# Patient Record
Sex: Male | Born: 1945
Health system: Southern US, Community
[De-identification: ages and names within clinical notes are randomized; demographics above are authoritative.]

## PROBLEM LIST (undated history)

## (undated) DIAGNOSIS — E7800 Pure hypercholesterolemia, unspecified: Secondary | ICD-10-CM

## (undated) DIAGNOSIS — G43909 Migraine, unspecified, not intractable, without status migrainosus: Secondary | ICD-10-CM

## (undated) DIAGNOSIS — I7300 Raynaud's syndrome without gangrene: Secondary | ICD-10-CM

## (undated) DIAGNOSIS — M3320 Polymyositis, organ involvement unspecified: Secondary | ICD-10-CM

## (undated) DIAGNOSIS — R251 Tremor, unspecified: Secondary | ICD-10-CM

## (undated) HISTORY — DX: Tremor, unspecified: R25.1

## (undated) HISTORY — DX: Polymyositis, organ involvement unspecified: M33.20

## (undated) HISTORY — DX: Raynaud's syndrome without gangrene: I73.00

## (undated) HISTORY — PX: WRIST SURGERY: SHX841

## (undated) HISTORY — DX: Migraine, unspecified, not intractable, without status migrainosus: G43.909

## (undated) HISTORY — DX: Pure hypercholesterolemia, unspecified: E78.00

## (undated) HISTORY — PX: INGUINAL HERNIA REPAIR: SHX194

## (undated) HISTORY — PX: CARPAL TUNNEL RELEASE: SHX101

---

## 2000-11-11 ENCOUNTER — Encounter: Admission: RE | Admit: 2000-11-11 | Discharge: 2000-11-11 | Payer: Self-pay | Admitting: Internal Medicine

## 2000-11-11 ENCOUNTER — Encounter: Payer: Self-pay | Admitting: Internal Medicine

## 2000-11-24 ENCOUNTER — Ambulatory Visit (HOSPITAL_COMMUNITY): Admission: RE | Admit: 2000-11-24 | Discharge: 2000-11-24 | Payer: Self-pay | Admitting: Internal Medicine

## 2000-11-24 ENCOUNTER — Encounter: Payer: Self-pay | Admitting: Internal Medicine

## 2000-11-30 ENCOUNTER — Ambulatory Visit (HOSPITAL_BASED_OUTPATIENT_CLINIC_OR_DEPARTMENT_OTHER): Admission: RE | Admit: 2000-11-30 | Discharge: 2000-11-30 | Payer: Self-pay | Admitting: General Surgery

## 2002-08-01 ENCOUNTER — Ambulatory Visit (HOSPITAL_BASED_OUTPATIENT_CLINIC_OR_DEPARTMENT_OTHER): Admission: RE | Admit: 2002-08-01 | Discharge: 2002-08-01 | Payer: Self-pay | Admitting: Orthopedic Surgery

## 2004-12-29 ENCOUNTER — Ambulatory Visit: Payer: Self-pay | Admitting: Family Medicine

## 2011-04-23 DIAGNOSIS — E7800 Pure hypercholesterolemia, unspecified: Secondary | ICD-10-CM | POA: Diagnosis not present

## 2011-05-25 DIAGNOSIS — M3320 Polymyositis, organ involvement unspecified: Secondary | ICD-10-CM | POA: Diagnosis not present

## 2011-07-21 DIAGNOSIS — M3320 Polymyositis, organ involvement unspecified: Secondary | ICD-10-CM | POA: Diagnosis not present

## 2011-09-22 DIAGNOSIS — I7300 Raynaud's syndrome without gangrene: Secondary | ICD-10-CM | POA: Diagnosis not present

## 2011-09-22 DIAGNOSIS — M3320 Polymyositis, organ involvement unspecified: Secondary | ICD-10-CM | POA: Diagnosis not present

## 2011-09-23 DIAGNOSIS — D235 Other benign neoplasm of skin of trunk: Secondary | ICD-10-CM | POA: Diagnosis not present

## 2011-09-23 DIAGNOSIS — C44320 Squamous cell carcinoma of skin of unspecified parts of face: Secondary | ICD-10-CM | POA: Diagnosis not present

## 2011-09-23 DIAGNOSIS — L538 Other specified erythematous conditions: Secondary | ICD-10-CM | POA: Diagnosis not present

## 2011-09-23 DIAGNOSIS — L570 Actinic keratosis: Secondary | ICD-10-CM | POA: Diagnosis not present

## 2011-11-09 DIAGNOSIS — I10 Essential (primary) hypertension: Secondary | ICD-10-CM | POA: Diagnosis not present

## 2011-11-09 DIAGNOSIS — E7800 Pure hypercholesterolemia, unspecified: Secondary | ICD-10-CM | POA: Diagnosis not present

## 2011-11-12 DIAGNOSIS — M353 Polymyalgia rheumatica: Secondary | ICD-10-CM | POA: Diagnosis not present

## 2011-11-12 DIAGNOSIS — E7800 Pure hypercholesterolemia, unspecified: Secondary | ICD-10-CM | POA: Diagnosis not present

## 2011-11-12 DIAGNOSIS — Z0000 Encounter for general adult medical examination without abnormal findings: Secondary | ICD-10-CM | POA: Diagnosis not present

## 2011-11-30 DIAGNOSIS — M3320 Polymyositis, organ involvement unspecified: Secondary | ICD-10-CM | POA: Diagnosis not present

## 2012-02-08 DIAGNOSIS — M3320 Polymyositis, organ involvement unspecified: Secondary | ICD-10-CM | POA: Diagnosis not present

## 2012-02-08 DIAGNOSIS — I7300 Raynaud's syndrome without gangrene: Secondary | ICD-10-CM | POA: Diagnosis not present

## 2012-05-12 DIAGNOSIS — Z125 Encounter for screening for malignant neoplasm of prostate: Secondary | ICD-10-CM | POA: Diagnosis not present

## 2012-05-12 DIAGNOSIS — I10 Essential (primary) hypertension: Secondary | ICD-10-CM | POA: Diagnosis not present

## 2012-05-12 DIAGNOSIS — E7800 Pure hypercholesterolemia, unspecified: Secondary | ICD-10-CM | POA: Diagnosis not present

## 2012-05-18 DIAGNOSIS — E7800 Pure hypercholesterolemia, unspecified: Secondary | ICD-10-CM | POA: Diagnosis not present

## 2012-05-18 DIAGNOSIS — M3320 Polymyositis, organ involvement unspecified: Secondary | ICD-10-CM | POA: Diagnosis not present

## 2012-05-18 DIAGNOSIS — N529 Male erectile dysfunction, unspecified: Secondary | ICD-10-CM | POA: Diagnosis not present

## 2012-07-11 DIAGNOSIS — I7300 Raynaud's syndrome without gangrene: Secondary | ICD-10-CM | POA: Diagnosis not present

## 2012-07-11 DIAGNOSIS — M3320 Polymyositis, organ involvement unspecified: Secondary | ICD-10-CM | POA: Diagnosis not present

## 2012-10-03 DIAGNOSIS — M3320 Polymyositis, organ involvement unspecified: Secondary | ICD-10-CM | POA: Diagnosis not present

## 2012-10-03 DIAGNOSIS — I7300 Raynaud's syndrome without gangrene: Secondary | ICD-10-CM | POA: Diagnosis not present

## 2012-11-09 DIAGNOSIS — E7800 Pure hypercholesterolemia, unspecified: Secondary | ICD-10-CM | POA: Diagnosis not present

## 2012-11-09 DIAGNOSIS — M3320 Polymyositis, organ involvement unspecified: Secondary | ICD-10-CM | POA: Diagnosis not present

## 2012-11-14 DIAGNOSIS — M3320 Polymyositis, organ involvement unspecified: Secondary | ICD-10-CM | POA: Diagnosis not present

## 2012-11-14 DIAGNOSIS — E7800 Pure hypercholesterolemia, unspecified: Secondary | ICD-10-CM | POA: Diagnosis not present

## 2012-11-14 DIAGNOSIS — Z125 Encounter for screening for malignant neoplasm of prostate: Secondary | ICD-10-CM | POA: Diagnosis not present

## 2012-11-14 DIAGNOSIS — I10 Essential (primary) hypertension: Secondary | ICD-10-CM | POA: Diagnosis not present

## 2012-11-15 DIAGNOSIS — E7800 Pure hypercholesterolemia, unspecified: Secondary | ICD-10-CM | POA: Diagnosis not present

## 2012-11-15 DIAGNOSIS — M3320 Polymyositis, organ involvement unspecified: Secondary | ICD-10-CM | POA: Diagnosis not present

## 2013-01-03 DIAGNOSIS — M3320 Polymyositis, organ involvement unspecified: Secondary | ICD-10-CM | POA: Diagnosis not present

## 2013-01-03 DIAGNOSIS — I7300 Raynaud's syndrome without gangrene: Secondary | ICD-10-CM | POA: Diagnosis not present

## 2013-03-06 DIAGNOSIS — C44319 Basal cell carcinoma of skin of other parts of face: Secondary | ICD-10-CM | POA: Diagnosis not present

## 2013-03-06 DIAGNOSIS — L570 Actinic keratosis: Secondary | ICD-10-CM | POA: Diagnosis not present

## 2013-03-06 DIAGNOSIS — L738 Other specified follicular disorders: Secondary | ICD-10-CM | POA: Diagnosis not present

## 2013-04-04 DIAGNOSIS — R05 Cough: Secondary | ICD-10-CM | POA: Diagnosis not present

## 2013-04-04 DIAGNOSIS — I7300 Raynaud's syndrome without gangrene: Secondary | ICD-10-CM | POA: Diagnosis not present

## 2013-04-04 DIAGNOSIS — M3320 Polymyositis, organ involvement unspecified: Secondary | ICD-10-CM | POA: Diagnosis not present

## 2013-04-04 DIAGNOSIS — J8410 Pulmonary fibrosis, unspecified: Secondary | ICD-10-CM | POA: Diagnosis not present

## 2013-05-10 DIAGNOSIS — R059 Cough, unspecified: Secondary | ICD-10-CM | POA: Diagnosis not present

## 2013-05-10 DIAGNOSIS — R05 Cough: Secondary | ICD-10-CM | POA: Diagnosis not present

## 2013-05-10 DIAGNOSIS — I7300 Raynaud's syndrome without gangrene: Secondary | ICD-10-CM | POA: Diagnosis not present

## 2013-05-10 DIAGNOSIS — Z125 Encounter for screening for malignant neoplasm of prostate: Secondary | ICD-10-CM | POA: Diagnosis not present

## 2013-05-10 DIAGNOSIS — I10 Essential (primary) hypertension: Secondary | ICD-10-CM | POA: Diagnosis not present

## 2013-05-10 DIAGNOSIS — M3320 Polymyositis, organ involvement unspecified: Secondary | ICD-10-CM | POA: Diagnosis not present

## 2013-05-10 DIAGNOSIS — E7800 Pure hypercholesterolemia, unspecified: Secondary | ICD-10-CM | POA: Diagnosis not present

## 2013-05-17 DIAGNOSIS — M3320 Polymyositis, organ involvement unspecified: Secondary | ICD-10-CM | POA: Diagnosis not present

## 2013-05-17 DIAGNOSIS — I7300 Raynaud's syndrome without gangrene: Secondary | ICD-10-CM | POA: Diagnosis not present

## 2013-05-17 DIAGNOSIS — E7800 Pure hypercholesterolemia, unspecified: Secondary | ICD-10-CM | POA: Diagnosis not present

## 2013-05-17 DIAGNOSIS — I10 Essential (primary) hypertension: Secondary | ICD-10-CM | POA: Diagnosis not present

## 2013-07-10 DIAGNOSIS — R059 Cough, unspecified: Secondary | ICD-10-CM | POA: Diagnosis not present

## 2013-07-10 DIAGNOSIS — M3320 Polymyositis, organ involvement unspecified: Secondary | ICD-10-CM | POA: Diagnosis not present

## 2013-07-10 DIAGNOSIS — I7300 Raynaud's syndrome without gangrene: Secondary | ICD-10-CM | POA: Diagnosis not present

## 2013-07-10 DIAGNOSIS — R05 Cough: Secondary | ICD-10-CM | POA: Diagnosis not present

## 2013-10-03 DIAGNOSIS — I7300 Raynaud's syndrome without gangrene: Secondary | ICD-10-CM | POA: Diagnosis not present

## 2013-10-03 DIAGNOSIS — R059 Cough, unspecified: Secondary | ICD-10-CM | POA: Diagnosis not present

## 2013-10-03 DIAGNOSIS — R05 Cough: Secondary | ICD-10-CM | POA: Diagnosis not present

## 2013-10-03 DIAGNOSIS — M3320 Polymyositis, organ involvement unspecified: Secondary | ICD-10-CM | POA: Diagnosis not present

## 2013-11-09 DIAGNOSIS — I10 Essential (primary) hypertension: Secondary | ICD-10-CM | POA: Diagnosis not present

## 2013-11-09 DIAGNOSIS — M3320 Polymyositis, organ involvement unspecified: Secondary | ICD-10-CM | POA: Diagnosis not present

## 2013-11-09 DIAGNOSIS — R7402 Elevation of levels of lactic acid dehydrogenase (LDH): Secondary | ICD-10-CM | POA: Diagnosis not present

## 2013-11-09 DIAGNOSIS — R7401 Elevation of levels of liver transaminase levels: Secondary | ICD-10-CM | POA: Diagnosis not present

## 2013-11-14 DIAGNOSIS — E7800 Pure hypercholesterolemia, unspecified: Secondary | ICD-10-CM | POA: Diagnosis not present

## 2013-11-14 DIAGNOSIS — I10 Essential (primary) hypertension: Secondary | ICD-10-CM | POA: Diagnosis not present

## 2013-11-14 DIAGNOSIS — Z125 Encounter for screening for malignant neoplasm of prostate: Secondary | ICD-10-CM | POA: Diagnosis not present

## 2013-11-14 DIAGNOSIS — M3320 Polymyositis, organ involvement unspecified: Secondary | ICD-10-CM | POA: Diagnosis not present

## 2014-02-01 DIAGNOSIS — I7300 Raynaud's syndrome without gangrene: Secondary | ICD-10-CM | POA: Diagnosis not present

## 2014-02-01 DIAGNOSIS — M3322 Polymyositis with myopathy: Secondary | ICD-10-CM | POA: Diagnosis not present

## 2014-02-06 DIAGNOSIS — M25569 Pain in unspecified knee: Secondary | ICD-10-CM | POA: Diagnosis not present

## 2014-02-06 DIAGNOSIS — M25561 Pain in right knee: Secondary | ICD-10-CM | POA: Diagnosis not present

## 2014-02-06 DIAGNOSIS — S8991XA Unspecified injury of right lower leg, initial encounter: Secondary | ICD-10-CM | POA: Diagnosis not present

## 2014-05-14 DIAGNOSIS — E780 Pure hypercholesterolemia: Secondary | ICD-10-CM | POA: Diagnosis not present

## 2014-05-14 DIAGNOSIS — Z125 Encounter for screening for malignant neoplasm of prostate: Secondary | ICD-10-CM | POA: Diagnosis not present

## 2014-05-14 DIAGNOSIS — Z0000 Encounter for general adult medical examination without abnormal findings: Secondary | ICD-10-CM | POA: Diagnosis not present

## 2014-05-14 DIAGNOSIS — M3320 Polymyositis, organ involvement unspecified: Secondary | ICD-10-CM | POA: Diagnosis not present

## 2014-05-17 DIAGNOSIS — E780 Pure hypercholesterolemia: Secondary | ICD-10-CM | POA: Diagnosis not present

## 2014-05-17 DIAGNOSIS — M3320 Polymyositis, organ involvement unspecified: Secondary | ICD-10-CM | POA: Diagnosis not present

## 2014-05-17 DIAGNOSIS — R230 Cyanosis: Secondary | ICD-10-CM | POA: Diagnosis not present

## 2014-06-05 DIAGNOSIS — M3322 Polymyositis with myopathy: Secondary | ICD-10-CM | POA: Diagnosis not present

## 2014-06-05 DIAGNOSIS — Z7952 Long term (current) use of systemic steroids: Secondary | ICD-10-CM | POA: Diagnosis not present

## 2014-06-05 DIAGNOSIS — I7300 Raynaud's syndrome without gangrene: Secondary | ICD-10-CM | POA: Diagnosis not present

## 2014-07-04 DIAGNOSIS — X32XXXD Exposure to sunlight, subsequent encounter: Secondary | ICD-10-CM | POA: Diagnosis not present

## 2014-07-04 DIAGNOSIS — L02821 Furuncle of head [any part, except face]: Secondary | ICD-10-CM | POA: Diagnosis not present

## 2014-07-04 DIAGNOSIS — B9689 Other specified bacterial agents as the cause of diseases classified elsewhere: Secondary | ICD-10-CM | POA: Diagnosis not present

## 2014-07-04 DIAGNOSIS — L570 Actinic keratosis: Secondary | ICD-10-CM | POA: Diagnosis not present

## 2014-07-04 DIAGNOSIS — D225 Melanocytic nevi of trunk: Secondary | ICD-10-CM | POA: Diagnosis not present

## 2014-07-04 DIAGNOSIS — L309 Dermatitis, unspecified: Secondary | ICD-10-CM | POA: Diagnosis not present

## 2014-09-03 DIAGNOSIS — I7300 Raynaud's syndrome without gangrene: Secondary | ICD-10-CM | POA: Diagnosis not present

## 2014-09-03 DIAGNOSIS — Z7952 Long term (current) use of systemic steroids: Secondary | ICD-10-CM | POA: Diagnosis not present

## 2014-09-03 DIAGNOSIS — M3322 Polymyositis with myopathy: Secondary | ICD-10-CM | POA: Diagnosis not present

## 2014-11-12 DIAGNOSIS — E780 Pure hypercholesterolemia: Secondary | ICD-10-CM | POA: Diagnosis not present

## 2014-11-12 DIAGNOSIS — M3320 Polymyositis, organ involvement unspecified: Secondary | ICD-10-CM | POA: Diagnosis not present

## 2014-11-15 DIAGNOSIS — I7300 Raynaud's syndrome without gangrene: Secondary | ICD-10-CM | POA: Diagnosis not present

## 2014-11-15 DIAGNOSIS — E780 Pure hypercholesterolemia: Secondary | ICD-10-CM | POA: Diagnosis not present

## 2014-11-15 DIAGNOSIS — M3322 Polymyositis with myopathy: Secondary | ICD-10-CM | POA: Diagnosis not present

## 2014-11-15 DIAGNOSIS — R251 Tremor, unspecified: Secondary | ICD-10-CM | POA: Diagnosis not present

## 2014-12-03 ENCOUNTER — Ambulatory Visit (INDEPENDENT_AMBULATORY_CARE_PROVIDER_SITE_OTHER): Payer: Medicare Other | Admitting: Neurology

## 2014-12-03 ENCOUNTER — Encounter: Payer: Self-pay | Admitting: Neurology

## 2014-12-03 VITALS — BP 109/73 | HR 66 | Ht 70.0 in | Wt 173.0 lb

## 2014-12-03 DIAGNOSIS — G20 Parkinson's disease: Secondary | ICD-10-CM

## 2014-12-03 MED ORDER — RASAGILINE MESYLATE 1 MG PO TABS: 1.0000 mg | ORAL_TABLET | Freq: Every day | ORAL | Status: DC

## 2014-12-03 NOTE — Patient Instructions (Signed)
Alexander Thomas  Parkinson Disease Parkinson disease is a disorder of the central nervous system, which includes the brain and spinal cord. A person with this disease slowly loses the ability to completely control body movements. Within the brain, there is a group of nerve cells (basal ganglia) that help control movement. The basal ganglia are damaged and do not work properly in a person with Parkinson disease. In addition, the basal ganglia produce and use a brain chemical called dopamine. The dopamine chemical sends messages to other parts of the body to control and coordinate body movements. Dopamine levels are low in a person with Parkinson disease. If the dopamine levels are low, then the body does not receive the correct messages it needs to move normally.  CAUSES  The exact reason why the basal ganglia get damaged is not known. Some medical researchers have thought that infection, genes, environment, and certain medicines may contribute to the cause.  SYMPTOMS   An early symptom of Parkinson disease is often an uncontrolled shaking (tremor) of the hands. The tremor will often disappear when the affected hand is consciously used.  As the disease progresses, walking, talking, getting out of a chair, and new movements become more difficult.  Muscles get stiff and movements become slower.  Balance and coordination become harder.  Depression, trouble swallowing, urinary problems, constipation, and sleep problems can occur.  Later in the disease, memory and thought processes may deteriorate. DIAGNOSIS  There are no specific tests to diagnose Parkinson disease. You may be referred to a neurologist for evaluation. Your caregiver will ask about your medical history, symptoms, and perform a physical exam. Blood tests and imaging tests of your brain may be performed to rule out other diseases. The imaging tests may include an MRI or a CT scan. TREATMENT  The goal of treatment is to  relieve symptoms. Medicines may be prescribed once the symptoms become troublesome. Medicine will not stop the progression of the disease, but medicine can make movement and balance better and help control tremors. Speech and occupational therapy may also be prescribed. Sometimes, surgical treatment of the brain can be done in young people. HOME CARE INSTRUCTIONS  Get regular exercise and rest periods during the day to help prevent exhaustion and depression.  If getting dressed becomes difficult, replace buttons and zippers with Velcro and elastic on your clothing.  Take all medicine as directed by your caregiver.  Install grab bars or railings in your home to prevent falls.  Go to speech or occupational therapy as directed.  Keep all follow-up visits as directed by your caregiver. SEEK MEDICAL CARE IF:  Your symptoms are not controlled with your medicine.  You fall.  You have trouble swallowing or choke on your food. MAKE SURE YOU:  Understand these instructions.  Will watch your condition.  Will get help right away if you are not doing well or get worse. Document Released: 04/03/2000 Document Revised: 08/01/2012 Document Reviewed: 05/06/2011 [facility] Patient Information 2015 [place], [place]. This information is not intended to replace advice given to you by your health care provider. Make sure you discuss any questions you have with your health care provider.

## 2014-12-03 NOTE — Progress Notes (Signed)
PATIENT: Alexander Thomas DOB: 1945/12/26  Chief Complaint  Patient presents with  . Tremors    He is here with his wife, Daylene Katayama, to discuss the tremors in his right-hand.  Reports symptoms have been getting worse over the last two years.     HISTORICAL  Alexander Thomas is a 69 years old right-handed male, accompanied by his wife, seen in refer by his primary care physician Dr. Jani Gravel for evaluation of right hand tremor  I have reviewed and summarized his most recent office note He had a past medical history of hyperlipidemia, Raynaud's disease,, had left carpal tunnel release surgery, right wrist fracture surgery, polymyositis,  left deltoid muscle biopsy consistent with fiber atrophy, paravasculitis in 2002, this was done in August 2002, at its worst, he has difficulty standing up, need wife help him dressing, he has taken methotrexate along with low-dose prednisone for many years, in 2015, he was switched to Imuran 50 mg twice a day, most recent CPK level was 83, he denies significant muscle weakness,  I also reviewed laboratory results in July 2016, normal CMP, CBC, CPK 83, mild elevated LDL 134, normal ESR 6, hepatitis panel was negative  He is a retired Company secretary, since 2014, he noticed mild right hand tremor, getting worse gradually, there was no significant gait difficulty, mild left of facial expression, he has lack of smell, which has been a chronic issues, he denies REM sleep disorder, mild constipations.  REVIEW OF SYSTEMS: Full 14 system review of systems performed and notable only for tremor  ALLERGIES: No Known Allergies  HOME MEDICATIONS: Current Outpatient Prescriptions  Medication Sig Dispense Refill  . azaTHIOprine (IMURAN) 50 MG tablet Take 100 mg by mouth daily.  3  . doxycycline (VIBRAMYCIN) 100 MG capsule TAKE ONE CAPSULE BY MOUTH UP TO TWICE DAY PRN WITH FOOD/ WATER. MAKES SKIN SENSITIVE TO SUN  3  . predniSONE (DELTASONE) 1 MG tablet Take 1 mg by mouth  daily with breakfast.    . sildenafil (VIAGRA) 50 MG tablet Take 50 mg by mouth as needed for erectile dysfunction.       PAST MEDICAL HISTORY: Past Medical History  Diagnosis Date  . Polymyositis   . Raynaud disease   . Hypercholesteremia   . Migraine   . Tremor of right hand     PAST SURGICAL HISTORY: Past Surgical History  Procedure Laterality Date  . Carpal tunnel release Left   . Inguinal hernia repair Bilateral   . Wrist surgery Right     FAMILY HISTORY: Family History  Problem Relation Age of Onset  . Uterine cancer Mother   . Healthy Father   . Kidney cancer Brother   . Colon cancer Brother     SOCIAL HISTORY:  Social History   Social History  . Marital Status: Married    Spouse Name: N/A  . Number of Children: 2  . Years of Education: PhD   Occupational History  . Minister    Social History Main Topics  . Smoking status: Never Smoker   . Smokeless tobacco: Not on file  . Alcohol Use: No  . Drug Use: No  . Sexual Activity: Not on file   Other Topics Concern  . Not on file   Social History Narrative   Lives at home with his wife.   Right-handed.   1 cup caffeine daily.     PHYSICAL EXAM   Filed Vitals:   12/03/14 1446  BP: 109/73  Pulse:  66  Height: 5' 10"  (1.778 m)  Weight: 173 lb (78.472 kg)    Not recorded      Body mass index is 24.82 kg/(m^2).  PHYSICAL EXAMNIATION:  Gen: NAD, conversant, well nourised, obese, well groomed                     Cardiovascular: Regular rate rhythm, no peripheral edema, warm, nontender. Eyes: Conjunctivae clear without exudates or hemorrhage Neck: Supple, no carotid bruise. Pulmonary: Clear to auscultation bilaterally   NEUROLOGICAL EXAM:  MENTAL STATUS: Speech:    Speech is normal; fluent and spontaneous with normal comprehension.  Cognition:     Orientation to time, place and person     Normal recent and remote memory     Normal Attention span and concentration     Normal  Language, naming, repeating,spontaneous speech     Fund of knowledge   CRANIAL NERVES: CN II: Visual fields are full to confrontation. Fundoscopic exam is normal with sharp discs and no vascular changes. Pupils are round equal and briskly reactive to light. CN III, IV, VI: extraocular movement are normal. No ptosis. CN V: Facial sensation is intact to pinprick in all 3 divisions bilaterally. Corneal responses are intact.  CN VII: Face is symmetric with normal eye closure and smile. CN VIII: Hearing is normal to rubbing fingers CN IX, X: Palate elevates symmetrically. Phonation is normal. CN XI: Head turning and shoulder shrug are intact CN XII: Tongue is midline with normal movements and no atrophy.  MOTOR: Right hand resting tremor, moderate more than left side rigidity, bradykinesia, mild nuchal rigidity  REFLEXES: Reflexes are 2+ and symmetric at the biceps, triceps, knees, and ankles. Plantar responses are flexor.  SENSORY: Intact to light touch, pinprick, position sense, and vibration sense are intact in fingers and toes.  COORDINATION: Rapid alternating movements and fine finger movements are intact. There is no dysmetria on finger-to-nose and heel-knee-shin.    GAIT/STANCE: Posture is normal. Gait is steady with normal steps, base, decreased right arm swing, slight retropulsed instability .  Romberg is absent.   DIAGNOSTIC DATA (LABS, IMAGING, TESTING) - I reviewed patient records, labs, notes, testing and imaging myself where available.   ASSESSMENT AND PLAN  Alexander Thomas is a 69 y.o. male    Idiopathic Parkinson's disease  MRI of brain  Azilect 1 mg daily  I have encouraged him moderate exercise History of polymyositis   no weakness, well controlled on Imuran 50 mg twice a day, prednisone 1 mg every day, most recent CPK 83   Marcial Pacas, M.D. Ph.D.  [facility] [address], [place], [place] [address] Ph: [phone] Fax:  [phone]  CC: Dr. Jani Gravel

## 2014-12-10 ENCOUNTER — Ambulatory Visit
Admission: RE | Admit: 2014-12-10 | Discharge: 2014-12-10 | Disposition: A | Discharge status: Home / Self Care | Payer: Medicare Other | Source: Ambulatory Visit | Attending: Neurology | Admitting: Neurology

## 2014-12-10 ENCOUNTER — Telehealth: Payer: Self-pay | Admitting: Neurology

## 2014-12-10 DIAGNOSIS — G20 Parkinson's disease: Secondary | ICD-10-CM | POA: Diagnosis not present

## 2014-12-10 DIAGNOSIS — G20A1 Parkinson's disease without dyskinesia, without mention of fluctuations: Secondary | ICD-10-CM

## 2014-12-10 DIAGNOSIS — R9082 White matter disease, unspecified: Secondary | ICD-10-CM | POA: Diagnosis not present

## 2014-12-10 NOTE — Telephone Encounter (Signed)
Michelle: Please call patient MRI of the brain without contrast showed mild age-related changes, I will reviewed detail with him at his next follow-up visit   IMPRESSION: This MRI of the brain without contrast shows a few scattered T2/FLAIR hyperintense foci in the white matter. This is a nonspecific finding and could be idiopathic or due to mild age related chronic microvascular ischemic changes. Demyelination and vasculitis would be less likely.

## 2014-12-11 ENCOUNTER — Other Ambulatory Visit: Payer: Self-pay

## 2014-12-11 NOTE — Telephone Encounter (Signed)
Spoke to patient - he is aware of the results. 

## 2015-02-04 ENCOUNTER — Ambulatory Visit: Payer: Self-pay | Admitting: Neurology

## 2015-02-04 ENCOUNTER — Ambulatory Visit (INDEPENDENT_AMBULATORY_CARE_PROVIDER_SITE_OTHER): Payer: Medicare Other | Admitting: Neurology

## 2015-02-04 ENCOUNTER — Encounter: Payer: Self-pay | Admitting: Neurology

## 2015-02-04 VITALS — BP 112/70 | HR 68 | Ht 70.0 in | Wt 175.0 lb

## 2015-02-04 DIAGNOSIS — M3322 Polymyositis with myopathy: Secondary | ICD-10-CM

## 2015-02-04 DIAGNOSIS — G20 Parkinson's disease: Secondary | ICD-10-CM

## 2015-02-04 MED ORDER — ROPINIROLE HCL ER 2 MG PO TB24: ORAL_TABLET | ORAL | Status: DC

## 2015-02-04 NOTE — Progress Notes (Signed)
Chief Complaint  Patient presents with  . Parkinson's Disease    He is here with his wife, Alexander Thomas.  They would like to review his MRI.  He has not noticed any improvment of his symptoms with Azilect after nine weeks of use.      PATIENT: Alexander Thomas DOB: Feb 13, 1946  Chief Complaint  Patient presents with  . Parkinson's Disease    He is here with his wife, Alexander Thomas.  They would like to review his MRI.  He has not noticed any improvment of his symptoms with Azilect after nine weeks of use.     HISTORICAL  Alexander Thomas is a 69 years old right-handed male, accompanied by his wife, seen in refer by his primary care physician Dr. Jani Gravel for evaluation of right hand tremor  I have reviewed and summarized his most recent office note He had a past medical history of hyperlipidemia, Raynaud's disease,, had left carpal tunnel release surgery, right wrist fracture surgery, polymyositis,  left deltoid muscle biopsy consistent with fiber atrophy, paravasculitis in 2002, this was done in August 2002, at its worst, he has difficulty standing up, need wife help him dressing, he has taken methotrexate along with low-dose prednisone for many years, in 2015, he was switched to Imuran 50 mg twice a day, most recent CPK level was 83, he denies significant muscle weakness,  I also reviewed laboratory results in July 2016, normal CMP, CBC, CPK 83, mild elevated LDL 134, normal ESR 6, hepatitis panel was negative  He is a retired Company secretary, since 2014, he noticed mild right hand tremor, getting worse gradually, there was no significant gait difficulty, mild lack of facial expression, he has lack of smell, which has been a chronic issues, he denies REM sleep disorder, mild constipations.  Update February 04 2015: He has no side effect with Azilect, does not notice any symptomatic improvement either,  We have reviewed MRI of the brain without contrast, mild supratentorium small vessel disease, no acute  lesions.  REVIEW OF SYSTEMS: Full 14 system review of systems performed and notable only for tremor  ALLERGIES: No Known Allergies  HOME MEDICATIONS: Current Outpatient Prescriptions  Medication Sig Dispense Refill  . azaTHIOprine (IMURAN) 50 MG tablet Take 100 mg by mouth daily.  3  . doxycycline (VIBRAMYCIN) 100 MG capsule TAKE ONE CAPSULE BY MOUTH UP TO TWICE DAY PRN WITH FOOD/ WATER. MAKES SKIN SENSITIVE TO SUN  3  . predniSONE (DELTASONE) 1 MG tablet Take 1 mg by mouth daily with breakfast.    . sildenafil (VIAGRA) 50 MG tablet Take 50 mg by mouth as needed for erectile dysfunction.       PAST MEDICAL HISTORY: Past Medical History  Diagnosis Date  . Polymyositis ([place])   . Raynaud disease   . Hypercholesteremia   . Migraine   . Tremor of right hand     PAST SURGICAL HISTORY: Past Surgical History  Procedure Laterality Date  . Carpal tunnel release Left   . Inguinal hernia repair Bilateral   . Wrist surgery Right     FAMILY HISTORY: Family History  Problem Relation Age of Onset  . Uterine cancer Mother   . Healthy Father   . Kidney cancer Brother   . Colon cancer Brother     SOCIAL HISTORY:  Social History   Social History  . Marital Status: Married    Spouse Name: N/A  . Number of Children: 2  . Years of Education: PhD   Occupational  History  . Minister    Social History Main Topics  . Smoking status: Never Smoker   . Smokeless tobacco: Not on file  . Alcohol Use: No  . Drug Use: No  . Sexual Activity: Not on file   Other Topics Concern  . Not on file   Social History Narrative   Lives at home with his wife.   Right-handed.   1 cup caffeine daily.     PHYSICAL EXAM   Filed Vitals:   02/04/15 1140  BP: 112/70  Pulse: 68  Height: 5' 10"  (1.778 m)  Weight: 175 lb (79.379 kg)    Not recorded      Body mass index is 25.11 kg/(m^2).  PHYSICAL EXAMNIATION:  Gen: NAD, conversant, well nourised, obese, well groomed                      Cardiovascular: Regular rate rhythm, no peripheral edema, warm, nontender. Eyes: Conjunctivae clear without exudates or hemorrhage Neck: Supple, no carotid bruise. Pulmonary: Clear to auscultation bilaterally   NEUROLOGICAL EXAM:  MENTAL STATUS: Speech:    Speech is normal; fluent and spontaneous with normal comprehension.  Cognition:     Orientation to time, place and person     Normal recent and remote memory     Normal Attention span and concentration     Normal Language, naming, repeating,spontaneous speech     Fund of knowledge   CRANIAL NERVES: CN II: Visual fields are full to confrontation. Fundoscopic exam is normal with sharp discs and no vascular changes. Pupils are round equal and briskly reactive to light. CN III, IV, VI: extraocular movement are normal. No ptosis. CN V: Facial sensation is intact to pinprick in all 3 divisions bilaterally. Corneal responses are intact.  CN VII: Face is symmetric with normal eye closure and smile. CN VIII: Hearing is normal to rubbing fingers CN IX, X: Palate elevates symmetrically. Phonation is normal. CN XI: Head turning and shoulder shrug are intact CN XII: Tongue is midline with normal movements and no atrophy.  MOTOR: Right hand resting tremor, moderate more than left side rigidity, bradykinesia, mild nuchal rigidity, he has no weakness  REFLEXES: Reflexes are 2+ and symmetric at the biceps, triceps, knees, and ankles. Plantar responses are flexor.  SENSORY: Intact to light touch, pinprick, position sense, and vibration sense are intact in fingers and toes.  COORDINATION: Rapid alternating movements and fine finger movements are intact. There is no dysmetria on finger-to-nose and heel-knee-shin.    GAIT/STANCE: Posture is normal. Gait is steady with normal steps, base, decreased right arm swing, increased right hand tremor while ambulating, slight retropulsed instability .  Romberg is absent.   DIAGNOSTIC DATA (LABS,  IMAGING, TESTING) - I reviewed patient records, labs, notes, testing and imaging myself where available.   ASSESSMENT AND PLAN  Alexander Thomas is a 69 y.o. male    Idiopathic Parkinson's disease  Azilect 1 mg daily  Add on Requip XL 26m up to 2 tabs po qhs.  I have encouraged him moderate exercise  History of polymyositis   no weakness, well controlled on Imuran 50 mg twice a day, prednisone 1 mg every day, most recent CPK 83  If continues to do well, will consider tapering him of prednisone.  YMarcial Pacas M.D. Ph.D.  [facility] [address] [place] [address]Ph: [phone]: [phone] CC: Dr. JJani Gravel

## 2015-04-08 ENCOUNTER — Telehealth: Payer: Self-pay | Admitting: Neurology

## 2015-04-08 NOTE — Telephone Encounter (Signed)
Patient is calling and states his Rx Rasagiline 1 mg cost him $193 copay today.  Is there another medication that would work for him.  Please call.

## 2015-04-08 NOTE — Telephone Encounter (Signed)
I called back and spoke with the patient.  Asked that we disregard the call.  Says he found out he is in the donut hole, which caused the price to increase this month.  He will call us back after the beginning of the year if the cost does not reduce to discuss other options.

## 2015-04-29 DIAGNOSIS — I781 Nevus, non-neoplastic: Secondary | ICD-10-CM | POA: Diagnosis not present

## 2015-04-29 DIAGNOSIS — L820 Inflamed seborrheic keratosis: Secondary | ICD-10-CM | POA: Diagnosis not present

## 2015-04-29 DIAGNOSIS — D225 Melanocytic nevi of trunk: Secondary | ICD-10-CM | POA: Diagnosis not present

## 2015-04-29 DIAGNOSIS — L259 Unspecified contact dermatitis, unspecified cause: Secondary | ICD-10-CM | POA: Diagnosis not present

## 2015-05-07 ENCOUNTER — Encounter: Payer: Self-pay | Admitting: Neurology

## 2015-05-07 ENCOUNTER — Ambulatory Visit (INDEPENDENT_AMBULATORY_CARE_PROVIDER_SITE_OTHER): Payer: Medicare Other | Admitting: Neurology

## 2015-05-07 VITALS — BP 116/71 | HR 73 | Ht 70.0 in | Wt 179.0 lb

## 2015-05-07 DIAGNOSIS — G20 Parkinson's disease: Secondary | ICD-10-CM | POA: Diagnosis not present

## 2015-05-07 DIAGNOSIS — M3320 Polymyositis, organ involvement unspecified: Secondary | ICD-10-CM | POA: Diagnosis not present

## 2015-05-07 MED ORDER — ROPINIROLE HCL ER 4 MG PO TB24: 8.0000 mg | ORAL_TABLET | Freq: Every day | ORAL | Status: DC

## 2015-05-07 NOTE — Progress Notes (Signed)
Chief Complaint  Patient presents with  . Parkinson's Disease    Feels his right-hand tremor is still a problem for him.  His left-hand is better.  He is currently taking Azilect 60m qd and Requip XL 466mqhs.   Chief Complaint  Patient presents with  . Parkinson's Disease    Feels his right-hand tremor is still a problem for him.  His left-hand is better.  He is currently taking Azilect 6m72md and Requip XL 4mg50ms.      PATIENT: Alexander Thomas: [birth year]ief Complaint  Patient presents with  . Parkinson's Disease    Feels his right-hand tremor is still a problem for him.  His left-hand is better.  He is currently taking Azilect 6mg 76mand Requip XL 4mg q28m     HISTORICAL  Alexander CORDREY68 yea85 old right-handed male, accompanied by his wife, seen in refer by his primary care physician Dr. James Jani Gravelvaluation of right hand tremor  I have reviewed and summarized his most recent office note He had a past medical history of hyperlipidemia, Raynaud's disease,, had left carpal tunnel release surgery, right wrist fracture surgery, polymyositis,  left deltoid muscle biopsy consistent with fiber atrophy, paravasculitis in 2002, this was done in August 2002, at its worst, he has difficulty standing up, need wife help him dressing, he has taken methotrexate along with low-dose prednisone for many years, in 2015, he was switched to Imuran 50 mg twice a day, most recent CPK level was 83, he denies significant muscle weakness,  I also reviewed laboratory results in July 2016, normal CMP, CBC, CPK 83, mild elevated LDL 134, normal ESR 6, hepatitis panel was negative  He is a retired ministCompany secretarye 2014, he noticed mild right hand tremor, getting worse gradually, there was no significant gait difficulty, mild lack of facial expression, he has lack of smell, which has been a chronic issues, he denies REM sleep disorder, mild constipations.  Update February 04 2015: He has no  side effect with Azilect, does not notice any symptomatic improvement either,  We have reviewed MRI of the brain without contrast, mild supratentorium small vessel disease, no acute lesions.  UPDATE May 07 2015: He still has mild right hand shaking, left hand is less, he is now taking Azilect 1 mg every day, requip xr 2 mg 2 tablets every night at 8pm.  He did not noticed any significant side effect.  It does help his tremor  He denies muscle weakness or muscle pain, he had a history of inflammatory polymyositis  REVIEW OF SYSTEMS: Full 14 system review of systems performed and notable only for tremor  ALLERGIES: No Known Allergies  HOME MEDICATIONS: Current Outpatient Prescriptions  Medication Sig Dispense Refill  . azaTHIOprine (IMURAN) 50 MG tablet Take 100 mg by mouth daily.  3  . doxycycline (VIBRAMYCIN) 100 MG capsule TAKE ONE CAPSULE BY MOUTH UP TO TWICE DAY PRN WITH FOOD/ WATER. MAKES SKIN SENSITIVE TO SUN  3  . predniSONE (DELTASONE) 1 MG tablet Take 1 mg by mouth daily with breakfast.    . sildenafil (VIAGRA) 50 MG tablet Take 50 mg by mouth as needed for erectile dysfunction.       PAST MEDICAL HISTORY: Past Medical History  Diagnosis Date  . Polymyositis ([facility])  EmeraldRaynaud disease   . Hypercholesteremia   . Migraine   . Tremor of right hand     PAST SURGICAL HISTORY: Past  Surgical History  Procedure Laterality Date  . Carpal tunnel release Left   . Inguinal hernia repair Bilateral   . Wrist surgery Right     FAMILY HISTORY: Family History  Problem Relation Age of Onset  . Uterine cancer Mother   . Healthy Father   . Kidney cancer Brother   . Colon cancer Brother     SOCIAL HISTORY:  Social History   Social History  . Marital Status: Married    Spouse Name: N/A  . Number of Children: 2  . Years of Education: PhD   Occupational History  . Minister    Social History Main Topics  . Smoking status: Never Smoker   . Smokeless tobacco: Not on file   . Alcohol Use: No  . Drug Use: No  . Sexual Activity: Not on file   Other Topics Concern  . Not on file   Social History Narrative   Lives at home with his wife.   Right-handed.   1 cup caffeine daily.     PHYSICAL EXAM   Filed Vitals:   05/07/15 1138  BP: 116/71  Pulse: 73  Height: 5' 10"  (1.778 m)  Weight: 179 lb (81.194 kg)    Not recorded      Body mass index is 25.68 kg/(m^2).  PHYSICAL EXAMNIATION:  Gen: NAD, conversant, well nourised, obese, well groomed                     Cardiovascular: Regular rate rhythm, no peripheral edema, warm, nontender. Eyes: Conjunctivae clear without exudates or hemorrhage Neck: Supple, no carotid bruise. Pulmonary: Clear to auscultation bilaterally   NEUROLOGICAL EXAM:  MENTAL STATUS: Speech:    Speech is normal; fluent and spontaneous with normal comprehension.  Cognition:     Orientation to time, place and person     Normal recent and remote memory     Normal Attention span and concentration     Normal Language, naming, repeating,spontaneous speech     Fund of knowledge   CRANIAL NERVES: CN II: Visual fields are full to confrontation.  Pupils are round equal and briskly reactive to light. CN III, IV, VI: extraocular movement are normal. No ptosis. CN V: Facial sensation is intact to pinprick in all 3 divisions bilaterally. Corneal responses are intact.  CN VII: Face is symmetric with normal eye closure and smile. CN VIII: Hearing is normal to rubbing fingers CN IX, X: Palate elevates symmetrically. Phonation is normal. CN XI: Head turning and shoulder shrug are intact CN XII: Tongue is midline with normal movements and no atrophy.  MOTOR: Right hand resting tremor, moderate more than left side rigidity, bradykinesia, mild nuchal rigidity, he has no weakness  REFLEXES: Reflexes are 2+ and symmetric at the biceps, triceps, knees, and ankles. Plantar responses are flexor.  SENSORY: Intact to light touch,  pinprick, position sense, and vibration sense are intact in fingers and toes.  COORDINATION: Rapid alternating movements and fine finger movements are intact. There is no dysmetria on finger-to-nose and heel-knee-shin.    GAIT/STANCE: Posture is normal. Gait is steady with normal steps, base, decreased right arm swing, increased right hand tremor while ambulating, slight retropulsed instability .  Romberg is absent.   DIAGNOSTIC DATA (LABS, IMAGING, TESTING) - I reviewed patient records, labs, notes, testing and imaging myself where available.   ASSESSMENT AND PLAN  Alexander Thomas is a 70 y.o. male    Idiopathic Parkinson's disease  Keep Azilect 1 mg daily  increase Requip XL to 5m 2 tabs po qhs.  I have encouraged him moderate exercise  History of polymyositis   no weakness, well controlled on Imuran 50 mg twice a day, prednisone 1 mg every day, most recent CPK 83  If continues to do well, will consider tapering him of prednisone.  YMarcial Pacas M.D. Ph.D.  [facility] [address] [place]  [address]Ph: [phone]: [phone] CC: Dr. JJani Gravel

## 2015-05-14 DIAGNOSIS — I10 Essential (primary) hypertension: Secondary | ICD-10-CM | POA: Diagnosis not present

## 2015-05-14 DIAGNOSIS — Z125 Encounter for screening for malignant neoplasm of prostate: Secondary | ICD-10-CM | POA: Diagnosis not present

## 2015-05-14 DIAGNOSIS — M3320 Polymyositis, organ involvement unspecified: Secondary | ICD-10-CM | POA: Diagnosis not present

## 2015-05-14 DIAGNOSIS — E7800 Pure hypercholesterolemia, unspecified: Secondary | ICD-10-CM | POA: Diagnosis not present

## 2015-05-21 DIAGNOSIS — I4891 Unspecified atrial fibrillation: Secondary | ICD-10-CM | POA: Diagnosis not present

## 2015-05-21 DIAGNOSIS — I10 Essential (primary) hypertension: Secondary | ICD-10-CM | POA: Diagnosis not present

## 2015-05-21 DIAGNOSIS — G20 Parkinson's disease: Secondary | ICD-10-CM | POA: Diagnosis not present

## 2015-05-21 DIAGNOSIS — M3320 Polymyositis, organ involvement unspecified: Secondary | ICD-10-CM | POA: Diagnosis not present

## 2015-06-04 DIAGNOSIS — I480 Paroxysmal atrial fibrillation: Secondary | ICD-10-CM | POA: Insufficient documentation

## 2015-06-04 DIAGNOSIS — Z6826 Body mass index (BMI) 26.0-26.9, adult: Secondary | ICD-10-CM | POA: Diagnosis not present

## 2015-06-04 DIAGNOSIS — I4891 Unspecified atrial fibrillation: Secondary | ICD-10-CM | POA: Diagnosis not present

## 2015-06-10 DIAGNOSIS — X32XXXD Exposure to sunlight, subsequent encounter: Secondary | ICD-10-CM | POA: Diagnosis not present

## 2015-06-10 DIAGNOSIS — L219 Seborrheic dermatitis, unspecified: Secondary | ICD-10-CM | POA: Diagnosis not present

## 2015-06-10 DIAGNOSIS — L0202 Furuncle of face: Secondary | ICD-10-CM | POA: Diagnosis not present

## 2015-06-10 DIAGNOSIS — D045 Carcinoma in situ of skin of trunk: Secondary | ICD-10-CM | POA: Diagnosis not present

## 2015-06-10 DIAGNOSIS — L570 Actinic keratosis: Secondary | ICD-10-CM | POA: Diagnosis not present

## 2015-06-10 DIAGNOSIS — B9689 Other specified bacterial agents as the cause of diseases classified elsewhere: Secondary | ICD-10-CM | POA: Diagnosis not present

## 2015-06-18 DIAGNOSIS — I480 Paroxysmal atrial fibrillation: Secondary | ICD-10-CM | POA: Diagnosis not present

## 2015-06-18 DIAGNOSIS — I4891 Unspecified atrial fibrillation: Secondary | ICD-10-CM | POA: Diagnosis not present

## 2015-07-01 DIAGNOSIS — I7300 Raynaud's syndrome without gangrene: Secondary | ICD-10-CM | POA: Diagnosis not present

## 2015-07-01 DIAGNOSIS — Z79899 Other long term (current) drug therapy: Secondary | ICD-10-CM | POA: Diagnosis not present

## 2015-07-01 DIAGNOSIS — M3322 Polymyositis with myopathy: Secondary | ICD-10-CM | POA: Diagnosis not present

## 2015-09-24 DIAGNOSIS — I7300 Raynaud's syndrome without gangrene: Secondary | ICD-10-CM | POA: Diagnosis not present

## 2015-09-24 DIAGNOSIS — E7800 Pure hypercholesterolemia, unspecified: Secondary | ICD-10-CM | POA: Diagnosis not present

## 2015-09-24 DIAGNOSIS — R251 Tremor, unspecified: Secondary | ICD-10-CM | POA: Diagnosis not present

## 2015-09-24 DIAGNOSIS — M545 Low back pain: Secondary | ICD-10-CM | POA: Diagnosis not present

## 2015-09-24 DIAGNOSIS — M3320 Polymyositis, organ involvement unspecified: Secondary | ICD-10-CM | POA: Diagnosis not present

## 2015-11-04 ENCOUNTER — Encounter: Payer: Self-pay | Admitting: Neurology

## 2015-11-04 ENCOUNTER — Ambulatory Visit (INDEPENDENT_AMBULATORY_CARE_PROVIDER_SITE_OTHER): Payer: Medicare Other | Admitting: Neurology

## 2015-11-04 VITALS — BP 102/64 | HR 72 | Ht 70.0 in | Wt 175.0 lb

## 2015-11-04 DIAGNOSIS — G20A1 Parkinson's disease without dyskinesia, without mention of fluctuations: Secondary | ICD-10-CM

## 2015-11-04 DIAGNOSIS — G20 Parkinson's disease: Secondary | ICD-10-CM | POA: Diagnosis not present

## 2015-11-04 DIAGNOSIS — M3320 Polymyositis, organ involvement unspecified: Secondary | ICD-10-CM

## 2015-11-04 MED ORDER — ROPINIROLE HCL ER 4 MG PO TB24: 16.0000 mg | ORAL_TABLET | Freq: Every day | ORAL | Status: DC

## 2015-11-04 MED ORDER — AZATHIOPRINE 50 MG PO TABS: 100.0000 mg | ORAL_TABLET | Freq: Every day | ORAL | Status: DC

## 2015-11-04 MED ORDER — RASAGILINE MESYLATE 1 MG PO TABS: 1.0000 mg | ORAL_TABLET | Freq: Every day | ORAL | Status: DC

## 2015-11-04 NOTE — Progress Notes (Signed)
Chief Complaint  Patient presents with  . Parkinson's Disease    He is here with his wife, Daylene Katayama.  Says his tremors are well controlled when he takes his medications correctly.  He sometimes forgets to take his Requip at night.  His eating habits have been the same but he has lost five pounds since last seen.         PATIENT: Alexander Thomas DOB: [birth year]  Chief Complaint  Patient presents with  . Parkinson's Disease    He is here with his wife, Daylene Katayama.  Says his tremors are well controlled when he takes his medications correctly.  He sometimes forgets to take his Requip at night.  His eating habits have been the same but he has lost five pounds since last seen.        HISTORICAL  JSHAUN ABERNATHY is a 70 years old right-handed male, accompanied by his wife, seen in refer by his primary care physician Dr. Jani Gravel for evaluation of right hand tremor  I have reviewed and summarized his most recent office note He had a past medical history of hyperlipidemia, Raynaud's disease,, had left carpal tunnel release surgery, right wrist fracture surgery, polymyositis,  left deltoid muscle biopsy consistent with fiber atrophy, paravasculitis in 2002, this was diagnosed in August 2002, at its worst, he has difficulty standing up, need wife help him dressing, he has taken methotrexate along with low-dose prednisone for many years, in 2015, he was switched to Imuran 50 mg twice a day, most recent CPK level was 83, he denies significant muscle weakness,  I also reviewed laboratory results in July 2016, normal CMP, CBC, CPK 83, mild elevated LDL 134, normal ESR 6, hepatitis panel was negative  He is a retired Company secretary, since 2014, he noticed mild right hand tremor, getting worse gradually, there was no significant gait difficulty, mild lack of facial expression, he has lack of smell, which has been a chronic issues, he denies REM sleep disorder, mild constipations.  Update February 04 2015: He has  no side effect with Azilect, does not notice any symptomatic improvement either,  We have reviewed MRI of the brain without contrast, mild supratentorium small vessel disease, no acute lesions.  UPDATE May 07 2015: He still has mild right hand shaking, left hand is less, he is now taking Azilect 1 mg every day, requip xr 2 mg 2 tablets every night at 8pm.  He did not noticed any significant side effect.  It does help his tremor  He denies muscle weakness or muscle pain, he had a history of inflammatory polymyositis  UPDATE July 17th 2017: He is taking Azilect 66m qday, he tolerated requip xL 477m2 tabs po qhs well, he still has tremor, he sleeps well, he lost sense of smell, he has no significant gait abnormality, he has ok appetite, he exercise regularly,  5-10 mintues sit ups at that time, he denies obsessive compulsive behavior.  REVIEW OF SYSTEMS: Full 14 system review of systems performed and notable only for tremor  ALLERGIES: No Known Allergies  HOME MEDICATIONS: Current Outpatient Prescriptions  Medication Sig Dispense Refill  . azaTHIOprine (IMURAN) 50 MG tablet Take 100 mg by mouth daily.  3  . doxycycline (VIBRAMYCIN) 100 MG capsule TAKE ONE CAPSULE BY MOUTH UP TO TWICE DAY PRN WITH FOOD/ WATER. MAKES SKIN SENSITIVE TO SUN  3  . predniSONE (DELTASONE) 1 MG tablet Take 1 mg by mouth daily with breakfast.    . sildenafil (VIAGRA)  50 MG tablet Take 50 mg by mouth as needed for erectile dysfunction.       PAST MEDICAL HISTORY: Past Medical History  Diagnosis Date  . Polymyositis ([place])   . Raynaud disease   . Hypercholesteremia   . Migraine   . Tremor of right hand     PAST SURGICAL HISTORY: Past Surgical History  Procedure Laterality Date  . Carpal tunnel release Left   . Inguinal hernia repair Bilateral   . Wrist surgery Right     FAMILY HISTORY: Family History  Problem Relation Age of Onset  . Uterine cancer Mother   . Healthy Father   . Kidney cancer  Brother   . Colon cancer Brother     SOCIAL HISTORY:  Social History   Social History  . Marital Status: Married    Spouse Name: N/A  . Number of Children: 2  . Years of Education: PhD   Occupational History  . Minister    Social History Main Topics  . Smoking status: Never Smoker   . Smokeless tobacco: Not on file  . Alcohol Use: No  . Drug Use: No  . Sexual Activity: Not on file   Other Topics Concern  . Not on file   Social History Narrative   Lives at home with his wife.   Right-handed.   1 cup caffeine daily.     PHYSICAL EXAM   Filed Vitals:   11/04/15 1432  BP: 102/64  Pulse: 72  Height: 5' 10"  (1.778 m)  Weight: 175 lb (79.379 kg)    Not recorded      Body mass index is 25.11 kg/(m^2).  PHYSICAL EXAMNIATION:  Gen: NAD, conversant, well nourised, obese, well groomed                     Cardiovascular: Regular rate rhythm, no peripheral edema, warm, nontender. Eyes: Conjunctivae clear without exudates or hemorrhage Neck: Supple, no carotid bruise. Pulmonary: Clear to auscultation bilaterally   NEUROLOGICAL EXAM:  MENTAL STATUS: Speech:    Speech is normal; fluent and spontaneous with normal comprehension.  Cognition:     Orientation to time, place and person     Normal recent and remote memory     Normal Attention span and concentration     Normal Language, naming, repeating,spontaneous speech     Fund of knowledge   CRANIAL NERVES: CN II: Visual fields are full to confrontation.  Pupils are round equal and briskly reactive to light. CN III, IV, VI: extraocular movement are normal. No ptosis. CN V: Facial sensation is intact to pinprick in all 3 divisions bilaterally. Corneal responses are intact.  CN VII: Face is symmetric with normal eye closure and smile. CN VIII: Hearing is normal to rubbing fingers CN IX, X: Palate elevates symmetrically. Phonation is normal. CN XI: Head turning and shoulder shrug are intact CN XII: Tongue is  midline with normal movements and no atrophy.  MOTOR: Right hand resting tremor, moderate more than left side rigidity, bradykinesia, mild nuchal rigidity, he has no weakness  REFLEXES: Reflexes are 2+ and symmetric at the biceps, triceps, knees, and ankles. Plantar responses are flexor.  SENSORY: Intact to light touch, pinprick, position sense, and vibration sense are intact in fingers and toes.  COORDINATION: Rapid alternating movements and fine finger movements are intact. There is no dysmetria on finger-to-nose and heel-knee-shin.    GAIT/STANCE: Posture is normal. Gait is steady with normal steps, base, decreased right arm swing, increased  right hand tremor while ambulating, slight retropulsed instability .  Romberg is absent.   DIAGNOSTIC DATA (LABS, IMAGING, TESTING) - I reviewed patient records, labs, notes, testing and imaging myself where available.   ASSESSMENT AND PLAN  MASE DHONDT is a 70 y.o. male    Idiopathic Parkinson's disease  Keep Azilect 1 mg daily  increase Requip XL to 1m 4 tablet qhs.  I have encouraged him moderate exercise  Return to clinic in 6 months History of polymyositis   no weakness, well controlled on Imuran 50 mg twice a day, prednisone 1 mg every day, most recent CPK 83  If continues to do well, will consider tapering him of prednisone.  YMarcial Pacas M.D. Ph.D.  [facility] [address] [place] [address]Ph: [phone]: [phone] CC: Dr. JJani Gravel

## 2015-12-24 ENCOUNTER — Other Ambulatory Visit: Payer: Self-pay | Admitting: Neurology

## 2016-04-01 DIAGNOSIS — I10 Essential (primary) hypertension: Secondary | ICD-10-CM | POA: Diagnosis not present

## 2016-04-01 DIAGNOSIS — Z0000 Encounter for general adult medical examination without abnormal findings: Secondary | ICD-10-CM | POA: Diagnosis not present

## 2016-04-01 DIAGNOSIS — M3320 Polymyositis, organ involvement unspecified: Secondary | ICD-10-CM | POA: Diagnosis not present

## 2016-04-01 DIAGNOSIS — E7800 Pure hypercholesterolemia, unspecified: Secondary | ICD-10-CM | POA: Diagnosis not present

## 2016-05-06 ENCOUNTER — Ambulatory Visit: Payer: Medicare Other | Admitting: Neurology

## 2016-05-19 ENCOUNTER — Ambulatory Visit: Payer: Medicare Other | Admitting: Neurology

## 2016-06-30 DIAGNOSIS — Z79899 Other long term (current) drug therapy: Secondary | ICD-10-CM | POA: Diagnosis not present

## 2016-06-30 DIAGNOSIS — M3322 Polymyositis with myopathy: Secondary | ICD-10-CM | POA: Diagnosis not present

## 2016-06-30 DIAGNOSIS — I7300 Raynaud's syndrome without gangrene: Secondary | ICD-10-CM | POA: Diagnosis not present

## 2016-07-02 ENCOUNTER — Ambulatory Visit: Payer: Medicare Other | Admitting: Neurology

## 2016-08-10 ENCOUNTER — Encounter (INDEPENDENT_AMBULATORY_CARE_PROVIDER_SITE_OTHER): Payer: Self-pay

## 2016-08-10 ENCOUNTER — Ambulatory Visit (INDEPENDENT_AMBULATORY_CARE_PROVIDER_SITE_OTHER): Payer: Medicare Other | Admitting: Neurology

## 2016-08-10 ENCOUNTER — Encounter: Payer: Self-pay | Admitting: Neurology

## 2016-08-10 VITALS — BP 132/82 | HR 74 | Ht 70.0 in | Wt 175.0 lb

## 2016-08-10 DIAGNOSIS — G20 Parkinson's disease: Secondary | ICD-10-CM | POA: Diagnosis not present

## 2016-08-10 DIAGNOSIS — M3320 Polymyositis, organ involvement unspecified: Secondary | ICD-10-CM | POA: Diagnosis not present

## 2016-08-10 MED ORDER — RASAGILINE MESYLATE 1 MG PO TABS: 1.0000 mg | ORAL_TABLET | Freq: Every day | ORAL | 4 refills | Status: DC

## 2016-08-10 MED ORDER — ROPINIROLE HCL ER 4 MG PO TB24: 16.0000 mg | ORAL_TABLET | Freq: Every day | ORAL | 4 refills | Status: DC

## 2016-08-10 MED ORDER — AZATHIOPRINE 50 MG PO TABS: 100.0000 mg | ORAL_TABLET | Freq: Every day | ORAL | 4 refills | Status: DC

## 2016-08-10 NOTE — Progress Notes (Signed)
Chief Complaint  Patient presents with  . Parkinson's Disease    Feels his bilateral hand tremors are still problematic for him.  He has continue taking his Requip XL and Azilect, as prescribed.   . Polymyositis    He is taking Imuran 74m. BID.  He is not longer on Prednisone.      PATIENT: Alexander KOZAKDOB: [birth year] Chief Complaint  Patient presents with  . Parkinson's Disease    Feels his bilateral hand tremors are still problematic for him.  He has continue taking his Requip XL and Azilect, as prescribed.   . Polymyositis    He is taking Imuran 534m BID.  He is not longer on Prednisone.     HISTORICAL  Alexander CUADRAs a [age]ears old right-handed male, accompanied by his wife, seen in refer by his primary care physician Dr. JaJani Gravelor evaluation of right hand tremor, Initial evaluation was in August 2016  I have reviewed and summarized his most recent office note He had a past medical history of hyperlipidemia, Raynaud's disease,, had left carpal tunnel release surgery, right wrist fracture surgery, polymyositis,  left deltoid muscle biopsy consistent with fiber atrophy, perivasculitis in August 2002, at its worst, he had difficulty standing up, need his wife help him dressing, he has taken methotrexate along with low-dose prednisone for many years, in 2015, he was switched to Imuran 50 mg twice a day, most recent CPK level was 83, he denies significant muscle weakness now  I also reviewed laboratory results in July 2016, normal CMP, CBC, CPK 83, mild elevated LDL 134, normal ESR 6, hepatitis panel was negative  He is a retired miCompany secretarysince 2014, he noticed mild right hand tremor, getting worse gradually, there was no significant gait difficulty, mild lack of facial expression, he has lack of smell, which has been a chronic issues, he denies REM sleep disorder, mild constipations.  Update February 04 2015: He was diagnosed with mild Parkinson's disease, was  started on Azilect since August 2016, does not notice any symptomatic improvement either,  MRI of the brain without contrast in August 2016, mild supratentorium small vessel disease, no acute lesions.  UPDATE May 07 2015: He was started on requip xr titrating dose since October 2016, He still has mild right hand shaking, left hand is less, he is now taking Azilect 1 mg every day, requip xr 2 mg 2 tablets every night at 8pm.  He did not noticed any significant side effect.  It does help his tremor  He denies muscle weakness or muscle pain, he had a history of inflammatory polymyositis  UPDATE July 17th 2017: He is taking Azilect 71m16mday, he tolerated requip xL 4mg82mtabs po qhs well, he still has tremor, he sleeps well, he lost sense of smell, he has no significant gait abnormality, he has ok appetite, he exercise regularly,  5-10 mintues sit ups at that time, he denies obsessive compulsive behavior.  Update August 10 2016: Parkinson's Disease,  He is now taking ropinirole XL 4 mg 4 tablets every night, Azilect 1 mg every morning, there was no significant fluctuation noticed, he ambulate without much difficulty, does has intermittent right hand resting tremor,  Inflammatory polymyositis Imuran 50 mg 2 tablets every day He is doing well, still has significant right hand tremor,   REVIEW OF SYSTEMS: Full 14 system review of systems performed and notable only for tremor  ALLERGIES: No Known Allergies  HOME MEDICATIONS: Current Outpatient  Prescriptions  Medication Sig Dispense Refill  . azaTHIOprine (IMURAN) 50 MG tablet Take 100 mg by mouth daily.  3  . doxycycline (VIBRAMYCIN) 100 MG capsule TAKE ONE CAPSULE BY MOUTH UP TO TWICE DAY PRN WITH FOOD/ WATER. MAKES SKIN SENSITIVE TO SUN  3  . predniSONE (DELTASONE) 1 MG tablet Take 1 mg by mouth daily with breakfast.    . sildenafil (VIAGRA) 50 MG tablet Take 50 mg by mouth as needed for erectile dysfunction.       PAST MEDICAL  HISTORY: Past Medical History:  Diagnosis Date  . Hypercholesteremia   . Migraine   . Polymyositis ([place])   . Raynaud disease   . Tremor of right hand     PAST SURGICAL HISTORY: Past Surgical History:  Procedure Laterality Date  . CARPAL TUNNEL RELEASE Left   . INGUINAL HERNIA REPAIR Bilateral   . WRIST SURGERY Right     FAMILY HISTORY: Family History  Problem Relation Age of Onset  . Uterine cancer Mother   . Healthy Father   . Kidney cancer Brother   . Colon cancer Brother     SOCIAL HISTORY:  Social History   Social History  . Marital status: Married    Spouse name: N/A  . Number of children: 2  . Years of education: PhD   Occupational History  . Minister    Social History Main Topics  . Smoking status: Never Smoker  . Smokeless tobacco: Never Used  . Alcohol use No  . Drug use: No  . Sexual activity: Not on file   Other Topics Concern  . Not on file   Social History Narrative   Lives at home with his wife.   Right-handed.   1 cup caffeine daily.     PHYSICAL EXAM   Vitals:   08/10/16 1212  BP: 132/82  Pulse: 74  Weight: 175 lb (79.4 kg)  Height: 5' 10"  (1.778 m)    Not recorded      Body mass index is 25.11 kg/m.  PHYSICAL EXAMNIATION:  Gen: NAD, conversant, well nourised, obese, well groomed                     Cardiovascular: Regular rate rhythm, no peripheral edema, warm, nontender. Eyes: Conjunctivae clear without exudates or hemorrhage Neck: Supple, no carotid bruise. Pulmonary: Clear to auscultation bilaterally   NEUROLOGICAL EXAM:  MENTAL STATUS: Speech:    Speech is normal; fluent and spontaneous with normal comprehension.  Cognition:     Orientation to time, place and person     Normal recent and remote memory     Normal Attention span and concentration     Normal Language, naming, repeating,spontaneous speech     Fund of knowledge   CRANIAL NERVES: CN II: Visual fields are full to confrontation.  Pupils are  round equal and briskly reactive to light. CN III, IV, VI: extraocular movement are normal. No ptosis. CN V: Facial sensation is intact to pinprick in all 3 divisions bilaterally. Corneal responses are intact.  CN VII: Face is symmetric with normal eye closure and smile. CN VIII: Hearing is normal to rubbing fingers CN IX, X: Palate elevates symmetrically. Phonation is normal. CN XI: Head turning and shoulder shrug are intact CN XII: Tongue is midline with normal movements and no atrophy.  MOTOR: Right hand resting tremor, moderate more than left side rigidity, bradykinesia, mild nuchal rigidity, he has no weakness  REFLEXES: Reflexes are 2+ and symmetric at  the biceps, triceps, knees, and ankles. Plantar responses are flexor.  SENSORY: Intact to light touch, pinprick, position sense, and vibration sense are intact in fingers and toes.  COORDINATION: Rapid alternating movements and fine finger movements are intact. There is no dysmetria on finger-to-nose and heel-knee-shin.    GAIT/STANCE: Posture is normal. Gait is steady with normal steps, base, decreased right arm swing, increased right hand tremor while ambulating, slight retropulsed instability .  Romberg is absent.   DIAGNOSTIC DATA (LABS, IMAGING, TESTING) - I reviewed patient records, labs, notes, testing and imaging myself where available.   ASSESSMENT AND PLAN  Alexander Thomas is a 71 y.o. male    Idiopathic Parkinson's disease  Keep Azilect 1 mg daily, Requip XL to 58m 4 tablet qhs.  I have encouraged him moderate exercise  Return to clinic in 6 months  History of polymyositis   no weakness, well controlled on Imuran 50 mg twice a day, prednisone was tapered off    YMarcial Pacas M.D. Ph.D.  [facility] [address] [place] [address]Ph: [phone]: [phone] CC: Dr. JJani Gravel

## 2016-08-24 DIAGNOSIS — X32XXXD Exposure to sunlight, subsequent encounter: Secondary | ICD-10-CM | POA: Diagnosis not present

## 2016-08-24 DIAGNOSIS — L218 Other seborrheic dermatitis: Secondary | ICD-10-CM | POA: Diagnosis not present

## 2016-08-24 DIAGNOSIS — D225 Melanocytic nevi of trunk: Secondary | ICD-10-CM | POA: Diagnosis not present

## 2016-08-24 DIAGNOSIS — D045 Carcinoma in situ of skin of trunk: Secondary | ICD-10-CM | POA: Diagnosis not present

## 2016-08-24 DIAGNOSIS — L570 Actinic keratosis: Secondary | ICD-10-CM | POA: Diagnosis not present

## 2016-09-18 DIAGNOSIS — I10 Essential (primary) hypertension: Secondary | ICD-10-CM | POA: Diagnosis not present

## 2016-09-18 DIAGNOSIS — Z125 Encounter for screening for malignant neoplasm of prostate: Secondary | ICD-10-CM | POA: Diagnosis not present

## 2016-09-18 DIAGNOSIS — E7800 Pure hypercholesterolemia, unspecified: Secondary | ICD-10-CM | POA: Diagnosis not present

## 2016-09-28 DIAGNOSIS — I10 Essential (primary) hypertension: Secondary | ICD-10-CM | POA: Diagnosis not present

## 2016-09-28 DIAGNOSIS — N529 Male erectile dysfunction, unspecified: Secondary | ICD-10-CM | POA: Diagnosis not present

## 2016-09-28 DIAGNOSIS — I4891 Unspecified atrial fibrillation: Secondary | ICD-10-CM | POA: Diagnosis not present

## 2016-09-28 DIAGNOSIS — M3322 Polymyositis with myopathy: Secondary | ICD-10-CM | POA: Diagnosis not present

## 2016-10-07 ENCOUNTER — Telehealth: Payer: Self-pay | Admitting: *Deleted

## 2016-10-07 NOTE — Telephone Encounter (Signed)
Labs received from PCP (collected 09/18/16):  CBC w/ DIFF:  Lymph %: 20.3 (20.5-51.1%) - L Mono %: 11.4 (1.7 - 9.3%) - H All other values WNL  CMP: WNL  LIPID PANEL: WNL   UA: WNL  CK: WNL  SED RATE: WNL

## 2016-11-05 DIAGNOSIS — J90 Pleural effusion, not elsewhere classified: Secondary | ICD-10-CM | POA: Diagnosis not present

## 2016-11-05 DIAGNOSIS — N529 Male erectile dysfunction, unspecified: Secondary | ICD-10-CM | POA: Diagnosis not present

## 2016-11-05 DIAGNOSIS — R490 Dysphonia: Secondary | ICD-10-CM | POA: Diagnosis not present

## 2016-11-05 DIAGNOSIS — R634 Abnormal weight loss: Secondary | ICD-10-CM | POA: Diagnosis not present

## 2016-11-06 ENCOUNTER — Other Ambulatory Visit: Payer: Self-pay | Admitting: Internal Medicine

## 2016-11-06 DIAGNOSIS — R634 Abnormal weight loss: Secondary | ICD-10-CM

## 2016-11-10 ENCOUNTER — Ambulatory Visit
Admission: RE | Admit: 2016-11-10 | Discharge: 2016-11-10 | Disposition: A | Discharge status: Home / Self Care | Payer: Medicare Other | Source: Ambulatory Visit | Attending: Internal Medicine | Admitting: Internal Medicine

## 2016-11-10 DIAGNOSIS — R634 Abnormal weight loss: Secondary | ICD-10-CM

## 2016-11-10 DIAGNOSIS — N200 Calculus of kidney: Secondary | ICD-10-CM | POA: Diagnosis not present

## 2016-11-10 DIAGNOSIS — K5900 Constipation, unspecified: Secondary | ICD-10-CM | POA: Diagnosis not present

## 2016-11-10 DIAGNOSIS — J479 Bronchiectasis, uncomplicated: Secondary | ICD-10-CM | POA: Diagnosis not present

## 2016-11-10 DIAGNOSIS — R05 Cough: Secondary | ICD-10-CM | POA: Diagnosis not present

## 2016-11-10 MED ORDER — IOPAMIDOL (ISOVUE-300) INJECTION 61%
100.0000 mL | Freq: Once | INTRAVENOUS | Status: AC | PRN
Start: 2016-11-10 — End: 2016-11-10
  Administered 2016-11-10: 100 mL via INTRAVENOUS

## 2016-11-30 DIAGNOSIS — L719 Rosacea, unspecified: Secondary | ICD-10-CM | POA: Diagnosis not present

## 2016-11-30 DIAGNOSIS — I10 Essential (primary) hypertension: Secondary | ICD-10-CM | POA: Diagnosis not present

## 2016-11-30 DIAGNOSIS — M3322 Polymyositis with myopathy: Secondary | ICD-10-CM | POA: Diagnosis not present

## 2016-11-30 DIAGNOSIS — J479 Bronchiectasis, uncomplicated: Secondary | ICD-10-CM | POA: Diagnosis not present

## 2016-12-17 DIAGNOSIS — M25551 Pain in right hip: Secondary | ICD-10-CM | POA: Diagnosis not present

## 2016-12-17 DIAGNOSIS — M25511 Pain in right shoulder: Secondary | ICD-10-CM | POA: Diagnosis not present

## 2016-12-23 DIAGNOSIS — L821 Other seborrheic keratosis: Secondary | ICD-10-CM | POA: Diagnosis not present

## 2016-12-23 DIAGNOSIS — C44329 Squamous cell carcinoma of skin of other parts of face: Secondary | ICD-10-CM | POA: Diagnosis not present

## 2016-12-23 DIAGNOSIS — L739 Follicular disorder, unspecified: Secondary | ICD-10-CM | POA: Diagnosis not present

## 2016-12-23 DIAGNOSIS — D225 Melanocytic nevi of trunk: Secondary | ICD-10-CM | POA: Diagnosis not present

## 2017-01-20 DIAGNOSIS — Z08 Encounter for follow-up examination after completed treatment for malignant neoplasm: Secondary | ICD-10-CM | POA: Diagnosis not present

## 2017-01-20 DIAGNOSIS — L02223 Furuncle of chest wall: Secondary | ICD-10-CM | POA: Diagnosis not present

## 2017-01-20 DIAGNOSIS — L570 Actinic keratosis: Secondary | ICD-10-CM | POA: Diagnosis not present

## 2017-01-20 DIAGNOSIS — Z85828 Personal history of other malignant neoplasm of skin: Secondary | ICD-10-CM | POA: Diagnosis not present

## 2017-01-20 DIAGNOSIS — B9689 Other specified bacterial agents as the cause of diseases classified elsewhere: Secondary | ICD-10-CM | POA: Diagnosis not present

## 2017-01-20 DIAGNOSIS — X32XXXD Exposure to sunlight, subsequent encounter: Secondary | ICD-10-CM | POA: Diagnosis not present

## 2017-02-21 DIAGNOSIS — B029 Zoster without complications: Secondary | ICD-10-CM | POA: Diagnosis not present

## 2017-07-23 DIAGNOSIS — M3322 Polymyositis with myopathy: Secondary | ICD-10-CM | POA: Diagnosis not present

## 2017-07-23 DIAGNOSIS — B0229 Other postherpetic nervous system involvement: Secondary | ICD-10-CM | POA: Diagnosis not present

## 2017-07-23 DIAGNOSIS — E7800 Pure hypercholesterolemia, unspecified: Secondary | ICD-10-CM | POA: Diagnosis not present

## 2017-07-23 DIAGNOSIS — Z79899 Other long term (current) drug therapy: Secondary | ICD-10-CM | POA: Diagnosis not present

## 2017-07-23 DIAGNOSIS — I4891 Unspecified atrial fibrillation: Secondary | ICD-10-CM | POA: Diagnosis not present

## 2017-07-23 DIAGNOSIS — I10 Essential (primary) hypertension: Secondary | ICD-10-CM | POA: Diagnosis not present

## 2017-07-23 DIAGNOSIS — R251 Tremor, unspecified: Secondary | ICD-10-CM | POA: Diagnosis not present

## 2017-08-16 ENCOUNTER — Ambulatory Visit: Payer: Medicare Other | Admitting: Neurology

## 2017-09-27 DIAGNOSIS — I4891 Unspecified atrial fibrillation: Secondary | ICD-10-CM | POA: Diagnosis not present

## 2017-09-27 DIAGNOSIS — E7800 Pure hypercholesterolemia, unspecified: Secondary | ICD-10-CM | POA: Diagnosis not present

## 2017-09-27 DIAGNOSIS — I10 Essential (primary) hypertension: Secondary | ICD-10-CM | POA: Diagnosis not present

## 2017-09-27 DIAGNOSIS — M3322 Polymyositis with myopathy: Secondary | ICD-10-CM | POA: Diagnosis not present

## 2017-09-30 DIAGNOSIS — L709 Acne, unspecified: Secondary | ICD-10-CM | POA: Diagnosis not present

## 2017-09-30 DIAGNOSIS — R251 Tremor, unspecified: Secondary | ICD-10-CM | POA: Diagnosis not present

## 2017-09-30 DIAGNOSIS — Z0000 Encounter for general adult medical examination without abnormal findings: Secondary | ICD-10-CM | POA: Diagnosis not present

## 2017-09-30 DIAGNOSIS — M25559 Pain in unspecified hip: Secondary | ICD-10-CM | POA: Diagnosis not present

## 2017-09-30 DIAGNOSIS — M25552 Pain in left hip: Secondary | ICD-10-CM | POA: Diagnosis not present

## 2017-09-30 DIAGNOSIS — M3322 Polymyositis with myopathy: Secondary | ICD-10-CM | POA: Diagnosis not present

## 2017-11-11 DIAGNOSIS — L578 Other skin changes due to chronic exposure to nonionizing radiation: Secondary | ICD-10-CM | POA: Diagnosis not present

## 2017-11-11 DIAGNOSIS — L700 Acne vulgaris: Secondary | ICD-10-CM | POA: Diagnosis not present

## 2017-11-11 DIAGNOSIS — B029 Zoster without complications: Secondary | ICD-10-CM | POA: Diagnosis not present

## 2017-11-11 DIAGNOSIS — L0390 Cellulitis, unspecified: Secondary | ICD-10-CM | POA: Diagnosis not present

## 2017-11-11 DIAGNOSIS — L570 Actinic keratosis: Secondary | ICD-10-CM | POA: Diagnosis not present

## 2017-11-15 ENCOUNTER — Other Ambulatory Visit: Payer: Self-pay | Admitting: Neurology

## 2017-11-16 ENCOUNTER — Encounter: Payer: Self-pay | Admitting: Neurology

## 2017-11-16 ENCOUNTER — Ambulatory Visit (INDEPENDENT_AMBULATORY_CARE_PROVIDER_SITE_OTHER): Payer: Medicare Other | Admitting: Neurology

## 2017-11-16 VITALS — BP 114/65 | HR 98 | Ht 70.0 in | Wt 164.0 lb

## 2017-11-16 DIAGNOSIS — G20 Parkinson's disease: Secondary | ICD-10-CM

## 2017-11-16 DIAGNOSIS — M3320 Polymyositis, organ involvement unspecified: Secondary | ICD-10-CM | POA: Diagnosis not present

## 2017-11-16 MED ORDER — CARBIDOPA-LEVODOPA 25-100 MG PO TABS: 1.0000 | ORAL_TABLET | Freq: Three times a day (TID) | ORAL | 11 refills | Status: DC

## 2017-11-16 NOTE — Progress Notes (Signed)
Chief Complaint  Patient presents with  . Parkinson's Disease    He is here for his yearly follow up.  Says he is able to get right up to walk, without hesitation.  His tremors are still problematic.  He has some difficulty with constipation but milk of magnesium seems to help. He has continued taking both Azilect and Requip XL.  Marland Kitchen Polymyositis    He is doing well on Imuran 79m, one tablet daily.      PATIENT: Alexander Thomas HEARNDOB: [birth year] Chief Complaint  Patient presents with  . Parkinson's Disease    He is here for his yearly follow up.  Says he is able to get right up to walk, without hesitation.  His tremors are still problematic.  He has some difficulty with constipation but milk of magnesium seems to help. He has continued taking both Azilect and Requip XL.  .Marland KitchenPolymyositis    He is doing well on Imuran 572m one tablet daily.     HISTORICAL  RiDEVLIN BRINKs a 72years old right-handed male, accompanied by his wife, seen in refer by his primary care physician Dr. JaJani Gravelor evaluation of right hand tremor, Initial evaluation was in August 2016  I have reviewed and summarized his most recent office note  He had a past medical history of hyperlipidemia, Raynaud's disease,, had left carpal tunnel release surgery, right wrist fracture surgery, polymyositis,  left deltoid muscle biopsy consistent with fiber atrophy, perivasculitis in August 2002, at its worst, he had difficulty standing up, need his wife help him dressing, he has taken methotrexate along with low-dose prednisone for many years, in 2015, he was switched to Imuran 50 mg twice a day, most recent CPK level was 83, he denies significant muscle weakness now  I also reviewed laboratory results in July 2016, normal CMP, CBC, CPK 83, mild elevated LDL 134, normal ESR 6, hepatitis panel was negative  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  Update February 04 2015: He was diagnosed with mild Parkinson's disease, was started on Azilect since August 2016, does not notice any symptomatic improvement either,  MRI of the brain without contrast in August 2016, mild supratentorium small vessel disease, no acute lesions.  UPDATE May 07 2015: He was started on requip xr titrating dose since October 2016, He still has mild right hand shaking, left hand is less, he is now taking Azilect 1 mg every day, requip xr 2 mg 2 tablets every night at 8pm.  He did not noticed any significant side effect.  It does help his tremor  He denies muscle weakness or muscle pain, he had a history of inflammatory polymyositis  UPDATE July 17th 2017: He is taking Azilect 32m28mday, he tolerated requip xL 4mg66mtabs po qhs well, he still has tremor, he sleeps well, he lost sense of smell, he has no significant gait abnormality, he has ok appetite, he exercise regularly,  5-10 mintues sit ups at that time, he denies obsessive compulsive behavior.  Update August 10 2016: Parkinson's Disease,  He is now taking ropinirole XL 4 mg 4 tablets every night, Azilect 1 mg every morning, there was no significant fluctuation noticed, he ambulate without much difficulty, does has intermittent right hand  resting tremor,  Inflammatory polymyositis Imuran 50 mg 2 tablets every day He is doing well, still has significant right hand tremor,   UPDATE November 16 2017: He feels fine, continue to pastor a smaller church, taking Requip xl 72mg4 tabs qhs, azilect 145mdaily, imuran 5045m tabs every day, he denies significant muscle weakness, stated he can walk better, but has mild increased drooling, he denies significant memory loss,    REVIEW OF SYSTEMS: Full 14 system review of systems performed and notable only for  constipation, cough, fatigue, drooling  ALLERGIES: No Known Allergies  HOME MEDICATIONS: Current Outpatient Prescriptions  Medication Sig Dispense Refill  . azaTHIOprine (IMURAN) 50 MG tablet Take 100 mg by mouth daily.  3  . doxycycline (VIBRAMYCIN) 100 MG capsule TAKE ONE CAPSULE BY MOUTH UP TO TWICE DAY PRN WITH FOOD/ WATER. MAKES SKIN SENSITIVE TO SUN  3  . predniSONE (DELTASONE) 1 MG tablet Take 1 mg by mouth daily with breakfast.    . sildenafil (VIAGRA) 50 MG tablet Take 50 mg by mouth as needed for erectile dysfunction.       PAST MEDICAL HISTORY: Past Medical History:  Diagnosis Date  . Hypercholesteremia   . Migraine   . Pol[facility]ositis ([facility] . Raynaud disease   . Tremor of right hand     PAST SURGICAL HISTORY: Past Surgical History:  Procedure Laterality Date  . CARPAL TUNNEL RELEASE Left   . INGUINAL HERNIA REPAIR Bilateral   . WRIST SURGERY Right     FAMILY HISTORY: Family History  Problem Relation Age of Onset  . Uterine cancer Mother   . Healthy Father   . Kidney cancer Brother   . Colon cancer Brother     SOCIAL HISTORY:  Social History   Socioeconomic History  . Marital status: Married    Spouse name: Not on file  . Number of children: 2  . Years of education: PhD  . Highest education level: Not on file  Occupational History  . Occupation: MinCompany secretaryocial Needs  . Financial resource strain: Not on file  . Food insecurity:    Worry: Not on file    Inability: Not on file  . Transportation needs:    Medical: Not on file    Non-medical: Not on file  Tobacco Use  . Smoking status: Never Smoker  . Smokeless tobacco: Never Used  Substance and Sexual Activity  . Alcohol use: No    Alcohol/week: 0.0 oz  . Drug use: No  . Sexual activity: Not on file  Lifestyle  . Physical activity:    Days per week: Not on file    Minutes per session: Not on file  . Stress: Not on file  Relationships  . Social connections:    Talks on phone: Not on  file    Gets together: Not on file    Attends religious service: Not on file    Active member of club or organization: Not on file    Attends meetings of clubs or organizations: Not on file    Relationship status: Not on file  . Intimate partner violence:    Fear of current or ex partner: Not on file    Emotionally abused: Not on file    Physically abused: Not on file    Forced sexual activity: Not on file  Other Topics Concern  . Not on file  Social History Narrative   Lives at home with his wife.   Right-handed.  1 cup caffeine daily.     PHYSICAL EXAM   Vitals:   11/16/17 1453  BP: 114/65  Pulse: 98  Weight: 164 lb (74.4 kg)  Height: _0  (1.778 m)    Not recorded      Body mass index is 23.53 kg/m.  PHYSICAL EXAMNIATION:  Gen: NAD, conversant, well nourised, obese, well groomed                     Cardiovascular: Regular rate rhythm, no peripheral edema, warm, nontender. Eyes: Conjunctivae clear without exudates or hemorrhage Neck: Supple, no carotid bruise. Pulmonary: Clear to auscultation bilaterally   NEUROLOGICAL EXAM:  MENTAL STATUS: Speech:    Speech is normal; fluent and spontaneous with normal comprehension.  Cognition:     Orientation to time, place and person     Normal recent and remote memory     Normal Attention span and concentration     Normal Language, naming, repeating,spontaneous speech     Fund of knowledge   CRANIAL NERVES: CN II: Visual fields are full to confrontation.  Pupils are round equal and briskly reactive to light. CN III, IV, VI: extraocular movement are normal. No ptosis. CN V: Facial sensation is intact to pinprick in all 3 divisions bilaterally. Corneal responses are intact.  CN VII: Face is symmetric with normal eye closure and smile. CN VIII: Hearing is normal to rubbing fingers CN IX, X: Palate elevates symmetrically. Phonation is normal. CN XI: Head turning and shoulder shrug are intact CN XII: Tongue is  midline with normal movements and no atrophy.  MOTOR: Right hand resting tremor, moderate more than left side rigidity, bradykinesia, mild nuchal rigidity, he has no weakness  REFLEXES: Reflexes are 2+ and symmetric at the biceps, triceps, knees, and ankles. Plantar responses are flexor.  SENSORY: Intact to light touch, pinprick, position sense, and vibration sense are intact in fingers and toes.  COORDINATION: Rapid alternating movements and fine finger movements are intact. There is no dysmetria on finger-to-nose and heel-knee-shin.    GAIT/STANCE: Posture is normal. Gait is steady with normal steps, base, decreased right arm swing, increased right hand tremor while ambulating, slight retropulsed instability .  Romberg is absent.   DIAGNOSTIC DATA (LABS, IMAGING, TESTING) - I reviewed patient records, labs, notes, testing and imaging myself where available.   ASSESSMENT AND PLAN  LEALAND ELTING is a 72 y.o. male    Idiopathic Parkinson's disease  Keep Azilect 1 mg daily, Requip XL to 75m 4 tablet qhs.    Add on Sinemet 25/100 mg 3 times a day  I have encouraged him moderate exercise  Return to clinic in 6 months  History of polymyositis   no weakness, well controlled on Imuran 50 mg twice a day, prednisone was tapered off    YMarcial Pacas M.D. Ph.D.  [facility] [address] [place] [id number]: [phone]: [phone] CC: Dr. JJani Gravel

## 2017-11-23 DIAGNOSIS — I7300 Raynaud's syndrome without gangrene: Secondary | ICD-10-CM | POA: Diagnosis not present

## 2017-11-23 DIAGNOSIS — B0229 Other postherpetic nervous system involvement: Secondary | ICD-10-CM | POA: Diagnosis not present

## 2017-11-23 DIAGNOSIS — R109 Unspecified abdominal pain: Secondary | ICD-10-CM | POA: Diagnosis not present

## 2017-11-23 DIAGNOSIS — M3322 Polymyositis with myopathy: Secondary | ICD-10-CM | POA: Diagnosis not present

## 2017-11-23 DIAGNOSIS — Z79899 Other long term (current) drug therapy: Secondary | ICD-10-CM | POA: Diagnosis not present

## 2017-11-25 ENCOUNTER — Other Ambulatory Visit: Payer: Self-pay | Admitting: Neurology

## 2017-12-02 DIAGNOSIS — L570 Actinic keratosis: Secondary | ICD-10-CM | POA: Diagnosis not present

## 2017-12-02 DIAGNOSIS — C44319 Basal cell carcinoma of skin of other parts of face: Secondary | ICD-10-CM | POA: Diagnosis not present

## 2017-12-02 DIAGNOSIS — L219 Seborrheic dermatitis, unspecified: Secondary | ICD-10-CM | POA: Diagnosis not present

## 2017-12-10 DIAGNOSIS — C44319 Basal cell carcinoma of skin of other parts of face: Secondary | ICD-10-CM | POA: Diagnosis not present

## 2018-01-31 ENCOUNTER — Other Ambulatory Visit: Payer: Self-pay | Admitting: Neurology

## 2018-03-22 DIAGNOSIS — J988 Other specified respiratory disorders: Secondary | ICD-10-CM | POA: Diagnosis not present

## 2018-03-22 DIAGNOSIS — G20 Parkinson's disease: Secondary | ICD-10-CM | POA: Diagnosis not present

## 2018-03-28 DIAGNOSIS — I10 Essential (primary) hypertension: Secondary | ICD-10-CM | POA: Diagnosis not present

## 2018-03-28 DIAGNOSIS — M3322 Polymyositis with myopathy: Secondary | ICD-10-CM | POA: Diagnosis not present

## 2018-03-28 DIAGNOSIS — E7800 Pure hypercholesterolemia, unspecified: Secondary | ICD-10-CM | POA: Diagnosis not present

## 2018-03-28 DIAGNOSIS — C44319 Basal cell carcinoma of skin of other parts of face: Secondary | ICD-10-CM | POA: Diagnosis not present

## 2018-03-28 DIAGNOSIS — L570 Actinic keratosis: Secondary | ICD-10-CM | POA: Diagnosis not present

## 2018-03-28 DIAGNOSIS — L700 Acne vulgaris: Secondary | ICD-10-CM | POA: Diagnosis not present

## 2018-03-31 DIAGNOSIS — I7300 Raynaud's syndrome without gangrene: Secondary | ICD-10-CM | POA: Diagnosis not present

## 2018-03-31 DIAGNOSIS — B0229 Other postherpetic nervous system involvement: Secondary | ICD-10-CM | POA: Diagnosis not present

## 2018-03-31 DIAGNOSIS — N529 Male erectile dysfunction, unspecified: Secondary | ICD-10-CM | POA: Diagnosis not present

## 2018-03-31 DIAGNOSIS — M3322 Polymyositis with myopathy: Secondary | ICD-10-CM | POA: Diagnosis not present

## 2018-03-31 DIAGNOSIS — R251 Tremor, unspecified: Secondary | ICD-10-CM | POA: Diagnosis not present

## 2018-03-31 DIAGNOSIS — G20 Parkinson's disease: Secondary | ICD-10-CM | POA: Diagnosis not present

## 2018-05-19 ENCOUNTER — Ambulatory Visit: Payer: Medicare Other | Admitting: Neurology

## 2018-05-26 ENCOUNTER — Encounter: Payer: Self-pay | Admitting: Neurology

## 2018-05-26 ENCOUNTER — Ambulatory Visit (INDEPENDENT_AMBULATORY_CARE_PROVIDER_SITE_OTHER): Payer: Medicare Other | Admitting: Neurology

## 2018-05-26 VITALS — BP 120/64 | HR 84 | Ht 70.0 in | Wt 170.5 lb

## 2018-05-26 DIAGNOSIS — M3320 Polymyositis, organ involvement unspecified: Secondary | ICD-10-CM | POA: Diagnosis not present

## 2018-05-26 DIAGNOSIS — G20 Parkinson's disease: Secondary | ICD-10-CM | POA: Diagnosis not present

## 2018-05-26 DIAGNOSIS — G20A1 Parkinson's disease without dyskinesia, without mention of fluctuations: Secondary | ICD-10-CM

## 2018-05-26 MED ORDER — CARBIDOPA-LEVODOPA 25-100 MG PO TABS: 1.0000 | ORAL_TABLET | Freq: Three times a day (TID) | ORAL | 4 refills | Status: DC

## 2018-05-26 MED ORDER — RASAGILINE MESYLATE 1 MG PO TABS: 1.0000 mg | ORAL_TABLET | Freq: Every day | ORAL | 4 refills | Status: DC

## 2018-05-26 NOTE — Progress Notes (Signed)
Chief Complaint  Patient presents with  . Idiopathic Parkinson's Disease    He is here with his wife, Daylene Katayama.  Feels his right hand tremor and gait are still issues (drags feet).  His wife says he sometimes forgets to take his medications, as prescribed.       PATIENT: Alexander Thomas DOB: 1946-04-11  Chief Complaint  Patient presents with  . Idiopathic Parkinson's Disease    He is here with his wife, Daylene Katayama.  Feels his right hand tremor and gait are still issues (drags feet).  His wife says he sometimes forgets to take his medications, as prescribed.      HISTORICAL  Alexander Thomas is a 73 years old right-handed male, accompanied by his wife, seen in refer by his primary care physician Dr. Jani Gravel for evaluation of right hand tremor, Initial evaluation was in August 2016  I have reviewed and summarized his most recent office note  He had a past medical history of hyperlipidemia, Raynaud's disease,, had left carpal tunnel release surgery, right wrist fracture surgery, polymyositis,  left deltoid muscle biopsy consistent with fiber atrophy, perivasculitis in August 2002, at its worst, he had difficulty standing up, need his wife help him dressing, he has taken methotrexate along with low-dose prednisone for many years, in 2015, he was switched to Imuran 50 mg twice a day, most recent CPK level was 83, he denies significant muscle weakness now  I also reviewed laboratory results in July 2016, normal CMP, CBC, CPK 83, mild elevated LDL 134, normal ESR 6, hepatitis panel was negative  He is a retired Company secretary, since 2014, he noticed mild right hand tremor, getting worse gradually, there was no significant gait difficulty, mild lack of facial expression, he has lack of smell, which has been a chronic issues, he denies REM sleep disorder, mild constipations.  Update February 04 2015: He was diagnosed with mild Parkinson's disease, was started on Azilect since August 2016, does not notice  any symptomatic improvement either,  MRI of the brain without contrast in August 2016, mild supratentorium small vessel disease, no acute lesions.  UPDATE May 07 2015: He was started on requip xr titrating dose since October 2016, He still has mild right hand shaking, left hand is less, he is now taking Azilect 1 mg every day, requip xr 2 mg 2 tablets every night at 8pm.  He did not noticed any significant side effect.  It does help his tremor  He denies muscle weakness or muscle pain, he had a history of inflammatory polymyositis  UPDATE July 17th 2017: He is taking Azilect 23m qday, he tolerated requip xL 473m2 tabs po qhs well, he still has tremor, he sleeps well, he lost sense of smell, he has no significant gait abnormality, he has ok appetite, he exercise regularly,  5-10 mintues sit ups at that time, he denies obsessive compulsive behavior.  Update August 10 2016: Parkinson's Disease,  He is now taking ropinirole XL 4 mg 4 tablets every night, Azilect 1 mg every morning, there was no significant fluctuation noticed, he ambulate without much difficulty, does has intermittent right hand resting tremor,  Inflammatory polymyositis Imuran 50 mg 2 tablets every day He is doing well, still has significant right hand tremor,   UPDATE November 16 2017: He feels fine, continue to pastor a smaller church, taking Requip xl 3m53mtabs qhs, azilect 1mg83mily, imuran 50mg45mabs every day, he denies significant muscle weakness, stated he can walk  better, but has mild increased drooling, he denies significant memory loss,  UPDATE Feb 6th 2020: He is accompanied by his wife at today's clinical visit, missing his medication frequently, slow worsening memory loss, Today's [place] examination is 23 out of 30 no significant muscle weakness, He still passed her small church of elderly attendants  REVIEW OF SYSTEMS: Full 14 system review of systems performed and notable only for as above  All rest review of  the system were negative ALLERGIES: No Known Allergies  HOME MEDICATIONS: Current Outpatient Prescriptions  Medication Sig Dispense Refill  . azaTHIOprine (IMURAN) 50 MG tablet Take 100 mg by mouth daily.  3  . doxycycline (VIBRAMYCIN) 100 MG capsule TAKE ONE CAPSULE BY MOUTH UP TO TWICE DAY PRN WITH FOOD/ WATER. MAKES SKIN SENSITIVE TO SUN  3  . predniSONE (DELTASONE) 1 MG tablet Take 1 mg by mouth daily with breakfast.    . sildenafil (VIAGRA) 50 MG tablet Take 50 mg by mouth as needed for erectile dysfunction.       PAST MEDICAL HISTORY: Past Medical History:  Diagnosis Date  . Hypercholesteremia   . Migraine   . Polymyositis ([place])   . Raynaud disease   . Tremor of right hand     PAST SURGICAL HISTORY: Past Surgical History:  Procedure Laterality Date  . CARPAL TUNNEL RELEASE Left   . INGUINAL HERNIA REPAIR Bilateral   . WRIST SURGERY Right     FAMILY HISTORY: Family History  Problem Relation Age of Onset  . Uterine cancer Mother   . Healthy Father   . Kidney cancer Brother   . Colon cancer Brother     SOCIAL HISTORY:  Social History   Socioeconomic History  . Marital status: Married    Spouse name: Not on file  . Number of children: 2  . Years of education: PhD  . Highest education level: Not on file  Occupational History  . Occupation: Company secretary  Social Needs  . Financial resource strain: Not on file  . Food insecurity:    Worry: Not on file    Inability: Not on file  . Transportation needs:    Medical: Not on file    Non-medical: Not on file  Tobacco Use  . Smoking status: Never Smoker  . Smokeless tobacco: Never Used  Substance and Sexual Activity  . Alcohol use: No    Alcohol/week: 0.0 standard drinks  . Drug use: No  . Sexual activity: Not on file  Lifestyle  . Physical activity:    Days per week: Not on file    Minutes per session: Not on file  . Stress: Not on file  Relationships  . Social connections:    Talks on phone: Not on  file    Gets together: Not on file    Attends religious service: Not on file    Active member of club or organization: Not on file    Attends meetings of clubs or organizations: Not on file    Relationship status: Not on file  . Intimate partner violence:    Fear of current or ex partner: Not on file    Emotionally abused: Not on file    Physically abused: Not on file    Forced sexual activity: Not on file  Other Topics Concern  . Not on file  Social History Narrative   Lives at home with his wife.   Right-handed.   1 cup caffeine daily.     PHYSICAL EXAM  Vitals:   05/26/18 1128  BP: 120/64  Pulse: 84  Weight: 170 lb 8 oz (77.3 kg)  Height: 5' 10"  (1.778 m)    Not recorded      Body mass index is 24.46 kg/m.  PHYSICAL EXAMNIATION:  Gen: NAD, conversant, well nourised, obese, well groomed                     Cardiovascular: Regular rate rhythm, no peripheral edema, warm, nontender. Eyes: Conjunctivae clear without exudates or hemorrhage Neck: Supple, no carotid bruise. Pulmonary: Clear to auscultation bilaterally   NEUROLOGICAL EXAM:  Montreal Cognitive Assessment  05/26/2018  Visuospatial/ Executive (0/5) 4  Naming (0/3) 3  Attention: Read list of digits (0/2) 2  Attention: Read list of letters (0/1) 1  Attention: Serial 7 subtraction starting at 100 (0/3) 0  Language: Repeat phrase (0/2) 1  Language : Fluency (0/1) 0  Abstraction (0/2) 2  Delayed Recall (0/5) 4  Orientation (0/6) 6  Total 23     CRANIAL NERVES: CN II: Visual fields are full to confrontation.  Pupils are round equal and briskly reactive to light. CN III, IV, VI: extraocular movement are normal. No ptosis. CN V: Facial sensation is intact to pinprick in all 3 divisions bilaterally. Corneal responses are intact.  CN VII: Face is symmetric with normal eye closure and smile. CN VIII: Hearing is normal to rubbing fingers CN IX, X: Palate elevates symmetrically. Phonation is normal. CN XI:  Head turning and shoulder shrug are intact CN XII: Tongue is midline with normal movements and no atrophy.  MOTOR: Right hand resting tremor, moderate right more than left side rigidity, bradykinesia, mild nuchal rigidity, he has no weakness  REFLEXES: Reflexes are 2+ and symmetric at the biceps, triceps, knees, and ankles. Plantar responses are flexor.  SENSORY: Intact to light touch, pinprick, position sense, and vibration sense are intact in fingers and toes.  COORDINATION: Rapid alternating movements and fine finger movements are intact. There is no dysmetria on finger-to-nose and heel-knee-shin.    GAIT/STANCE: He needs to push up to get up from seated position, leaning forward, mildly unsteady, small stride, en bloc turning  DIAGNOSTIC DATA (LABS, IMAGING, TESTING) - I reviewed patient records, labs, notes, testing and imaging myself where available.   ASSESSMENT AND PLAN  CAREL CARRIER is a 73 y.o. male    Idiopathic Parkinson's disease  Keep Azilect 1 mg daily,   Stopped Requip because worsening memory loss  Keep Sinemet 25/100 mg 3 times a day  I have encouraged him moderate exercise  Return to clinic in 6 months with Sarah  History of polymyositis   no weakness, well controlled on Imuran 50 mg twice a day,  Marcial Pacas, M.D. Ph.D.  [facility] [address] [place], [place] [address] Ph: [phone] Fax: [phone]  CC: Dr. Jani Gravel

## 2018-06-23 DIAGNOSIS — M3322 Polymyositis with myopathy: Secondary | ICD-10-CM | POA: Diagnosis not present

## 2018-06-23 DIAGNOSIS — G20 Parkinson's disease: Secondary | ICD-10-CM | POA: Diagnosis not present

## 2018-06-23 DIAGNOSIS — Z79899 Other long term (current) drug therapy: Secondary | ICD-10-CM | POA: Diagnosis not present

## 2018-06-23 DIAGNOSIS — I7300 Raynaud's syndrome without gangrene: Secondary | ICD-10-CM | POA: Diagnosis not present

## 2018-08-30 DIAGNOSIS — B0229 Other postherpetic nervous system involvement: Secondary | ICD-10-CM | POA: Diagnosis not present

## 2018-08-30 DIAGNOSIS — R350 Frequency of micturition: Secondary | ICD-10-CM | POA: Diagnosis not present

## 2018-08-30 DIAGNOSIS — R399 Unspecified symptoms and signs involving the genitourinary system: Secondary | ICD-10-CM | POA: Diagnosis not present

## 2018-08-30 DIAGNOSIS — G4730 Sleep apnea, unspecified: Secondary | ICD-10-CM | POA: Diagnosis not present

## 2018-09-22 DIAGNOSIS — Z79899 Other long term (current) drug therapy: Secondary | ICD-10-CM | POA: Diagnosis not present

## 2018-09-22 DIAGNOSIS — M3322 Polymyositis with myopathy: Secondary | ICD-10-CM | POA: Diagnosis not present

## 2018-10-03 DIAGNOSIS — Z125 Encounter for screening for malignant neoplasm of prostate: Secondary | ICD-10-CM | POA: Diagnosis not present

## 2018-10-03 DIAGNOSIS — E785 Hyperlipidemia, unspecified: Secondary | ICD-10-CM | POA: Diagnosis not present

## 2018-10-03 DIAGNOSIS — E0590 Thyrotoxicosis, unspecified without thyrotoxic crisis or storm: Secondary | ICD-10-CM | POA: Diagnosis not present

## 2018-10-10 DIAGNOSIS — B0229 Other postherpetic nervous system involvement: Secondary | ICD-10-CM | POA: Diagnosis not present

## 2018-10-10 DIAGNOSIS — G4730 Sleep apnea, unspecified: Secondary | ICD-10-CM | POA: Diagnosis not present

## 2018-10-10 DIAGNOSIS — E7800 Pure hypercholesterolemia, unspecified: Secondary | ICD-10-CM | POA: Diagnosis not present

## 2018-10-10 DIAGNOSIS — R251 Tremor, unspecified: Secondary | ICD-10-CM | POA: Diagnosis not present

## 2018-10-10 DIAGNOSIS — G20 Parkinson's disease: Secondary | ICD-10-CM | POA: Diagnosis not present

## 2018-10-10 DIAGNOSIS — Z0000 Encounter for general adult medical examination without abnormal findings: Secondary | ICD-10-CM | POA: Diagnosis not present

## 2018-10-10 DIAGNOSIS — E0590 Thyrotoxicosis, unspecified without thyrotoxic crisis or storm: Secondary | ICD-10-CM | POA: Diagnosis not present

## 2018-10-10 DIAGNOSIS — M3320 Polymyositis, organ involvement unspecified: Secondary | ICD-10-CM | POA: Diagnosis not present

## 2018-10-10 DIAGNOSIS — Z125 Encounter for screening for malignant neoplasm of prostate: Secondary | ICD-10-CM | POA: Diagnosis not present

## 2018-10-10 DIAGNOSIS — N529 Male erectile dysfunction, unspecified: Secondary | ICD-10-CM | POA: Diagnosis not present

## 2018-10-10 DIAGNOSIS — I10 Essential (primary) hypertension: Secondary | ICD-10-CM | POA: Diagnosis not present

## 2018-10-10 DIAGNOSIS — J479 Bronchiectasis, uncomplicated: Secondary | ICD-10-CM | POA: Diagnosis not present

## 2018-11-15 ENCOUNTER — Telehealth: Payer: Self-pay | Admitting: Neurology

## 2018-11-15 ENCOUNTER — Encounter: Payer: Self-pay | Admitting: Neurology

## 2018-11-15 NOTE — Telephone Encounter (Signed)
I called patient regarding rescheduling his 8/6 appointment due to Judson Roch not seeing patients on Thursday afternoons. Patient did not answer the phone, and has no voicemail set up. I have sent the patient a letter to advise of this cancellation.

## 2018-11-24 ENCOUNTER — Ambulatory Visit: Payer: Medicare Other | Admitting: Neurology

## 2018-12-06 NOTE — Progress Notes (Deleted)
PATIENT: Alexander Thomas DOB: 1945-08-18  REASON FOR VISIT: follow up HISTORY FROM: patient  HISTORY OF PRESENT ILLNESS: Today 12/06/18  HISTORY  Alexander Thomas is a 73 years old right-handed male, accompanied by his wife, seen in refer by his primary care physician Dr. Jani Gravel for evaluation of right hand tremor, Initial evaluation was in August 2016  I have reviewed and summarized his most recent office note  He had a past medical history of hyperlipidemia, Raynaud's disease,, had left carpal tunnel release surgery, right wrist fracture surgery, polymyositis,  left deltoid muscle biopsy consistent with fiber atrophy, perivasculitis in August 2002, at its worst, he had difficulty standing up, need his wife help him dressing, he has taken methotrexate along with low-dose prednisone for many years, in 2015, he was switched to Imuran 50 mg twice a day, most recent CPK level was 83, he denies significant muscle weakness now  I also reviewed laboratory results in July 2016, normal CMP, CBC, CPK 83, mild elevated LDL 134, normal ESR 6, hepatitis panel was negative  He is a retired Company secretary, since 2014, he noticed mild right hand tremor, getting worse gradually, there was no significant gait difficulty, mild lack of facial expression, he has lack of smell, which has been a chronic issues, he denies REM sleep disorder, mild constipations.  Update February 04 2015: He was diagnosed with mild Parkinson's disease, was started on Azilect since August 2016, does not notice any symptomatic improvement either,  MRI of the brain without contrast in August 2016, mild supratentorium small vessel disease, no acute lesions.  UPDATE May 07 2015: He was started on requip xr titrating dose since October 2016, He still has mild right hand shaking, left hand is less, he is now taking Azilect 1 mg every day, requip xr 2 mg 2 tablets every night at 8pm.  He did not noticed any significant side  effect.  It does help his tremor  He denies muscle weakness or muscle pain, he had a history of inflammatory polymyositis  UPDATE July 17th 2017: He is taking Azilect 29m qday, he tolerated requip xL 480m2 tabs po qhs well, he still has tremor, he sleeps well, he lost sense of smell, he has no significant gait abnormality, he has ok appetite, he exercise regularly,  5-10 mintues sit ups at that time, he denies obsessive compulsive behavior.  Update August 10 2016: Parkinson's Disease,  He is now taking ropinirole XL 4 mg 4 tablets every night, Azilect 1 mg every morning, there was no significant fluctuation noticed, he ambulate without much difficulty, does has intermittent right hand resting tremor,  Inflammatory polymyositis Imuran 50 mg 2 tablets every day He is doing well, still has significant right hand tremor,   UPDATE November 16 2017: He feels fine, continue to pastor a smaller church, taking Requip xl 81m41mtabs qhs, azilect 1mg481mily, imuran 50mg63mabs every day, he denies significant muscle weakness, stated he can walk better, but has mild increased drooling, he denies significant memory loss,  UPDATE Feb 6th 2020: He is accompanied by his wife at today's clinical visit, missing his medication frequently, slow worsening memory loss, Today's Moca examination is 23 out of 30 no significant muscle weakness, He still passed her small church of elderly attendants  Update December 07, 2018 SS: He remains on Azilect 1 mg daily, Sinemet 25/103 times a day, stop Requip due to worsening memory loss  REVIEW OF SYSTEMS: Out of a  complete 14 system review of symptoms, the patient complains only of the following symptoms, and all other reviewed systems are negative.  ALLERGIES: No Known Allergies  HOME MEDICATIONS: Outpatient Medications Prior to Visit  Medication Sig Dispense Refill  . azaTHIOprine (IMURAN) 50 MG tablet TAKE 2 TABLETS (100 MG TOTAL) BY MOUTH DAILY. 180 tablet 4  .  carbidopa-levodopa (SINEMET IR) 25-100 MG tablet Take 1 tablet by mouth 3 (three) times daily. 270 tablet 4  . doxycycline (VIBRAMYCIN) 100 MG capsule TAKE ONE CAPSULE BY MOUTH UP TO TWICE DAY PRN WITH FOOD/ WATER. MAKES SKIN SENSITIVE TO SUN  3  . magnesium hydroxide (MILK OF MAGNESIA) 400 MG/5ML suspension Take 5 mLs by mouth as needed for mild constipation.    . rasagiline (AZILECT) 1 MG TABS tablet Take 1 tablet (1 mg total) by mouth daily. 90 tablet 4  . sildenafil (VIAGRA) 50 MG tablet Take 50 mg by mouth as needed for erectile dysfunction.     No facility-administered medications prior to visit.     PAST MEDICAL HISTORY: Past Medical History:  Diagnosis Date  . Hypercholesteremia   . Migraine   . Polymyositis ([place])   . Raynaud disease   . Tremor of right hand     PAST SURGICAL HISTORY: Past Surgical History:  Procedure Laterality Date  . CARPAL TUNNEL RELEASE Left   . INGUINAL HERNIA REPAIR Bilateral   . WRIST SURGERY Right     FAMILY HISTORY: Family History  Problem Relation Age of Onset  . Uterine cancer Mother   . Healthy Father   . Kidney cancer Brother   . Colon cancer Brother     SOCIAL HISTORY: Social History   Socioeconomic History  . Marital status: Married    Spouse name: Not on file  . Number of children: 2  . Years of education: PhD  . Highest education level: Not on file  Occupational History  . Occupation: Company secretary  Social Needs  . Financial resource strain: Not on file  . Food insecurity    Worry: Not on file    Inability: Not on file  . Transportation needs    Medical: Not on file    Non-medical: Not on file  Tobacco Use  . Smoking status: Never Smoker  . Smokeless tobacco: Never Used  Substance and Sexual Activity  . Alcohol use: No    Alcohol/week: 0.0 standard drinks  . Drug use: No  . Sexual activity: Not on file  Lifestyle  . Physical activity    Days per week: Not on file    Minutes per session: Not on file  . Stress: Not  on file  Relationships  . Social Herbalist on phone: Not on file    Gets together: Not on file    Attends religious service: Not on file    Active member of club or organization: Not on file    Attends meetings of clubs or organizations: Not on file    Relationship status: Not on file  . Intimate partner violence    Fear of current or ex partner: Not on file    Emotionally abused: Not on file    Physically abused: Not on file    Forced sexual activity: Not on file  Other Topics Concern  . Not on file  Social History Narrative   Lives at home with his wife.   Right-handed.   1 cup caffeine daily.      PHYSICAL EXAM  There  were no vitals filed for this visit. There is no height or weight on file to calculate BMI.  Generalized: Well developed, in no acute distress   Neurological examination  Mentation: Alert oriented to time, place, history taking. Follows all commands speech and language fluent Cranial nerve II-XII: Pupils were equal round reactive to light. Extraocular movements were full, visual field were full on confrontational test. Facial sensation and strength were normal. Uvula tongue midline. Head turning and shoulder shrug  were normal and symmetric. Motor: The motor testing reveals 5 over 5 strength of all 4 extremities. Good symmetric motor tone is noted throughout.  Sensory: Sensory testing is intact to soft touch on all 4 extremities. No evidence of extinction is noted.  Coordination: Cerebellar testing reveals good finger-nose-finger and heel-to-shin bilaterally.  Gait and station: Gait is normal. Tandem gait is normal. Romberg is negative. No drift is seen.  Reflexes: Deep tendon reflexes are symmetric and normal bilaterally.   DIAGNOSTIC DATA (LABS, IMAGING, TESTING) - I reviewed patient records, labs, notes, testing and imaging myself where available.  No results found for: WBC, HGB, HCT, MCV, PLT No results found for: NA, K, CL, CO2, GLUCOSE,  BUN, CREATININE, CALCIUM, PROT, ALBUMIN, AST, ALT, ALKPHOS, BILITOT, GFRNONAA, GFRAA No results found for: CHOL, HDL, LDLCALC, LDLDIRECT, TRIG, CHOLHDL No results found for: HGBA1C No results found for: VITAMINB12 No results found for: TSH    ASSESSMENT AND PLAN 73 y.o. year old male  has a past medical history of Hypercholesteremia, Migraine, Polymyositis ([place]), Raynaud disease, and Tremor of right hand. here with:  1.  Idiopathic Parkinson's disease   I spent 15 minutes with the patient. 50% of this time was spent   [place], [place], DNP 12/06/2018, 4:11 PM [facility] [address] [place], [place] [address] [phone]

## 2018-12-07 ENCOUNTER — Telehealth: Payer: Self-pay

## 2018-12-07 ENCOUNTER — Ambulatory Visit: Payer: Medicare Other | Admitting: Neurology

## 2018-12-07 ENCOUNTER — Encounter: Payer: Self-pay | Admitting: Neurology

## 2018-12-07 NOTE — Telephone Encounter (Signed)
Patient was a no call/no show for their appointment today.   

## 2018-12-16 ENCOUNTER — Other Ambulatory Visit: Payer: Self-pay | Admitting: Neurology

## 2018-12-22 DIAGNOSIS — I7300 Raynaud's syndrome without gangrene: Secondary | ICD-10-CM | POA: Diagnosis not present

## 2018-12-22 DIAGNOSIS — M3322 Polymyositis with myopathy: Secondary | ICD-10-CM | POA: Diagnosis not present

## 2018-12-22 DIAGNOSIS — G20 Parkinson's disease: Secondary | ICD-10-CM | POA: Diagnosis not present

## 2018-12-22 DIAGNOSIS — Z79899 Other long term (current) drug therapy: Secondary | ICD-10-CM | POA: Diagnosis not present

## 2019-01-16 NOTE — Progress Notes (Signed)
PATIENT: Alexander Thomas Fore DOB: January 14, 1946  REASON FOR VISIT: follow up HISTORY FROM: patient  HISTORY OF PRESENT ILLNESS: Today 01/17/19  HISTORY  Alexander Thomas is a 73 years old right-handed male, accompanied by his wife, seen in refer by his primary care physician Dr. Jani Gravel for evaluation of right hand tremor, Initial evaluation was in August 2016  I have reviewed and summarized his most recent office note  He had a past medical history of hyperlipidemia, Raynaud's disease,, had left carpal tunnel release surgery, right wrist fracture surgery, polymyositis,  left deltoid muscle biopsy consistent with fiber atrophy, perivasculitis in August 2002, at its worst, he had difficulty standing up, need his wife help him dressing, he has taken methotrexate along with low-dose prednisone for many years, in 2015, he was switched to Imuran 50 mg twice a day, most recent CPK level was 83, he denies significant muscle weakness now  I also reviewed laboratory results in July 2016, normal CMP, CBC, CPK 83, mild elevated LDL 134, normal ESR 6, hepatitis panel was negative  He is a retired Company secretary, since 2014, he noticed mild right hand tremor, getting worse gradually, there was no significant gait difficulty, mild lack of facial expression, he has lack of smell, which has been a chronic issues, he denies REM sleep disorder, mild constipations.  Update February 04 2015: He was diagnosed with mild Parkinson's disease, was started on Azilect since August 2016, does not notice any symptomatic improvement either,  MRI of the brain without contrast in August 2016, mild supratentorium small vessel disease, no acute lesions.  UPDATE May 07 2015: He was started on requip xr titrating dose since October 2016, He still has mild right hand shaking, left hand is less, he is now taking Azilect 1 mg every day, requip xr 2 mg 2 tablets every night at 8pm.  He did not noticed any significant side  effect.  It does help his tremor  He denies muscle weakness or muscle pain, he had a history of inflammatory polymyositis  UPDATE July 17th 2017: He is taking Azilect 68m qday, he tolerated requip xL 4710m2 tabs po qhs well, he still has tremor, he sleeps well, he lost sense of smell, he has no significant gait abnormality, he has ok appetite, he exercise regularly,  5-10 mintues sit ups at that time, he denies obsessive compulsive behavior.  Update August 10 2016: Parkinson's Disease,  He is now taking ropinirole XL 4 mg 4 tablets every night, Azilect 1 mg every morning, there was no significant fluctuation noticed, he ambulate without much difficulty, does has intermittent right hand resting tremor,  Inflammatory polymyositis Imuran 50 mg 2 tablets every day He is doing well, still has significant right hand tremor,   UPDATE November 16 2017: He feels fine, continue to pastor a smaller church, taking Requip xl 10m72mtabs qhs, azilect 1mg310mily, imuran 50mg26mabs every day, he denies significant muscle weakness, stated he can walk better, but has mild increased drooling, he denies significant memory loss,  UPDATE Feb 6th 2020: He is accompanied by his wife at today's clinical visit, missing his medication frequently, slow worsening memory loss, Today's Moca examination is 23 out of 30 no significant muscle weakness, He still passed her small church of elderly attendants  Update January 17, 2019 SS: He feels PD is stable, has been taking his medications consistently. He says he is doing well. He sleeps well at night, but wife notices apnea.  He snores, but has improved with using pillow. He will fall sleep very easily during the day. Is eating well, appetite is fair, occasionally have problems swallowing. He has tremor to right hand and arm (has not been responsive to sinemet). He denies falls. He drives a car, he does this well. He manages his medications, but his wife reminds him. 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 He denies any freezing episodes. He sees his primary doctor regularly, lab work was done 3 weeks ago. He forgets to take is medications regularly. He does have problems with drooling.  He presents today for follow-up with his wife.  REVIEW OF SYSTEMS: Out of a complete 14 system review of symptoms, the patient complains only of the following symptoms, and all other reviewed systems are negative.  Tremor, apnea  ALLERGIES: No Known Allergies  HOME MEDICATIONS: Outpatient Medications Prior to Visit  Medication Sig Dispense Refill  . azaTHIOprine (IMURAN) 50 MG tablet TAKE 2 TABLETS BY MOUTH EVERY DAY 180 tablet 0  . carbidopa-levodopa (SINEMET IR) 25-100 MG tablet Take 1 tablet by mouth 3 (three) times daily. 270 tablet 4  . COD LIVER OIL PO Take by mouth.    . doxycycline (VIBRAMYCIN) 100 MG capsule as needed.   3  . gabapentin (NEURONTIN) 300 MG capsule as needed.     . magnesium hydroxide (MILK OF MAGNESIA) 400 MG/5ML suspension Take 5 mLs by mouth as needed for mild constipation.    . rasagiline (AZILECT) 1 MG TABS tablet Take 1 tablet (1 mg total) by mouth daily. 90 tablet 4  . sildenafil (VIAGRA) 50 MG tablet Take 50 mg by mouth as needed for erectile dysfunction.     No facility-administered medications prior to visit.     PAST MEDICAL HISTORY: Past Medical History:  Diagnosis Date  . Hypercholesteremia   . Migraine   . Polymyositis ([place])   . Raynaud disease   . Tremor of right hand     PAST SURGICAL HISTORY: Past Surgical History:  Procedure Laterality Date  . CARPAL TUNNEL RELEASE Left   . INGUINAL HERNIA REPAIR Bilateral   . WRIST SURGERY Right     FAMILY HISTORY: Family History  Problem Relation Age of Onset  . Uterine cancer Mother   . Healthy Father   . Kidney cancer Brother   . Colon cancer Brother     SOCIAL HISTORY: Social History   Socioeconomic History  . Marital status: Married    Spouse name: Not on file  .  Number of children: 2  . Years of education: PhD  . Highest education level: Not on file  Occupational History  . Occupation: Company secretary  Social Needs  . Financial resource strain: Not on file  . Food insecurity    Worry: Not on file    Inability: Not on file  . Transportation needs    Medical: Not on file    Non-medical: Not on file  Tobacco Use  . Smoking status: Never Smoker  . Smokeless tobacco: Never Used  Substance and Sexual Activity  . Alcohol use: No    Alcohol/week: 0.0 standard drinks  . Drug use: No  . Sexual activity: Not on file  Lifestyle  . Physical activity    Days per week: Not on file    Minutes per session: Not on file  . Stress: Not on file  Relationships  . Social Herbalist on phone: Not on file    Gets  together: Not on file    Attends religious service: Not on file    Active member of club or organization: Not on file    Attends meetings of clubs or organizations: Not on file    Relationship status: Not on file  . Intimate partner violence    Fear of current or ex partner: Not on file    Emotionally abused: Not on file    Physically abused: Not on file    Forced sexual activity: Not on file  Other Topics Concern  . Not on file  Social History Narrative   Lives at home with his wife.   Right-handed.   1 cup caffeine daily.    PHYSICAL EXAM  Vitals:   01/17/19 1241  BP: 108/80  Pulse: 78  Temp: (!) 95.9 F (35.5 C)  Weight: 164 lb 9.6 oz (74.7 kg)   Body mass index is 23.62 kg/m.  Generalized: Well developed, in no acute distress   Neurological examination  Mentation: Alert oriented to time, place, history taking. Follows all commands speech and language fluent Cranial nerve II-XII: Pupils were equal round reactive to light. Extraocular movements were full, visual field were full on confrontational test. Facial sensation and strength were normal.  Head turning and shoulder shrug  were normal and symmetric. Motor: The motor  testing reveals 5 over 5 strength of all 4 extremities. Good symmetric motor tone is noted throughout.  Right hand resting tremor noted, right wrist rigidity more than the left is mild Sensory: Sensory testing is intact to soft touch on all 4 extremities. No evidence of extinction is noted.  Coordination: Cerebellar testing reveals good finger-nose-finger and heel-to-shin bilaterally. No bradykinesia seen. Gait and station: Has to push off from seated position, slightly forward leaning gait, good stride, right hand resting tremor, good turns Reflexes: Deep tendon reflexes are symmetric and normal bilaterally.   DIAGNOSTIC DATA (LABS, IMAGING, TESTING) - I reviewed patient records, labs, notes, testing and imaging myself where available.  No results found for: WBC, HGB, HCT, MCV, PLT No results found for: NA, K, CL, CO2, GLUCOSE, BUN, CREATININE, CALCIUM, PROT, ALBUMIN, AST, ALT, ALKPHOS, BILITOT, GFRNONAA, GFRAA No results found for: CHOL, HDL, LDLCALC, LDLDIRECT, TRIG, CHOLHDL No results found for: HGBA1C No results found for: VITAMINB12 No results found for: TSH  ASSESSMENT AND PLAN 74 y.o. year old male  has a past medical history of Hypercholesteremia, Migraine, Polymyositis ([place]), Raynaud disease, and Tremor of right hand. here with:  1.  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  History of polymyositis -No weakness noted today -Continue taking Imuran 50 mg twice daily (once get labs need to refill, verify dosing with patient) -Will get recent lab work from his primary while taking Imuran  3.  Reported daytime drowsiness,  apnea spells during sleep, snoring -We will place referral for sleep doctor evaluation at [facility] for possible OSA  I spent 25 minutes with the patient. 50% of this time was spent discussing his plan of care.   Butler Denmark, AGNP-C, DNP 01/17/2019, 12:54 PM [facility] [address] [place], [place] [address] [phone]

## 2019-01-17 ENCOUNTER — Encounter: Payer: Self-pay | Admitting: Neurology

## 2019-01-17 ENCOUNTER — Ambulatory Visit (INDEPENDENT_AMBULATORY_CARE_PROVIDER_SITE_OTHER): Payer: Medicare Other | Admitting: Neurology

## 2019-01-17 ENCOUNTER — Other Ambulatory Visit: Payer: Self-pay

## 2019-01-17 ENCOUNTER — Telehealth: Payer: Self-pay | Admitting: Neurology

## 2019-01-17 VITALS — BP 108/80 | HR 78 | Temp 95.9°F | Wt 164.6 lb

## 2019-01-17 DIAGNOSIS — R400 Somnolence: Secondary | ICD-10-CM

## 2019-01-17 DIAGNOSIS — M3320 Polymyositis, organ involvement unspecified: Secondary | ICD-10-CM | POA: Diagnosis not present

## 2019-01-17 DIAGNOSIS — G20 Parkinson's disease: Secondary | ICD-10-CM | POA: Diagnosis not present

## 2019-01-17 NOTE — Patient Instructions (Addendum)
1. Continue Sinemet  2. Continue Azilect  3. Take medications as prescribed  4. I will place a referral to see a sleep doctor here 5. Return in 6 months to see Dr. Krista Blue 6. Continue Imuran, I will get recent labs from Dr. Maudie Mercury

## 2019-01-17 NOTE — Telephone Encounter (Signed)
LMVM for Alexander Thomas in [place] at [facility] [phone] (Dr Maudie Mercury office).  LM for her to fax to [phone] most recent labs on this pt.

## 2019-01-17 NOTE — Telephone Encounter (Signed)
Can you please get recent lab work Dr. Jani Gravel office primary care. He is taking Imuran, I wanted to see recent lab work.

## 2019-01-18 NOTE — Progress Notes (Signed)
I have reviewed and agreed above plan. 

## 2019-01-18 NOTE — Telephone Encounter (Signed)
Received most recent labs.  To inbox.

## 2019-01-19 MED ORDER — AZATHIOPRINE 50 MG PO TABS: 100.0000 mg | ORAL_TABLET | Freq: Every day | ORAL | 1 refills | Status: DC

## 2019-01-19 NOTE — Telephone Encounter (Signed)
I received lab work from his primary care doctor at [facility], laboratory evaluation was unremarkable and within normal limits.  CRP 1, WBC 5.9, Hgb 14.1, platelet 184, glucose 90, creatinine 1.1, sodium 142, potassium 4.5, AST 15, ALT 13, sed rate 11, TSH 2.43.

## 2019-01-19 NOTE — Addendum Note (Signed)
Addended by: Suzzanne Cloud on: 01/19/2019 09:54 AM   Modules accepted: Orders

## 2019-01-24 ENCOUNTER — Other Ambulatory Visit: Payer: Self-pay

## 2019-01-24 ENCOUNTER — Ambulatory Visit (INDEPENDENT_AMBULATORY_CARE_PROVIDER_SITE_OTHER): Payer: Medicare Other | Admitting: Neurology

## 2019-01-24 ENCOUNTER — Encounter: Payer: Self-pay | Admitting: Neurology

## 2019-01-24 VITALS — BP 122/84 | HR 82 | Temp 97.5°F | Ht 69.0 in | Wt 165.0 lb

## 2019-01-24 DIAGNOSIS — M3320 Polymyositis, organ involvement unspecified: Secondary | ICD-10-CM

## 2019-01-24 DIAGNOSIS — Z9189 Other specified personal risk factors, not elsewhere classified: Secondary | ICD-10-CM | POA: Insufficient documentation

## 2019-01-24 DIAGNOSIS — G2581 Restless legs syndrome: Secondary | ICD-10-CM | POA: Diagnosis not present

## 2019-01-24 DIAGNOSIS — G4710 Hypersomnia, unspecified: Secondary | ICD-10-CM | POA: Diagnosis not present

## 2019-01-24 DIAGNOSIS — G4719 Other hypersomnia: Secondary | ICD-10-CM | POA: Diagnosis not present

## 2019-01-24 DIAGNOSIS — G4730 Sleep apnea, unspecified: Secondary | ICD-10-CM | POA: Diagnosis not present

## 2019-01-24 DIAGNOSIS — R351 Nocturia: Secondary | ICD-10-CM

## 2019-01-24 DIAGNOSIS — G20 Parkinson's disease: Secondary | ICD-10-CM | POA: Diagnosis not present

## 2019-01-24 NOTE — Patient Instructions (Signed)
Carbidopa; Levodopa tablets What is this medicine? CARBIDOPA;LEVODOPA (kar bi DOE pa; lee voe DOE pa) is used to treat the symptoms of Parkinson's disease. This medicine may be used for other purposes; ask your health care provider or pharmacist if you have questions. COMMON BRAND NAME(S): Atamet, SINEMET What should I tell my health care provider before I take this medicine? They need to know if you have any of these conditions:  asthma or lung disease  depression or other mental illness  diabetes  glaucoma  heart disease, including history of a heart attack  irregular heart beat  kidney or liver disease  melanoma or suspicious skin lesions  stomach or intestine ulcers  an unusual or allergic reaction to levodopa, carbidopa, other medicines, foods, dyes, or preservatives  pregnant or trying to get pregnant  breast-feeding How should I use this medicine? Take this medicine by mouth with a glass of water. Follow the directions on the prescription label. Take your doses at regular intervals. Do not take your medicine more often than directed. Do not stop taking except on the advice of your doctor or health care professional. Take 30 minutes before a meal or 1 hour after a meal.  Talk to your pediatrician regarding the use of this medicine in children. Special care may be needed. Overdosage: If you think you have taken too much of this medicine contact a poison control center or emergency room at once. NOTE: This medicine is only for you. Do not share this medicine with others. What if I miss a dose? If you miss a dose, take it as soon as you can. If it is almost time for your next dose, take only that dose. Do not take double or extra doses. What may interact with this medicine? Do not take this medicine with any of the following medications:  MAOIs like Marplan, Nardil, and Parnate  reserpine  tetrabenazine This medicine may also interact with the following  medications:  alcohol  droperidol  entacapone  iron supplements or multivitamins with iron  isoniazid, INH  linezolid  medicines for depression, anxiety, or psychotic disturbances  medicines for high blood pressure  medicines for sleep  metoclopramide  papaverine  procarbazine  tedizolid  rasagiline  selegiline  tolcapone This list may not describe all possible interactions. Give your health care provider a list of all the medicines, herbs, non-prescription drugs, or dietary supplements you use. Also tell them if you smoke, drink alcohol, or use illegal drugs. Some items may interact with your medicine. What should I watch for while using this medicine? Visit your doctor or health care professional for regular checks on your progress. It may be several weeks or months before you feel the full benefits of this medicine. Continue to take your medicine on a regular schedule. Do not take any additional medicines for Parkinson's disease without first consulting with your health care provider. You may experience a wearing of effect prior to the time for your next dose of this medicine. You may also experience an on-off effect where the medicine apparently stops working for anything from a minute to several hours, then suddenly starts working again. Tell your doctor or health care professional if any of these symptoms happen to you. Your dose may need to be changed. A high protein diet can slow or prevent absorption of this medicine. Avoid high protein foods near the time of taking this medicine to help to prevent these problems. Take this medicine at least 30 minutes before eating  or one hour after meals. You may want to eat higher protein foods later in the day or in small amounts. Discuss your diet with your doctor or health care professional or nutritionist. You may get drowsy or dizzy. Do not drive, use machinery, or do anything that needs mental alertness until you know how this  drug affects you. Do not stand or sit up quickly, especially if you are an older patient. This reduces the risk of dizzy or fainting spells. Alcohol can make you more drowsy and dizzy. Avoid alcoholic drinks. If you find that you have sudden feelings of wanting to sleep during normal activities, like cooking, watching television, or while driving or riding in a car, you should contact your health care professional. If you are diabetic, this medicine may interfere with the accuracy of some tests for sugar or ketones in the urine (does not interfere with blood tests). Check with your doctor or health care professional before changing the dose of your diabetic medicine. This medicine may discolor the urine or sweat, making it look darker or red in color. This is of no cause for concern. However, this may stain clothing or fabrics. There have been reports of increased sexual urges or other strong urges such as gambling while taking some medicines for Parkinson's disease. If you experience any of these urges while taking this medicine, you should report it to your health care provider as soon as possible. You should check your skin often for changes to moles and new growths while taking this medicine. Call your doctor if you notice any of these changes. This medicine may cause a decrease in vitamin B6. You should make sure that you get enough vitamin B6 while you are taking this medicine. Discuss the foods you eat and the vitamins you take with your health care professional. What side effects may I notice from receiving this medicine? Side effects that you should report to your doctor or health care professional as soon as possible:  allergic reactions like skin rash, itching or hives, swelling of the face, lips, or tongue  anxiety, confusion, or nervousness  falling asleep during normal activities like driving  fast, irregular heartbeat  hallucination, loss of contact with reality  mood changes like  aggressive behavior, depression  stomach pain  trouble passing urine  uncontrolled movements of the mouth, head, hands, feet, shoulders, eyelids or other unusual muscle movements Side effects that usually do not require medical attention (report to your doctor or health care professional if they continue or are bothersome):  headache  loss of appetite  muscle twitches  nausea, vomiting  nightmares, trouble sleeping  unusually weak or tired This list may not describe all possible side effects. Call your doctor for medical advice about side effects. You may report side effects to FDA at 1-800-FDA-1088. Where should I keep my medicine? Keep out of the reach of children. Store at room temperature between 15 and 30 degrees C (59 and 86 degrees F). Protect from light. Throw away any unused medicine after the expiration date. NOTE: This sheet is a summary. It may not cover all possible information. If you have questions about this medicine, talk to your doctor, pharmacist, or health care provider.  2020 Elsevier/Gold Standard (2016-11-13 08:21:48)

## 2019-01-24 NOTE — Progress Notes (Signed)
SLEEP MEDICINE CLINIC    Provider:  Larey Seat, MD  Primary Care Physician:  Jani Gravel, MD [name] [address]     Referring Provider: Dr Krista Blue, Butler Denmark, NP.           Chief Complaint according to patient   Patient presents with:    . New Patient (Initial Visit)     pt  Of dr Krista Blue, wife states that he snores and she witnessed irregular breathing patterns in sleep. never had a sleep test. His wife states he will fall asleep easily,  at the "drop of a hat". He struggles to stay w awake when hey have company. He has slept in his truck, pulled over and just couldn't complete his drive. The patient has Parkinson's disease.       HISTORY OF PRESENT ILLNESS:  Alexander Thomas is a 73 y.o. year old Caucasian right- handed male seen here as a referral on 01/24/2019 from Dr Krista Blue. NP Butler Denmark, for an evaluation of hypersomnia. Chief concern according to patient :  see above , son has CPAP and I now not as sleepy.    I have the pleasure of seeing Alexander Thomas today, a right-handed man who   has a past medical history of Hypercholesteremia, Migraine, Polymyositis ([place]), Raynaud disease, and Tremor of right hand. The diagnosi is actually PD, he feels short of breath and has hypersomnia.    Sleep relevant medical history: Nocturia: 3-4 times, No REM BD reported,   Family medical /sleep history: his son is on CPAP with OSA.   Social history:  Patient is retired from 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  Family status is married for 36 years, with 2  Adult children, 62 and 61 years old.  The patient currently works part time . Pets: one dog.  Tobacco use: never, ETOH use : none , Caffeine intake in form of Coffee( In AM ) Soda(on occassion) Tea ( decaffeinated ) or energy drinks.Regular exercise: none    Sleep habits are as follows: The patient's dinner time is between  5.30 -6 PM. The  patient goes to bed at 11 PM and continues to sleep for 2 hours, wakes for multiple  bathroom breaks, the first time at 1-2 AM.   The preferred sleep position is supine , with the support of 2-3  Pillows and neck pillow.  Dreams are reportedly frequent/vivid.TST about 6 hours.   7 AM is the usual rise time. The patient wakes up spontaneously.  He reports usually feeling refreshed/ restored in AM, without  symptoms such as morning headaches. He has a dry mouth. Not reporting residual fatigue.  Naps are taken  frequently, lasting from 20 to 45 minutes and are more refreshing than nocturnal sleep.    Review of Systems: Out of a complete 14 system review, the patient complains of only the following symptoms, and all other reviewed systems are negative.:  Fatigue, sleepiness , witnessed apneas and snoring, nocturia- fragmented sleep. Audible cyclic breathing.    How likely are you to doze in the following situations: 0 = not likely, 1 = slight chance, 2 = moderate chance, 3 = high chance   Sitting and Reading? 2-3 Watching Television? 2-3  Sitting inactive in a public place (theater or meeting)?2 As a passenger in a car for an hour without a break? 3 Lying down in the afternoon  when circumstances permit?3 Sitting and talking to someone?3 Sitting quietly after lunch without alcohol?2 In a car, while stopped for a few minutes in traffic?2   Total = 18/ 24 points   FSS endorsed at 38/ 63 points.   Social History   Socioeconomic History  . Marital status: Married    Spouse name: Not on file  . Number of children: 2  . Years of education: PhD  . Highest education level: Not on file  Occupational History  . Occupation: Company secretary  Social Needs  . Financial resource strain: Not on file  . Food insecurity    Worry: Not on file    Inability: Not on file  . Transportation needs    Medical: Not on file    Non-medical: Not on file  Tobacco Use  . Smoking status: Never Smoker  . Smokeless  tobacco: Never Used  Substance and Sexual Activity  . Alcohol use: No    Alcohol/week: 0.0 standard drinks  . Drug use: No  . Sexual activity: Not on file  Lifestyle  . Physical activity    Days per week: Not on file    Minutes per session: Not on file  . Stress: Not on file  Relationships  . Social Herbalist on phone: Not on file    Gets together: Not on file    Attends religious service: Not on file    Active member of club or organization: Not on file    Attends meetings of clubs or organizations: Not on file    Relationship status: Not on file  Other Topics Concern  . Not on file  Social History Narrative   Lives at home with his wife.   Right-handed.   1 cup caffeine daily.    Family History  Problem Relation Age of Onset  . Uterine cancer Mother   . Healthy Father   . Kidney cancer Brother   . Colon cancer Brother     Past Medical History:  Diagnosis Date  . Hypercholesteremia   . Migraine   . Polymyositis ([place])   . Raynaud disease   . Tremor of right hand     Past Surgical History:  Procedure Laterality Date  . CARPAL TUNNEL RELEASE Left   . INGUINAL HERNIA REPAIR Bilateral   . WRIST SURGERY Right      Current Outpatient Medications on File Prior to Visit  Medication Sig Dispense Refill  . azaTHIOprine (IMURAN) 50 MG tablet Take 2 tablets (100 mg total) by mouth daily. 180 tablet 1  . carbidopa-levodopa (SINEMET IR) 25-100 MG tablet Take 1 tablet by mouth 3 (three) times daily. 270 tablet 4  . COD LIVER OIL PO Take by mouth.    . doxycycline (VIBRAMYCIN) 100 MG capsule as needed.   3  . gabapentin (NEURONTIN) 300 MG capsule as needed.     . magnesium hydroxide (MILK OF MAGNESIA) 400 MG/5ML suspension Take 5 mLs by mouth as needed for mild constipation.    . rasagiline (AZILECT) 1 MG TABS tablet Take 1 tablet (1 mg total) by mouth daily. 90 tablet 4  . sildenafil (VIAGRA) 50 MG tablet Take 50 mg by mouth as needed for erectile dysfunction.      No current facility-administered medications on file prior to visit.     No Known Allergies  Physical exam:  Today's Vitals   01/24/19 0951  BP: 122/84  Pulse: 82  Temp: (!) 97.5 F (36.4 C)  Weight: 165 lb (  74.8 kg)  Height: 5\' 9"  (1.753 m)   Body mass index is 24.37 kg/m.   Wt Readings from Last 3 Encounters:  01/24/19 165 lb (74.8 kg)  01/17/19 164 lb 9.6 oz (74.7 kg)  05/26/18 170 lb 8 oz (77.3 kg)     Ht Readings from Last 3 Encounters:  01/24/19 5\' 9"  (1.753 m)  05/26/18 5\' 10"  (1.778 m)  11/16/17 5\' 10"  (1.778 m)      General: The patient is awake, alert and appears not in acute distress. The patient is well groomed. Head: Normocephalic, atraumatic. Neck is supple. Mallampati 2-3,  neck circumference: 15. 5"  inches . Nasal airflow is patent. Retrognathia is high grade  Dental status:  Cardiovascular:  Regular rate and cardiac rhythm by pulse,  without distended neck veins. Respiratory: Lungs are clear to auscultation.  Skin:  Without evidence of ankle edema, or rash. Trunk: The patient's posture is erect.   Neurologic exam : The patient is awake and alert, oriented to place and time.   Memory subjective described as intact.  Attention span & concentration ability appears normal.  Speech is fluent,  without  dysarthria, but strong dysphonia.  Mood and affect are appropriate.   Cranial nerves: no loss of smell or taste reported  Pupils are equal and briskly reactive to light. Funduscopic exam deferred.   Extraocular movements in vertical and horizontal planes were saccadic but without nystagmus.  No Diplopia. Visual fields by finger perimetry are intact. Hearing was intact to soft voice and finger rubbing.  Facial sensation intact to fine touch.  Facial motor strength is symmetric and tongue and uvula move midline.  Neck ROM : rotation, tilt and flexion extension were normal for age and shoulder shrug was symmetrical.    Motor exam:  Symmetric bulk,  tone and ROM.   Normal tone without cog wheeling, symmetric grip strength .   Sensory:  Fine touch, pinprick and vibration were  normal.  Proprioception tested in the upper extremities was normal.   Deep tendon reflexes: in the  upper and lower extremities are symmetric and intact.  Babinski response was deferred .      After spending a total time of  30  minutes face to face and additional time for physical and neurologic examination, review of laboratory studies,  personal review of imaging studies, reports and results of other testing and review of referral information / records as far as provided in visit, I have established the following assessments:  1)  Excessive daytime sleepiness in the setting of Parkinson's disease, a neurodegenerative disorder . Also contributing are medications for PD.  Constant drowsiness. No REM BD, but RLS before medication  were initated.   Witnessed cyclic breathing more than OSA- can indicate central apneas.   Nocturia is so frequent , it lets him not sleep long at a time.    My Plan is to proceed with:  1) attended sleep study , possible central apneas and lets check for REM BD.    I would like to thank Jani Gravel, MD and Dr Krista Blue  for allowing me to meet with and to take care of this pleasant patient.    I plan to follow up either personally or through our Sarah slack, NP within 2-3  month.   CC: I will share my notes with PCP  Electronically signed by: Larey Seat, MD 01/24/2019 10:05 AM  [facility] [employer] certified by [employer] of Sleep Medicine  and Diplomate of the [employer] of Sleep Medicine. Board certified In Neurology through the [place], Fellow of the [employer] of Neurology. Medical Director of [employer].

## 2019-02-20 ENCOUNTER — Ambulatory Visit (INDEPENDENT_AMBULATORY_CARE_PROVIDER_SITE_OTHER): Payer: Medicare Other | Admitting: Neurology

## 2019-02-20 DIAGNOSIS — Z9189 Other specified personal risk factors, not elsewhere classified: Secondary | ICD-10-CM

## 2019-02-20 DIAGNOSIS — G20 Parkinson's disease: Secondary | ICD-10-CM

## 2019-02-20 DIAGNOSIS — R351 Nocturia: Secondary | ICD-10-CM

## 2019-02-20 DIAGNOSIS — G4710 Hypersomnia, unspecified: Secondary | ICD-10-CM | POA: Diagnosis not present

## 2019-02-20 DIAGNOSIS — G4719 Other hypersomnia: Secondary | ICD-10-CM

## 2019-02-20 DIAGNOSIS — G2581 Restless legs syndrome: Secondary | ICD-10-CM

## 2019-02-20 DIAGNOSIS — G4730 Sleep apnea, unspecified: Secondary | ICD-10-CM

## 2019-02-20 DIAGNOSIS — M3320 Polymyositis, organ involvement unspecified: Secondary | ICD-10-CM

## 2019-03-06 ENCOUNTER — Telehealth: Payer: Self-pay | Admitting: Neurology

## 2019-03-06 NOTE — Procedures (Signed)
PATIENT'S NAME:  Alexander Thomas, Alexander Thomas DOB:      1946/03/25      MR#:    [id number]     DATE OF RECORDING: 02/20/2019 REFERRING M.D.:  Dr. Krista Blue, Jani Gravel MD Study Performed:   Baseline Polysomnogram HISTORY:  This pleasant 73 year old Caucasian right- handed male was seen upon referral on 01/24/2019, send by  Dr Krista Blue per NP Butler Denmark, for an evaluation of hypersomnia. Chief concern according to patient :  " my son has CPAP and is now not as sleepy as he was. I am very sleepy" His wife observed some gasping for breath and stated he will fall asleep within a minute.    I have the pleasure of seeing Alexander Thomas today, a right-handed man who has a medical history of Hypocholesteremia, Migraine, Polymyositis ([place]), Raynaud disease, and Tremor of right hand. His diagnosis is actually Parkinson's Disease, he reportedly feels short of breath and has hypersomnia. No REM BD reported.  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  Family status is married for 83 years, with 2 adult children, 50 and 81 years old.    Sleep habits are as follows: The patient's dinner time is between 5.30 -6 PM. The patient goes to bed at 11 PM and continues to sleep for 2 hours, wakes for multiple bathroom breaks, the first time at 1-2 AM.   The preferred sleep position is supine, with the support of 2-3 pillows and neck pillow.  Dreams are reportedly frequent/vivid. TST about 6 hours.   7 AM is the usual rise time. The patient wakes up spontaneously.  He reports usually feeling refreshed/ restored in AM, has no morning headaches. He has a dry mouth  Naps are taken frequently, lasting from 20 to 45 minutes and are more refreshing than nocturnal sleep.  The patient endorsed the Epworth Sleepiness Scale at 18 points.   The patient's weight 165 pounds with a height of 69 (inches), resulting in a BMI of 24.5 kg/m2. The  patient's neck circumference measured 15 inches.  CURRENT MEDICATIONS: Imuran, Sinemet, Vibramycin, Neurontin, Azilect, Viagra   PROCEDURE:  This is a multichannel digital polysomnogram utilizing the Somnostar 11.2 system.  Electrodes and sensors were applied and monitored per AASM Specifications.   EEG, EOG, Chin and Limb EMG, were sampled at 200 Hz.  ECG, Snore and Nasal Pressure, Thermal Airflow, Respiratory Effort, CPAP Flow and Pressure, Oximetry was sampled at 50 Hz. Digital video and audio were recorded.      BASELINE STUDY:  Lights Out was at 21:58 and Lights On at 04:33.  Total recording time (TRT) was 395.5 minutes, with a total sleep time (TST) of 315 minutes.   The patient's sleep latency was 36 minutes.  REM latency was 83.5 minutes.  The sleep efficiency was 79.6 %.     SLEEP ARCHITECTURE: WASO (Wake after sleep onset) was 43.5 minutes.  There were 12.5 minutes in Stage N1, 25 minutes Stage N2, 257 minutes Stage N3 and 20.5 minutes in Stage REM.  The percentage of Stage N1 was 4.%, Stage N2 was 7.9%, Stage N3 was 81.6% and Stage R (REM sleep) was 6.5%.     RESPIRATORY ANALYSIS:  There were a total of 12 respiratory events:  3 obstructive apneas, 0 central apneas and 9 mixed apneas with 0 hypopneas.      The total APNEA/HYPOPNEA INDEX (AHI) was  2.3 /hour.  4 events occurred in REM sleep and 8 events in NREM. The REM AHI was 11.7 /hour, versus a non-REM AHI of 1.6. The patient spent 315 minutes of total sleep time in the supine position and 0 minutes in non-supine. The supine AHI was 2.3 versus a non-supine AHI of 0.0.  OXYGEN SATURATION & C02:  The Wake baseline 02 saturation was 88%, with the lowest being 73%. Time spent below 89% saturation equaled 3 minutes. The patient had a total of 10 Periodic Limb Movements.  The Periodic Limb Movement (PLM) index was 1.9 and the PLM Arousal index was 0/hour. The arousals were noted as: 38 were spontaneous, 0 were associated with PLMs, 3 were  associated with respiratory events. Audio and video analysis did not show any abnormal or unusual movements, behaviors, phonations or vocalizations.  The patient took one bathroom break. Soft Snoring was noted. EKG with PACs, mostly in keeping with sinus rhythm (NSR). EEG was slowed, explaining the unusual high proportion of slow wave sleep, N 3.   IMPRESSION:  1. Clinically irrelevant degree of sleep apnea, some accentuation in REM sleep- Obstructive and Mixed Apneas. REM sleep accentuated apnea to 1.7/h.  2. Mild Periodic Limb Movement Disorder (PLMD) 3. Mild Snoring 4. PACs noted in EKG recording.    RECOMMENDATIONS: Overall slowed EEG recording most indicative of an encephalopathy or CNS degenerative disease to cause sleepiness.  No sleep disordered breathing of clinical significance. No other physiological causes for sleepiness were found. Consider medication review and possible stimulant use for hypersomnia.  I certify that I have reviewed the entire raw data recording prior to the issuance of this report in accordance with the Standards of Accreditation of the American Academy of Sleep Medicine (AASM)  Larey Seat, MD  03-06-2019 Diplomat, American Board of Psychiatry and Neurology  Diplomat, American Board of [employer] Director, [employer] [facility] [employer]

## 2019-03-06 NOTE — Telephone Encounter (Signed)
Called the patient and reviewed the sleep study results with the pt informing him there was no clinical sleep disorder present. Reviewed the study in detail and will send a copy to the patient at the address on file. Pt verbalized understanding. Pt had no questions at this time but was encouraged to call back if questions arise.

## 2019-03-06 NOTE — Telephone Encounter (Signed)
-----   Message from Larey Seat, MD sent at 03/06/2019 12:32 PM EST ----- IMPRESSION:   1. Clinically irrelevant degree of sleep apnea, some accentuation  in REM sleep- Obstructive and Mixed Apneas. REM sleep accentuated  apnea to 1.7/h.  2. Mild Periodic Limb Movement Disorder (PLMD)  3. Mild Snoring  4. PACs noted in EKG recording.    RECOMMENDATIONS: Overall slowed EEG recording most indicative of  an encephalopathy or CNS degenerative disease to cause  sleepiness.  No sleep disordered breathing of clinical significance. No other  physiological causes for sleepiness were found. Consider  medication review and possible stimulant use for hypersomnia.

## 2019-04-27 DIAGNOSIS — M5416 Radiculopathy, lumbar region: Secondary | ICD-10-CM | POA: Diagnosis not present

## 2019-04-27 DIAGNOSIS — M25552 Pain in left hip: Secondary | ICD-10-CM | POA: Diagnosis not present

## 2019-04-27 DIAGNOSIS — M545 Low back pain: Secondary | ICD-10-CM | POA: Diagnosis not present

## 2019-06-01 DIAGNOSIS — L570 Actinic keratosis: Secondary | ICD-10-CM | POA: Diagnosis not present

## 2019-06-01 DIAGNOSIS — L720 Epidermal cyst: Secondary | ICD-10-CM | POA: Diagnosis not present

## 2019-06-22 ENCOUNTER — Other Ambulatory Visit: Payer: Self-pay | Admitting: Neurology

## 2019-07-03 DIAGNOSIS — Z79899 Other long term (current) drug therapy: Secondary | ICD-10-CM | POA: Diagnosis not present

## 2019-07-03 DIAGNOSIS — I7300 Raynaud's syndrome without gangrene: Secondary | ICD-10-CM | POA: Diagnosis not present

## 2019-07-03 DIAGNOSIS — G20 Parkinson's disease: Secondary | ICD-10-CM | POA: Diagnosis not present

## 2019-07-03 DIAGNOSIS — M3322 Polymyositis with myopathy: Secondary | ICD-10-CM | POA: Diagnosis not present

## 2019-07-14 IMAGING — CT CT CHEST W/ CM
2 of 5 series · 11 of 36 positions shown, 18 images · IV contrast (READICAT/WATER & [ID] ISOVUE 300)
Comparison: Ultrasound 10/31/2008

CLINICAL DATA: Cough with weight loss and constipation

EXAM:
CT CHEST, ABDOMEN, AND PELVIS WITH CONTRAST
TECHNIQUE: Multidetector CT imaging of the chest, abdomen and pelvis was
performed following the standard protocol during bolus
administration of intravenous contrast.
Creatinine was obtained on site at [HOSPITAL] at [REDACTED].
Results: Creatinine 0.9 mg/dL.
CONTRAST:  100mL PC061I-5HH IOPAMIDOL (PC061I-5HH) INJECTION 61%

[Series 601: coronal body · coronal · 1.31mm/px · 1 of 112 slices shown, 2 images]
[im 38/112  soft-tissue]
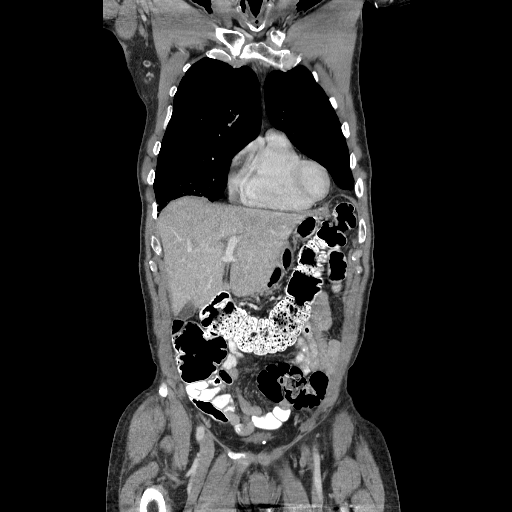
[im 38/112  bone]
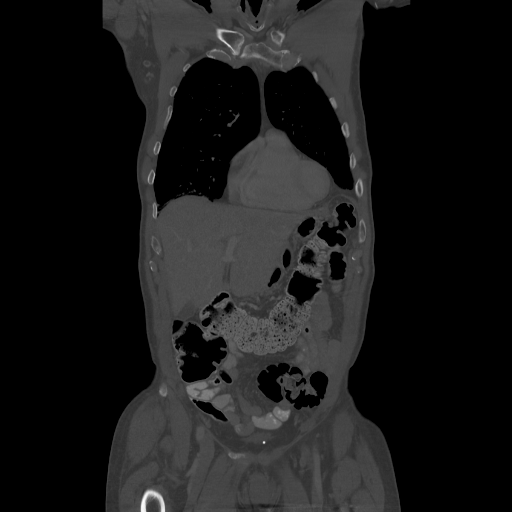

[Series 602: sagittal body · sagittal · 1.31mm/px · 10 of 163 slices shown, 16 images]
[im 15/163  soft-tissue]
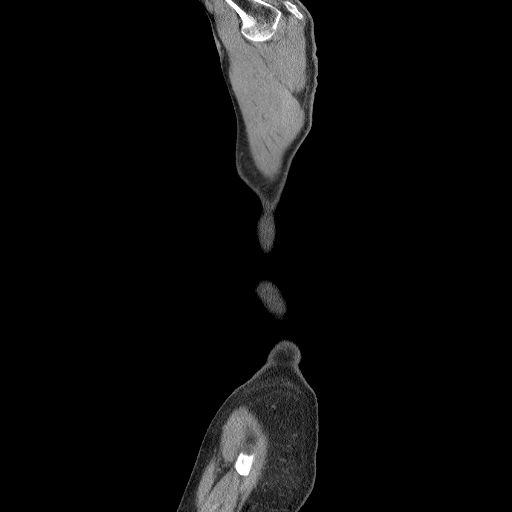
[im 15/163  lung]
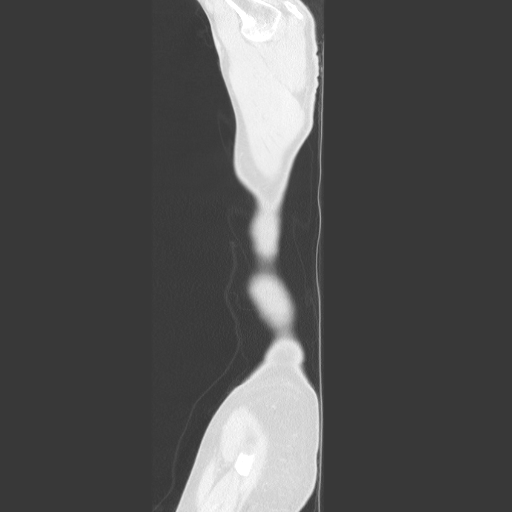
[im 15/163  bone]
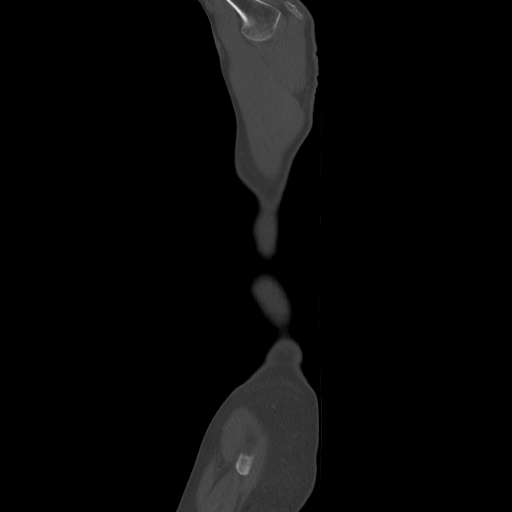
[im 30/163  soft-tissue]
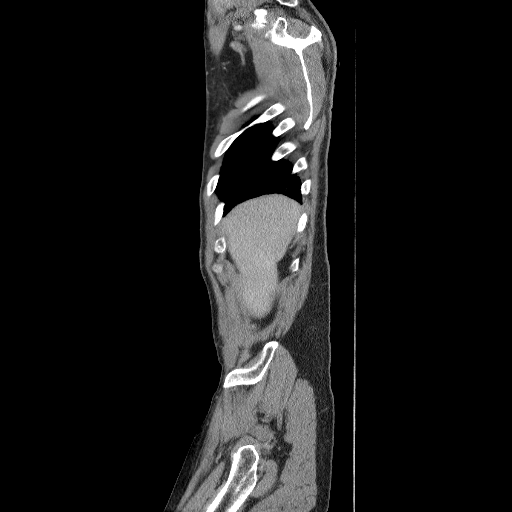
[im 30/163  lung]
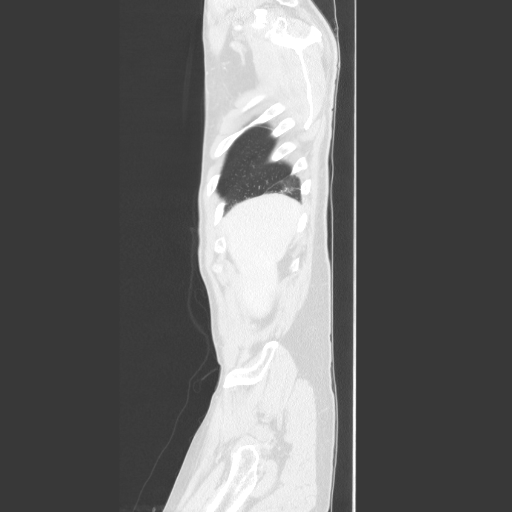
[im 45/163  soft-tissue]
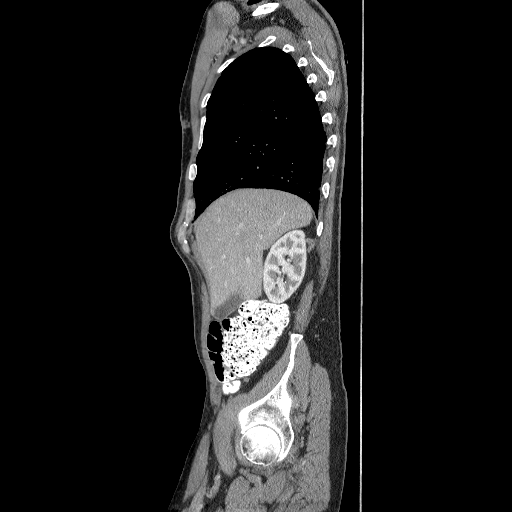
[im 45/163  lung]
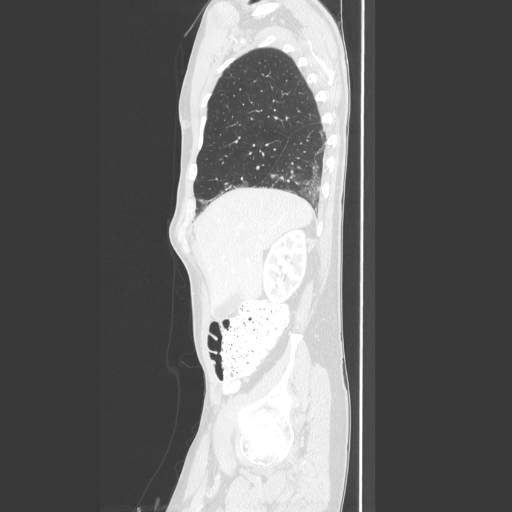
[im 59/163  soft-tissue]
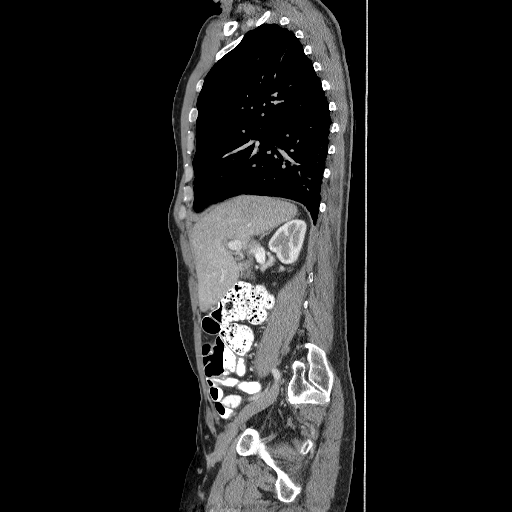
[im 59/163  lung]
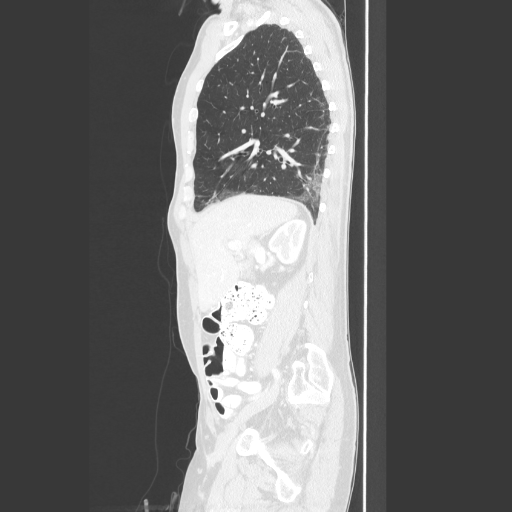
[im 74/163  soft-tissue]
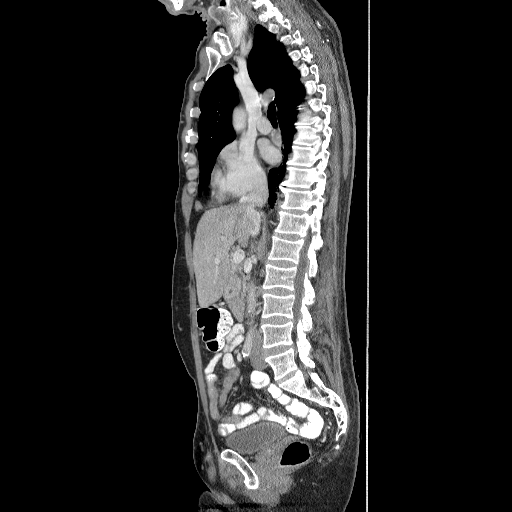
[im 89/163  soft-tissue]
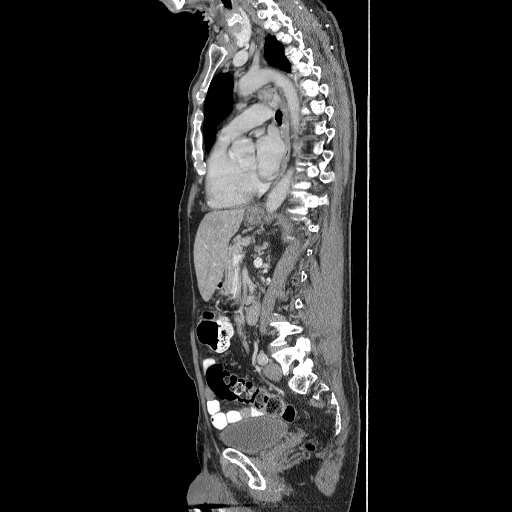
[im 104/163  soft-tissue]
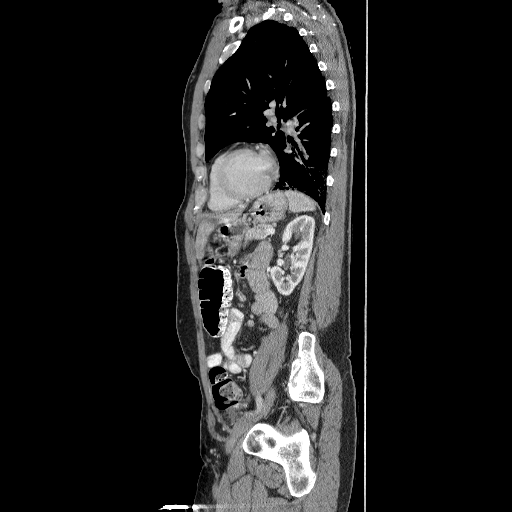
[im 118/163  soft-tissue]
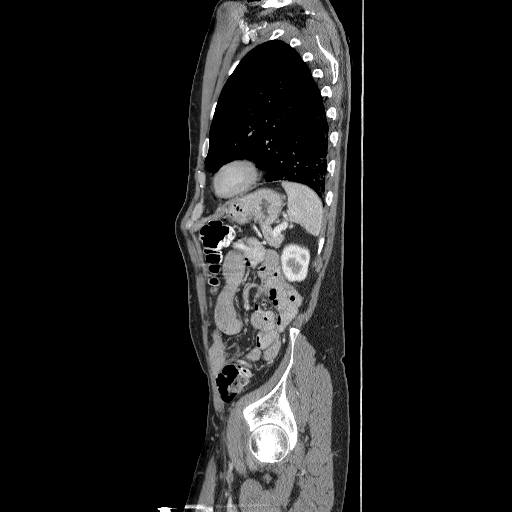
[im 133/163  soft-tissue]
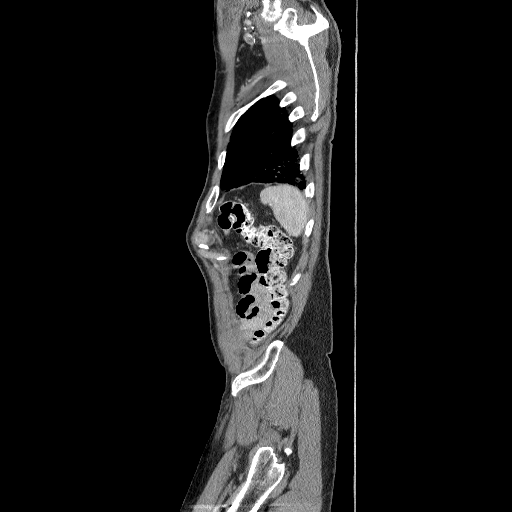
[im 133/163  bone]
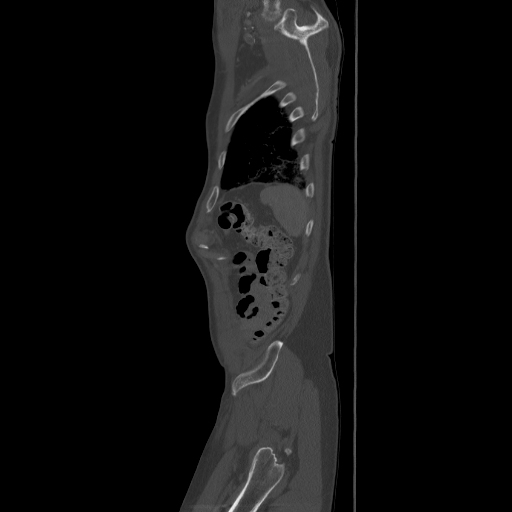
[im 148/163  soft-tissue]
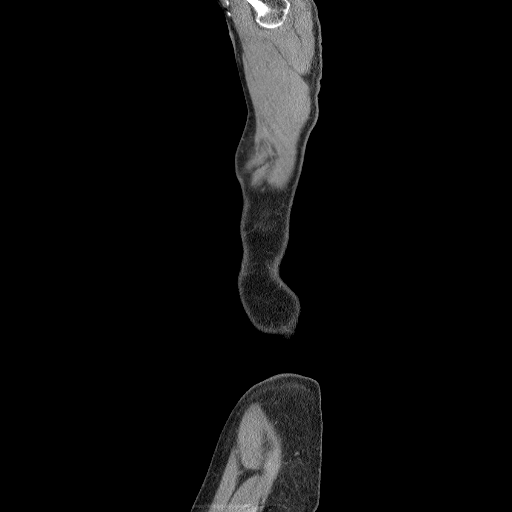

[11 of 36 positions shown; findings below may reference images not displayed]

FINDINGS: CT CHEST FINDINGS

Cardiovascular: Mild collateral vessels around the left shoulder and
upper posterior back. Non aneurysmal aorta. Atherosclerotic
calcifications. Mild coronary artery calcification. Normal heart
size. No significant pericardial effusion.

Mediastinum/Nodes: Mediastinal adenopathy is present. Lymph node
measuring up to 17 mm. No definite hilar adenopathy. Midline
trachea. No thyroid mass. Esophagus unremarkable

Lungs/Pleura: Biapical pleural and parenchymal scarring. No pleural
effusion or pneumothorax. Mild bronchiectasis within the lower
lobes. Ground-glass density within the subpleural right middle lobe,
lingula, and posterior lower lobes.

Musculoskeletal: No acute or suspicious bone lesion.

CT ABDOMEN PELVIS FINDINGS

Hepatobiliary: No focal liver abnormality is seen. No gallstones,
gallbladder wall thickening, or biliary dilatation.

Pancreas: Unremarkable. No pancreatic ductal dilatation or
surrounding inflammatory changes.

Spleen: Normal in size without focal abnormality.

Adrenals/Urinary Tract: Adrenal glands are within normal limits.
subcentimeter hypodensity lower pole right kidney too small to
further characterize. 5 mm stone lower pole right kidney. Bladder
unremarkable.

Stomach/Bowel: Stomach within normal limits. No dilated small bowel.
No colon wall thickening. Moderate stool in the colon.

Vascular/Lymphatic: Aortic atherosclerosis. No enlarged abdominal or
pelvic lymph nodes.

Reproductive: Prostate calcifications.  Slightly enlarged.

Other: No free air or free fluid.

Musculoskeletal: Degenerative changes. No acute or suspicious bone
lesion
IMPRESSION: 1. Mild bronchiectasis in the bilateral lower lobes. Ground-glass
densities are visualized within the right middle lobe, lingula, and
posterior lower lobes. Findings could be secondary to idiopathic
interstitial pneumonia, or possible multifocal foci of infection.
Clinical correlation recommended with consideration given toward
high-resolution chest CT
2. Mild mediastinal adenopathy, possibly due to inflammation or
infection
3. No CT evidence for acute intra-abdominal or pelvic pathology
4. Nonobstructing stone in the right kidney

## 2019-07-17 ENCOUNTER — Ambulatory Visit: Payer: Medicare Other | Admitting: Neurology

## 2019-07-18 ENCOUNTER — Other Ambulatory Visit: Payer: Self-pay | Admitting: Neurology

## 2019-07-25 DIAGNOSIS — D485 Neoplasm of uncertain behavior of skin: Secondary | ICD-10-CM | POA: Diagnosis not present

## 2019-07-25 DIAGNOSIS — L219 Seborrheic dermatitis, unspecified: Secondary | ICD-10-CM | POA: Diagnosis not present

## 2019-07-25 DIAGNOSIS — L578 Other skin changes due to chronic exposure to nonionizing radiation: Secondary | ICD-10-CM | POA: Diagnosis not present

## 2019-07-25 DIAGNOSIS — L570 Actinic keratosis: Secondary | ICD-10-CM | POA: Diagnosis not present

## 2019-07-25 DIAGNOSIS — L080 Pyoderma: Secondary | ICD-10-CM | POA: Diagnosis not present

## 2019-07-25 DIAGNOSIS — C44519 Basal cell carcinoma of skin of other part of trunk: Secondary | ICD-10-CM | POA: Diagnosis not present

## 2019-07-25 DIAGNOSIS — D045 Carcinoma in situ of skin of trunk: Secondary | ICD-10-CM | POA: Diagnosis not present

## 2019-08-07 DIAGNOSIS — C44519 Basal cell carcinoma of skin of other part of trunk: Secondary | ICD-10-CM | POA: Diagnosis not present

## 2019-08-29 ENCOUNTER — Other Ambulatory Visit: Payer: Self-pay

## 2019-08-29 ENCOUNTER — Ambulatory Visit (INDEPENDENT_AMBULATORY_CARE_PROVIDER_SITE_OTHER): Payer: Medicare Other | Admitting: Neurology

## 2019-08-29 ENCOUNTER — Encounter: Payer: Self-pay | Admitting: Neurology

## 2019-08-29 VITALS — BP 121/75 | HR 76 | Ht 70.0 in | Wt 160.0 lb

## 2019-08-29 DIAGNOSIS — M3320 Polymyositis, organ involvement unspecified: Secondary | ICD-10-CM | POA: Diagnosis not present

## 2019-08-29 DIAGNOSIS — M545 Low back pain, unspecified: Secondary | ICD-10-CM | POA: Insufficient documentation

## 2019-08-29 DIAGNOSIS — G20 Parkinson's disease: Secondary | ICD-10-CM

## 2019-08-29 DIAGNOSIS — G3184 Mild cognitive impairment, so stated: Secondary | ICD-10-CM | POA: Diagnosis not present

## 2019-08-29 DIAGNOSIS — R269 Unspecified abnormalities of gait and mobility: Secondary | ICD-10-CM

## 2019-08-29 MED ORDER — AZATHIOPRINE 50 MG PO TABS: 100.0000 mg | ORAL_TABLET | Freq: Every day | ORAL | 4 refills | Status: DC

## 2019-08-29 NOTE — Progress Notes (Signed)
PATIENT: Alexander Thomas DOB: October 07, 1945  REASON FOR VISIT: follow up HISTORY FROM: patient  HISTORY OF PRESENT ILLNESS: Today 08/29/19  HISTORY  Alexander Thomas is a 74 years old right-handed male, accompanied by his wife, seen in refer by his primary care physician Dr. 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, at its worst, he had difficulty standing up, need his wife help him dressing, he has taken methotrexate along with low-dose prednisone for many years, in 2015, he was switched to Imuran 50 mg twice a day, most recent CPK level was 83, he denies significant muscle weakness now  I also reviewed laboratory results in July 2016, normal CMP, CBC, CPK 83, mild elevated LDL 134, normal ESR 6, hepatitis panel was negative  He is a retired Company secretary, since 2014, he noticed mild right hand tremor, getting worse gradually, there was no significant gait difficulty, mild lack of facial expression, he has lack of smell, which has been a chronic issues, he denies REM sleep disorder, mild constipations.  Update February 04 2015: He was diagnosed with mild Parkinson's disease, was started on Azilect since August 2016, does not notice any symptomatic improvement either,  MRI of the brain without contrast in August 2016, mild supratentorium small vessel disease, no acute lesions.  UPDATE May 07 2015: He was started on requip xr titrating dose since October 2016, He still has mild right hand shaking, left hand is less, he is now taking Azilect 1 mg every day, requip xr 2 mg 2 tablets every night at 8pm.  He did not noticed any significant side  effect.  It does help his tremor  He denies muscle weakness or muscle pain, he had a history of inflammatory polymyositis  UPDATE July 17th 2017: He is taking Azilect 83m qday, he tolerated requip xL 460m2 tabs po qhs well, he still has tremor, he sleeps well, he lost sense of smell, he has no significant gait abnormality, he has ok appetite, he exercise regularly,  5-10 mintues sit ups at that time, he denies obsessive compulsive behavior.  Update August 10 2016: Parkinson's Disease,  He is now taking ropinirole XL 4 mg 4 tablets every night, Azilect 1 mg every morning, there was no significant fluctuation noticed, he ambulate without much difficulty, does has intermittent right hand resting tremor,  Inflammatory polymyositis Imuran 50 mg 2 tablets every day He is doing well, still has significant right hand tremor,   UPDATE November 16 2017: He feels fine, continue to pastor a smaller church, taking Requip xl 68m87mtabs qhs, azilect 1mg76mily, imuran 50mg107mabs every day, he denies significant muscle weakness, stated he can walk better, but has mild increased drooling, he denies significant memory loss,  UPDATE Feb 6th 2020: He is accompanied by his wife at today's clinical visit, missing his medication frequently, slow worsening memory loss, Today's [place] examination is 23 out of 30 no significant muscle weakness, He still passed her small church of elderly attendants  UPDATE Aug 29 2019: He has constant right hand tremor, 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  Tremor, apnea  ALLERGIES: No Known Allergies  HOME MEDICATIONS: Outpatient Medications Prior to Visit  Medication Sig Dispense Refill  . azaTHIOprine (IMURAN) 50 MG tablet Take 2 tablets (100 mg total) by mouth daily. 180 tablet 1  . carbidopa-levodopa (SINEMET IR) 25-100 MG tablet TAKE 1 TABLET BY MOUTH THREE TIMES A DAY 270 tablet 4  . COD LIVER OIL PO Take by mouth.    . doxycycline (VIBRAMYCIN) 100 MG capsule as needed.   3  . gabapentin (NEURONTIN) 300 MG capsule as needed.     . magnesium hydroxide (MILK OF MAGNESIA) 400 MG/5ML suspension Take 5 mLs by mouth as needed for mild constipation.    . rasagiline (AZILECT) 1 MG TABS tablet TAKE 1 TABLET BY MOUTH EVERY DAY 90 tablet 4  . sildenafil (VIAGRA) 50 MG tablet Take 50 mg by mouth as needed for erectile dysfunction.     No facility-administered medications prior to visit.    PAST MEDICAL HISTORY: Past Medical History:  Diagnosis Date  . Hypercholesteremia   . Migraine   . Polymyositis ([place])   . Raynaud disease   . Tremor of right hand     PAST SURGICAL HISTORY: Past Surgical History:  Procedure Laterality Date  . CARPAL TUNNEL RELEASE Left   . INGUINAL HERNIA REPAIR Bilateral   . WRIST SURGERY Right     FAMILY HISTORY: Family History  Problem Relation Age of Onset  . Uterine cancer Mother   . Healthy Father   . Kidney cancer Brother   . Colon cancer Brother     SOCIAL HISTORY: Social History   Socioeconomic History  . Marital status: Married    Spouse name: Not on file  . Number of children: 2  . Years of education: PhD  . Highest education level: Not on file  Occupational History  . Occupation: Company secretary  Tobacco Use  . Smoking status: Never Smoker  . Smokeless tobacco: Never Used  Substance and Sexual Activity  . Alcohol use: No     Alcohol/week: 0.0 standard drinks  . Drug use: No  . Sexual activity: Not on file  Other Topics Concern  . Not on file  Social History Narrative   Lives at home with his wife.   Right-handed.   1 cup caffeine daily.   Social Determinants of Health   Financial Resource Strain:   . Difficulty of Paying Living Expenses:   Food Insecurity:   . Worried About Charity fundraiser in the Last Year:   . Arboriculturist in the Last Year:   Transportation Needs:   . Film/video editor (Medical):   Marland Kitchen Lack of Transportation (Non-Medical):   Physical Activity:   . Days of Exercise per Week:   . Minutes of Exercise per Session:   Stress:   . Feeling of Stress :   Social Connections:   . Frequency of Communication with Friends and Family:   . Frequency of Social Gatherings with Friends and Family:   . Attends  Religious Services:   . Active Member of Clubs or Organizations:   . Attends Archivist Meetings:   Marland Kitchen Marital Status:   Intimate Partner Violence:   . Fear of Current or Ex-Partner:   . Emotionally Abused:   Marland Kitchen Physically Abused:   . Sexually Abused:     PHYSICAL EXAM  Vitals:   08/29/19 1302  BP: 121/75  Pulse: 76  Weight: 160 lb (72.6 kg)  Height: 5' 10"  (1.778 m)   Body mass index is 22.96 kg/m.  PHYSICAL EXAMNIATION:  Gen: NAD, conversant, well nourised, well groomed                     Cardiovascular: Regular rate rhythm, no peripheral edema, warm, nontender. Eyes: Conjunctivae clear without exudates or hemorrhage Neck: Supple, no carotid bruits. Pulmonary: Clear to auscultation bilaterally   NEUROLOGICAL EXAM:  MENTAL STATUS: Speech/Cognition: Microphonias, wet soft voice  Montreal Cognitive Assessment  08/29/2019 05/26/2018  Visuospatial/ Executive (0/5) 1 4  Naming (0/3) 3 3  Attention: Read list of digits (0/2) 2 2  Attention: Read list of letters (0/1) 0 1  Attention: Serial 7 subtraction starting at 100 (0/3) 2 0  Language: Repeat  phrase (0/2) 2 1  Language : Fluency (0/1) 0 0  Abstraction (0/2) 2 2  Delayed Recall (0/5) 3 4  Orientation (0/6) 6 6  Total 21 23    CRANIAL NERVES: CN II: Visual fields are full to confrontation.  Pupils are round equal and briskly reactive to light. CN III, IV, VI: extraocular movement are normal. No ptosis. CN V: Facial sensation is intact to light touch. CN VII: Face is symmetric with normal eye closure and smile. CN VIII: Hearing is normal to casual conversation CN IX, X: Palate elevates symmetrically. Phonation is normal. CN XI: Head turning and shoulder shrug are intact CN XII: Tongue is midline with normal movements and no atrophy.  MOTOR: He has constant right upper extremity resting tremor, no weakness, right more than left rigidity, bradykinesia  REFLEXES: Reflexes are 1  and symmetric at the biceps, triceps, knees and ankles. Plantar responses are flexor.  SENSORY: Intact to light touch, pinprick, positional and vibratory sensation at fingers and toes.  COORDINATION: There is no trunk or limb ataxia.    GAIT/STANCE: He can get up from seated position arms crossed, campotphoria, mildly decreased right arm swing, decreased stride, mild En block turning DIAGNOSTIC DATA (LABS, IMAGING, TESTING) - I reviewed patient records, labs, notes, testing and imaging myself where available.   ASSESSMENT AND PLAN 74 y.o. year old male  Idiopathic Parkinson's disease  After discussed with patient, he wants to keep current dose of Sinemet 25/100 mg 3 times a day  Azilect 1 mg daily  Could not tolerate dopamine agonist Requip in the past, complains worsening memory loss,  History of polymyositis  Doing well, there was no muscle weakness noted, continue Imuran 50 mg twice a day  Get laboratory evaluation from his primary care physician Worsening low back pain, gait abnormality,  MRI of lumbar to rule out lumbar radiculopathy  Refer to physical therapy   Marcial Pacas, M.D.  Ph.D.  [facility] [place], [place] [address] Phone: [phone] Fax:      [phone]

## 2019-09-05 ENCOUNTER — Telehealth: Payer: Self-pay | Admitting: Neurology

## 2019-09-05 NOTE — Telephone Encounter (Signed)
Medicare/BCBS auth: [employer] spoke to Corky Crafts Ref # [id number] order sent to [facility]. They will reach out to the patient to schedule.

## 2019-10-04 ENCOUNTER — Other Ambulatory Visit: Payer: Self-pay

## 2019-10-04 ENCOUNTER — Ambulatory Visit
Admission: RE | Admit: 2019-10-04 | Discharge: 2019-10-04 | Disposition: A | Discharge status: Home / Self Care | Payer: Medicare Other | Source: Ambulatory Visit | Attending: Neurology | Admitting: Neurology

## 2019-10-04 DIAGNOSIS — R269 Unspecified abnormalities of gait and mobility: Secondary | ICD-10-CM | POA: Diagnosis not present

## 2019-10-04 DIAGNOSIS — M545 Low back pain, unspecified: Secondary | ICD-10-CM

## 2019-10-04 DIAGNOSIS — M3320 Polymyositis, organ involvement unspecified: Secondary | ICD-10-CM

## 2019-10-04 DIAGNOSIS — G20 Parkinson's disease: Secondary | ICD-10-CM | POA: Diagnosis not present

## 2019-10-04 DIAGNOSIS — G3184 Mild cognitive impairment, so stated: Secondary | ICD-10-CM

## 2019-10-05 ENCOUNTER — Telehealth: Payer: Self-pay | Admitting: *Deleted

## 2019-10-05 NOTE — Telephone Encounter (Signed)
-----   Message from Marcial Pacas, MD sent at 10/05/2019  2:50 PM EDT ----- Please call pt for MRI of lumbar showed no significant abnormalities

## 2019-10-05 NOTE — Telephone Encounter (Signed)
I spoke to the patient and he verbalized understanding of his results. 

## 2019-11-30 DIAGNOSIS — L0390 Cellulitis, unspecified: Secondary | ICD-10-CM | POA: Diagnosis not present

## 2019-11-30 DIAGNOSIS — C44519 Basal cell carcinoma of skin of other part of trunk: Secondary | ICD-10-CM | POA: Diagnosis not present

## 2019-11-30 DIAGNOSIS — L02212 Cutaneous abscess of back [any part, except buttock]: Secondary | ICD-10-CM | POA: Diagnosis not present

## 2019-12-20 ENCOUNTER — Ambulatory Visit: Payer: Medicare Other | Admitting: Physical Therapy

## 2020-01-03 ENCOUNTER — Ambulatory Visit: Payer: Medicare Other | Attending: Neurology | Admitting: Physical Therapy

## 2020-01-03 ENCOUNTER — Other Ambulatory Visit: Payer: Self-pay

## 2020-01-03 DIAGNOSIS — M6281 Muscle weakness (generalized): Secondary | ICD-10-CM | POA: Diagnosis not present

## 2020-01-03 DIAGNOSIS — R2681 Unsteadiness on feet: Secondary | ICD-10-CM | POA: Insufficient documentation

## 2020-01-03 DIAGNOSIS — R293 Abnormal posture: Secondary | ICD-10-CM | POA: Diagnosis not present

## 2020-01-03 DIAGNOSIS — R2689 Other abnormalities of gait and mobility: Secondary | ICD-10-CM

## 2020-01-03 DIAGNOSIS — R29818 Other symptoms and signs involving the nervous system: Secondary | ICD-10-CM | POA: Diagnosis not present

## 2020-01-04 DIAGNOSIS — L821 Other seborrheic keratosis: Secondary | ICD-10-CM | POA: Diagnosis not present

## 2020-01-04 DIAGNOSIS — C44311 Basal cell carcinoma of skin of nose: Secondary | ICD-10-CM | POA: Diagnosis not present

## 2020-01-04 DIAGNOSIS — L578 Other skin changes due to chronic exposure to nonionizing radiation: Secondary | ICD-10-CM | POA: Diagnosis not present

## 2020-01-04 DIAGNOSIS — C44329 Squamous cell carcinoma of skin of other parts of face: Secondary | ICD-10-CM | POA: Diagnosis not present

## 2020-01-04 DIAGNOSIS — D485 Neoplasm of uncertain behavior of skin: Secondary | ICD-10-CM | POA: Diagnosis not present

## 2020-01-04 NOTE — Therapy (Signed)
[facility] [address] [place], [place], [address] Phone: [phone]   Fax:  [phone]  Physical Therapy Evaluation  Patient Details  Name: Alexander Thomas MRN: [id number] Date of Birth: [birth year] Referring Provider (PT): Marcial Pacas   Encounter Date: 01/03/2020   PT End of Session - 01/04/20 2009    Visit Number 1    Number of Visits 18    Date for PT Re-Evaluation 04/02/20    Authorization Type Medicare/BCBS    Progress Note Due on Visit 10    PT Start Time 1706    PT Stop Time 1745    PT Time Calculation (min) 39 min    Activity Tolerance Patient tolerated treatment well    Behavior During Therapy [facility] for tasks assessed/performed           Past Medical History:  Diagnosis Date  . Hypercholesteremia   . Migraine   . Polymyositis ([place])   . Raynaud disease   . Tremor of right hand     Past Surgical History:  Procedure Laterality Date  . CARPAL TUNNEL RELEASE Left   . INGUINAL HERNIA REPAIR Bilateral   . WRIST SURGERY Right     There were no vitals filed for this visit.    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  Wife says I'm leaning over to my R more.  No falls.  Feel that overall walking has slowed down.  Back bothers me alot, especially on my L side.    Pertinent History PD, inflammatory polymyositis, Raynaud's, CTR LUE, R wrist surgery, back pain    Diagnostic tests MRI of low back unremarkable    Patient Stated Goals Pt's goals for therapy are to help pain in back, help tremors in R hand    Currently in Pain? Yes    Pain Score 7     Pain Location Back    Pain Orientation Left;Lower    Pain Descriptors / Indicators Aching    Pain Type Chronic pain    Pain Onset More than a month ago    Pain Frequency Constant    Aggravating Factors  unsure/mowing the yard or more strenuous work     Pain Relieving Factors nothing really    Effect of Pain on Daily Activities Will attempt to address pain through posture exercises and flexibility              [facility] PT Assessment - 01/03/20 1718      Assessment   Medical Diagnosis Parkinson's disease, low back pain    Referring Provider (PT) Marcial Pacas    Onset Date/Surgical Date 08/29/19   MD visit with PT order; PD dx 2016   Hand Dominance Right      Precautions   Precautions Fall      Balance Screen   Has the patient fallen in the past 6 months No    Has the patient had a decrease in activity level because of a fear of falling?  No    Is the patient reluctant to leave their home because of a fear of falling?  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  Decreased functional strength noted with 5x sit<>stand measure      Transfers   Transfers Sit to Stand;Stand to Sit    Sit to Stand 5: Supervision;4: Min guard;Without upper extremity  assist;From chair/3-in-1    Five time sit to stand comments  19.34    Stand to Sit 4: Min guard;5: Supervision;Without upper extremity assist;To chair/3-in-1    Comments Pt has one episode of retropulsion with 5x sit<>stand      Ambulation/Gait   Ambulation/Gait Yes    Ambulation/Gait Assistance 5: Supervision    Ambulation Distance (Feet) 80 Feet    Assistive device None    Gait Pattern Step-through pattern;Decreased arm swing - right;Decreased step length - right;Decreased step length - left;Shuffle;Lateral trunk lean to right;Trunk flexed    Ambulation Surface Level;Indoor    Gait velocity 13.57 sec = 2.42 ft/sec      Standardized Balance Assessment   Standardized Balance Assessment Timed Up and Go Test      Timed Up and Go Test   Normal TUG (seconds) 15.65    Cognitive TUG (seconds) 27.6    TUG Comments Scores > 13.5-15 seconds indicate increased fall risk.  Scores >10% difference indicate increased difficulty with dual tasking.                      Objective measurements completed on examination: See above findings.               PT Education - 01/04/20 2007    Education Details PT Eval results and POC; discussed pt's tremor and possibly needing to address with OT (he is in agreement to request OT order); discussed Sinemet medication (as he reports sometimes he forgots and educated not to take with high protein meals    Person(s) Educated Patient    Methods Explanation    Comprehension Verbalized understanding            PT Short Term Goals - 01/04/20 2020      PT SHORT TERM GOAL #1   Title Pt will be independent with HEP for improved strength, balance, transfers, and gait.  TARGET 02/02/2020    Time 5    Period Weeks    Status New      PT SHORT TERM GOAL #2   Title Pt will improve 5x sit<>stand to less than or equal to 15 seconds for improved functional strength and efficiency of transfers.    Baseline 19.34    Time 5    Period Weeks     Status New  PT SHORT TERM GOAL #3   Title Pt will improve TUG score to less than or equal to 13.5 sec for decreased fall risk.    Baseline 15.65    Time 5    Period Weeks    Status New      PT SHORT TERM GOAL #4   Title Pt will verbalize at least 3 means to decrease low back pain for improved overall functional mobility.    Time 5    Period Weeks      PT SHORT TERM GOAL #5   Title Pt will verbalize understanding of fall prevention in home environment.    Time 5    Period Weeks    Status New             PT Long Term Goals - 01/04/20 2024      PT LONG TERM GOAL #1   Title Pt will be independent with progression of  HEP for improved strength, balance, transfers, and gait.  TARGET 03/01/2020    Time 9    Period Weeks    Status New      PT LONG TERM GOAL #2   Title Pt will improve 5x sit<>stand to less than or equal to 13 seconds for improved transfer efficiency and safety.    Time 9    Period Weeks      PT LONG TERM GOAL #3   Title MiniBESTest score to be assessed and goal to be written as appropriate.    Time 9    Period Weeks    Status New      PT LONG TERM GOAL #4   Title Pt will improve gait velocity to at least 2.62 ft/sec for improved gait efficiency and safety.    Baseline 2.42 ft/sec    Time 9    Period Weeks      PT LONG TERM GOAL #5   Title Pt will verbalize understanding of local Parkinson's disease resources.    Time 9    Period Weeks    Status New                  Plan - 01/04/20 2010    Clinical Impression Statement Pt is a 74 year old male with history of Parkinson's disease since 2016, who presents to OPPT with slowed mobility, balance changes and c/o R hand tremor.  He presents with decreased functional strength, decreased transfer safety and efficiency, decreased balance, abnormal posture, postural instability, bradykinesia, decreased timing and coordination of gait.  He has not had falls, but he is at fall risk per TUG and TUG  cognitive scores.  He is a limited community ambulator based on gait velocity of 2.42 ft/sec.  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  He would likely benefit from OT to addres RUE coordination and fine motor difficulties.  He would benefit from skilled PT to address the above stated deficits for decreased fall risk and overall imrpoved functional mobility.    Personal Factors and Comorbidities Comorbidity 3+    Comorbidities PD, inflammatory polymyositis, Raynaud's, CTR LUE, R wrist surgery, back pain    Examination-Activity Limitations Locomotion Level;Transfers;Stand    Examination-Participation Restrictions Church;Occupation;Community Activity;Interpersonal Relationship    Stability/Clinical Decision Making Evolving/Moderate complexity    Clinical Decision Making Moderate    Rehab Potential Good    PT Frequency 2x / week    PT Duration Other (comment)   8 weeks plus eval  visit = 9 weeks   PT Treatment/Interventions ADLs/Self Care Home Management;Gait training;Stair training;Functional mobility training;Therapeutic activities;Therapeutic exercise;Balance training;Neuromuscular re-education;DME Instruction;Patient/family education    PT Next Visit Plan Perform MiniBESTest and write goal as appropriate; initial HEP to address sit<>stand, posture, balance (seated vs. standing PWR! Moves); work on attention to R hand deliberate movements    Recommended Other Services Occupational therapy to address decreased fine motor/coordination of UEs    Consulted and Agree with Plan of Care Patient           Patient will benefit from skilled therapeutic intervention in order to improve the following deficits and impairments:  Abnormal gait, Difficulty walking, Decreased balance, Decreased mobility, Decreased strength, Postural dysfunction  Visit Diagnosis: Unsteadiness on feet  Other abnormalities of gait and mobility  Muscle weakness  (generalized)  Abnormal posture  Other symptoms and signs involving the nervous system     Problem List Patient Active Problem List   Diagnosis Date Noted  . Gait abnormality 08/29/2019  . Midline low back pain without sciatica 08/29/2019  . Mild cognitive impairment 08/29/2019  . Excessive daytime sleepiness 01/24/2019  . Nocturia more than twice per night 01/24/2019  . Hypersomnia with sleep apnea 01/24/2019  . At risk for central sleep apnea 01/24/2019  . RLS (restless legs syndrome) 01/24/2019  . Idiopathic Parkinson's disease ([place]) 05/07/2015  . Polymyositis ([place]) 05/07/2015    Alexander Gruszka W. 01/04/2020, 8:28 PM Frazier Butt., PT  [facility] [address] [place], [place], [address] Phone: [phone]   Fax:  [phone]  Name: Alexander Thomas MRN: [id number] Date of Birth: Sep 09, 1945

## 2020-01-08 DIAGNOSIS — G20 Parkinson's disease: Secondary | ICD-10-CM | POA: Diagnosis not present

## 2020-01-08 DIAGNOSIS — I7300 Raynaud's syndrome without gangrene: Secondary | ICD-10-CM | POA: Diagnosis not present

## 2020-01-08 DIAGNOSIS — M3322 Polymyositis with myopathy: Secondary | ICD-10-CM | POA: Diagnosis not present

## 2020-01-08 DIAGNOSIS — Z79899 Other long term (current) drug therapy: Secondary | ICD-10-CM | POA: Diagnosis not present

## 2020-01-11 ENCOUNTER — Other Ambulatory Visit: Payer: Self-pay

## 2020-01-11 ENCOUNTER — Encounter: Payer: Self-pay | Admitting: Physical Therapy

## 2020-01-11 ENCOUNTER — Ambulatory Visit: Payer: Medicare Other | Admitting: Physical Therapy

## 2020-01-11 DIAGNOSIS — R29818 Other symptoms and signs involving the nervous system: Secondary | ICD-10-CM

## 2020-01-11 DIAGNOSIS — R2689 Other abnormalities of gait and mobility: Secondary | ICD-10-CM

## 2020-01-11 DIAGNOSIS — R2681 Unsteadiness on feet: Secondary | ICD-10-CM | POA: Diagnosis not present

## 2020-01-11 DIAGNOSIS — R293 Abnormal posture: Secondary | ICD-10-CM | POA: Diagnosis not present

## 2020-01-11 DIAGNOSIS — M6281 Muscle weakness (generalized): Secondary | ICD-10-CM

## 2020-01-11 NOTE — Patient Instructions (Signed)
Sinemet Info  The most potent medication for Parkinson's disease (PD) is levodopa. Its development in the late 1960s represents one of the most important breakthroughs in the history of medicine. Plain levodopa produces nausea and vomiting. It is combined with carbidopa to prevent this side effect. The well-known combined carbidopa/levodopa name brand formulation is called Sinemet. There are many different preparations and strengths of carbidopa/levodopa, including long-acting forms, a combined long and short-acting capsule called Rytary, a formulation that dissolves in the mouth without water, called Parcopa, and a combined formulation that includes the COMT inhibitor entacapone, called Stalevo. It is important that people with PD are aware which levodopa preparation they are taking because there are so many different pill sizes, strengths and manufacturers. Be careful when renewing prescriptions at the pharmacy because the accidental substitute of a different formulation may lead to an overdose or underdose. Carbidopa/levodopa remains the most effective drug to treat PD. The addition of carbidopa prevents levodopa from being converted into dopamine prematurely in the bloodstream, allowing more of it to get to the brain. Therefore, a smaller dose of levodopa is needed to treat symptoms. Some people with PD have been reluctant to take it, believing it to be a last resort. But most neurologists agree that delaying treatment too long is unwise and may put a person with PD at risk for falling and decreased optimal, consistent symptom benefit. The decision about when to start carbidopa/levodopa is different for every person with PD and requires consideration of potential benefits, risks and the availability of alternatives. Unfortunately, with time, patients experience other side effects including dyskinesias (spontaneous, involuntary movements) and "on-off" periods when the medication will suddenly and  unpredictably start or stop working. It is unclear whether this is a symptom of starting the medication at an advanced stage of PD or whether it is related to prolonged use of levodopa (although there is some published evidence for the former explanation). Check with a doctor before taking any of the following to avoid possible interactions: antacids, anti-seizure drugs, anti-hypertensives, anti-depressants and high protein food. The same drugs that interact with carbidopa/levodopa and entacapone interact with Stalevo.  What are the facts? The drug levodopa is synthesized in the brain into dopamine. It is the most important first-line drug for the management of Parkinson's.  Levodopa is almost always given in combination with the drug carbidopa, which prevents the nausea that can be caused by levodopa alone. Carbidopa is also a levodopa enhancer. When added, carbidopa enables a much lower dose of levodopa (80 percent less) and helps reduce the side effects of nausea and vomiting. Pills containing both drugs are often labeled "carbidopa-levodopa," with the active components listed in alphabetical order.  Levodopa in pill form is absorbed in the blood from the small intestine and travels through the blood to the brain, where it is converted into dopamine, needed by the body for movement.  Carbidopa-levodopa tablets are available in immediate-release and slow-release forms as well as dissolvable tablets that are placed under the tongue.  Carbidopa-levodopa immediate/extended release combination capsules (RytaryT) maintain levodopa concentrations longer than the immediate-release or other available oral levodopa formulations. Following an initial peak at about one hour, plasma levodopa concentrations are maintained for about four to five hours before declining. Clinical trials indicate that patients with motor fluctuations on other oral carbidopa-levodopa products may be able to switch to RytaryT and experience  a reduction in "off" time while requiring fewer medication administrations. Carbidopa/levodopa can be taken with or without food, but high fat  meals may delay absorption. Dosages are not interchangeable with dosages other carbidopa-levodopa products. For more information relating to prescribing RytaryT, please see How to Dose Carbidopa and Levodopa Extended-Release Capsules (Rytary), by Dr. Audree Camel. [place] [facility] [place], a Ecologist.  Carbidopa/levodopa is also now available via a dopamine intestinal infusion pump (DUOPAT), which provides 16 continuous hours of carbidopa and levodopa for motor symptoms. The small, portable infusion pump delivers carbidopa and levodopa directly into the small intestine. In a clinical trial, the amount of "on" time without troublesome dyskinesia was better in the pump group when compared to the placebo group (4.1 vs. 2.2 hours). One of the major drawbacks to the pump approach is the need for a percutaneous gastrojejunostomy (a small feeding tube). For more information relating to prescribing DuopaT, please see Carbidopa/Levodopa Enteral Suspension (Duopa) by Luan Pulling, MD, and Winfield Cunas, PhD, of the [facility], a [place].  Common Side Effects Nausea  Vomiting  Loss of appetite  Lightheadedness  Lowered blood pressure  Confusion  Dyskinesia (if used as a long-term therapy; between three to five years)  People who use levodopa long term may experience dyskinesia at some point, usually three to five years after starting the medication.  The term dyskinesia describes involuntary, erratic, writhing movements of the face, arms, legs, and/or trunk, which usually occur one to two hours after a dose of levodopa has been absorbed into the bloodstream and is having its peak clinical effect.  Uncommon Side Effects Sleepiness, sudden onset sleep  Eating Proteins with Levodopa/Sinemet* With more advanced PD, it is best  to take Sinemet 30 to 60 minutes before eating a meal. This allows for quick absorption before food can interfere.  Take the Sinemet along with non-protein foods.  Ginger tea is a good choice for many people, because it often "settles the stomach".  A graham or soda cracker along with ginger tea may help and are low in protein so should not interfere with the absorption of Sinemet.  If you cannot tolerate Sinemet because of nausea, upset stomach, you may need to actually take it with food. Caution: PD medications may have interactions with certain foods, other medications, vitamins, herbal supplements, over the counter cold pills and other remedies. Anyone taking a PD medication should talk to their doctor and pharmacist about potential drug interactions.

## 2020-01-12 NOTE — Therapy (Signed)
[facility] [address] [place], [place], [address] Phone: [phone]   Fax:  [phone]  Physical Therapy Treatment  Patient Details  Name: Alexander Thomas MRN: [id number] Date of Birth: Jun 28, 1945 Referring Provider (PT): Marcial Pacas   Encounter Date: 01/11/2020   PT End of Session - 01/12/20 1226    Visit Number 2    Number of Visits 18    Date for PT Re-Evaluation 04/02/20    Authorization Type Medicare/BCBS    Progress Note Due on Visit 10    PT Start Time 1443    PT Stop Time 1450    PT Time Calculation (min) 48 min    Activity Tolerance Patient tolerated treatment well    Behavior During Therapy [facility] for tasks assessed/performed           Past Medical History:  Diagnosis Date  . Hypercholesteremia   . Migraine   . Polymyositis ([place])   . Raynaud disease   . Tremor of right hand     Past Surgical History:  Procedure Laterality Date  . CARPAL TUNNEL RELEASE Left   . INGUINAL HERNIA REPAIR Bilateral   . WRIST SURGERY Right     There were no vitals filed for this visit.   Subjective Assessment - 01/11/20 1407    Subjective Denies any falls or changes.    Pertinent History PD, inflammatory polymyositis, Raynaud's, CTR LUE, R wrist surgery, back pain    Diagnostic tests MRI of low back unremarkable    Patient Stated Goals Pt's goals for therapy are to help pain in back, help tremors in R hand    Currently in Pain? Yes    Pain Score 7     Pain Location Hip    Pain Orientation Left    Pain Descriptors / Indicators Burning;Other (Comment)   feels deep  "they say it was shingles"   Pain Type Chronic pain    Pain Onset More than a month ago    Pain Relieving Factors lidocane                             [facility] Adult PT Treatment/Exercise - 01/12/20 0001      Balance   Balance Assessed --      Standardized Balance Assessment   Standardized Balance Assessment Mini-BESTest        Mini-BESTest   Sit To Stand Normal: Comes to stand without use of hands and stabilizes independently.    Rise to Toes Moderate: Heels up, but not full range (smaller than when holding hands), OR noticeable instability for 3 s.    Stand on one leg (left) Severe: Unable    Stand on one leg (right) Severe: Unable    Stand on one leg - lowest score 0    Compensatory Stepping Correction - Forward Moderate: More than one step is required to recover equilibrium    Compensatory Stepping Correction - Backward Moderate: More than one step is required to recover equilibrium    Compensatory Stepping Correction - Left Lateral Moderate: Several steps to recover equilibrium    Compensatory Stepping Correction - Right Lateral Moderate: Several steps to recover equilibrium    Stepping Corredtion Lateral - lowest score 1    Stance - Feet together, eyes open, firm surface  Normal: 30s    Stance - Feet together, eyes closed, foam surface  Normal: 30s    Incline - Eyes Closed Normal:  Stands independently 30s and aligns with gravity    Change in Gait Speed Normal: Significantly changes walkling speed without imbalance    Walk with head turns - Horizontal Moderate: performs head turns with reduction in gait speed.    Walk with pivot turns Moderate:Turns with feet close SLOW (>4 steps) with good balance.    Step over obstacles Moderate: Steps over box but touches box OR displays cautious behavior by slowing gait.    Timed UP & GO with Dual Task Severe: Stops counting while walking OR stops walking while counting.    Mini-BEST total score 17      Self-Care   Self-Care Other Self-Care Comments    Other Self-Care Comments  Importance of timing with medications along with not eating protein 30 min before meal.  Discussed results of minibest.             PWR [facility]) - 01/12/20 1224    PWR! exercises Moves in sitting    PWR! Up 20    PWR! Rock 20    PWR! Twist 20    Comments Initiated instruction in [place] moves  sitting today.  Needs cues for intensity and technique.               PT Education - 01/12/20 1225    Education Details importance of timing with medication and not taking with protein meal    Person(s) Educated Patient    Methods Explanation;Handout    Comprehension Verbalized understanding            PT Short Term Goals - 01/04/20 2020      PT SHORT TERM GOAL #1   Title Pt will be independent with HEP for improved strength, balance, transfers, and gait.  TARGET 02/02/2020    Time 5    Period Weeks    Status New      PT SHORT TERM GOAL #2   Title Pt will improve 5x sit<>stand to less than or equal to 15 seconds for improved functional strength and efficiency of transfers.    Baseline 19.34    Time 5    Period Weeks    Status New      PT SHORT TERM GOAL #3   Title Pt will improve TUG score to less than or equal to 13.5 sec for decreased fall risk.    Baseline 15.65    Time 5    Period Weeks    Status New      PT SHORT TERM GOAL #4   Title Pt will verbalize at least 3 means to decrease low back pain for improved overall functional mobility.    Time 5    Period Weeks      PT SHORT TERM GOAL #5   Title Pt will verbalize understanding of fall prevention in home environment.    Time 5    Period Weeks    Status New             PT Long Term Goals - 01/04/20 2024      PT LONG TERM GOAL #1   Title Pt will be independent with progression of  HEP for improved strength, balance, transfers, and gait.  TARGET 03/01/2020    Time 9    Period Weeks    Status New      PT LONG TERM GOAL #2   Title Pt will improve 5x sit<>stand to less than or equal to 13 seconds for improved transfer efficiency and safety.  Time 9    Period Weeks      PT LONG TERM GOAL #3   Title MiniBESTest score to be assessed and goal to be written as appropriate.    Time 9    Period Weeks    Status New      PT LONG TERM GOAL #4   Title Pt will improve gait velocity to at least 2.62  ft/sec for improved gait efficiency and safety.    Baseline 2.42 ft/sec    Time 9    Period Weeks      PT LONG TERM GOAL #5   Title Pt will verbalize understanding of local Parkinson's disease resources.    Time 9    Period Weeks    Status New                 Plan - 01/12/20 1227    Clinical Impression Statement Pt scored 17 on MiniBest today.  Will have primary PT set goal per evaluation.  Unsure of pt's carryover of educational material on evaluation.  Cont per poc.    Personal Factors and Comorbidities Comorbidity 3+    Comorbidities PD, inflammatory polymyositis, Raynaud's, CTR LUE, R wrist surgery, back pain    Examination-Activity Limitations Locomotion Level;Transfers;Stand    Examination-Participation Restrictions Church;Occupation;Community Activity;Interpersonal Relationship    Stability/Clinical Decision Making Evolving/Moderate complexity    Rehab Potential Good    PT Frequency 2x / week    PT Duration Other (comment)   8 weeks plus eval visit = 9 weeks   PT Treatment/Interventions ADLs/Self Care Home Management;Gait training;Stair training;Functional mobility training;Therapeutic activities;Therapeutic exercise;Balance training;Neuromuscular re-education;DME Instruction;Patient/family education    PT Next Visit Plan Initial HEP to address sit<>stand, posture, balance (seated vs. standing PWR! Moves); work on attention to R hand deliberate movements    Consulted and Agree with Plan of Care Patient           Patient will benefit from skilled therapeutic intervention in order to improve the following deficits and impairments:  Abnormal gait, Difficulty walking, Decreased balance, Decreased mobility, Decreased strength, Postural dysfunction  Visit Diagnosis: Other abnormalities of gait and mobility  Unsteadiness on feet  Muscle weakness (generalized)  Abnormal posture  Other symptoms and signs involving the nervous system     Problem List Patient Active  Problem List   Diagnosis Date Noted  . Gait abnormality 08/29/2019  . Midline low back pain without sciatica 08/29/2019  . Mild cognitive impairment 08/29/2019  . Excessive daytime sleepiness 01/24/2019  . Nocturia more than twice per night 01/24/2019  . Hypersomnia with sleep apnea 01/24/2019  . At risk for central sleep apnea 01/24/2019  . RLS (restless legs syndrome) 01/24/2019  . Idiopathic Parkinson's disease ([place]) 05/07/2015  . Polymyositis ([place]) 05/07/2015    Narda Bonds, PTA [facility] 01/12/20 12:30 PM Phone: [phone] Fax: [facility] [address] [place], [place], [address] Phone: [phone]   Fax:  [phone]  Name: MATT DELPIZZO MRN: [id number] Date of Birth: [birth year]

## 2020-01-15 ENCOUNTER — Other Ambulatory Visit: Payer: Self-pay

## 2020-01-15 ENCOUNTER — Encounter: Payer: Self-pay | Admitting: Physical Therapy

## 2020-01-15 ENCOUNTER — Ambulatory Visit: Payer: Medicare Other | Admitting: Physical Therapy

## 2020-01-15 VITALS — BP 124/67 | HR 83

## 2020-01-15 DIAGNOSIS — R293 Abnormal posture: Secondary | ICD-10-CM

## 2020-01-15 DIAGNOSIS — R2681 Unsteadiness on feet: Secondary | ICD-10-CM

## 2020-01-15 DIAGNOSIS — R29818 Other symptoms and signs involving the nervous system: Secondary | ICD-10-CM

## 2020-01-15 DIAGNOSIS — R2689 Other abnormalities of gait and mobility: Secondary | ICD-10-CM | POA: Diagnosis not present

## 2020-01-15 DIAGNOSIS — M6281 Muscle weakness (generalized): Secondary | ICD-10-CM | POA: Diagnosis not present

## 2020-01-15 NOTE — Therapy (Signed)
[facility] [address] [place], [place], [address] Phone: [phone]   Fax:  [phone]  Physical Therapy Treatment  Patient Details  Name: Alexander Thomas MRN: [id number] Date of Birth: February 16, 1946 Referring Provider (PT): Marcial Pacas   Encounter Date: 01/15/2020   PT End of Session - 01/15/20 1423    Visit Number 3    Number of Visits 18    Date for PT Re-Evaluation 04/02/20    Authorization Type Medicare/BCBS    Progress Note Due on Visit 10    PT Start Time 3335    PT Stop Time 1446    PT Time Calculation (min) 42 min    Activity Tolerance Patient tolerated treatment well    Behavior During Therapy [facility] for tasks assessed/performed           Past Medical History:  Diagnosis Date  . Hypercholesteremia   . Migraine   . Polymyositis ([place])   . Raynaud disease   . Tremor of right hand     Past Surgical History:  Procedure Laterality Date  . CARPAL TUNNEL RELEASE Left   . INGUINAL HERNIA REPAIR Bilateral   . WRIST SURGERY Right     Vitals:   01/15/20 1409  BP: 124/67  Pulse: 83     Subjective Assessment - 01/15/20 1409    Subjective The hand exercises have helped.  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    Pertinent History PD, inflammatory polymyositis, Raynaud's, CTR LUE, R wrist surgery, back pain    Diagnostic tests MRI of low back unremarkable    Patient Stated Goals Pt's goals for therapy are to help pain in back, help tremors in R hand    Currently in Pain? Yes    Pain Score 7     Pain Location Hip    Pain Orientation Left    Pain Descriptors / Indicators Sharp;Other (Comment)   feels like it goes all the way through his hip to the bone   Pain Type Chronic pain    Pain Onset More than a month ago    Pain Frequency Constant    Aggravating Factors  unsure    Pain Relieving Factors gabapentin 3 x day helps a little                  [facility] Adult PT  Treatment/Exercise - 01/15/20 0001      Transfers   Transfers Sit to Stand;Stand to Sit    Sit to Stand 5: Supervision;4: Min guard;Without upper extremity assist;From chair/3-in-1    Stand to Sit 7: Independent      Ambulation/Gait   Ambulation/Gait Yes    Ambulation/Gait Assistance 5: Supervision    Ambulation/Gait Assistance Details in/out of clinic and between activities in gym    Assistive device None    Gait Pattern Step-through pattern;Decreased arm swing - right;Decreased step length - right;Decreased step length - left;Shuffle;Lateral trunk lean to right;Trunk flexed    Ambulation Surface Level;Indoor      Posture/Postural Control   Posture/Postural Control Postural limitations    Postural Limitations Rounded Shoulders;Forward head;Flexed trunk;Weight shift right      Exercises   Exercises Knee/Hip      Knee/Hip Exercises: Stretches   Active Hamstring Stretch Both;1 rep;20 seconds      Knee/Hip Exercises: Aerobic   Other Aerobic Scifit level 1.5 all 4 extremities x 15 minutes for flexibility.  rpm >50  Knee/Hip Exercises: Standing   Other Standing Knee Exercises Standing in // bars with bil UE support for bil LE march, straight leg hip flexion, hip abduction x 10-15 reps.  Pt denies any pain with these activities.                    PT Short Term Goals - 01/04/20 2020      PT SHORT TERM GOAL #1   Title Pt will be independent with HEP for improved strength, balance, transfers, and gait.  TARGET 02/02/2020    Time 5    Period Weeks    Status New      PT SHORT TERM GOAL #2   Title Pt will improve 5x sit<>stand to less than or equal to 15 seconds for improved functional strength and efficiency of transfers.    Baseline 19.34    Time 5    Period Weeks    Status New      PT SHORT TERM GOAL #3   Title Pt will improve TUG score to less than or equal to 13.5 sec for decreased fall risk.    Baseline 15.65    Time 5    Period Weeks    Status New       PT SHORT TERM GOAL #4   Title Pt will verbalize at least 3 means to decrease low back pain for improved overall functional mobility.    Time 5    Period Weeks      PT SHORT TERM GOAL #5   Title Pt will verbalize understanding of fall prevention in home environment.    Time 5    Period Weeks    Status New             PT Long Term Goals - 01/04/20 2024      PT LONG TERM GOAL #1   Title Pt will be independent with progression of  HEP for improved strength, balance, transfers, and gait.  TARGET 03/01/2020    Time 9    Period Weeks    Status New      PT LONG TERM GOAL #2   Title Pt will improve 5x sit<>stand to less than or equal to 13 seconds for improved transfer efficiency and safety.    Time 9    Period Weeks      PT LONG TERM GOAL #3   Title MiniBESTest score to be assessed and goal to be written as appropriate.    Time 9    Period Weeks    Status New      PT LONG TERM GOAL #4   Title Pt will improve gait velocity to at least 2.62 ft/sec for improved gait efficiency and safety.    Baseline 2.42 ft/sec    Time 9    Period Weeks      PT LONG TERM GOAL #5   Title Pt will verbalize understanding of local Parkinson's disease resources.    Time 9    Period Weeks    Status New                 Plan - 01/15/20 1556    Clinical Impression Statement Skilled session focused on LE exercises and flexibility to address ROM and strength to determine impact on pt's pain level.  Pt reports decreased c/o L hip pain after session today.  cont per poc.    Personal Factors and Comorbidities Comorbidity 3+    Comorbidities PD, inflammatory polymyositis, Raynaud's, CTR  LUE, R wrist surgery, back pain    Examination-Activity Limitations Locomotion Level;Transfers;Stand    Examination-Participation Restrictions Church;Occupation;Community Activity;Interpersonal Relationship    Stability/Clinical Decision Making Evolving/Moderate complexity    Rehab Potential Good    PT Frequency  2x / week    PT Duration Other (comment)   8 weeks plus eval visit = 9 weeks   PT Treatment/Interventions ADLs/Self Care Home Management;Gait training;Stair training;Functional mobility training;Therapeutic activities;Therapeutic exercise;Balance training;Neuromuscular re-education;DME Instruction;Patient/family education    PT Next Visit Plan How did L hip feel after last session.  Start with Scifit for flexibility of L hip.  Initiate standing HEP and hamstring stetch seated.  Initial HEP to address sit<>stand, posture, balance (seated vs. standing PWR! Moves); work on attention to R hand deliberate movements    Consulted and Agree with Plan of Care Patient           Patient will benefit from skilled therapeutic intervention in order to improve the following deficits and impairments:  Abnormal gait, Difficulty walking, Decreased balance, Decreased mobility, Decreased strength, Postural dysfunction  Visit Diagnosis: Other abnormalities of gait and mobility  Unsteadiness on feet  Muscle weakness (generalized)  Abnormal posture  Other symptoms and signs involving the nervous system     Problem List Patient Active Problem List   Diagnosis Date Noted  . Gait abnormality 08/29/2019  . Midline low back pain without sciatica 08/29/2019  . Mild cognitive impairment 08/29/2019  . Excessive daytime sleepiness 01/24/2019  . Nocturia more than twice per night 01/24/2019  . Hypersomnia with sleep apnea 01/24/2019  . At risk for central sleep apnea 01/24/2019  . RLS (restless legs syndrome) 01/24/2019  . Idiopathic Parkinson's disease ([place]) 05/07/2015  . Polymyositis ([place]) 05/07/2015    Narda Bonds, PTA [facility] 01/15/20 4:00 PM Phone: [phone] Fax: [facility] [address] [place], [place], [address] Phone: [phone]   Fax:  [phone]  Name: Alexander Thomas MRN: [id number] Date of Birth: Sep 16, 1945

## 2020-01-17 ENCOUNTER — Ambulatory Visit: Payer: Medicare Other | Admitting: Physical Therapy

## 2020-01-17 ENCOUNTER — Other Ambulatory Visit: Payer: Self-pay

## 2020-01-17 DIAGNOSIS — M6281 Muscle weakness (generalized): Secondary | ICD-10-CM

## 2020-01-17 DIAGNOSIS — R2689 Other abnormalities of gait and mobility: Secondary | ICD-10-CM

## 2020-01-17 DIAGNOSIS — R29818 Other symptoms and signs involving the nervous system: Secondary | ICD-10-CM | POA: Diagnosis not present

## 2020-01-17 DIAGNOSIS — R2681 Unsteadiness on feet: Secondary | ICD-10-CM

## 2020-01-17 DIAGNOSIS — R293 Abnormal posture: Secondary | ICD-10-CM | POA: Diagnosis not present

## 2020-01-17 NOTE — Patient Instructions (Addendum)
   Perform 1-2x per day.

## 2020-01-17 NOTE — Therapy (Signed)
[facility] [address] [place], [place], [address] Phone: [phone]   Fax:  [phone]  Physical Therapy Treatment  Patient Details  Name: Alexander Thomas MRN: [id number] Date of Birth: January 27, 1946 Referring Provider (PT): Marcial Pacas   Encounter Date: 01/17/2020   PT End of Session - 01/17/20 1702    Visit Number 4    Number of Visits 18    Date for PT Re-Evaluation 04/02/20    Authorization Type Medicare/BCBS    Progress Note Due on Visit 10    PT Start Time 9833    PT Stop Time 1658    PT Time Calculation (min) 42 min    Activity Tolerance Patient tolerated treatment well    Behavior During Therapy [facility] for tasks assessed/performed           Past Medical History:  Diagnosis Date  . Hypercholesteremia   . Migraine   . Polymyositis ([place])   . Raynaud disease   . Tremor of right hand     Past Surgical History:  Procedure Laterality Date  . CARPAL TUNNEL RELEASE Left   . INGUINAL HERNIA REPAIR Bilateral   . WRIST SURGERY Right     There were no vitals filed for this visit.   Subjective Assessment - 01/17/20 1619    Subjective No changes since he was last here.    Pertinent History PD, inflammatory polymyositis, Raynaud's, CTR LUE, R wrist surgery, back pain    Diagnostic tests MRI of low back unremarkable    Patient Stated Goals Pt's goals for therapy are to help pain in back, help tremors in R hand    Currently in Pain? Yes    Pain Score 7     Pain Location Hip    Pain Orientation Left    Pain Descriptors / Indicators Sharp   "like somebody drilled into my hip"   Pain Type Chronic pain   2-3 years   Pain Onset More than a month ago    Aggravating Factors  stress, being worried    Pain Relieving Factors not really.                             [facility] Adult PT Treatment/Exercise - 01/17/20 1624      Transfers   Comments intermittent cues throughout to scoot towards edge of  chair before performing      Knee/Hip Exercises: Aerobic   Other Aerobic SciFit level 1.5 for 6 mins with BUE and BLE for strengthening, activity tolerance, and ROM          NMR:     Pt performs PWR! Moves in sitting position:    PWR! Up for improved posture x10 reps   PWR! Rock for improved weightshifting x5 reps B - with looking up towards hand and then x5 reps B with looking up towards hand and stretching out leg  PWR! Twist for improved trunk rotation 2 x5 reps B - cues to reset in middle each time with tall posture and perform with big, wide hands   PWR! Step for improved step initiation 2 x 5 reps B - step out and out and step in and in, cues for posture   Cues provided for technique and level of intensity with movement - cues for working at a 6-7/10 level.         PT Education - 01/17/20 1655    Education Details initial HEP -  seated PWR moves    Person(s) Educated Patient    Methods Explanation;Demonstration;Handout;Verbal cues    Comprehension Verbalized understanding;Returned demonstration;Need further instruction            PT Short Term Goals - 01/04/20 2020      PT SHORT TERM GOAL #1   Title Pt will be independent with HEP for improved strength, balance, transfers, and gait.  TARGET 02/02/2020    Time 5    Period Weeks    Status New      PT SHORT TERM GOAL #2   Title Pt will improve 5x sit<>stand to less than or equal to 15 seconds for improved functional strength and efficiency of transfers.    Baseline 19.34    Time 5    Period Weeks    Status New      PT SHORT TERM GOAL #3   Title Pt will improve TUG score to less than or equal to 13.5 sec for decreased fall risk.    Baseline 15.65    Time 5    Period Weeks    Status New      PT SHORT TERM GOAL #4   Title Pt will verbalize at least 3 means to decrease low back pain for improved overall functional mobility.    Time 5    Period Weeks      PT SHORT TERM GOAL #5   Title Pt will verbalize  understanding of fall prevention in home environment.    Time 5    Period Weeks    Status New             PT Long Term Goals - 01/04/20 2024      PT LONG TERM GOAL #1   Title Pt will be independent with progression of  HEP for improved strength, balance, transfers, and gait.  TARGET 03/01/2020    Time 9    Period Weeks    Status New      PT LONG TERM GOAL #2   Title Pt will improve 5x sit<>stand to less than or equal to 13 seconds for improved transfer efficiency and safety.    Time 9    Period Weeks      PT LONG TERM GOAL #3   Title MiniBESTest score to be assessed and goal to be written as appropriate.    Time 9    Period Weeks    Status New      PT LONG TERM GOAL #4   Title Pt will improve gait velocity to at least 2.62 ft/sec for improved gait efficiency and safety.    Baseline 2.42 ft/sec    Time 9    Period Weeks      PT LONG TERM GOAL #5   Title Pt will verbalize understanding of local Parkinson's disease resources.    Time 9    Period Weeks    Status New                 Plan - 01/17/20 1704    Clinical Impression Statement Focus of today's skilled session was initiating seated PWR moves for HEP. Needed frequent cues throughout for tall posture and incr intensity of movements. Pt reporting a decr in L hip pain at the end of the session. Will continue to progress towards LTGs.    Personal Factors and Comorbidities Comorbidity 3+    Comorbidities PD, inflammatory polymyositis, Raynaud's, CTR LUE, R wrist surgery, back pain    Examination-Activity Limitations Locomotion Level;Transfers;Stand  Examination-Participation Restrictions Church;Occupation;Community Activity;Interpersonal Relationship    Stability/Clinical Decision Making Evolving/Moderate complexity    Rehab Potential Good    PT Frequency 2x / week    PT Duration Other (comment)   8 weeks plus eval visit = 9 weeks   PT Treatment/Interventions ADLs/Self Care Home Management;Gait  training;Stair training;Functional mobility training;Therapeutic activities;Therapeutic exercise;Balance training;Neuromuscular re-education;DME Instruction;Patient/family education    PT Next Visit Plan review seated PWR, add hamstring stretch to HEP. sit <> stand training, gait training with posture and arm swing. standing PWR. work on attention to R hand deliberate movements    Consulted and Agree with Plan of Care Patient           Patient will benefit from skilled therapeutic intervention in order to improve the following deficits and impairments:  Abnormal gait, Difficulty walking, Decreased balance, Decreased mobility, Decreased strength, Postural dysfunction  Visit Diagnosis: Other abnormalities of gait and mobility  Unsteadiness on feet  Muscle weakness (generalized)  Abnormal posture     Problem List Patient Active Problem List   Diagnosis Date Noted  . Gait abnormality 08/29/2019  . Midline low back pain without sciatica 08/29/2019  . Mild cognitive impairment 08/29/2019  . Excessive daytime sleepiness 01/24/2019  . Nocturia more than twice per night 01/24/2019  . Hypersomnia with sleep apnea 01/24/2019  . At risk for central sleep apnea 01/24/2019  . RLS (restless legs syndrome) 01/24/2019  . Idiopathic Parkinson's disease ([place]) 05/07/2015  . Polymyositis ([place]) 05/07/2015    Arliss Journey, PT, DPT  01/17/2020, 5:05 PM  [facility] [address] [place], [place], [address] Phone: [phone]   Fax:  [phone]  Name: KEISHAWN RAJEWSKI MRN: [id number] Date of Birth: 05-31-1945

## 2020-01-22 ENCOUNTER — Other Ambulatory Visit: Payer: Self-pay

## 2020-01-22 ENCOUNTER — Ambulatory Visit: Payer: Medicare Other | Attending: Neurology | Admitting: Physical Therapy

## 2020-01-22 DIAGNOSIS — R2689 Other abnormalities of gait and mobility: Secondary | ICD-10-CM | POA: Insufficient documentation

## 2020-01-22 DIAGNOSIS — R293 Abnormal posture: Secondary | ICD-10-CM | POA: Insufficient documentation

## 2020-01-22 DIAGNOSIS — R29818 Other symptoms and signs involving the nervous system: Secondary | ICD-10-CM | POA: Diagnosis not present

## 2020-01-22 DIAGNOSIS — M6281 Muscle weakness (generalized): Secondary | ICD-10-CM | POA: Insufficient documentation

## 2020-01-22 DIAGNOSIS — R2681 Unsteadiness on feet: Secondary | ICD-10-CM | POA: Diagnosis not present

## 2020-01-22 NOTE — Therapy (Signed)
[facility] [address] [place], [place], [address] Phone: [phone]   Fax:  [phone]  Physical Therapy Treatment  Patient Details  Name: Alexander Thomas MRN: [id number] Date of Birth: Sep 19, 1945 Referring Provider (PT): Marcial Pacas   Encounter Date: 01/22/2020   PT End of Session - 01/22/20 1423    Visit Number 5    Number of Visits 18    Date for PT Re-Evaluation 04/02/20    Authorization Type Medicare/BCBS    Progress Note Due on Visit 10    PT Start Time 8099    PT Stop Time 1312    PT Time Calculation (min) 41 min    Activity Tolerance Patient tolerated treatment well    Behavior During Therapy [facility] for tasks assessed/performed           Past Medical History:  Diagnosis Date   Hypercholesteremia    Migraine    Polymyositis ([facility])    Raynaud disease    Tremor of right hand     Past Surgical History:  Procedure Laterality Date   CARPAL TUNNEL RELEASE Left    INGUINAL HERNIA REPAIR Bilateral    WRIST SURGERY Right     There were no vitals filed for this visit.   Subjective Assessment - 01/22/20 1233    Subjective No changes since he was here. Tried to do the exercises at home.    Pertinent History PD, inflammatory polymyositis, Raynaud's, CTR LUE, R wrist surgery, back pain    Diagnostic tests MRI of low back unremarkable    Patient Stated Goals Pt's goals for therapy are to help pain in back, help tremors in R hand    Currently in Pain? Yes    Pain Score 7     Pain Location Hip    Pain Orientation Left    Pain Descriptors / Indicators Sharp    Pain Type Chronic pain    Pain Onset More than a month ago    Pain Relieving Factors roll on lidocaine                             [facility] Adult PT Treatment/Exercise - 01/22/20 1310      Ambulation/Gait   Ambulation/Gait Yes    Ambulation/Gait Assistance 5: Supervision    Ambulation/Gait Assistance Details performed  230' with walking poles (therapist holding poles posteriorly while facilitating arm swing and trunk rotation), when removing poles pt reverts back to decr arm swing B, pt did respond well to tactile cues to swing back in posterior direction to tap to therapist's hand. cues throughout for posture and improved step length B    Ambulation Distance (Feet) 330 Feet    Assistive device None   B walking poles   Gait Pattern Step-through pattern;Decreased arm swing - right;Decreased step length - right;Decreased step length - left;Shuffle;Lateral trunk lean to right;Trunk flexed    Ambulation Surface Level;Indoor      High Level Balance   High Level Balance Comments With single UE support with mirror as visual feedback: staggered stance weight shifting x8 reps with BLE only, cues for weight shift, posture and hip hinge then performing with opposite UE lift x10 reps B with cues for arm swing and use of mirror for upright posture when shifting forwards              NMR:     Pt performs PWR! Moves in sitting position (reviewed  as previous HEP)   PWR! Up for improved posture 2 x10 reps - 2nd set of 10 performed with mirror as visual feedback for midline and upright posture   PWR! Rock for improved weightshifting x5 reps B - coming down onto thigh and reaching arm overhead x7 reps B, did not perform today with kicking leg out  PWR! Twist for improved trunk rotation 2 x5 reps B - cues to reset in middle each time with tall posture and perform with big, wide hands   PWR! Step for improved step initiation  x5 reps B - step out and out and step in and in, cues for posture   Verbal and demo cues for technique.         PT Short Term Goals - 01/04/20 2020      PT SHORT TERM GOAL #1   Title Pt will be independent with HEP for improved strength, balance, transfers, and gait.  TARGET 02/02/2020    Time 5    Period Weeks    Status New      PT SHORT TERM GOAL #2   Title Pt will improve 5x  sit<>stand to less than or equal to 15 seconds for improved functional strength and efficiency of transfers.    Baseline 19.34    Time 5    Period Weeks    Status New      PT SHORT TERM GOAL #3   Title Pt will improve TUG score to less than or equal to 13.5 sec for decreased fall risk.    Baseline 15.65    Time 5    Period Weeks    Status New      PT SHORT TERM GOAL #4   Title Pt will verbalize at least 3 means to decrease low back pain for improved overall functional mobility.    Time 5    Period Weeks      PT SHORT TERM GOAL #5   Title Pt will verbalize understanding of fall prevention in home environment.    Time 5    Period Weeks    Status New             PT Long Term Goals - 01/04/20 2024      PT LONG TERM GOAL #1   Title Pt will be independent with progression of  HEP for improved strength, balance, transfers, and gait.  TARGET 03/01/2020    Time 9    Period Weeks    Status New      PT LONG TERM GOAL #2   Title Pt will improve 5x sit<>stand to less than or equal to 13 seconds for improved transfer efficiency and safety.    Time 9    Period Weeks      PT LONG TERM GOAL #3   Title MiniBESTest score to be assessed and goal to be written as appropriate.    Time 9    Period Weeks    Status New      PT LONG TERM GOAL #4   Title Pt will improve gait velocity to at least 2.62 ft/sec for improved gait efficiency and safety.    Baseline 2.42 ft/sec    Time 9    Period Weeks      PT LONG TERM GOAL #5   Title Pt will verbalize understanding of local Parkinson's disease resources.    Time 9    Period Weeks    Status New  Plan - 01/22/20 1421    Clinical Impression Statement Reviewed seated PWR moves as HEP, pt needing initial demo cues on technique. Attempted gait training with walking poles with therapist facilitation to help with posture, arm swing, and trunk rotation, however with poles removed pt returns to decr arm swing B. Did  respond well to tactile cues for swinging of arms posteriorly to tap therapist's hand. Will continue to progress towards LTGs.    Personal Factors and Comorbidities Comorbidity 3+    Comorbidities PD, inflammatory polymyositis, Raynaud's, CTR LUE, R wrist surgery, back pain    Examination-Activity Limitations Locomotion Level;Transfers;Stand    Examination-Participation Restrictions Church;Occupation;Community Activity;Interpersonal Relationship    Stability/Clinical Decision Making Evolving/Moderate complexity    Rehab Potential Good    PT Frequency 2x / week    PT Duration Other (comment)   8 weeks plus eval visit = 9 weeks   PT Treatment/Interventions ADLs/Self Care Home Management;Gait training;Stair training;Functional mobility training;Therapeutic activities;Therapeutic exercise;Balance training;Neuromuscular re-education;DME Instruction;Patient/family education    PT Next Visit Plan add hamstring stretch to HEP. sit <> stand training, gait training with posture and arm swing. standing PWR. work on attention to R hand deliberate movements    Consulted and Agree with Plan of Care Patient           Patient will benefit from skilled therapeutic intervention in order to improve the following deficits and impairments:  Abnormal gait, Difficulty walking, Decreased balance, Decreased mobility, Decreased strength, Postural dysfunction  Visit Diagnosis: Other abnormalities of gait and mobility  Unsteadiness on feet  Muscle weakness (generalized)  Abnormal posture     Problem List Patient Active Problem List   Diagnosis Date Noted   Gait abnormality 08/29/2019   Midline low back pain without sciatica 08/29/2019   Mild cognitive impairment 08/29/2019   Excessive daytime sleepiness 01/24/2019   Nocturia more than twice per night 01/24/2019   Hypersomnia with sleep apnea 01/24/2019   At risk for central sleep apnea 01/24/2019   RLS (restless legs syndrome) 01/24/2019    Idiopathic Parkinson's disease ([place]) 05/07/2015   Polymyositis ([place]) 05/07/2015    Arliss Journey, PT, DPT  01/22/2020, 2:26 PM  [facility] [address] [place], [place], [address] Phone: [phone]   Fax:  [phone]  Name: Alexander Thomas MRN: [id number] Date of Birth: 24-Apr-1945

## 2020-01-24 ENCOUNTER — Ambulatory Visit: Payer: Medicare Other | Admitting: Physical Therapy

## 2020-01-24 ENCOUNTER — Other Ambulatory Visit: Payer: Self-pay

## 2020-01-24 DIAGNOSIS — R2681 Unsteadiness on feet: Secondary | ICD-10-CM | POA: Diagnosis not present

## 2020-01-24 DIAGNOSIS — R293 Abnormal posture: Secondary | ICD-10-CM

## 2020-01-24 DIAGNOSIS — M6281 Muscle weakness (generalized): Secondary | ICD-10-CM | POA: Diagnosis not present

## 2020-01-24 DIAGNOSIS — R2689 Other abnormalities of gait and mobility: Secondary | ICD-10-CM

## 2020-01-24 DIAGNOSIS — R29818 Other symptoms and signs involving the nervous system: Secondary | ICD-10-CM | POA: Diagnosis not present

## 2020-01-24 NOTE — Therapy (Addendum)
[facility] [address] [place], [place], [address] Phone: [phone]   Fax:  [phone]  Physical Therapy Treatment  Patient Details  Name: Alexander Thomas MRN: [id number] Date of Birth: 10-09-1945 Referring Provider (PT): Marcial Pacas   Encounter Date: 01/24/2020   PT End of Session - 01/24/20 1652    Visit Number 6    Number of Visits 18    Date for PT Re-Evaluation 04/02/20    Authorization Type Medicare/BCBS    Progress Note Due on Visit 10    PT Start Time 1600   Pt arrived late; PT able to shift schedule to see patient   PT Stop Time 1644    PT Time Calculation (min) 44 min    Activity Tolerance Patient tolerated treatment well    Behavior During Therapy [facility] for tasks assessed/performed           Past Medical History:  Diagnosis Date  . Hypercholesteremia   . Migraine   . Polymyositis ([place])   . Raynaud disease   . Tremor of right hand     Past Surgical History:  Procedure Laterality Date  . CARPAL TUNNEL RELEASE Left   . INGUINAL HERNIA REPAIR Bilateral   . WRIST SURGERY Right     There were no vitals filed for this visit.   Subjective Assessment - 01/24/20 1601    Subjective No changes; the L hip pain still hurts me, but the exercises don't aggravate.    Pertinent History PD, inflammatory polymyositis, Raynaud's, CTR LUE, R wrist surgery, back pain    Diagnostic tests MRI of low back unremarkable    Patient Stated Goals Pt's goals for therapy are to help pain in back, help tremors in R hand    Currently in Pain? Yes    Pain Score 7     Pain Location Hip    Pain Orientation Left    Pain Descriptors / Indicators Sharp    Pain Type Chronic pain    Pain Onset More than a month ago    Pain Frequency Constant    Aggravating Factors  shingles-type pain    Pain Relieving Factors cream                             [facility] Adult PT Treatment/Exercise - 01/24/20 1630       Ambulation/Gait   Ambulation/Gait Yes    Ambulation/Gait Assistance 5: Supervision    Ambulation/Gait Assistance Details 175 ft of gait with bilateral walking poles to facilitate arm swing, with PT providing cues for increased step length and upright posture.  With poles removed, PT provides occasional VCs for posture, step length, arm swing.    Ambulation Distance (Feet) 230 Feet   350   Assistive device None    Gait Pattern Step-through pattern;Decreased arm swing - right;Decreased step length - right;Decreased step length - left;Shuffle;Lateral trunk lean to right;Trunk flexed    Ambulation Surface Level;Indoor    Gait Comments Pt asks about outdoor gait, PT educates (verbally) for patient to walk laps indoors focusing on posture, arm swing, step length.           Neuro Re-education: Pt performs PWR! Moves in sitting position    PWR! Up for improved posture - Initial set of 5 reps with slow, sustained motion, then10 performed with mirror as visual feedback for midline and upright posture    PWR! Rock for improved weightshifting x10  reps Bilat - coming down onto thigh and reaching arm overhead x10 reps each side   PWR! Twist for improved trunk rotation 2 x5 reps Bilat - cues to reset in middle each time with tall posture and perform with big, wide hands    PWR! Step for improved step initiation  x5 reps Bilat - step out and out and step in and in, cues for posture      Pt performs PWR! Moves in standing position   PWR! Up for improved posture x 10 reps, chair in front for support  PWR! Rock for improved weighshifting, x 10 reps each side, starting with weightshift through hips, then with added reach  PWR! Step for improved step initiation x 8 reps each side, with coordinated UE reach, cues for step height/foot clearance.  Cues provided for technique, posture, intensity of movement      Balance Exercises - 01/24/20 0001      Balance Exercises: Standing   Stepping Strategy  Anterior;Posterior;Lateral;UE support;5 reps;Limitations    Stepping Strategy Limitations With step strategy to side and forward, used 2" flat black obstacle to step over; more difficulty with side step over obstacle, needing to reposition feet.               PT Short Term Goals - 01/04/20 2020      PT SHORT TERM GOAL #1   Title Pt will be independent with HEP for improved strength, balance, transfers, and gait.  TARGET 02/02/2020    Time 5    Period Weeks    Status New      PT SHORT TERM GOAL #2   Title Pt will improve 5x sit<>stand to less than or equal to 15 seconds for improved functional strength and efficiency of transfers.    Baseline 19.34    Time 5    Period Weeks    Status New      PT SHORT TERM GOAL #3   Title Pt will improve TUG score to less than or equal to 13.5 sec for decreased fall risk.    Baseline 15.65    Time 5    Period Weeks    Status New      PT SHORT TERM GOAL #4   Title Pt will verbalize at least 3 means to decrease low back pain for improved overall functional mobility.    Time 5    Period Weeks      PT SHORT TERM GOAL #5   Title Pt will verbalize understanding of fall prevention in home environment.    Time 5    Period Weeks    Status New             PT Long Term Goals - 01/04/20 2024      PT LONG TERM GOAL #1   Title Pt will be independent with progression of  HEP for improved strength, balance, transfers, and gait.  TARGET 03/01/2020    Time 9    Period Weeks    Status New      PT LONG TERM GOAL #2   Title Pt will improve 5x sit<>stand to less than or equal to 13 seconds for improved transfer efficiency and safety.    Time 9    Period Weeks      PT LONG TERM GOAL #3   Title MiniBESTest score to be assessed and goal to be written as appropriate.    Time 9    Period Weeks    Status New  PT LONG TERM GOAL #4   Title Pt will improve gait velocity to at least 2.62 ft/sec for improved gait efficiency and safety.     Baseline 2.42 ft/sec    Time 9    Period Weeks      PT LONG TERM GOAL #5   Title Pt will verbalize understanding of local Parkinson's disease resources.    Time 9    Period Weeks    Status New                 Plan - 01/24/20 1654    Clinical Impression Statement Continued to work on [employer]! Moves-in sitting and in standing today, with cues (verbal and visual) for posture and intensity of movement.  With gait, he continues to need cues for arm swing, step length, posture.    Personal Factors and Comorbidities Comorbidity 3+    Comorbidities PD, inflammatory polymyositis, Raynaud's, CTR LUE, R wrist surgery, back pain    Examination-Activity Limitations Locomotion Level;Transfers;Stand    Examination-Participation Restrictions Church;Occupation;Community Activity;Interpersonal Relationship    Stability/Clinical Decision Making Evolving/Moderate complexity    Rehab Potential Good    PT Frequency 2x / week    PT Duration Other (comment)   8 weeks plus eval visit = 9 weeks   PT Treatment/Interventions ADLs/Self Care Home Management;Gait training;Stair training;Functional mobility training;Therapeutic activities;Therapeutic exercise;Balance training;Neuromuscular re-education;DME Instruction;Patient/family education    PT Next Visit Plan Work on hamstring stretch and add to HEP. sit <> stand training, gait training with posture and arm swing. standing PWR. work on attention to R hand deliberate movements    Consulted and Agree with Plan of Care Patient           Patient will benefit from skilled therapeutic intervention in order to improve the following deficits and impairments:  Abnormal gait, Difficulty walking, Decreased balance, Decreased mobility, Decreased strength, Postural dysfunction  Visit Diagnosis: Other abnormalities of gait and mobility  Unsteadiness on feet  Abnormal posture     Problem List Patient Active Problem List   Diagnosis Date Noted  . Gait  abnormality 08/29/2019  . Midline low back pain without sciatica 08/29/2019  . Mild cognitive impairment 08/29/2019  . Excessive daytime sleepiness 01/24/2019  . Nocturia more than twice per night 01/24/2019  . Hypersomnia with sleep apnea 01/24/2019  . At risk for central sleep apnea 01/24/2019  . RLS (restless legs syndrome) 01/24/2019  . Idiopathic Parkinson's disease ([place]) 05/07/2015  . Polymyositis ([place]) 05/07/2015    Mya Suell W. 01/24/2020, 4:56 PM Frazier Butt., PT [facility] [address] [place], [place], [address] Phone: [phone]   Fax:  [phone]  Name: Alexander Thomas MRN: [id number] Date of Birth: 1945/06/09

## 2020-01-29 ENCOUNTER — Ambulatory Visit: Payer: Medicare Other | Admitting: Physical Therapy

## 2020-01-29 ENCOUNTER — Other Ambulatory Visit: Payer: Self-pay

## 2020-01-29 DIAGNOSIS — R2681 Unsteadiness on feet: Secondary | ICD-10-CM | POA: Diagnosis not present

## 2020-01-29 DIAGNOSIS — R2689 Other abnormalities of gait and mobility: Secondary | ICD-10-CM

## 2020-01-29 DIAGNOSIS — R293 Abnormal posture: Secondary | ICD-10-CM | POA: Diagnosis not present

## 2020-01-29 DIAGNOSIS — M6281 Muscle weakness (generalized): Secondary | ICD-10-CM

## 2020-01-29 DIAGNOSIS — R29818 Other symptoms and signs involving the nervous system: Secondary | ICD-10-CM

## 2020-01-29 NOTE — Patient Instructions (Signed)
Access Code: [email or web address] URL: [email or web address] Date: 01/29/2020 Prepared by: Mady Haagensen  Exercises Seated Hamstring Stretch - 1 x daily - 7 x weekly - 1 sets - 3 reps - 30 sec hold Sit to Stand with Hands on Knees - 1 x daily - 7 x weekly - 1 sets - 10 reps

## 2020-01-30 NOTE — Therapy (Signed)
[facility] [address] [place], [place], [address] Phone: [phone]   Fax:  [phone]  Physical Therapy Treatment  Patient Details  Name: Alexander Thomas MRN: [id number] Date of Birth: [birth year] Referring Provider (PT): Marcial Pacas   Encounter Date: 01/29/2020   PT End of Session - 01/30/20 1813    Visit Number 7    Number of Visits 18    Date for PT Re-Evaluation 04/02/20    Authorization Type Medicare/BCBS    Progress Note Due on Visit 10    PT Start Time 3875    PT Stop Time 1443    PT Time Calculation (min) 39 min    Activity Tolerance Patient tolerated treatment well    Behavior During Therapy [facility] for tasks assessed/performed           Past Medical History:  Diagnosis Date  . Hypercholesteremia   . Migraine   . Polymyositis ([place])   . Raynaud disease   . Tremor of right hand     Past Surgical History:  Procedure Laterality Date  . CARPAL TUNNEL RELEASE Left   . INGUINAL HERNIA REPAIR Bilateral   . WRIST SURGERY Right     There were no vitals filed for this visit.   Subjective Assessment - 01/29/20 1406    Subjective No changes.  Feel like I'm maybe a little more aware of how I move.  The movements we do in the exercises help that hip pain.  Otherwise, it's with me wherever I go.    Pertinent History PD, inflammatory polymyositis, Raynaud's, CTR LUE, R wrist surgery, back pain; ?shingles L hip pain    Diagnostic tests MRI of low back unremarkable    Patient Stated Goals Pt's goals for therapy are to help pain in back, help tremors in R hand    Currently in Pain? Yes    Pain Score 7     Pain Location Hip    Pain Orientation Left    Pain Descriptors / Indicators Sharp    Pain Type Chronic pain    Pain Onset More than a month ago    Pain Frequency Constant    Aggravating Factors  shingles-type pain    Pain Relieving Factors Cream                             [facility]  Adult PT Treatment/Exercise - 01/29/20 1408      Transfers   Transfers Sit to Stand;Stand to Sit    Sit to Stand 5: Supervision;4: Min guard;Without upper extremity assist;From chair/3-in-1    Sit to Stand Details Verbal cues for sequencing;Verbal cues for technique    Sit to Stand Details (indicate cue type and reason) Cues for scooting, forward lean, upright stand    Stand to Sit 5: Supervision    Stand to Sit Details (indicate cue type and reason) Verbal cues for technique;Verbal cues for sequencing    Stand to Sit Details Cues for slowed descent    Comments Practiced at least 10 reps, throughout session with cues for full knee extension, upright posture through standing.      Ambulation/Gait   Ambulation/Gait Yes    Ambulation/Gait Assistance 5: Supervision    Ambulation/Gait Assistance Details Cues for bilateral arm swing and increased step length.  Used bilateral walking poles to help with reciprocal arm swing.    Ambulation Distance (Feet) 230 Feet   400   Assistive  device None    Gait Pattern Step-through pattern;Decreased arm swing - right;Decreased step length - right;Decreased step length - left;Shuffle;Lateral trunk lean to right;Trunk flexed    Ambulation Surface Level;Indoor      Knee/Hip Exercises: Stretches   Active Hamstring Stretch Right;Left;3 reps;30 seconds    Active Hamstring Stretch Limitations last rep, foot propped on 4" block   Cues for technique          Pt performs PWR! Moves in standing position    PWR! Up for improved posture x 10 reps   PWR! Rock for improved weighshifting x 10 reps, each side, with initial weigthshifting through lower extremities, then added reach  PWR! Twist for improved trunk rotation x 10 reps each side, performed reaching across to target at cabinet.  Also performed in corner, reaching across body to wall, for improved trunk rotation, x 5 reps each side.  PWR! Step for improved step initiation x 10 reps with cues for increased  step length and foot clearance.  Cues provided for technique, intensity of movement patterns.            PT Education - 01/30/20 1812    Education Details Additions to [employer]) Educated Patient    Methods Explanation;Demonstration;Handout    Comprehension Verbalized understanding;Returned demonstration            PT Short Term Goals - 01/04/20 2020      PT SHORT TERM GOAL #1   Title Pt will be independent with HEP for improved strength, balance, transfers, and gait.  TARGET 02/02/2020    Time 5    Period Weeks    Status New      PT SHORT TERM GOAL #2   Title Pt will improve 5x sit<>stand to less than or equal to 15 seconds for improved functional strength and efficiency of transfers.    Baseline 19.34    Time 5    Period Weeks    Status New      PT SHORT TERM GOAL #3   Title Pt will improve TUG score to less than or equal to 13.5 sec for decreased fall risk.    Baseline 15.65    Time 5    Period Weeks    Status New      PT SHORT TERM GOAL #4   Title Pt will verbalize at least 3 means to decrease low back pain for improved overall functional mobility.    Time 5    Period Weeks      PT SHORT TERM GOAL #5   Title Pt will verbalize understanding of fall prevention in home environment.    Time 5    Period Weeks    Status New             PT Long Term Goals - 01/04/20 2024      PT LONG TERM GOAL #1   Title Pt will be independent with progression of  HEP for improved strength, balance, transfers, and gait.  TARGET 03/01/2020    Time 9    Period Weeks    Status New      PT LONG TERM GOAL #2   Title Pt will improve 5x sit<>stand to less than or equal to 13 seconds for improved transfer efficiency and safety.    Time 9    Period Weeks      PT LONG TERM GOAL #3   Title MiniBESTest score to be assessed and goal to be written as  appropriate.    Time 9    Period Weeks    Status New      PT LONG TERM GOAL #4   Title Pt will improve gait velocity  to at least 2.62 ft/sec for improved gait efficiency and safety.    Baseline 2.42 ft/sec    Time 9    Period Weeks      PT LONG TERM GOAL #5   Title Pt will verbalize understanding of local Parkinson's disease resources.    Time 9    Period Weeks    Status New                 Plan - 01/30/20 1813    Clinical Impression Statement Continued work on [employer]! Moves exercises this visit, with focus also on seated hamstring stretch and sit<>stand technique.  Pt improved with cues and repetition.  Will continue to benefit from skilled PT to further address balance, strength, posture and gait towards goals.    Personal Factors and Comorbidities Comorbidity 3+    Comorbidities PD, inflammatory polymyositis, Raynaud's, CTR LUE, R wrist surgery, back pain    Examination-Activity Limitations Locomotion Level;Transfers;Stand    Examination-Participation Restrictions Church;Occupation;Community Activity;Interpersonal Relationship    Stability/Clinical Decision Making Evolving/Moderate complexity    Rehab Potential Good    PT Frequency 2x / week    PT Duration Other (comment)   8 weeks plus eval visit = 9 weeks   PT Treatment/Interventions ADLs/Self Care Home Management;Gait training;Stair training;Functional mobility training;Therapeutic activities;Therapeutic exercise;Balance training;Neuromuscular re-education;DME Instruction;Patient/family education    PT Next Visit Plan Begin checking STGs; Review hamstring stretch and sit <> stand training, gait training with posture and arm swing. standing PWR (try to add to HEP). work on attention to R hand deliberate movements    Recommended Other Services In discussion previous visit, pt declines OT at this time.    Consulted and Agree with Plan of Care Patient           Patient will benefit from skilled therapeutic intervention in order to improve the following deficits and impairments:  Abnormal gait, Difficulty walking, Decreased balance, Decreased  mobility, Decreased strength, Postural dysfunction  Visit Diagnosis: Other abnormalities of gait and mobility  Unsteadiness on feet  Abnormal posture  Muscle weakness (generalized)  Other symptoms and signs involving the nervous system     Problem List Patient Active Problem List   Diagnosis Date Noted  . Gait abnormality 08/29/2019  . Midline low back pain without sciatica 08/29/2019  . Mild cognitive impairment 08/29/2019  . Excessive daytime sleepiness 01/24/2019  . Nocturia more than twice per night 01/24/2019  . Hypersomnia with sleep apnea 01/24/2019  . At risk for central sleep apnea 01/24/2019  . RLS (restless legs syndrome) 01/24/2019  . Idiopathic Parkinson's disease ([place]) 05/07/2015  . Polymyositis ([place]) 05/07/2015    Adelaida Reindel W. 01/30/2020, 6:20 PM  Mady Haagensen, PT 01/30/20 6:21 PM Phone: [phone] Fax: [facility] [address] [place], [place], [address] Phone: [phone]   Fax:  [phone]  Name: Alexander Thomas MRN: [id number] Date of Birth: 1946-04-06

## 2020-01-31 ENCOUNTER — Other Ambulatory Visit: Payer: Self-pay

## 2020-01-31 ENCOUNTER — Ambulatory Visit: Payer: Medicare Other | Admitting: Physical Therapy

## 2020-01-31 DIAGNOSIS — M6281 Muscle weakness (generalized): Secondary | ICD-10-CM | POA: Diagnosis not present

## 2020-01-31 DIAGNOSIS — R2681 Unsteadiness on feet: Secondary | ICD-10-CM | POA: Diagnosis not present

## 2020-01-31 DIAGNOSIS — R2689 Other abnormalities of gait and mobility: Secondary | ICD-10-CM

## 2020-01-31 DIAGNOSIS — R293 Abnormal posture: Secondary | ICD-10-CM

## 2020-01-31 DIAGNOSIS — R29818 Other symptoms and signs involving the nervous system: Secondary | ICD-10-CM | POA: Diagnosis not present

## 2020-01-31 NOTE — Patient Instructions (Signed)

## 2020-01-31 NOTE — Therapy (Signed)
[facility] [address] [place], [place], [address] Phone: [phone]   Fax:  [phone]  Physical Therapy Treatment  Patient Details  Name: Alexander Thomas MRN: [id number] Date of Birth: [birth year] Referring Provider (PT): Marcial Pacas   Encounter Date: 01/31/2020   PT End of Session - 01/31/20 1849    Visit Number 8    Number of Visits 18    Date for PT Re-Evaluation 04/02/20    Authorization Type Medicare/BCBS    Progress Note Due on Visit 10    PT Start Time 1532    PT Stop Time 1614    PT Time Calculation (min) 42 min    Activity Tolerance Patient tolerated treatment well    Behavior During Therapy [facility] for tasks assessed/performed           Past Medical History:  Diagnosis Date  . Hypercholesteremia   . Migraine   . Polymyositis ([place])   . Raynaud disease   . Tremor of right hand     Past Surgical History:  Procedure Laterality Date  . CARPAL TUNNEL RELEASE Left   . INGUINAL HERNIA REPAIR Bilateral   . WRIST SURGERY Right     There were no vitals filed for this visit.   Subjective Assessment - 01/31/20 1536    Subjective Been doing more exercises today and yesterday; trying to get more into a routine.    Pertinent History PD, inflammatory polymyositis, Raynaud's, CTR LUE, R wrist surgery, back pain; ?shingles L hip pain    Diagnostic tests MRI of low back unremarkable    Patient Stated Goals Pt's goals for therapy are to help pain in back, help tremors in R hand    Currently in Pain? Yes    Pain Score 7     Pain Location Hip    Pain Orientation Left    Pain Descriptors / Indicators Sharp    Pain Type Chronic pain    Pain Onset More than a month ago    Pain Frequency Constant    Aggravating Factors  shingles-type pain    Pain Relieving Factors cream/Gabapentin                             [facility] Adult PT Treatment/Exercise - 01/31/20 1546      Transfers   Transfers  Sit to Stand;Stand to Sit    Sit to Stand 5: Supervision;With upper extremity assist;Without upper extremity assist;From bed;From chair/3-in-1    Sit to Stand Details (indicate cue type and reason) Pt return demo understanding of sit<>stand technique    Five time sit to stand comments  15.09   no UE support from chair   Stand to Sit 5: Supervision    Number of Reps 2 sets;Other reps (comment)   5 reps each from mat, then from chair     Ambulation/Gait   Ambulation/Gait Yes    Ambulation/Gait Assistance 5: Supervision    Ambulation/Gait Assistance Details Conversational tasks with giat, slightly slowed pace with conversation.    Ambulation Distance (Feet) 400 Feet   100; additional 200 ft   Assistive device None    Gait Pattern Step-through pattern;Decreased arm swing - right;Decreased step length - right;Decreased step length - left;Shuffle;Lateral trunk lean to right;Trunk flexed    Ambulation Surface Level;Indoor      Standardized Balance Assessment   Standardized Balance Assessment Timed Up and Go Test      Timed  Up and Go Test   TUG Normal TUG    Normal TUG (seconds) 14.4   13.25 at best     Self-Care   Self-Care Other Self-Care Comments    Other Self-Care Comments  Discussed fall prevention in home environment.  Discussed safety getting in and out of the bed.  Discussed POC and progress towards goals checked today.  Pt in agreement for scheduling more appointments to continue with POC.             Neuro Re-education:   Pt performs PWR! Moves in standing position    PWR! Up for improved posture x 10 reps, cues for terminal knee extension  PWR! Rock for improved weighshifting x 10 reps, each side, with initial weigthshifting through lower extremities, then added reach  PWR! Step for improved step initiation x 10 reps with cues for increased step length and foot clearance.  Cues provided for technique, intensity of movement patterns.     Review of HEP from last  visit, with pt needing minimal cues for correct technique: Seated Hamstring Stretch - 1 x daily - 7 x weekly - 1 sets - 3 reps - 30 sec hold Sit to Stand with Hands on Knees - 1 x daily - 7 x weekly - 1 sets - 10 reps (see notes above)   PT Education - 01/31/20 1849    Education Details Fall prevention education    Person(s) Educated Patient    Methods Explanation;Handout    Comprehension Verbalized understanding            PT Short Term Goals - 01/31/20 1553      PT SHORT TERM GOAL #1   Title Pt will be independent with HEP for improved strength, balance, transfers, and gait.  TARGET 02/02/2020    Time 5    Period Weeks    Status New      PT SHORT TERM GOAL #2   Title Pt will improve 5x sit<>stand to less than or equal to 15 seconds for improved functional strength and efficiency of transfers.    Baseline 19.34;15.09 sec 01/31/2020    Time 5    Period Weeks    Status Partially Met      PT SHORT TERM GOAL #3   Title Pt will improve TUG score to less than or equal to 13.5 sec for decreased fall risk.    Baseline 15.65; 14.4 sec, 13.25 sec (at best) 01/31/2020    Time 5    Period Weeks    Status Achieved      PT SHORT TERM GOAL #4   Title Pt will verbalize at least 3 means to decrease low back pain for improved overall functional mobility.    Time 5    Period Weeks      PT SHORT TERM GOAL #5   Title Pt will verbalize understanding of fall prevention in home environment.    Time 5    Period Weeks    Status Achieved             PT Long Term Goals - 01/04/20 2024      PT LONG TERM GOAL #1   Title Pt will be independent with progression of  HEP for improved strength, balance, transfers, and gait.  TARGET 03/01/2020    Time 9    Period Weeks    Status New      PT LONG TERM GOAL #2   Title Pt will improve 5x sit<>stand to less than  or equal to 13 seconds for improved transfer efficiency and safety.    Time 9    Period Weeks      PT LONG TERM GOAL #3   Title  MiniBESTest score to be assessed and goal to be written as appropriate.    Time 9    Period Weeks    Status New      PT LONG TERM GOAL #4   Title Pt will improve gait velocity to at least 2.62 ft/sec for improved gait efficiency and safety.    Baseline 2.42 ft/sec    Time 9    Period Weeks      PT LONG TERM GOAL #5   Title Pt will verbalize understanding of local Parkinson's disease resources.    Time 9    Period Weeks    Status New                 Plan - 01/31/20 1850    Clinical Impression Statement Began checking STGs this visit, with pt meeting STGs 3 and 5.  STG 2 partially met, with score of 5x sit<>stand improved to 15.09 seconds.  He demonstrates understanding of HEP, with minimal cueing for technique; worked again on standing PWR! Moves, but did not yet give for HEP, as PT didn't want to overwhelm pt with exercises/education this visit.  He is making good progress, but would continue to benefit from skilled PT to fruther address posture, balance, gait, transfers for overall improved mobility and decreased fall risk.    Personal Factors and Comorbidities Comorbidity 3+    Comorbidities PD, inflammatory polymyositis, Raynaud's, CTR LUE, R wrist surgery, back pain    Examination-Activity Limitations Locomotion Level;Transfers;Stand    Examination-Participation Restrictions Church;Occupation;Community Activity;Interpersonal Relationship    Stability/Clinical Decision Making Evolving/Moderate complexity    Rehab Potential Good    PT Frequency 2x / week    PT Duration Other (comment)   8 weeks plus eval visit = 9 weeks   PT Treatment/Interventions ADLs/Self Care Home Management;Gait training;Stair training;Functional mobility training;Therapeutic activities;Therapeutic exercise;Balance training;Neuromuscular re-education;DME Instruction;Patient/family education    PT Next Visit Plan Check remaining STGs; continue sit<>stand from varied surfaces, gait training with posture/arm  swing/cognitive dual tasking; try to add PWR! Moves stnading to HEP; balance    Consulted and Agree with Plan of Care Patient           Patient will benefit from skilled therapeutic intervention in order to improve the following deficits and impairments:  Abnormal gait, Difficulty walking, Decreased balance, Decreased mobility, Decreased strength, Postural dysfunction  Visit Diagnosis: Other abnormalities of gait and mobility  Unsteadiness on feet  Abnormal posture     Problem List Patient Active Problem List   Diagnosis Date Noted  . Gait abnormality 08/29/2019  . Midline low back pain without sciatica 08/29/2019  . Mild cognitive impairment 08/29/2019  . Excessive daytime sleepiness 01/24/2019  . Nocturia more than twice per night 01/24/2019  . Hypersomnia with sleep apnea 01/24/2019  . At risk for central sleep apnea 01/24/2019  . RLS (restless legs syndrome) 01/24/2019  . Idiopathic Parkinson's disease ([place]) 05/07/2015  . Polymyositis ([place]) 05/07/2015    Shamonique Battiste W. 01/31/2020, 6:55 PM  Frazier Butt., PT   [facility] [address] [place], [place], [address] Phone: [phone]   Fax:  [phone]  Name: Alexander Thomas MRN: [id number] Date of Birth: 1946-02-01

## 2020-02-05 ENCOUNTER — Ambulatory Visit: Payer: Medicare Other | Admitting: Physical Therapy

## 2020-02-05 DIAGNOSIS — C44329 Squamous cell carcinoma of skin of other parts of face: Secondary | ICD-10-CM | POA: Diagnosis not present

## 2020-02-07 ENCOUNTER — Ambulatory Visit: Payer: Medicare Other | Admitting: Physical Therapy

## 2020-02-08 ENCOUNTER — Ambulatory Visit: Payer: Medicare Other | Admitting: Physical Therapy

## 2020-02-12 ENCOUNTER — Other Ambulatory Visit: Payer: Self-pay

## 2020-02-12 ENCOUNTER — Ambulatory Visit: Payer: Medicare Other | Admitting: Physical Therapy

## 2020-02-12 ENCOUNTER — Encounter: Payer: Self-pay | Admitting: Physical Therapy

## 2020-02-12 DIAGNOSIS — R29818 Other symptoms and signs involving the nervous system: Secondary | ICD-10-CM | POA: Diagnosis not present

## 2020-02-12 DIAGNOSIS — M6281 Muscle weakness (generalized): Secondary | ICD-10-CM | POA: Diagnosis not present

## 2020-02-12 DIAGNOSIS — R293 Abnormal posture: Secondary | ICD-10-CM

## 2020-02-12 DIAGNOSIS — R2681 Unsteadiness on feet: Secondary | ICD-10-CM | POA: Diagnosis not present

## 2020-02-12 DIAGNOSIS — R2689 Other abnormalities of gait and mobility: Secondary | ICD-10-CM

## 2020-02-12 NOTE — Patient Instructions (Signed)
Provided handout for PWR! Moves standing 10-20 reps each

## 2020-02-12 NOTE — Therapy (Signed)
[facility] [address] [place], [place], [address] Phone: [phone]   Fax:  [phone]  Physical Therapy Treatment  Patient Details  Name: Alexander Thomas MRN: [id number] Date of Birth: [birth year] Referring Provider (PT): Marcial Pacas   Encounter Date: 02/12/2020   PT End of Session - 02/12/20 0946    Visit Number 9    Number of Visits 18    Date for PT Re-Evaluation 04/02/20    Authorization Type Medicare/BCBS    Progress Note Due on Visit 10    PT Start Time 0931    PT Stop Time 1016    PT Time Calculation (min) 45 min    Activity Tolerance Patient tolerated treatment well    Behavior During Therapy [facility] for tasks assessed/performed           Past Medical History:  Diagnosis Date  . Hypercholesteremia   . Migraine   . Polymyositis ([place])   . Raynaud disease   . Tremor of right hand     Past Surgical History:  Procedure Laterality Date  . CARPAL TUNNEL RELEASE Left   . INGUINAL HERNIA REPAIR Bilateral   . WRIST SURGERY Right     There were no vitals filed for this visit.   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    Pertinent History PD, inflammatory polymyositis, Raynaud's, CTR LUE, R wrist surgery, back pain; ?shingles L hip pain    Diagnostic tests MRI of low back unremarkable    Patient Stated Goals Pt's goals for therapy are to help pain in back, help tremors in R hand    Currently in Pain? Yes    Pain Score 7     Pain Location Back    Pain Orientation Left;Medial    Pain Descriptors / Indicators Sharp    Pain Type Chronic pain    Pain Onset More than a month ago    Pain Frequency Constant    Aggravating Factors  unsure    Pain Relieving Factors unsure  sometimes oral medicine helps                              [facility] Adult PT Treatment/Exercise - 02/12/20 0001      Transfers   Transfers Sit to Stand;Stand to Sit    Sit to Stand 5: Supervision;With upper extremity assist;Without upper extremity assist;From bed;From chair/3-in-1    Stand to Sit 5: Supervision      Ambulation/Gait   Ambulation/Gait Yes    Ambulation/Gait Assistance 5: Supervision    Ambulation Distance (Feet) --   in/out of clinic   Assistive device None    Gait Pattern Step-through pattern;Decreased arm swing - right;Decreased step length - right;Decreased step length - left;Shuffle;Lateral trunk lean to right;Trunk flexed    Ambulation Surface Level;Indoor      Exercises   Exercises Knee/Hip      Knee/Hip Exercises: Stretches   Active Hamstring Stretch Right;Left;2 reps;60 seconds    Active Hamstring Stretch Limitations foot propped on 4" step.  Cues for technique and to prevent knee flexion      Knee/Hip Exercises: Aerobic   Other Aerobic SciFit level 1.5 for 7 mins with BUE and BLE for strengthening, activity  tolerance, and ROM           Neuro Re-education:   Pt performs PWR! Moves instandingposition   PWR! Up for improved posturex 10 reps, cues for terminal knee extension  PWR! Rock for improved weighshiftingx 10 reps, each side, with initial weigthshifting through lower extremities, then added reach  PWR! Step for improved step initiationx 10 reps with cues for increased step length and foot clearance.  Cues provided fortechnique, intensity of movement patterns.     PT Education - 02/12/20 1258    Education Details PWR! moves standing    Person(s) Educated Patient    Methods Explanation;Demonstration;Verbal cues;Tactile cues;Handout    Comprehension Verbalized understanding;Need further instruction            PT Short Term Goals - 02/12/20 1258      PT SHORT TERM GOAL #1   Title Pt will be independent with HEP for improved strength, balance,  transfers, and gait.  TARGET 02/02/2020    Time 5    Period Weeks    Status Achieved      PT SHORT TERM GOAL #2   Title Pt will improve 5x sit<>stand to less than or equal to 15 seconds for improved functional strength and efficiency of transfers.    Baseline 19.34;15.09 sec 01/31/2020    Time 5    Period Weeks    Status Partially Met      PT SHORT TERM GOAL #3   Title Pt will improve TUG score to less than or equal to 13.5 sec for decreased fall risk.    Baseline 15.65; 14.4 sec, 13.25 sec (at best) 01/31/2020    Time 5    Period Weeks    Status Achieved      PT SHORT TERM GOAL #4   Title Pt will verbalize at least 3 means to decrease low back pain for improved overall functional mobility.    Time 5    Period Weeks    Status Not Met      PT SHORT TERM GOAL #5   Title Pt will verbalize understanding of fall prevention in home environment.    Time 5    Period Weeks    Status Achieved             PT Long Term Goals - 01/04/20 2024      PT LONG TERM GOAL #1   Title Pt will be independent with progression of  HEP for improved strength, balance, transfers, and gait.  TARGET 03/01/2020    Time 9    Period Weeks    Status New      PT LONG TERM GOAL #2   Title Pt will improve 5x sit<>stand to less than or equal to 13 seconds for improved transfer efficiency and safety.    Time 9    Period Weeks      PT LONG TERM GOAL #3   Title MiniBESTest score to be assessed and goal to be written as appropriate.    Time 9    Period Weeks    Status New      PT LONG TERM GOAL #4   Title Pt will improve gait velocity to at least 2.62 ft/sec for improved gait efficiency and safety.    Baseline 2.42 ft/sec    Time 9    Period Weeks      PT LONG TERM GOAL #5   Title Pt will verbalize understanding of local Parkinson's disease resources.    Time 9  Period Weeks    Status New                 Plan - 02/12/20 1259    Clinical Impression Statement Pt met STG#1 for HEP.   Pt did not meet STG#4 regarding strategies to reduce back pain.  Pt could benefit from specific additional exercises to address back pain and stretches.  Cont per poc.    Personal Factors and Comorbidities Comorbidity 3+    Comorbidities PD, inflammatory polymyositis, Raynaud's, CTR LUE, R wrist surgery, back pain    Examination-Activity Limitations Locomotion Level;Transfers;Stand    Examination-Participation Restrictions Church;Occupation;Community Activity;Interpersonal Relationship    Stability/Clinical Decision Making Evolving/Moderate complexity    Rehab Potential Good    PT Frequency 2x / week    PT Duration Other (comment)   8 weeks plus eval visit = 9 weeks   PT Treatment/Interventions ADLs/Self Care Home Management;Gait training;Stair training;Functional mobility training;Therapeutic activities;Therapeutic exercise;Balance training;Neuromuscular re-education;DME Instruction;Patient/family education    PT Next Visit Plan Assess bed mobility and possibly start PWR! moves supine.  Add additional exercises/stretches for low back and discuss ways to address back pain.  Continue sit<>stand from varied surfaces, gait training with posture/arm swing/cognitive dual tasking; Review PWR! Moves standing; balance    Consulted and Agree with Plan of Care Patient           Patient will benefit from skilled therapeutic intervention in order to improve the following deficits and impairments:  Abnormal gait, Difficulty walking, Decreased balance, Decreased mobility, Decreased strength, Postural dysfunction  Visit Diagnosis: Other abnormalities of gait and mobility  Unsteadiness on feet  Abnormal posture  Muscle weakness (generalized)  Other symptoms and signs involving the nervous system     Problem List Patient Active Problem List   Diagnosis Date Noted  . Gait abnormality 08/29/2019  . Midline low back pain without sciatica 08/29/2019  . Mild cognitive impairment 08/29/2019  .  Excessive daytime sleepiness 01/24/2019  . Nocturia more than twice per night 01/24/2019  . Hypersomnia with sleep apnea 01/24/2019  . At risk for central sleep apnea 01/24/2019  . RLS (restless legs syndrome) 01/24/2019  . Idiopathic Parkinson's disease ([place]) 05/07/2015  . Polymyositis ([place]) 05/07/2015   Narda Bonds, PTA [facility] 02/12/20 1:04 PM Phone: [phone] Fax: [facility] [address] [place], [place], [address] Phone: [phone]   Fax:  [phone]  Name: Alexander Thomas MRN: [id number] Date of Birth: 1945/11/27

## 2020-02-14 ENCOUNTER — Other Ambulatory Visit: Payer: Self-pay

## 2020-02-14 ENCOUNTER — Ambulatory Visit: Payer: Medicare Other | Admitting: Physical Therapy

## 2020-02-14 DIAGNOSIS — M6281 Muscle weakness (generalized): Secondary | ICD-10-CM | POA: Diagnosis not present

## 2020-02-14 DIAGNOSIS — R2681 Unsteadiness on feet: Secondary | ICD-10-CM | POA: Diagnosis not present

## 2020-02-14 DIAGNOSIS — R2689 Other abnormalities of gait and mobility: Secondary | ICD-10-CM | POA: Diagnosis not present

## 2020-02-14 DIAGNOSIS — R29818 Other symptoms and signs involving the nervous system: Secondary | ICD-10-CM | POA: Diagnosis not present

## 2020-02-14 DIAGNOSIS — R293 Abnormal posture: Secondary | ICD-10-CM | POA: Diagnosis not present

## 2020-02-15 NOTE — Therapy (Signed)
[facility] [address] [place], [place], [address] Phone: [phone]   Fax:  [phone]  Physical Therapy Treatment/10th Visit Progress Note  Patient Details  Name: Alexander Thomas MRN: [id number] Date of Birth: Jul 22, 1945 Referring Provider (PT): Marcial Pacas   Encounter Date: 02/14/2020   PT End of Session - 02/15/20 1521    Visit Number 10    Number of Visits 18    Date for PT Re-Evaluation 04/02/20    Authorization Type Medicare/BCBS    PT Start Time 1706    PT Stop Time 1748    PT Time Calculation (min) 42 min    Activity Tolerance Patient tolerated treatment well    Behavior During Therapy [facility] for tasks assessed/performed           Past Medical History:  Diagnosis Date  . Hypercholesteremia   . Migraine   . Polymyositis ([place])   . Raynaud disease   . Tremor of right hand     Past Surgical History:  Procedure Laterality Date  . CARPAL TUNNEL RELEASE Left   . INGUINAL HERNIA REPAIR Bilateral   . WRIST SURGERY Right     There were no vitals filed for this visit.   Subjective Assessment - 02/14/20 1709    Subjective Trying to do those exercises more regularly and I feel like I've got a little more energy.    Pertinent History PD, inflammatory polymyositis, Raynaud's, CTR LUE, R wrist surgery, back pain; ?shingles L hip pain    Diagnostic tests MRI of low back unremarkable    Patient Stated Goals Pt's goals for therapy are to help pain in back, help tremors in R hand    Currently in Pain? Yes    Pain Score 7     Pain Location Back   Hip   Pain Orientation Left;Lower    Pain Descriptors / Indicators Sharp    Pain Type Chronic pain    Pain Onset More than a month ago    Pain Frequency Constant    Aggravating Factors  unsure    Pain Relieving Factors sometimes exercise                             [facility] Adult PT Treatment/Exercise - 02/15/20 0001      Transfers    Transfers Sit to Stand;Stand to Sit    Sit to Stand 5: Supervision;With upper extremity assist;Without upper extremity assist;From bed;From chair/3-in-1    Sit to Stand Details Verbal cues for sequencing;Verbal cues for technique    Five time sit to stand comments  15.56    Stand to Sit 5: Supervision;With upper extremity assist;Without upper extremity assist;To chair/3-in-1    Stand to Sit Details (indicate cue type and reason) Verbal cues for technique;Verbal cues for sequencing    Number of Reps 2 sets;Other reps (comment)   5 reps     Ambulation/Gait   Ambulation/Gait Yes    Ambulation/Gait Assistance 5: Supervision    Ambulation/Gait Assistance Details Cues for posture, arm swing    Ambulation Distance (Feet) 115 Feet   x 3; 100 ft x 2   Assistive device None    Gait Pattern Step-through pattern;Decreased arm swing - right;Decreased step length - right;Decreased step length - left;Shuffle;Lateral trunk lean to right;Trunk flexed    Ambulation Surface Level;Indoor    Gait velocity 12.09 sec = 2.71 ft/sec      Exercises  Exercises Lumbar;Other Exercises    Other Exercises  Performed in hooklying position:  shoulder press into mat (PWR! Up supine position) x 8 reps, then worked on [employer]! Rock in supine, x 5 reps each direction, with PT provided manual/tactile cues for technique and to feel stretch through trunk.  Pt without c/o pain.      Lumbar Exercises: Stretches   Single Knee to Chest Stretch Right;Left;3 reps;10 seconds    Lower Trunk Rotation 10 seconds;5 reps    Pelvic Tilt 5 reps;5 seconds   With facilitation and cues for technique     Lumbar Exercises: Supine   Bridge 10 reps   Initial cues for technique          Performed PWR! Moves in Standing: PWR! Up x 10 reps  PWR! Rock x 10 reps each side PWR! Twist x 5 reps each side, cues for widened BOS and increased pivot for improved reach to clap.  Pt with continue difficulty. PWR! Step x 10 reps each side, initially  performs with coordinated UEs (with decreased foot clearance).  Transitioned to UE support at counter with side step and weigthshift with improved foot clearance with UE support, x 10 reps.         PT Short Term Goals - 02/12/20 1258      PT SHORT TERM GOAL #1   Title Pt will be independent with HEP for improved strength, balance, transfers, and gait.  TARGET 02/02/2020    Time 5    Period Weeks    Status Achieved      PT SHORT TERM GOAL #2   Title Pt will improve 5x sit<>stand to less than or equal to 15 seconds for improved functional strength and efficiency of transfers.    Baseline 19.34;15.09 sec 01/31/2020    Time 5    Period Weeks    Status Partially Met      PT SHORT TERM GOAL #3   Title Pt will improve TUG score to less than or equal to 13.5 sec for decreased fall risk.    Baseline 15.65; 14.4 sec, 13.25 sec (at best) 01/31/2020    Time 5    Period Weeks    Status Achieved      PT SHORT TERM GOAL #4   Title Pt will verbalize at least 3 means to decrease low back pain for improved overall functional mobility.    Time 5    Period Weeks    Status Not Met      PT SHORT TERM GOAL #5   Title Pt will verbalize understanding of fall prevention in home environment.    Time 5    Period Weeks    Status Achieved             PT Long Term Goals - 02/15/20 1527      PT LONG TERM GOAL #1   Title Pt will be independent with progression of  HEP for improved strength, balance, transfers, and gait.  TARGET 03/01/2020    Time 9    Period Weeks    Status New      PT LONG TERM GOAL #2   Title Pt will improve 5x sit<>stand to less than or equal to 13 seconds for improved transfer efficiency and safety.    Time 9    Period Weeks      PT LONG TERM GOAL #3   Title MiniBESTest score to improved to at least 22/28 for decreased fall risk.    Baseline  17/28    Time 9    Period Weeks    Status New      PT LONG TERM GOAL #4   Title Pt will improve gait velocity to at least  2.62 ft/sec for improved gait efficiency and safety.    Baseline 2.42 ft/sec    Time 9    Period Weeks      PT LONG TERM GOAL #5   Title Pt will verbalize understanding of local Parkinson's disease resources.    Time 9    Period Weeks    Status New                 Plan - 02/15/20 1522    Clinical Impression Statement Addressed standing PWR! Moves and supine exercises for low back flexibility and PWR! Moves in supine for posture and trunk flexibility.  After exercises, pt reprots decreased L hip pain (6/10 compared to 7/10).  10th VISIT PROGRESS NOTE:  covering visits 01/03/2020-02/14/2020.  Subjective reprots:  pt reports he feels better and has more energy with doing exercises.  Objective measures:  5x sit<>stand 15.56 sec (improved from eval 19.34), gait velocity 2.71 ft/sec (improved from 2.42 ft/sec at eval).  Pt has recently met 3 of 5 STGs and is on target for LTGs.  He will continue to beneift from skilled PT to further address strength, posture, balance and gait for improved overall functional mobility.    Personal Factors and Comorbidities Comorbidity 3+    Comorbidities PD, inflammatory polymyositis, Raynaud's, CTR LUE, R wrist surgery, back pain    Examination-Activity Limitations Locomotion Level;Transfers;Stand    Examination-Participation Restrictions Church;Occupation;Community Activity;Interpersonal Relationship    Stability/Clinical Decision Making Evolving/Moderate complexity    Rehab Potential Good    PT Frequency 2x / week    PT Duration Other (comment)   8 weeks plus eval visit = 9 weeks   PT Treatment/Interventions ADLs/Self Care Home Management;Gait training;Stair training;Functional mobility training;Therapeutic activities;Therapeutic exercise;Balance training;Neuromuscular re-education;DME Instruction;Patient/family education    PT Next Visit Plan Assess bed mobility and work on [employer]! moves supine.   Work again on additional exercises/stretches for low back and  discuss ways to address back pain.  Continue sit<>stand from varied surfaces, gait training with posture/arm swing/cognitive dual tasking.  Continue towards LTGs    Consulted and Agree with Plan of Care Patient           Patient will benefit from skilled therapeutic intervention in order to improve the following deficits and impairments:  Abnormal gait, Difficulty walking, Decreased balance, Decreased mobility, Decreased strength, Postural dysfunction  Visit Diagnosis: Other abnormalities of gait and mobility  Unsteadiness on feet  Abnormal posture     Problem List Patient Active Problem List   Diagnosis Date Noted  . Gait abnormality 08/29/2019  . Midline low back pain without sciatica 08/29/2019  . Mild cognitive impairment 08/29/2019  . Excessive daytime sleepiness 01/24/2019  . Nocturia more than twice per night 01/24/2019  . Hypersomnia with sleep apnea 01/24/2019  . At risk for central sleep apnea 01/24/2019  . RLS (restless legs syndrome) 01/24/2019  . Idiopathic Parkinson's disease ([place]) 05/07/2015  . Polymyositis ([place]) 05/07/2015    Laquisha Northcraft W. 02/15/2020, 3:28 PM Frazier Butt., PT  [facility] [address] [place], [place], [address] Phone: [phone]   Fax:  [phone]  Name: Alexander Thomas MRN: [id number] Date of Birth: Dec 28, 1945

## 2020-02-19 ENCOUNTER — Other Ambulatory Visit: Payer: Self-pay

## 2020-02-19 ENCOUNTER — Ambulatory Visit: Payer: Medicare Other | Attending: Neurology | Admitting: Physical Therapy

## 2020-02-19 DIAGNOSIS — R2689 Other abnormalities of gait and mobility: Secondary | ICD-10-CM | POA: Diagnosis not present

## 2020-02-19 DIAGNOSIS — M6281 Muscle weakness (generalized): Secondary | ICD-10-CM | POA: Diagnosis not present

## 2020-02-19 DIAGNOSIS — R2681 Unsteadiness on feet: Secondary | ICD-10-CM | POA: Insufficient documentation

## 2020-02-19 DIAGNOSIS — R293 Abnormal posture: Secondary | ICD-10-CM | POA: Diagnosis not present

## 2020-02-19 DIAGNOSIS — R29818 Other symptoms and signs involving the nervous system: Secondary | ICD-10-CM | POA: Insufficient documentation

## 2020-02-19 NOTE — Therapy (Signed)
[facility] [address] [place], [place], [address] Phone: [phone]   Fax:  [phone]  Physical Therapy Treatment  Patient Details  Name: Alexander Thomas MRN: [id number] Date of Birth: 12/03/1945 Referring Provider (PT): Marcial Pacas   Encounter Date: 02/19/2020   PT End of Session - 02/19/20 1555    Visit Number 11    Number of Visits 18    Date for PT Re-Evaluation 04/02/20    Authorization Type Medicare/BCBS    PT Start Time 7681    PT Stop Time 1445    PT Time Calculation (min) 40 min    Activity Tolerance Patient tolerated treatment well   Pt reports less pain, 4-5/10 at end of session   Behavior During Therapy [facility] for tasks assessed/performed           Past Medical History:  Diagnosis Date  . Hypercholesteremia   . Migraine   . Polymyositis ([place])   . Raynaud disease   . Tremor of right hand     Past Surgical History:  Procedure Laterality Date  . CARPAL TUNNEL RELEASE Left   . INGUINAL HERNIA REPAIR Bilateral   . WRIST SURGERY Right     There were no vitals filed for this visit.   Subjective Assessment - 02/19/20 1409    Subjective Feel like we are on the right track with the exercises.    Pertinent History PD, inflammatory polymyositis, Raynaud's, CTR LUE, R wrist surgery, back pain; ?shingles L hip pain    Diagnostic tests MRI of low back unremarkable    Patient Stated Goals Pt's goals for therapy are to help pain in back, help tremors in R hand    Currently in Pain? Yes    Pain Score 6     Pain Location Back   Hip area   Pain Orientation Right;Left;Lower    Pain Descriptors / Indicators Sharp    Pain Type Chronic pain    Pain Onset More than a month ago    Pain Frequency Constant    Aggravating Factors  unsure    Pain Relieving Factors stretches are helping                          (Transfers, as NMR this visit, with addition of PWR! Moves upon standing)    [facility] Adult PT Treatment/Exercise - 02/19/20 0001      Transfers   Transfers Sit to Stand;Stand to Sit    Sit to Stand 5: Supervision;6: Modified independent (Device/Increase time);Without upper extremity assist;From bed    Stand to Sit 5: Supervision;6: Modified independent (Device/Increase time);Without upper extremity assist;To bed    Number of Reps Other reps (comment);Other sets (comment)    Comments Performed sit>stand, PWR! UP, stand>sit x 5 reps; then performed sit>stand, PWR! Rock x 5 reps, stand>sit x 3 total reps.      Ambulation/Gait   Ambulation/Gait Yes    Ambulation/Gait Assistance 5: Supervision    Ambulation/Gait Assistance Details Pt overall maintains upright posture, uses mirror as visual cue to self-correct posture    Ambulation Distance (Feet) 400 Feet   then 200   Assistive device None    Gait Pattern Step-through pattern;Decreased arm swing - right;Decreased step length - right;Decreased step length - left;Shuffle;Lateral trunk lean to right;Trunk flexed    Ambulation Surface Level;Indoor      Exercises   Exercises Lumbar;Other Exercises    Other Exercises  Performed in hooklying  position:  shoulder press into mat (PWR! Up supine position) x 10 reps, then worked on [employer]! Rock in supine, x 5 reps each direction, with PT provided manual/tactile cues for technique and to feel stretch through trunk.  Pt without c/o pain.      Lumbar Exercises: Stretches   Single Knee to Chest Stretch Right;Left;3 reps;10 seconds    Lower Trunk Rotation 10 seconds;5 reps    Pelvic Tilt 5 reps;5 seconds      Lumbar Exercises: Supine   Bridge 10 reps   Initial cues for technique          Bed mobility, simulating pt's higher bed:  Pt crawls into bed and turns to get in supine, which is how he stays through the night (actually looks like a good technique, given higher bed height). To get out of bed, pt sits up to long sit, and then brings legs off mat.  Discussed this may not be good on  his back and suggested trying sidelying>sit edge of bed, but pt reports he has tried this at home and almost fallen out of bed.  Did not try any further today.       PT Education - 02/19/20 1554    Education Details Updates to HEP to address low back pain/posture    Person(s) Educated Patient    Methods Explanation;Demonstration;Handout    Comprehension Verbalized understanding;Returned demonstration            PT Short Term Goals - 02/12/20 1258      PT SHORT TERM GOAL #1   Title Pt will be independent with HEP for improved strength, balance, transfers, and gait.  TARGET 02/02/2020    Time 5    Period Weeks    Status Achieved      PT SHORT TERM GOAL #2   Title Pt will improve 5x sit<>stand to less than or equal to 15 seconds for improved functional strength and efficiency of transfers.    Baseline 19.34;15.09 sec 01/31/2020    Time 5    Period Weeks    Status Partially Met      PT SHORT TERM GOAL #3   Title Pt will improve TUG score to less than or equal to 13.5 sec for decreased fall risk.    Baseline 15.65; 14.4 sec, 13.25 sec (at best) 01/31/2020    Time 5    Period Weeks    Status Achieved      PT SHORT TERM GOAL #4   Title Pt will verbalize at least 3 means to decrease low back pain for improved overall functional mobility.    Time 5    Period Weeks    Status Not Met      PT SHORT TERM GOAL #5   Title Pt will verbalize understanding of fall prevention in home environment.    Time 5    Period Weeks    Status Achieved             PT Long Term Goals - 02/15/20 1527      PT LONG TERM GOAL #1   Title Pt will be independent with progression of  HEP for improved strength, balance, transfers, and gait.  TARGET 03/01/2020    Time 9    Period Weeks    Status New      PT LONG TERM GOAL #2   Title Pt will improve 5x sit<>stand to less than or equal to 13 seconds for improved transfer efficiency and safety.  Time 9    Period Weeks      PT LONG TERM GOAL  #3   Title MiniBESTest score to improved to at least 22/28 for decreased fall risk.    Baseline 17/28    Time 9    Period Weeks    Status New      PT LONG TERM GOAL #4   Title Pt will improve gait velocity to at least 2.62 ft/sec for improved gait efficiency and safety.    Baseline 2.42 ft/sec    Time 9    Period Weeks      PT LONG TERM GOAL #5   Title Pt will verbalize understanding of local Parkinson's disease resources.    Time 9    Period Weeks    Status New                 Plan - 02/19/20 1555    Clinical Impression Statement Pt reports continued slightly less pain (6/10, compared to 7/10) upon beginning of therapy session today.  He feels the exercises we did last visit may have helped.  Continued work on supine low back and PWR! Up/PWR! Rock exercises, with pt performing well, and reproting 4-5/10 pain after exercises and throughout remainder of session.  He appears to conitnue to have improved postural awareness and increased time of more upright posture during gait today.  HE will continue to benefit from skilled PT to further address posture, strength, balance, and gait for improved mobility.    Personal Factors and Comorbidities Comorbidity 3+    Comorbidities PD, inflammatory polymyositis, Raynaud's, CTR LUE, R wrist surgery, back pain    Examination-Activity Limitations Locomotion Level;Transfers;Stand    Examination-Participation Restrictions Church;Occupation;Community Activity;Interpersonal Relationship    Stability/Clinical Decision Making Evolving/Moderate complexity    Rehab Potential Good    PT Frequency 2x / week    PT Duration Other (comment)   8 weeks plus eval visit = 9 weeks   PT Treatment/Interventions ADLs/Self Care Home Management;Gait training;Stair training;Functional mobility training;Therapeutic activities;Therapeutic exercise;Balance training;Neuromuscular re-education;DME Instruction;Patient/family education    PT Next Visit Plan Review supine  exercises added to HEP; work on sit<>stand, posture exercises, gait and balance; cognitive dual tasking with gait, trying to maintain posture and step length/arm swing    Consulted and Agree with Plan of Care Patient           Patient will benefit from skilled therapeutic intervention in order to improve the following deficits and impairments:  Abnormal gait, Difficulty walking, Decreased balance, Decreased mobility, Decreased strength, Postural dysfunction  Visit Diagnosis: Abnormal posture  Muscle weakness (generalized)  Other abnormalities of gait and mobility     Problem List Patient Active Problem List   Diagnosis Date Noted  . Gait abnormality 08/29/2019  . Midline low back pain without sciatica 08/29/2019  . Mild cognitive impairment 08/29/2019  . Excessive daytime sleepiness 01/24/2019  . Nocturia more than twice per night 01/24/2019  . Hypersomnia with sleep apnea 01/24/2019  . At risk for central sleep apnea 01/24/2019  . RLS (restless legs syndrome) 01/24/2019  . Idiopathic Parkinson's disease ([place]) 05/07/2015  . Polymyositis ([place]) 05/07/2015    Decker Cogdell W. 02/19/2020, 3:59 PM  Frazier Butt., PT   [facility] [address] [place], [place], [address] Phone: [phone]   Fax:  [phone]  Name: KERWIN AUGUSTUS MRN: [id number] Date of Birth: July 01, 1945

## 2020-02-19 NOTE — Patient Instructions (Addendum)
Access Code: [email or web address] URL: [email or web address] Date: 02/19/2020 Prepared by: Mady Haagensen  Exercises Seated Hamstring Stretch - 1 x daily - 7 x weekly - 1 sets - 3 reps - 30 sec hold Sit to Stand with Hands on Knees - 1 x daily - 7 x weekly - 1 sets - 10 reps  Additions to HEP: Hooklying Single Knee to Chest Stretch - 1 x daily - 7 x weekly - 1 sets - 3-5 reps - 15 sec hold Supine Lower Trunk Rotation - 1 x daily - 7 x weekly - 1 sets - 5 reps - 15 sec hold Supine PWR! Up (through shoulders x 10 reps)-knees bent Supine PWR! Rock (knees bent, reach up and across, x 5-10 reps each side)

## 2020-02-21 ENCOUNTER — Other Ambulatory Visit: Payer: Self-pay

## 2020-02-21 ENCOUNTER — Ambulatory Visit: Payer: Medicare Other | Admitting: Physical Therapy

## 2020-02-21 ENCOUNTER — Encounter: Payer: Self-pay | Admitting: Physical Therapy

## 2020-02-21 DIAGNOSIS — R2681 Unsteadiness on feet: Secondary | ICD-10-CM | POA: Diagnosis not present

## 2020-02-21 DIAGNOSIS — M6281 Muscle weakness (generalized): Secondary | ICD-10-CM | POA: Diagnosis not present

## 2020-02-21 DIAGNOSIS — R293 Abnormal posture: Secondary | ICD-10-CM | POA: Diagnosis not present

## 2020-02-21 DIAGNOSIS — R2689 Other abnormalities of gait and mobility: Secondary | ICD-10-CM | POA: Diagnosis not present

## 2020-02-21 DIAGNOSIS — R29818 Other symptoms and signs involving the nervous system: Secondary | ICD-10-CM | POA: Diagnosis not present

## 2020-02-21 NOTE — Therapy (Signed)
[facility] [address] [place], [place], [address] Phone: [phone]   Fax:  [phone]  Physical Therapy Treatment  Patient Details  Name: Alexander Thomas MRN: [id number] Date of Birth: [birth year] Referring Provider (PT): Marcial Pacas   Encounter Date: 02/21/2020   PT End of Session - 02/21/20 1631    Visit Number 12    Number of Visits 18    Date for PT Re-Evaluation 04/02/20    Authorization Type Medicare/BCBS    PT Start Time 1534    PT Stop Time 1614    PT Time Calculation (min) 40 min    Equipment Utilized During Treatment Gait belt    Activity Tolerance Patient tolerated treatment well    Behavior During Therapy [facility] for tasks assessed/performed           Past Medical History:  Diagnosis Date  . Hypercholesteremia   . Migraine   . Polymyositis ([place])   . Raynaud disease   . Tremor of right hand     Past Surgical History:  Procedure Laterality Date  . CARPAL TUNNEL RELEASE Left   . INGUINAL HERNIA REPAIR Bilateral   . WRIST SURGERY Right     There were no vitals filed for this visit.   Subjective Assessment - 02/21/20 1537    Subjective Got his stitches out yesterday. Exercises are going well at home.    Pertinent History PD, inflammatory polymyositis, Raynaud's, CTR LUE, R wrist surgery, back pain; ?shingles L hip pain    Diagnostic tests MRI of low back unremarkable    Patient Stated Goals Pt's goals for therapy are to help pain in back, help tremors in R hand    Currently in Pain? Yes    Pain Score 5     Pain Location Hip   L hip area   Pain Orientation Left    Pain Descriptors / Indicators Sharp    Pain Type Chronic pain    Pain Onset More than a month ago    Aggravating Factors  "doesn't know - it acts like it has a mind of its own"    Pain Relieving Factors streches exercises                             [facility] Adult PT Treatment/Exercise - 02/21/20 0001       Transfers   Sit to Stand 5: Supervision;Without upper extremity assist    Stand to Sit 5: Supervision;Without upper extremity assist    Number of Reps Other reps (comment)   5 reps from mat table, tall posture in standing   Comments performed sit > stand then PWR Up! and stand > sit, x6 reps, initial demo cues for technique      Ambulation/Gait   Ambulation/Gait Yes    Ambulation/Gait Assistance 5: Supervision    Ambulation/Gait Assistance Details initial cues to stop and reset for tall posture. Cues for incr step length B.  PT holding longer boomwhacker behind pt's trunk as tactile cue for pt to touch with arm each time for incr B arm swing during gait, at times tried to remove boomwhacker to see if pt would carryover incr arm swing, pt would continue with arm swing but with decr amplitude. Pt reporting "this movement feels awkward" - discussed that pt will need to think of performing bigger movements in order to have more normal movement patterns and that this awkward feeling of too big  of movement is good, pt verbalizing understanding and need to practice this at home.     Ambulation Distance (Feet) 345 Feet   plus 115'   Assistive device None    Gait Pattern Step-through pattern;Decreased arm swing - right;Decreased step length - right;Decreased step length - left;Shuffle;Lateral trunk lean to right;Trunk flexed    Ambulation Surface Level;Indoor      Exercises   Exercises Other Exercises    Other Exercises  from most recent HEP addition: Reviewed single knee to chest stretch B 2 x 15 seconds B and lower trunk rotations x6 reps B with 10 second holds, pt reports feeling better after performing exercises and has not been performing consistently at home. Discussed performing in the morning before getting out of bed.           NMR:    Pt performs PWR! Moves in supine position - reviewed from previous HEP    PWR! Up for improved posture - in hooklying position, verbal and demo cues for  proper technique 2 x 10 reps   PWR! Rock for improved weighshifting - in hooklying - x6 reps B cues to reach up and across towards headboard for weight, needing initial manual and tactile cues for proper technique and intensity of movement.           PT Education - 02/21/20 1631    Education Details reviewed previous HEP    Person(s) Educated Patient    Methods Explanation;Demonstration;Tactile cues;Verbal cues    Comprehension Verbalized understanding;Returned demonstration            PT Short Term Goals - 02/12/20 1258      PT SHORT TERM GOAL #1   Title Pt will be independent with HEP for improved strength, balance, transfers, and gait.  TARGET 02/02/2020    Time 5    Period Weeks    Status Achieved      PT SHORT TERM GOAL #2   Title Pt will improve 5x sit<>stand to less than or equal to 15 seconds for improved functional strength and efficiency of transfers.    Baseline 19.34;15.09 sec 01/31/2020    Time 5    Period Weeks    Status Partially Met      PT SHORT TERM GOAL #3   Title Pt will improve TUG score to less than or equal to 13.5 sec for decreased fall risk.    Baseline 15.65; 14.4 sec, 13.25 sec (at best) 01/31/2020    Time 5    Period Weeks    Status Achieved      PT SHORT TERM GOAL #4   Title Pt will verbalize at least 3 means to decrease low back pain for improved overall functional mobility.    Time 5    Period Weeks    Status Not Met      PT SHORT TERM GOAL #5   Title Pt will verbalize understanding of fall prevention in home environment.    Time 5    Period Weeks    Status Achieved             PT Long Term Goals - 02/21/20 1643      PT LONG TERM GOAL #1   Title Pt will be independent with progression of  HEP for improved strength, balance, transfers, and gait.  TARGET 03/08/2020    Time 9    Period Weeks    Status New      PT LONG TERM GOAL #2  Title Pt will improve 5x sit<>stand to less than or equal to 13 seconds for improved  transfer efficiency and safety.    Time 9    Period Weeks      PT LONG TERM GOAL #3   Title MiniBESTest score to improved to at least 22/28 for decreased fall risk.    Baseline 17/28    Time 9    Period Weeks    Status New      PT LONG TERM GOAL #4   Title Pt will improve gait velocity to at least 2.62 ft/sec for improved gait efficiency and safety.    Baseline 2.42 ft/sec    Time 9    Period Weeks      PT LONG TERM GOAL #5   Title Pt will verbalize understanding of local Parkinson's disease resources.    Time 9    Period Weeks    Status New                 Plan - 02/21/20 1643    Clinical Impression Statement Began sesion by reviewing pt's most recent additions to HEP, as pt reports that he has not been performing consistently at home. Pt reporting relief and feeling improvements with posture after performing. Continued gait training with focus on tall posture and arm swing. Pt responded well to tactile cue of having to tap a boomwhacker posteriorly with each arm for incr arm swing and incr amplitude of movement. Revised LTG date as pt missed a week of therapy. Will continue to progress towards LTGs.    Personal Factors and Comorbidities Comorbidity 3+    Comorbidities PD, inflammatory polymyositis, Raynaud's, CTR LUE, R wrist surgery, back pain    Examination-Activity Limitations Locomotion Level;Transfers;Stand    Examination-Participation Restrictions Church;Occupation;Community Activity;Interpersonal Relationship    PT Frequency 2x / week    PT Duration 8 weeks   8 weeks plus eval visit = 9 weeks   PT Treatment/Interventions ADLs/Self Care Home Management;Gait training;Stair training;Functional mobility training;Therapeutic activities;Therapeutic exercise;Balance training;Neuromuscular re-education;DME Instruction;Patient/family education    PT Next Visit Plan how is HEP going? work on sit<>stand, posture exercises, gait and balance; cognitive dual tasking with gait,  trying to maintain posture and step length/arm swing           Patient will benefit from skilled therapeutic intervention in order to improve the following deficits and impairments:  Abnormal gait, Difficulty walking, Decreased balance, Decreased mobility, Decreased strength, Postural dysfunction  Visit Diagnosis: Abnormal posture  Other abnormalities of gait and mobility  Muscle weakness (generalized)  Unsteadiness on feet     Problem List Patient Active Problem List   Diagnosis Date Noted  . Gait abnormality 08/29/2019  . Midline low back pain without sciatica 08/29/2019  . Mild cognitive impairment 08/29/2019  . Excessive daytime sleepiness 01/24/2019  . Nocturia more than twice per night 01/24/2019  . Hypersomnia with sleep apnea 01/24/2019  . At risk for central sleep apnea 01/24/2019  . RLS (restless legs syndrome) 01/24/2019  . Idiopathic Parkinson's disease ([place]) 05/07/2015  . Polymyositis ([place]) 05/07/2015    Arliss Journey, PT, DPT  02/21/2020, 4:47 PM  [facility] [address] [place], [place], [address] Phone: [phone]   Fax:  [phone]  Name: Alexander Thomas MRN: [id number] Date of Birth: 1945/06/14

## 2020-02-26 ENCOUNTER — Other Ambulatory Visit: Payer: Self-pay

## 2020-02-26 ENCOUNTER — Ambulatory Visit: Payer: Medicare Other | Admitting: Physical Therapy

## 2020-02-26 DIAGNOSIS — M6281 Muscle weakness (generalized): Secondary | ICD-10-CM | POA: Diagnosis not present

## 2020-02-26 DIAGNOSIS — R293 Abnormal posture: Secondary | ICD-10-CM | POA: Diagnosis not present

## 2020-02-26 DIAGNOSIS — R2689 Other abnormalities of gait and mobility: Secondary | ICD-10-CM | POA: Diagnosis not present

## 2020-02-26 DIAGNOSIS — R2681 Unsteadiness on feet: Secondary | ICD-10-CM

## 2020-02-26 DIAGNOSIS — R29818 Other symptoms and signs involving the nervous system: Secondary | ICD-10-CM | POA: Diagnosis not present

## 2020-02-27 NOTE — Therapy (Signed)
[facility] [address] [place], [place], [address] Phone: [phone]   Fax:  [phone]  Physical Therapy Treatment  Patient Details  Name: Alexander Thomas MRN: [id number] Date of Birth: February 04, 1946 Referring Provider (PT): Marcial Pacas   Encounter Date: 02/26/2020   PT End of Session - 02/27/20 1601    Visit Number 13    Number of Visits 18    Date for PT Re-Evaluation 04/02/20    Authorization Type Medicare/BCBS    PT Start Time 1404    PT Stop Time 1446    PT Time Calculation (min) 42 min    Equipment Utilized During Treatment Gait belt    Activity Tolerance Patient tolerated treatment well    Behavior During Therapy WFL for tasks assessed/performed           Past Medical History:  Diagnosis Date   Hypercholesteremia    Migraine    Polymyositis ([place])    Raynaud disease    Tremor of right hand     Past Surgical History:  Procedure Laterality Date   CARPAL TUNNEL RELEASE Left    INGUINAL HERNIA REPAIR Bilateral    WRIST SURGERY Right     There were no vitals filed for this visit.   Subjective Assessment - 02/26/20 1408    Subjective Been trying some of the exercises at home, and I think the left side pain is a little better.    Pertinent History PD, inflammatory polymyositis, Raynaud's, CTR LUE, R wrist surgery, back pain; ?shingles L hip pain    Diagnostic tests MRI of low back unremarkable    Patient Stated Goals Pt's goals for therapy are to help pain in back, help tremors in R hand    Currently in Pain? Yes    Pain Score 6    5-6   Pain Location Hip    Pain Orientation Left    Pain Descriptors / Indicators Sharp    Pain Type Chronic pain    Pain Onset More than a month ago    Pain Frequency Constant    Aggravating Factors  unsure    Pain Relieving Factors stretching, exercises                             [facility] Adult PT Treatment/Exercise - 02/26/20 1405        Transfers   Sit to Stand 5: Supervision;Without upper extremity assist    Stand to Sit 5: Supervision;Without upper extremity assist    Number of Reps 10 reps   from chair   Comments PWR! Up in sitting>sit to stand>PWR! Up in stand, then stand>sit, performed with visual, verbal cues x 6 reps.      Ambulation/Gait   Ambulation/Gait Yes    Ambulation/Gait Assistance 5: Supervision    Ambulation/Gait Assistance Details Cues for upright posture, arm swing and step length.  With cognitive dual task activity, pt's gait appears to slow and decreased arm swing, step length noted, despite cues.    Ambulation Distance (Feet) 600 Feet    Assistive device None    Gait Pattern Step-through pattern;Decreased arm swing - right;Decreased step length - right;Decreased step length - left;Shuffle;Lateral trunk lean to right;Trunk flexed    Ambulation Surface Level;Indoor    Gait Comments Cognitive dual task activity with naming cities, A-P, with pt taking extra time and slowed pace with gait.      Posture/Postural Control   Posture/Postural  Control Postural limitations    Postural Limitations Rounded Shoulders;Forward head;Flexed trunk;Weight shift right    Posture Comments Seated scapular retraction x 5 reps, 2 sets, with cues for posture and techinque, neck retraction 2 sets x 5 reps with ball behind head for tactile cues; sit as "tall the wall", 5 reps x 30 seconds          Additional gait, short distances between activities, 20-40 ft x 6 reps, cues for posture, step length     Balance Exercises - 02/26/20 1410      Balance Exercises: Standing   SLS Eyes open;Solid surface;Intermittent upper extremity support;3 reps   1-3 sec each leg   SLS with Vectors Solid surface;Intermittent upper extremity assist;Limitations    SLS with Vectors Limitations alternating step taps to 6" step x 10, to 12" step x 10    Standing, One Foot on a Step Eyes open;6 inch;Limitations    Standing, One Foot on a Step  Limitations alternating UE lifts x 5 reps, head turns x 5 reps               PT Short Term Goals - 02/12/20 1258      PT SHORT TERM GOAL #1   Title Pt will be independent with HEP for improved strength, balance, transfers, and gait.  TARGET 02/02/2020    Time 5    Period Weeks    Status Achieved      PT SHORT TERM GOAL #2   Title Pt will improve 5x sit<>stand to less than or equal to 15 seconds for improved functional strength and efficiency of transfers.    Baseline 19.34;15.09 sec 01/31/2020    Time 5    Period Weeks    Status Partially Met      PT SHORT TERM GOAL #3   Title Pt will improve TUG score to less than or equal to 13.5 sec for decreased fall risk.    Baseline 15.65; 14.4 sec, 13.25 sec (at best) 01/31/2020    Time 5    Period Weeks    Status Achieved      PT SHORT TERM GOAL #4   Title Pt will verbalize at least 3 means to decrease low back pain for improved overall functional mobility.    Time 5    Period Weeks    Status Not Met      PT SHORT TERM GOAL #5   Title Pt will verbalize understanding of fall prevention in home environment.    Time 5    Period Weeks    Status Achieved             PT Long Term Goals - 02/21/20 1643      PT LONG TERM GOAL #1   Title Pt will be independent with progression of  HEP for improved strength, balance, transfers, and gait.  TARGET 03/08/2020    Time 9    Period Weeks    Status New      PT LONG TERM GOAL #2   Title Pt will improve 5x sit<>stand to less than or equal to 13 seconds for improved transfer efficiency and safety.    Time 9    Period Weeks      PT LONG TERM GOAL #3   Title MiniBESTest score to improved to at least 22/28 for decreased fall risk.    Baseline 17/28    Time 9    Period Weeks    Status New  PT LONG TERM GOAL #4   Title Pt will improve gait velocity to at least 2.62 ft/sec for improved gait efficiency and safety.    Baseline 2.42 ft/sec    Time 9    Period Weeks      PT  LONG TERM GOAL #5   Title Pt will verbalize understanding of local Parkinson's disease resources.    Time 9    Period Weeks    Status New                 Plan - 02/27/20 1602    Clinical Impression Statement Focus of today's session on postural exercises, gait and single limb standing.  With dual tasking with gait, he slows down gait pattern with decreased step length and foot clearance.  He does respond well to postural cues and exercises.  Will need to begin looking at LTGs and discuss POC (d/c versus renew)    Personal Factors and Comorbidities Comorbidity 3+    Comorbidities PD, inflammatory polymyositis, Raynaud's, CTR LUE, R wrist surgery, back pain    Examination-Activity Limitations Locomotion Level;Transfers;Stand    Examination-Participation Restrictions Church;Occupation;Community Activity;Interpersonal Relationship    PT Frequency 2x / week    PT Duration 8 weeks   8 weeks plus eval visit = 9 weeks   PT Treatment/Interventions ADLs/Self Care Home Management;Gait training;Stair training;Functional mobility training;Therapeutic activities;Therapeutic exercise;Balance training;Neuromuscular re-education;DME Instruction;Patient/family education    PT Next Visit Plan Begin checking LTGs and discuss POC (discharge next week vs. renew);  work on sit<>stand, posture exercises (add to HEP as able), gait and balance; cognitive dual tasking with gait, trying to maintain posture and step length/arm swing           Patient will benefit from skilled therapeutic intervention in order to improve the following deficits and impairments:  Abnormal gait, Difficulty walking, Decreased balance, Decreased mobility, Decreased strength, Postural dysfunction  Visit Diagnosis: Abnormal posture  Other abnormalities of gait and mobility  Muscle weakness (generalized)  Unsteadiness on feet     Problem List Patient Active Problem List   Diagnosis Date Noted   Gait abnormality 08/29/2019     Midline low back pain without sciatica 08/29/2019   Mild cognitive impairment 08/29/2019   Excessive daytime sleepiness 01/24/2019   Nocturia more than twice per night 01/24/2019   Hypersomnia with sleep apnea 01/24/2019   At risk for central sleep apnea 01/24/2019   RLS (restless legs syndrome) 01/24/2019   Idiopathic Parkinson's disease ([place]) 05/07/2015   Polymyositis ([place]) 05/07/2015    Theresea Trautmann W. 02/27/2020, 4:06 PM Frazier Butt., PT  [facility] [address] [place], [place], [address] Phone: [phone]   Fax:  [phone]  Name: LANGFORD CARIAS MRN: [id number] Date of Birth: 01/03/1946

## 2020-02-28 ENCOUNTER — Ambulatory Visit: Payer: Medicare Other | Admitting: Physical Therapy

## 2020-02-28 ENCOUNTER — Other Ambulatory Visit: Payer: Self-pay

## 2020-02-28 DIAGNOSIS — R293 Abnormal posture: Secondary | ICD-10-CM | POA: Diagnosis not present

## 2020-02-28 DIAGNOSIS — R2681 Unsteadiness on feet: Secondary | ICD-10-CM | POA: Diagnosis not present

## 2020-02-28 DIAGNOSIS — R2689 Other abnormalities of gait and mobility: Secondary | ICD-10-CM

## 2020-02-28 DIAGNOSIS — M6281 Muscle weakness (generalized): Secondary | ICD-10-CM | POA: Diagnosis not present

## 2020-02-28 DIAGNOSIS — R29818 Other symptoms and signs involving the nervous system: Secondary | ICD-10-CM | POA: Diagnosis not present

## 2020-02-28 NOTE — Patient Instructions (Addendum)
(  Exercise) Monday Tuesday Wednesday Thursday Friday Saturday Sunday   PWR! Moves Sitting           PWR! Moves Standing           Seated stretching/ Sit to stand           Low back exercises                      Walking                                             Access Code: [email or web address] URL: [email or web address] Date: 02/28/2020 Prepared by: Mady Haagensen  Exercises Seated Hamstring Stretch - 1 x daily - 7 x weekly - 1 sets - 3 reps - 30 sec hold Sit to Stand with Hands on Knees - 1 x daily - 7 x weekly - 1 sets - 10 reps Hooklying Single Knee to Chest Stretch - 1 x daily - 7 x weekly - 1 sets - 3-5 reps - 15 sec hold Supine Lower Trunk Rotation - 1 x daily - 7 x weekly - 1 sets - 5 reps - 15 sec hold Seated Scapular Retraction - 1 x daily - 3-4 x weekly - 1-2 sets - 10 reps - 3 sec hold Seated Cervical Retraction - 1 x daily - 3-4 x weekly - 1-2 sets - 10 reps - 3 sec hold

## 2020-02-29 NOTE — Therapy (Signed)
[facility] [address] [place], [place], [address] Phone: [phone]   Fax:  [phone]  Physical Therapy Treatment  Patient Details  Name: Alexander Thomas MRN: [id number] Date of Birth: January 24, 1946 Referring Provider (PT): Marcial Pacas   Encounter Date: 02/28/2020   PT End of Session - 02/28/20 1623    Visit Number 14    Number of Visits 18    Date for PT Re-Evaluation 04/02/20    Authorization Type Medicare/BCBS    PT Start Time 1620    PT Stop Time 1658    PT Time Calculation (min) 38 min    Equipment Utilized During Treatment Gait belt    Activity Tolerance Patient tolerated treatment well;No increased pain    Behavior During Therapy WFL for tasks assessed/performed           Past Medical History:  Diagnosis Date  . Hypercholesteremia   . Migraine   . Polymyositis ([place])   . Raynaud disease   . Tremor of right hand     Past Surgical History:  Procedure Laterality Date  . CARPAL TUNNEL RELEASE Left   . INGUINAL HERNIA REPAIR Bilateral   . WRIST SURGERY Right     There were no vitals filed for this visit.   Subjective Assessment - 02/28/20 1622    Subjective Raked about half my yard leaves this afternoon, so did my hip in.  Tried to remember my posture while I was outside.    Pertinent History PD, inflammatory polymyositis, Raynaud's, CTR LUE, R wrist surgery, back pain; ?shingles L hip pain    Diagnostic tests MRI of low back unremarkable    Patient Stated Goals Pt's goals for therapy are to help pain in back, help tremors in R hand    Currently in Pain? Yes    Pain Score 6     Pain Location Hip    Pain Orientation Left    Pain Descriptors / Indicators Sharp    Pain Type Chronic pain    Pain Onset More than a month ago    Pain Frequency Constant    Aggravating Factors  unsure    Pain Relieving Factors stretching, exercises                             [facility] Adult PT  Treatment/Exercise - 02/28/20 1620      Ambulation/Gait   Ambulation/Gait Yes    Ambulation/Gait Assistance 5: Supervision    Ambulation/Gait Assistance Details Occasional cues to stop and reset for more upright posture.    Ambulation Distance (Feet) 400 Feet   40 ft x 2; 400 ft   Assistive device None      Posture/Postural Control   Posture/Postural Control Postural limitations    Postural Limitations Rounded Shoulders;Forward head;Flexed trunk;Weight shift right    Posture Comments Seated scapular retraction x 5 reps, 2 sets, with cues for posture and techinque, neck retraction 2 sets x 5 reps with towel behind head for tactile cues; sit as "tall the wall", 5 reps x 30 seconds      Self-Care   Self-Care Other Self-Care Comments    Other Self-Care Comments  Discussed POC and pt's goals for therapy.  He feels he has what he needs and would likely be ready for d/c next week.  Briefly discussed return screen vs. eval in 6-9 months.            Therapeutic Exercise  Seated PWR! Up>Sit>stand>stand PWR! Up x 5 reps, with visual and verbal cues       PT Education - 02/29/20 1811    Education Details Additions to HEP (posture exercises) and exercise chart to help with tracking exercise performance through the week    Person(s) Educated Patient    Methods Explanation;Demonstration;Verbal cues;Handout    Comprehension Verbalized understanding;Returned demonstration            PT Short Term Goals - 02/12/20 1258      PT SHORT TERM GOAL #1   Title Pt will be independent with HEP for improved strength, balance, transfers, and gait.  TARGET 02/02/2020    Time 5    Period Weeks    Status Achieved      PT SHORT TERM GOAL #2   Title Pt will improve 5x sit<>stand to less than or equal to 15 seconds for improved functional strength and efficiency of transfers.    Baseline 19.34;15.09 sec 01/31/2020    Time 5    Period Weeks    Status Partially Met      PT SHORT TERM GOAL #3   Title  Pt will improve TUG score to less than or equal to 13.5 sec for decreased fall risk.    Baseline 15.65; 14.4 sec, 13.25 sec (at best) 01/31/2020    Time 5    Period Weeks    Status Achieved      PT SHORT TERM GOAL #4   Title Pt will verbalize at least 3 means to decrease low back pain for improved overall functional mobility.    Time 5    Period Weeks    Status Not Met      PT SHORT TERM GOAL #5   Title Pt will verbalize understanding of fall prevention in home environment.    Time 5    Period Weeks    Status Achieved             PT Long Term Goals - 02/21/20 1643      PT LONG TERM GOAL #1   Title Pt will be independent with progression of  HEP for improved strength, balance, transfers, and gait.  TARGET 03/08/2020    Time 9    Period Weeks    Status New      PT LONG TERM GOAL #2   Title Pt will improve 5x sit<>stand to less than or equal to 13 seconds for improved transfer efficiency and safety.    Time 9    Period Weeks      PT LONG TERM GOAL #3   Title MiniBESTest score to improved to at least 22/28 for decreased fall risk.    Baseline 17/28    Time 9    Period Weeks    Status New      PT LONG TERM GOAL #4   Title Pt will improve gait velocity to at least 2.62 ft/sec for improved gait efficiency and safety.    Baseline 2.42 ft/sec    Time 9    Period Weeks      PT LONG TERM GOAL #5   Title Pt will verbalize understanding of local Parkinson's disease resources.    Time 9    Period Weeks    Status New                 Plan - 02/29/20 1812    Clinical Impression Statement Pt feels he has been remembering to sit up straighter after working on  posture exercises last visit.  Addressed posture again today and pt agreeable to adding those seated posture exercises to HEP.  Discussed POC and pt feels he will be ready for d/c next week.    Personal Factors and Comorbidities Comorbidity 3+    Comorbidities PD, inflammatory polymyositis, Raynaud's, CTR LUE, R  wrist surgery, back pain    Examination-Activity Limitations Locomotion Level;Transfers;Stand    Examination-Participation Restrictions Church;Occupation;Community Activity;Interpersonal Relationship    PT Frequency 2x / week    PT Duration 8 weeks   8 weeks plus eval visit = 9 weeks   PT Treatment/Interventions ADLs/Self Care Home Management;Gait training;Stair training;Functional mobility training;Therapeutic activities;Therapeutic exercise;Balance training;Neuromuscular re-education;DME Instruction;Patient/family education    PT Next Visit Plan Begin checking LTGs; check updates to HEP.  Plan for d/c next week; will need to decide screen vs eval for return in 6-9 months. (I might say screens so he can easily see OT and speech)    Consulted and Agree with Plan of Care Patient           Patient will benefit from skilled therapeutic intervention in order to improve the following deficits and impairments:  Abnormal gait, Difficulty walking, Decreased balance, Decreased mobility, Decreased strength, Postural dysfunction  Visit Diagnosis: Abnormal posture  Other abnormalities of gait and mobility     Problem List Patient Active Problem List   Diagnosis Date Noted  . Gait abnormality 08/29/2019  . Midline low back pain without sciatica 08/29/2019  . Mild cognitive impairment 08/29/2019  . Excessive daytime sleepiness 01/24/2019  . Nocturia more than twice per night 01/24/2019  . Hypersomnia with sleep apnea 01/24/2019  . At risk for central sleep apnea 01/24/2019  . RLS (restless legs syndrome) 01/24/2019  . Idiopathic Parkinson's disease ([place]) 05/07/2015  . Polymyositis ([place]) 05/07/2015    Zienna Ahlin W. 02/29/2020, 6:15 PM  Frazier Butt., PT    [facility] [address] [place], [place], [address] Phone: [phone]   Fax:  [phone]  Name: Alexander Thomas MRN: [id number] Date of Birth: 1945-10-05

## 2020-03-04 ENCOUNTER — Other Ambulatory Visit: Payer: Self-pay

## 2020-03-04 ENCOUNTER — Ambulatory Visit: Payer: Medicare Other | Admitting: Physical Therapy

## 2020-03-04 VITALS — BP 94/54 | HR 94

## 2020-03-04 DIAGNOSIS — R2681 Unsteadiness on feet: Secondary | ICD-10-CM | POA: Diagnosis not present

## 2020-03-04 DIAGNOSIS — M6281 Muscle weakness (generalized): Secondary | ICD-10-CM | POA: Diagnosis not present

## 2020-03-04 DIAGNOSIS — R29818 Other symptoms and signs involving the nervous system: Secondary | ICD-10-CM | POA: Diagnosis not present

## 2020-03-04 DIAGNOSIS — R2689 Other abnormalities of gait and mobility: Secondary | ICD-10-CM | POA: Diagnosis not present

## 2020-03-04 DIAGNOSIS — R293 Abnormal posture: Secondary | ICD-10-CM | POA: Diagnosis not present

## 2020-03-04 NOTE — Therapy (Signed)
[facility] [address] [place], [place], [address] Phone: [phone]   Fax:  [phone]  Physical Therapy Treatment  Patient Details  Name: Alexander Thomas MRN: [id number] Date of Birth: 1945-12-10 Referring Provider (PT): Levert Feinstein   Encounter Date: 03/04/2020   PT End of Session - 03/04/20 2038    Visit Number 15    Number of Visits 18    Date for PT Re-Evaluation 04/02/20    Authorization Type Medicare/BCBS    PT Start Time 1318    PT Stop Time 1404    PT Time Calculation (min) 46 min    Equipment Utilized During Treatment Gait belt    Activity Tolerance Patient tolerated treatment well   limited by low BP   Behavior During Therapy [facility] for tasks assessed/performed           Past Medical History:  Diagnosis Date  . Hypercholesteremia   . Migraine   . Polymyositis (HCC)   . Raynaud disease   . Tremor of right hand     Past Surgical History:  Procedure Laterality Date  . CARPAL TUNNEL RELEASE Left   . INGUINAL HERNIA REPAIR Bilateral   . WRIST SURGERY Right     Vitals:   03/04/20 1322 03/04/20 1324 03/04/20 1346  BP: (!) 95/53 (!) 77/47 (!) 94/54  Pulse: 96 (!) 104 94     Subjective Assessment - 03/04/20 1325    Subjective Blood pressure was low today - was feeling dizzy/lightheadedness earlier. Reports that his doctors are aware - has been having this issue for years.    Pertinent History PD, inflammatory polymyositis, Raynaud's, CTR LUE, R wrist surgery, back pain; ?shingles L hip pain    Diagnostic tests MRI of low back unremarkable    Patient Stated Goals Pt's goals for therapy are to help pain in back, help tremors in R hand    Currently in Pain? Yes    Pain Score 6     Pain Location Hip    Pain Orientation Left    Pain Descriptors / Indicators Sharp    Pain Type Chronic pain    Pain Onset More than a month ago    Aggravating Factors  unsure    Pain Relieving Factors stretching,  exercises                Began reviewing pt's finalized HEP:    Seated PWR moves:   Pt performs PWR! Moves in sitting position:    PWR! Up for improved posture x10 reps - needing verbal and demo cues for proper technique   PWR! Rock for improved weighshifting x10 reps B - coming down to thigh and lifting up arm overhead - needing verbal and demo cues for proper technique  Pt reporting he has not been performing these much at home, after performing pt reports he feels like these would really help out his hip. Pt will benefit from further review at next session for final HEP.                [facility] Adult PT Treatment/Exercise - 03/04/20 0001      Therapeutic Activites    Therapeutic Activities Other Therapeutic Activities    Other Therapeutic Activities Provided pt handout for local PD resources, pt with no additional questions at this time. Pt with low BP today in sitting and standing (did not feel lightheaded or dizzy throughout session)with pt reports he sometimes gets like this and when asked pt  reports he has not drank much water today. Provided pt with water and reported importance of staying hydrated, esp with low BP values. Also gave pt info about orthostatic hypotension and ways to incr safety when performing transfers at home - demonstrated and educated pt to perform seated marching/ankle pumps before standing to help with circulation and discussed pt performing sit > stand and standing with tall posture for about 8-10 seconds to make sure he does not feel lightheaded before walking, pt verbalized understanding of instructions. Pt also goes to see neurologist tomorrow - wrote down pt's BP values today to bring in to appt tomorrow. Began to go over pt's HEP today, has HEP tracker in folder, however has not been using to check off exercises. At end of session therapist walked pt out to wife's car and made her aware of BP during session and how to incr pt's safety with lower  BP values and making sure pt stays hyrdated                  PT Education - 03/04/20 2038    Education Details see TA. discussed scheduling an additional visit (if unable to assess all goals and review HEP at next session due to being limited this session due to low BP)    Person(s) Educated Patient;Spouse    Methods Explanation;Demonstration    Comprehension Verbalized understanding;Returned demonstration            PT Short Term Goals - 02/12/20 1258      PT SHORT TERM GOAL #1   Title Pt will be independent with HEP for improved strength, balance, transfers, and gait.  TARGET 02/02/2020    Time 5    Period Weeks    Status Achieved      PT SHORT TERM GOAL #2   Title Pt will improve 5x sit<>stand to less than or equal to 15 seconds for improved functional strength and efficiency of transfers.    Baseline 19.34;15.09 sec 01/31/2020    Time 5    Period Weeks    Status Partially Met      PT SHORT TERM GOAL #3   Title Pt will improve TUG score to less than or equal to 13.5 sec for decreased fall risk.    Baseline 15.65; 14.4 sec, 13.25 sec (at best) 01/31/2020    Time 5    Period Weeks    Status Achieved      PT SHORT TERM GOAL #4   Title Pt will verbalize at least 3 means to decrease low back pain for improved overall functional mobility.    Time 5    Period Weeks    Status Not Met      PT SHORT TERM GOAL #5   Title Pt will verbalize understanding of fall prevention in home environment.    Time 5    Period Weeks    Status Achieved             PT Long Term Goals - 03/04/20 2045      PT LONG TERM GOAL #1   Title Pt will be independent with progression of  HEP for improved strength, balance, transfers, and gait.  TARGET 03/08/2020    Time 9    Period Weeks    Status New      PT LONG TERM GOAL #2   Title Pt will improve 5x sit<>stand to less than or equal to 13 seconds for improved transfer efficiency and safety.    Time  9    Period Weeks      PT  LONG TERM GOAL #3   Title MiniBESTest score to improved to at least 22/28 for decreased fall risk.    Baseline 17/28    Time 9    Period Weeks    Status New      PT LONG TERM GOAL #4   Title Pt will improve gait velocity to at least 2.62 ft/sec for improved gait efficiency and safety.    Baseline 2.42 ft/sec    Time 9    Period Weeks      PT LONG TERM GOAL #5   Title Pt will verbalize understanding of local Parkinson's disease resources.    Baseline provided handout on 03/04/20    Time 9    Period Weeks    Status Achieved                 Plan - 03/04/20 2039    Clinical Impression Statement Pt with low BP values today in sitting/standing - however pt reporting he is asymptomatic. Earlier today pt reported feeling a little bit lightheaded (pt stating he has a hx of low BP at times and physicians are aware). Began assessing pt's STGs - provided pt handout for local PD resources. Began to check pt's HEP with seated PWR moves, pt reporting he has not been performing at home. Will assess remaining LTGs at next session for anticipated D/C and may need to add an additional visit if unable to fully go over HEP/check LTGs.    Personal Factors and Comorbidities Comorbidity 3+    Comorbidities PD, inflammatory polymyositis, Raynaud's, CTR LUE, R wrist surgery, back pain    Examination-Activity Limitations Locomotion Level;Transfers;Stand    Examination-Participation Restrictions Church;Occupation;Community Activity;Interpersonal Relationship    PT Frequency 2x / week    PT Duration 8 weeks   8 weeks plus eval visit = 9 weeks   PT Treatment/Interventions ADLs/Self Care Home Management;Gait training;Stair training;Functional mobility training;Therapeutic activities;Therapeutic exercise;Balance training;Neuromuscular re-education;DME Instruction;Patient/family education    PT Next Visit Plan Begin checking LTGs; check updates to HEP and remainder of HEP. might need to add an additonal visit.  Plan for d/c next week; will need to decide screen vs eval for return in 6-9 months. (I might say screens so he can easily see OT and speech)    Consulted and Agree with Plan of Care Patient           Patient will benefit from skilled therapeutic intervention in order to improve the following deficits and impairments:  Abnormal gait, Difficulty walking, Decreased balance, Decreased mobility, Decreased strength, Postural dysfunction  Visit Diagnosis: Abnormal posture  Other abnormalities of gait and mobility  Muscle weakness (generalized)  Unsteadiness on feet     Problem List Patient Active Problem List   Diagnosis Date Noted  . Gait abnormality 08/29/2019  . Midline low back pain without sciatica 08/29/2019  . Mild cognitive impairment 08/29/2019  . Excessive daytime sleepiness 01/24/2019  . Nocturia more than twice per night 01/24/2019  . Hypersomnia with sleep apnea 01/24/2019  . At risk for central sleep apnea 01/24/2019  . RLS (restless legs syndrome) 01/24/2019  . Idiopathic Parkinson's disease ([place]) 05/07/2015  . Polymyositis ([place]) 05/07/2015    Arliss Journey , PT, DPT  03/04/2020, 8:46 PM  [facility] [address] [place], [place], [address] Phone: [phone]   Fax:  [phone]  Name: Alexander Thomas MRN: [id number] Date of Birth:  05/01/1945   

## 2020-03-05 ENCOUNTER — Ambulatory Visit: Payer: Medicare Other | Admitting: Neurology

## 2020-03-06 ENCOUNTER — Other Ambulatory Visit: Payer: Self-pay

## 2020-03-06 ENCOUNTER — Encounter: Payer: Self-pay | Admitting: Physical Therapy

## 2020-03-06 ENCOUNTER — Ambulatory Visit: Payer: Medicare Other | Admitting: Physical Therapy

## 2020-03-06 VITALS — BP 89/56 | HR 87

## 2020-03-06 DIAGNOSIS — R293 Abnormal posture: Secondary | ICD-10-CM | POA: Diagnosis not present

## 2020-03-06 DIAGNOSIS — R2681 Unsteadiness on feet: Secondary | ICD-10-CM

## 2020-03-06 DIAGNOSIS — M6281 Muscle weakness (generalized): Secondary | ICD-10-CM

## 2020-03-06 DIAGNOSIS — R29818 Other symptoms and signs involving the nervous system: Secondary | ICD-10-CM | POA: Diagnosis not present

## 2020-03-06 DIAGNOSIS — R2689 Other abnormalities of gait and mobility: Secondary | ICD-10-CM | POA: Diagnosis not present

## 2020-03-06 NOTE — Therapy (Signed)
[facility] [address] [place], [place], [address] Phone: [phone]   Fax:  [phone]  Physical Therapy Treatment  Patient Details  Name: Alexander Thomas MRN: [id number] Date of Birth: 1945-10-28 Referring Provider (PT): Marcial Pacas   Encounter Date: 03/06/2020   PT End of Session - 03/06/20 1955    Visit Number 16    Number of Visits 20   per recert 40/11/6759   Date for PT Re-Evaluation 05/05/20   4 week POC   Authorization Type Medicare/BCBS    Progress Note Due on Visit 20    PT Start Time 1539   PT late from previous PT session   PT Stop Time 1618    PT Time Calculation (min) 39 min    Equipment Utilized During Treatment Gait belt    Activity Tolerance Patient tolerated treatment well   limited by low BP   Behavior During Therapy WFL for tasks assessed/performed           Past Medical History:  Diagnosis Date  . Hypercholesteremia   . Migraine   . Polymyositis ([place])   . Raynaud disease   . Tremor of right hand     Past Surgical History:  Procedure Laterality Date  . CARPAL TUNNEL RELEASE Left   . INGUINAL HERNIA REPAIR Bilateral   . WRIST SURGERY Right     Vitals:   03/06/20 1547 03/06/20 1553  BP: (!) 87/58 (!) 89/56  Pulse: 89 87     Subjective Assessment - 03/06/20 1547    Subjective Had an episode earlier today, feeling faint.  I have done a few of my exercises.    Pertinent History PD, inflammatory polymyositis, Raynaud's, CTR LUE, R wrist surgery, back pain; ?shingles L hip pain    Diagnostic tests MRI of low back unremarkable    Patient Stated Goals Pt's goals for therapy are to help pain in back, help tremors in R hand    Currently in Pain? Yes    Pain Score 6     Pain Location Hip    Pain Orientation Left    Pain Descriptors / Indicators Sharp    Pain Type Chronic pain    Pain Onset More than a month ago    Pain Frequency Constant    Aggravating Factors  unsure    Pain  Relieving Factors stretching, exercises    Multiple Pain Sites No                             [facility] Adult PT Treatment/Exercise - 03/06/20 1555      Transfers   Transfers Sit to Stand;Stand to Sit    Sit to Stand 6: Modified independent (Device/Increase time);Without upper extremity assist;From chair/3-in-1    Five time sit to stand comments  15.31    Stand to Sit 6: Modified independent (Device/Increase time);Without upper extremity assist;To chair/3-in-1      Ambulation/Gait   Ambulation/Gait Yes    Ambulation/Gait Assistance 6: Modified independent (Device/Increase time)    Ambulation Distance (Feet) 200 Feet    Assistive device None    Gait Pattern Step-through pattern;Decreased arm swing - right;Decreased step length - right;Decreased step length - left;Shuffle;Lateral trunk lean to right;Trunk flexed    Ambulation Surface Level;Indoor    Gait velocity 11.9 sec = 2.76 ft/sec      Mini-BESTest   Sit To Stand Normal: Comes to stand without use of hands and  stabilizes independently.    Rise to Toes Normal: Stable for 3 s with maximum height.    Stand on one leg (left) Moderate: < 20 s   1.56   Stand on one leg (right) Severe: Unable    Stand on one leg - lowest score 0    Compensatory Stepping Correction - Forward No step, OR would fall if not caught, OR falls spontaneously.    Compensatory Stepping Correction - Backward No step, OR would fall if not caught, OR falls spontaneously.    Compensatory Stepping Correction - Left Lateral Severe: Falls, or cannot step    Compensatory Stepping Correction - Right Lateral Severe:  Falls, or cannot step    Stepping Corredtion Lateral - lowest score 0    Stance - Feet together, eyes open, firm surface  Normal: 30s    Stance - Feet together, eyes closed, foam surface  Normal: 30s    Incline - Eyes Closed Normal: Stands independently 30s and aligns with gravity    Change in Gait Speed Normal: Significantly changes walkling  speed without imbalance    Walk with head turns - Horizontal Moderate: performs head turns with reduction in gait speed.    Walk with pivot turns Moderate:Turns with feet close SLOW (>4 steps) with good balance.    Step over obstacles Moderate: Steps over box but touches box OR displays cautious behavior by slowing gait.    Timed UP & GO with Dual Task Moderate: Dual Task affects either counting OR walking (>10%) when compared to the TUG without Dual Task.   14.25/35.75   Mini-BEST total score 16      Self-Care   Self-Care Other Self-Care Comments    Other Self-Care Comments  Discussed today pt's POC and progress towards therapy goals, given pt's low BP measures the last 2 visits, still at fall risk; pt reports not remembering to consistently perform HEP, though he has been doing posture exercises.  Discussed option of having wife come in for several therapy sessions to help with carryover for HEP and strategies for improved posture, balance and gait.  Pt is agreeable; will recert for 3-4 additional visits for family education.                    PT Short Term Goals - 02/12/20 1258      PT SHORT TERM GOAL #1   Title Pt will be independent with HEP for improved strength, balance, transfers, and gait.  TARGET 02/02/2020    Time 5    Period Weeks    Status Achieved      PT SHORT TERM GOAL #2   Title Pt will improve 5x sit<>stand to less than or equal to 15 seconds for improved functional strength and efficiency of transfers.    Baseline 19.34;15.09 sec 01/31/2020    Time 5    Period Weeks    Status Partially Met      PT SHORT TERM GOAL #3   Title Pt will improve TUG score to less than or equal to 13.5 sec for decreased fall risk.    Baseline 15.65; 14.4 sec, 13.25 sec (at best) 01/31/2020    Time 5    Period Weeks    Status Achieved      PT SHORT TERM GOAL #4   Title Pt will verbalize at least 3 means to decrease low back pain for improved overall functional mobility.     Time 5    Period Weeks  Status Not Met      PT SHORT TERM GOAL #5   Title Pt will verbalize understanding of fall prevention in home environment.    Time 5    Period Weeks    Status Achieved             PT Long Term Goals - 03/06/20 1617      PT LONG TERM GOAL #1   Title Pt will be independent with progression of  HEP for improved strength, balance, transfers, and gait.  TARGET 03/08/2020    Baseline Not time to fully assess this visit, due to goal check; pt report not consistently doing.    Time 9    Period Weeks    Status On-going      PT LONG TERM GOAL #2   Title Pt will improve 5x sit<>stand to less than or equal to 13 seconds for improved transfer efficiency and safety.    Baseline 03/06/2020 15.31 sec    Time 9    Period Weeks    Status Not Met      PT LONG TERM GOAL #3   Title MiniBESTest score to improved to at least 21/28 for decreased fall risk.    Baseline 16/28 03/06/2020    Time 9    Period Weeks    Status Not Met      PT LONG TERM GOAL #4   Title Pt will improve gait velocity to at least 2.62 ft/sec for improved gait efficiency and safety.    Baseline 2.42 ft/sec; 2.76 ft/sec 03/06/2020    Time 9    Period Weeks    Status Achieved      PT LONG TERM GOAL #5   Title Pt will verbalize understanding of local Parkinson's disease resources.    Baseline provided handout on 03/04/20    Time 9    Period Weeks    Status Achieved           Updated LTGs:  PT Long Term Goals - 03/06/20 2010      PT LONG TERM GOAL #1   Title Pt will perform progression of  HEP with wife's cues/supervision, for improved strength, balance, transfers, and gait.  TARGET 04/12/20 (due to being out of town 1 week)    Baseline Not time to fully assess this visit, due to goal check; pt report not consistently doing.    Time 4    Period Weeks    Status Revised      PT LONG TERM GOAL #2   Title Pt will improve 5x sit<>stand to less than or equal to 13 seconds for improved  transfer efficiency and safety.    Baseline 03/06/2020 15.31 sec    Time 4    Period Weeks    Status On-going      PT LONG TERM GOAL #3   Title MiniBESTest score to improved to at least 21/28 for decreased fall risk.    Baseline 16/28 03/06/2020    Time 4    Period Weeks    Status On-going                Plan - 03/06/20 1959    Clinical Impression Statement Due to pt's low BP readings last visit and reports that earlier today, he had episode of feeling faint, PT assessed BP, with measures low again in session today.  He reports being asymptomatic during session.  Checked remaining LTGs, wtih pt not meeting LTG 1, 2, 3 (all ongoing); LTG  4 met for improved gait velocity.  Given pt's low BP measures previous 2 visits, encouraged pt to f/u with MD.  PT hesitant to d/c at this time, as pt doesn't seem to be moving as well as several weeks ago; he is not performing HEP consistently at thome and responds to cues well in therapy for bigger movement patterns.  Wife has not been present in sessions and pt/PT agreeable to recert POC for 1x/wk for 4 additional weeks for family education on HEP and cues for posture/gait to help with carryover for home.    Personal Factors and Comorbidities Comorbidity 3+    Comorbidities PD, inflammatory polymyositis, Raynaud's, CTR LUE, R wrist surgery, back pain    Examination-Activity Limitations Locomotion Level;Transfers;Stand    Examination-Participation Restrictions Church;Occupation;Community Activity;Interpersonal Relationship    PT Frequency 1x / week    PT Duration 4 weeks   Per recert [id number]/9476   PT Treatment/Interventions ADLs/Self Care Home Management;Gait training;Stair training;Functional mobility training;Therapeutic activities;Therapeutic exercise;Balance training;Neuromuscular re-education;DME Instruction;Patient/family education    PT Next Visit Plan Decided to recert-requested wife to come into at least 1-2 sessions for education on HEP,  optimal exercise upon d/c from PT, cues for posture and gait.  Check BP (ask if he f/u with MD)    Consulted and Agree with Plan of Care Patient           Patient will benefit from skilled therapeutic intervention in order to improve the following deficits and impairments:  Abnormal gait, Difficulty walking, Decreased balance, Decreased mobility, Decreased strength, Postural dysfunction  Visit Diagnosis: Other abnormalities of gait and mobility  Unsteadiness on feet  Muscle weakness (generalized)  Abnormal posture  Other symptoms and signs involving the nervous system     Problem List Patient Active Problem List   Diagnosis Date Noted  . Gait abnormality 08/29/2019  . Midline low back pain without sciatica 08/29/2019  . Mild cognitive impairment 08/29/2019  . Excessive daytime sleepiness 01/24/2019  . Nocturia more than twice per night 01/24/2019  . Hypersomnia with sleep apnea 01/24/2019  . At risk for central sleep apnea 01/24/2019  . RLS (restless legs syndrome) 01/24/2019  . Idiopathic Parkinson's disease ([place]) 05/07/2015  . Polymyositis ([place]) 05/07/2015    Milford Cilento W. 03/06/2020, 8:08 PM  Frazier Butt., PT   [facility] [address] [place], [place], [address] Phone: [phone]   Fax:  [phone]  Name: Alexander Thomas MRN: [id number] Date of Birth: May 16, 1945

## 2020-03-18 ENCOUNTER — Encounter: Payer: Self-pay | Admitting: Physical Therapy

## 2020-03-18 ENCOUNTER — Ambulatory Visit: Payer: Medicare Other | Admitting: Physical Therapy

## 2020-03-18 ENCOUNTER — Other Ambulatory Visit: Payer: Self-pay

## 2020-03-18 DIAGNOSIS — R293 Abnormal posture: Secondary | ICD-10-CM

## 2020-03-18 DIAGNOSIS — M6281 Muscle weakness (generalized): Secondary | ICD-10-CM | POA: Diagnosis not present

## 2020-03-18 DIAGNOSIS — R29818 Other symptoms and signs involving the nervous system: Secondary | ICD-10-CM | POA: Diagnosis not present

## 2020-03-18 DIAGNOSIS — R2689 Other abnormalities of gait and mobility: Secondary | ICD-10-CM

## 2020-03-18 DIAGNOSIS — R2681 Unsteadiness on feet: Secondary | ICD-10-CM

## 2020-03-18 NOTE — Therapy (Signed)
[facility] [address] [place], [place], [address] Phone: [phone]   Fax:  [phone]  Physical Therapy Treatment  Patient Details  Name: Alexander Thomas MRN: [id number] Date of Birth: [birth year] Referring Provider (PT): Marcial Pacas   Encounter Date: 03/18/2020   PT End of Session - 03/18/20 1323    Visit Number 17    Number of Visits 20   per recert 63/84/5364   Date for PT Re-Evaluation 05/05/20   4 week POC   Authorization Type Medicare/BCBS    Progress Note Due on Visit 20    PT Start Time 1231    PT Stop Time 1316    PT Time Calculation (min) 45 min    Equipment Utilized During Treatment Gait belt    Activity Tolerance Patient tolerated treatment well    Behavior During Therapy [facility] for tasks assessed/performed           Past Medical History:  Diagnosis Date  . Hypercholesteremia   . Migraine   . Polymyositis ([place])   . Raynaud disease   . Tremor of right hand     Past Surgical History:  Procedure Laterality Date  . CARPAL TUNNEL RELEASE Left   . INGUINAL HERNIA REPAIR Bilateral   . WRIST SURGERY Right     There were no vitals filed for this visit.   Subjective Assessment - 03/18/20 1241    Subjective Sees Dr. Krista Blue next week on Tuesday. Not feeling faint today. No falls.    Patient is accompained by: --   wife, [name]   Pertinent History PD, inflammatory polymyositis, Raynaud's, CTR LUE, R wrist surgery, back pain; ?shingles L hip pain    Diagnostic tests MRI of low back unremarkable    Patient Stated Goals Pt's goals for therapy are to help pain in back, help tremors in R hand    Currently in Pain? Yes    Pain Score 5     Pain Location Hip    Pain Orientation Left    Pain Descriptors / Indicators Sharp    Pain Type Chronic pain    Pain Onset More than a month ago    Aggravating Factors  unsure    Pain Relieving Factors stretching, exercises                  Reviewed pt's  HEP with wife present and reviewed rationale/intensity of movement needed while performing.    Pt performs PWR! Moves in sitting position   PWR! Up for improved posture x10 reps, cues for tall posture and scapular retraction   PWR! Rock for improved weighshifting x7 reps B, cues for wider BOS, resetting with tall posture in the middle, and looking up at hand   PWR! Twist for improved trunk rotation - attempted with arms extended, however pt with limited trunk rotation ROM and performing with incr forward flexed posture, tried multiple reps, but instead performed modified seated PWR twist with hand outside of pt's leg and looking over shoulder, cues for tall posture x6 reps B, modified instructions on pt's HEP sheet   PWR! Step for improved step initiation x7 reps B - step out and out and then step in and in   Cues provided for technique, intensity of movement and posture.    Pt performs PWR! Moves in supine position (with knees bent)  PWR! Up for improved posture - x10 reps verbal and tactile cues for technique   PWR! Rock for improved weighshifting -  tactile cues to reach up and over to tap therapist's hand, cues for incr intensity and using legs to help with weight shifting, pt improving with ROM each time x6 reps B      Pt's wife able to demo understanding of exercises and proper cueing to provide for pt to perform at home.   [place] Adult PT Treatment/Exercise - 03/18/20 0001      Ambulation/Gait   Ambulation/Gait Yes    Ambulation/Gait Assistance 5: Supervision    Ambulation/Gait Assistance Details pt in waiting room without wife present, asked pt if wife is around to be involved in PT session today - ambulated outdoors to pt's wife's car with cues for tall posture and incr step length while on paved surfaces. while indoors with wife present, educated on providing cues for tall posture and incr step length and well as to stop and reset with tall posture when pt gets more forwardly  flexed.    Ambulation Distance (Feet) 200 Feet   x2    Assistive device None    Gait Pattern Step-through pattern;Decreased arm swing - right;Decreased step length - right;Decreased step length - left;Shuffle;Lateral trunk lean to right;Trunk flexed    Ambulation Surface Indoor;Unlevel;Level;Outdoor;Paved                  PT Education - 03/18/20 1322    Education Details began to review HEP, cues for gait training    Person(s) Educated Patient;Spouse    Methods Explanation;Demonstration;Verbal cues    Comprehension Verbalized understanding;Returned demonstration            PT Short Term Goals - 03/06/20 2009      PT SHORT TERM GOAL #1   Title ---             PT Long Term Goals - 03/06/20 2010      PT LONG TERM GOAL #1   Title Pt will perform progression of  HEP with wife's cues/supervision, for improved strength, balance, transfers, and gait.  TARGET 04/12/20 (due to being out of town 1 week)    Baseline Not time to fully assess this visit, due to goal check; pt report not consistently doing.    Time 4    Period Weeks    Status Revised      PT LONG TERM GOAL #2   Title Pt will improve 5x sit<>stand to less than or equal to 13 seconds for improved transfer efficiency and safety.    Baseline 03/06/2020 15.31 sec    Time 4    Period Weeks    Status On-going      PT LONG TERM GOAL #3   Title MiniBESTest score to improved to at least 21/28 for decreased fall risk.    Baseline 16/28 03/06/2020    Time 4    Period Weeks    Status On-going                 Plan - 03/18/20 1333    Clinical Impression Statement Pt's wife present for session today. Began to review pt's HEP with wife and pt for increased carryover at home. Reviewed seated PWR and supine PWR (Up and Rock only) today. Modified seated PWR Twist to perform with twisting and looking over shoulder vs. performing with arms extended. Pt and wife with no questions at end of session. Discussed wife  coming in again during next session for review of remainder of pt's HEP. Will continue to progress towards LTGs.    Personal Factors  and Comorbidities Comorbidity 3+    Comorbidities PD, inflammatory polymyositis, Raynaud's, CTR LUE, R wrist surgery, back pain    Examination-Activity Limitations Locomotion Level;Transfers;Stand    Examination-Participation Restrictions Church;Occupation;Community Activity;Interpersonal Relationship    PT Frequency 1x / week    PT Duration 4 weeks   Per recert 06/12/3610   PT Treatment/Interventions ADLs/Self Care Home Management;Gait training;Stair training;Functional mobility training;Therapeutic activities;Therapeutic exercise;Balance training;Neuromuscular re-education;DME Instruction;Patient/family education    PT Next Visit Plan with wife present: review remainder of HEP, optimal exercise upon d/c from PT, cues for posture and gait.  Check BP (sees Dr. Krista Blue next Tuesday)    Consulted and Agree with Plan of Care Patient           Patient will benefit from skilled therapeutic intervention in order to improve the following deficits and impairments:  Abnormal gait, Difficulty walking, Decreased balance, Decreased mobility, Decreased strength, Postural dysfunction  Visit Diagnosis: Other abnormalities of gait and mobility  Unsteadiness on feet  Muscle weakness (generalized)  Abnormal posture     Problem List Patient Active Problem List   Diagnosis Date Noted  . Gait abnormality 08/29/2019  . Midline low back pain without sciatica 08/29/2019  . Mild cognitive impairment 08/29/2019  . Excessive daytime sleepiness 01/24/2019  . Nocturia more than twice per night 01/24/2019  . Hypersomnia with sleep apnea 01/24/2019  . At risk for central sleep apnea 01/24/2019  . RLS (restless legs syndrome) 01/24/2019  . Idiopathic Parkinson's disease ([place]) 05/07/2015  . Polymyositis ([place]) 05/07/2015    Arliss Journey, PT, DPT  03/18/2020, 1:35 PM  [facility] [address] [place], [place], [address] Phone: [phone]   Fax:  [phone]  Name: KERRY CHISOLM MRN: [id number] Date of Birth: 17-Jul-1945

## 2020-03-25 ENCOUNTER — Ambulatory Visit: Payer: Medicare Other | Attending: Neurology | Admitting: Physical Therapy

## 2020-03-25 ENCOUNTER — Other Ambulatory Visit: Payer: Self-pay

## 2020-03-25 DIAGNOSIS — R293 Abnormal posture: Secondary | ICD-10-CM | POA: Diagnosis not present

## 2020-03-25 DIAGNOSIS — R2689 Other abnormalities of gait and mobility: Secondary | ICD-10-CM | POA: Diagnosis not present

## 2020-03-25 DIAGNOSIS — M6281 Muscle weakness (generalized): Secondary | ICD-10-CM | POA: Diagnosis not present

## 2020-03-25 DIAGNOSIS — R2681 Unsteadiness on feet: Secondary | ICD-10-CM | POA: Diagnosis not present

## 2020-03-26 ENCOUNTER — Encounter: Payer: Self-pay | Admitting: Neurology

## 2020-03-26 ENCOUNTER — Ambulatory Visit (INDEPENDENT_AMBULATORY_CARE_PROVIDER_SITE_OTHER): Payer: Medicare Other | Admitting: Neurology

## 2020-03-26 VITALS — BP 117/79 | HR 71 | Ht 70.0 in | Wt 159.0 lb

## 2020-03-26 DIAGNOSIS — R269 Unspecified abnormalities of gait and mobility: Secondary | ICD-10-CM

## 2020-03-26 DIAGNOSIS — G20 Parkinson's disease: Secondary | ICD-10-CM | POA: Diagnosis not present

## 2020-03-26 DIAGNOSIS — M3320 Polymyositis, organ involvement unspecified: Secondary | ICD-10-CM | POA: Diagnosis not present

## 2020-03-26 MED ORDER — CARBIDOPA-LEVODOPA 25-100 MG PO TABS: 1.0000 | ORAL_TABLET | Freq: Four times a day (QID) | ORAL | 11 refills | Status: DC

## 2020-03-26 MED ORDER — AZATHIOPRINE 50 MG PO TABS
50.0000 mg | ORAL_TABLET | Freq: Two times a day (BID) | ORAL | 4 refills | Status: DC
Start: 2020-03-26 — End: 2020-12-02

## 2020-03-26 NOTE — Progress Notes (Signed)
HISTORY OF PRESENT ILLNESS: Alexander Thomas is a 74 years old right-handed male, accompanied by his wife, seen in refer by his primary care physician Dr. Jani Gravel for evaluation of right hand tremor, Initial evaluation was in August 2016  I have reviewed and summarized his most recent office note  He had a past medical history of hyperlipidemia, Raynaud's disease,, had left carpal tunnel release surgery, right wrist fracture surgery, polymyositis,  left deltoid muscle biopsy consistent with fiber atrophy, perivasculitis in August 2002, at its worst, he had difficulty standing up, need his wife help him dressing, he has taken methotrexate along with low-dose prednisone for many years, in 2015, he was switched to Imuran 50 mg twice a day, most recent CPK level was 83, he denies significant muscle weakness now  I also reviewed laboratory results in July 2016, normal CMP, CBC, CPK 83, mild elevated LDL 134, normal ESR 6, hepatitis panel was negative  He is a retired Company secretary, since 2014, he noticed mild right hand tremor, getting worse gradually, there was no significant gait difficulty, mild lack of facial expression, he has lack of smell, which has been a chronic issues, he denies REM sleep disorder, mild constipations.  Update February 04 2015: He was diagnosed with mild Parkinson's disease, was started on Azilect since August 2016, does not notice any symptomatic improvement either,  MRI of the brain without contrast in August 2016, mild supratentorium small vessel disease, no acute lesions.  UPDATE May 07 2015: He was started on requip xr titrating dose since October 2016, He still has mild right hand shaking, left hand is less, he is now taking Azilect 1 mg every day, requip xr 2 mg 2 tablets every night at 8pm.  He did not noticed any significant side effect.  It does help his tremor  He denies muscle weakness or muscle pain, he had a history of inflammatory polymyositis   UPDATE July 17th 2017: He is taking Azilect 46m qday, he tolerated requip xL 441m2 tabs po qhs well, he still has tremor, he sleeps well, he lost sense of smell, he has no significant gait abnormality, he has ok appetite, he exercise regularly,  5-10 mintues sit ups at that time, he denies obsessive compulsive behavior.  Update August 10 2016: Parkinson's Disease,  He is now taking ropinirole XL 4 mg 4 tablets every night, Azilect 1 mg every morning, there was no significant fluctuation noticed, he ambulate without much difficulty, does has intermittent right hand resting tremor,  Inflammatory polymyositis Imuran 50 mg 2 tablets every day He is doing well, still has significant right hand tremor,   UPDATE November 16 2017: He feels fine, continue to pastor a smaller church, taking Requip xl 53m36mtabs qhs, azilect 1mg20mily, imuran 50mg6mabs every day, he denies significant muscle weakness, stated he can walk better, but has mild increased drooling, he denies significant memory loss,  UPDATE Feb 6th 2020: He is accompanied by his wife at today's clinical visit, missing his medication frequently, slow worsening memory loss, Today's [place] examination is 23 out of 30 no significant muscle weakness, He still passed her small church of elderly attendants  UPDATE Aug 29 2019: He has constant right hand tremor, 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, was noted by his wife has increased gait abnormality, mild memory loss, MoCA examination is 21/30 today  He also complains of significant low back pain, stay at midline, unbearable sometimes, especially after prolonged standing or walking  Sleep  study in October 2020 showed no significant obstructive sleep apnea, he has frequent nocturia, wake up 3-4 times each night, usually able to go back to sleep without much difficulty  Update March 26, 2020 Wife concerned about his weight loss, he continued to minister 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  REVIEW OF SYSTEMS: Out of a complete 14 system review of symptoms, the patient complains only of the following symptoms, and all other reviewed systems are negative.  Tremor, apnea  ALLERGIES: No Known Allergies  HOME MEDICATIONS: Outpatient Medications Prior to Visit  Medication Sig Dispense Refill  . azaTHIOprine (IMURAN) 50 MG tablet Take 2 tablets (100 mg total) by mouth daily. 180 tablet 4  . carbidopa-levodopa (SINEMET IR) 25-100 MG tablet TAKE 1 TABLET BY MOUTH THREE TIMES A DAY 270 tablet 4  . COD LIVER OIL PO Take by mouth.    . doxycycline (VIBRAMYCIN) 100 MG capsule as needed.   3  . gabapentin (NEURONTIN) 300 MG capsule as needed.     . magnesium hydroxide (MILK OF MAGNESIA) 400 MG/5ML suspension Take 5 mLs by mouth as needed for mild constipation.    . rasagiline (AZILECT) 1 MG TABS tablet TAKE 1 TABLET BY MOUTH EVERY DAY 90 tablet 4  . sildenafil (VIAGRA) 50 MG tablet Take 50 mg by mouth as needed for erectile dysfunction.    . terbinafine (LAMISIL) 250 MG tablet Take 250 mg by mouth daily.     No facility-administered medications prior to visit.    PAST MEDICAL HISTORY: Past Medical History:  Diagnosis Date  . Hypercholesteremia   . Migraine   . Polymyositis ([place])   . Raynaud disease   . Tremor of right hand     PAST SURGICAL HISTORY: Past Surgical History:  Procedure Laterality Date  . CARPAL TUNNEL RELEASE Left   . INGUINAL HERNIA REPAIR Bilateral   . WRIST SURGERY Right     FAMILY HISTORY: Family History  Problem Relation Age of Onset  . Uterine cancer Mother   . Healthy Father   . Kidney cancer Brother   . Colon cancer Brother     SOCIAL HISTORY: Social History   Socioeconomic History  . Marital status: Married    Spouse name: Not on file  . Number of children: 2   . Years of education: PhD  . Highest education level: Not on file  Occupational History  . Occupation: Company secretary  Tobacco Use  . Smoking status: Never Smoker  . Smokeless tobacco: Never Used  Substance and Sexual Activity  . Alcohol use: No    Alcohol/week: 0.0 standard drinks  . Drug use: No  . Sexual activity: Not on file  Other Topics Concern  . Not on file  Social History Narrative   Lives at home with his wife.   Right-handed.   1 cup caffeine daily.   Social Determinants of Health   Financial Resource Strain:   . Difficulty of Paying Living Expenses: Not on file  Food Insecurity:   . Worried About Charity fundraiser in the Last Year: Not on file  . Ran Out of Food in the Last Year: Not on file  Transportation Needs:   . Lack of Transportation (Medical): Not on file  . Lack of Transportation (Non-Medical): Not on file  Physical Activity:   .  Days of Exercise per Week: Not on file  . Minutes of Exercise per Session: Not on file  Stress:   . Feeling of Stress : Not on file  Social Connections:   . Frequency of Communication with Friends and Family: Not on file  . Frequency of Social Gatherings with Friends and Family: Not on file  . Attends Religious Services: Not on file  . Active Member of Clubs or Organizations: Not on file  . Attends Archivist Meetings: Not on file  . Marital Status: Not on file  Intimate Partner Violence:   . Fear of Current or Ex-Partner: Not on file  . Emotionally Abused: Not on file  . Physically Abused: Not on file  . Sexually Abused: Not on file    PHYSICAL EXAM  Vitals:   03/26/20 1323  BP: 117/79  Pulse: 71  Weight: 159 lb (72.1 kg)  Height: 5' 10"  (1.778 m)   Body mass index is 22.81 kg/m.  PHYSICAL EXAMNIATION:  Gen: NAD, conversant, well nourised, well groomed                     Cardiovascular: Regular rate rhythm, no peripheral edema, warm, nontender. Eyes: Conjunctivae clear without exudates or  hemorrhage Neck: Supple, no carotid bruits. Pulmonary: Clear to auscultation bilaterally   NEUROLOGICAL EXAM:  MENTAL STATUS: Speech/Cognition: Microphonias, wet soft voice  MMSE - Mini Mental State Exam 03/26/2020  Orientation to time 5  Orientation to Place 5  Registration 3  Attention/ Calculation 5  Recall 2  Language- name 2 objects 2  Language- repeat 1  Language- follow 3 step command 3  Language- read & follow direction 1  Write a sentence 1  Copy design 1  Total score 29   CRANIAL NERVES: CN II: Visual fields are full to confrontation.  Pupils are round equal and briskly reactive to light. CN III, IV, VI: extraocular movement are normal. No ptosis. CN V: Facial sensation is intact to light touch. CN VII: Face is symmetric with normal eye closure and smile. CN VIII: Hearing is normal to casual conversation CN IX, X: Palate elevates symmetrically. Phonation is normal. CN XI: Head turning and shoulder shrug are intact    MOTOR: He has constant right upper extremity resting tremor, no weakness, right more than left limb rigidity, bradykinesia  REFLEXES: Reflexes are 1  and symmetric at the biceps, triceps, knees and ankles. Plantar responses are flexor.  SENSORY: Intact to light touch, pinprick, positional and vibratory sensation at fingers and toes.  COORDINATION: There is no trunk or limb ataxia.    GAIT/STANCE: He can get up from seated position arms crossed, campotphoria, mildly decreased right arm swing, decreased stride, mild En block turning  DIAGNOSTIC DATA (LABS, IMAGING, TESTING) - I reviewed patient records, labs, notes, testing and imaging myself where available.   ASSESSMENT AND PLAN 74 y.o. year old male  Idiopathic Parkinson's disease  Will increase Sinemet 25/100 mg to 4 times a day 7am, 11am, 1500, 1900.  Azilect 1 mg daily  Could not tolerate dopamine agonist Requip in the past, complains worsening memory loss,  History of polymyositis   Doing well, there was no muscle weakness noted, continue Imuran 50 mg twice a day  Get laboratory evaluation today  Worsening low back pain, gait abnormality,  MRI of lumbar to rule out lumbar radiculopathy  Refer to physical therapy   Marcial Pacas, M.D. Ph.D.  [facility] [address] [place], [place] [address]  Phone: [phone] Fax:      [phone]

## 2020-03-26 NOTE — Therapy (Signed)
[facility] [address] [place], [place], [address] Phone: [phone]   Fax:  [phone]  Physical Therapy Treatment  Patient Details  Name: Alexander Thomas MRN: [id number] Date of Birth: [birth year] Referring Provider (PT): Marcial Pacas   Encounter Date: 03/25/2020   PT End of Session - 03/26/20 2033    Visit Number 18    Number of Visits 20   per recert 71/09/2692   Date for PT Re-Evaluation 05/05/20   4 week POC   Authorization Type Medicare/BCBS    Authorization Time Period KX as of 03/25/2020 visit    Progress Note Due on Visit 20    PT Start Time 1319    PT Stop Time 1359    PT Time Calculation (min) 40 min    Equipment Utilized During Treatment --    Activity Tolerance Patient tolerated treatment well    Behavior During Therapy [facility] for tasks assessed/performed           Past Medical History:  Diagnosis Date  . Hypercholesteremia   . Migraine   . Polymyositis ([place])   . Raynaud disease   . Tremor of right hand     Past Surgical History:  Procedure Laterality Date  . CARPAL TUNNEL RELEASE Left   . INGUINAL HERNIA REPAIR Bilateral   . WRIST SURGERY Right     There were no vitals filed for this visit.   Subjective Assessment - 03/25/20 1319    Subjective No falls, no changes.  I've been doing the standing exercises with my wife.    Patient is accompained by: --   wife, [name]   Pertinent History PD, inflammatory polymyositis, Raynaud's, CTR LUE, R wrist surgery, back pain; ?shingles L hip pain    Diagnostic tests MRI of low back unremarkable    Patient Stated Goals Pt's goals for therapy are to help pain in back, help tremors in R hand    Currently in Pain? Yes    Pain Score 5     Pain Location Hip    Pain Orientation Left    Pain Descriptors / Indicators Sharp    Pain Type Chronic pain    Pain Onset More than a month ago    Pain Frequency Constant    Aggravating Factors  unsure    Pain  Relieving Factors stretching, exercise            Reviewed pt's HEP, with pt's pictures from HEP in folder.  Began in supine:  Wife present for instruction.  Pt verbalizes/demo understanding with min cues.  Hooklying Single Knee to Chest Stretch - 1 x daily - 7 x weekly - 1 sets - 3-5 reps - 15 sec hold Supine Lower Trunk Rotation - 1 x daily - 7 x weekly - 1 sets - 5 reps - 15 sec hold Supine PWR! Up (through shoulders x 10 reps)-knees bent Supine PWR! Rock (knees bent, reach up and across, x 5-10 reps each side)-Cues for diagonal reach   The performed exercises in sitting: Seated neck retraction x 5 reps, 3 sec hold Seated scapular retraction x 5 reps (ended up d/c this and instructed pt to perform seated PWR! Up instead to get this same motion with better positioning/technique) Seated Hamstring Stretch - 1 x daily - 7 x weekly - 1 sets - 3 reps - 30 sec hold Sit to Stand with Hands on Knees - 1 x daily - 7 x weekly - 1 sets - 10 reps-Cues  upon standing for best posture, terminal knee extension    Then performed standing PWR! Moves at [employer]! Up for posture x 10 reps-cues for wide BOS and for upright posture through shoulders, elbows, hands upon standing  PWR! Rock with same side UE reach, x 10 reps each side-cues for extended elbow and open hands  PWR! Twist (modified) reaching across body, x 5 reps each side-cues for extended elbow and open hands  PWR! Step x 10 reps each side-pt taking smaller steps with decreased foot clearance.  PT stops pt several times and cues for higher step and increased step length.  Discussed with pt and wife instances where these exercises come into real-life situations and how they are important.  Discussed wife's cueing for exercise technique and ability to provide cues in daily activities to help remind him of optimal posture and large movement patterns.                      PT Short Term Goals - 03/06/20 2009      PT  SHORT TERM GOAL #1   Title ---             PT Long Term Goals - 03/06/20 2010      PT LONG TERM GOAL #1   Title Pt will perform progression of  HEP with wife's cues/supervision, for improved strength, balance, transfers, and gait.  TARGET 04/12/20 (due to being out of town 1 week)    Baseline Not time to fully assess this visit, due to goal check; pt report not consistently doing.    Time 4    Period Weeks    Status Revised      PT LONG TERM GOAL #2   Title Pt will improve 5x sit<>stand to less than or equal to 13 seconds for improved transfer efficiency and safety.    Baseline 03/06/2020 15.31 sec    Time 4    Period Weeks    Status On-going      PT LONG TERM GOAL #3   Title MiniBESTest score to improved to at least 21/28 for decreased fall risk.    Baseline 16/28 03/06/2020    Time 4    Period Weeks    Status On-going                 Plan - 03/26/20 2034    Clinical Impression Statement Continued review of HEP this visit, including supine low back flexibility, seated postural and standing PWR! Moves.  Wife present again today and verbalizes understanding of HEP.  Also discussed with pt and wife how she can cue him for reminders for better posture and larger movement patterns for more optimal movement.  Will plan to check goals and d/c next 1-2 visits.    Personal Factors and Comorbidities Comorbidity 3+    Comorbidities PD, inflammatory polymyositis, Raynaud's, CTR LUE, R wrist surgery, back pain    Examination-Activity Limitations Locomotion Level;Transfers;Stand    Examination-Participation Restrictions Church;Occupation;Community Activity;Interpersonal Relationship    PT Frequency 1x / week    PT Duration 4 weeks   Per recert 04/22/7251   PT Treatment/Interventions ADLs/Self Care Home Management;Gait training;Stair training;Functional mobility training;Therapeutic activities;Therapeutic exercise;Balance training;Neuromuscular re-education;DME  Instruction;Patient/family education    PT Next Visit Plan with wife present: continue review remainder of HEP, optimal exercise upon d/c from PT, cues for posture and gait.  Follow up regarding pt's appt wtih Dr. Krista Blue.    Consulted and Agree with Plan of  Care Patient;Family member/caregiver    Family Member Consulted wife           Patient will benefit from skilled therapeutic intervention in order to improve the following deficits and impairments:  Abnormal gait, Difficulty walking, Decreased balance, Decreased mobility, Decreased strength, Postural dysfunction  Visit Diagnosis: Abnormal posture  Muscle weakness (generalized)  Unsteadiness on feet     Problem List Patient Active Problem List   Diagnosis Date Noted  . Gait abnormality 08/29/2019  . Midline low back pain without sciatica 08/29/2019  . Mild cognitive impairment 08/29/2019  . Excessive daytime sleepiness 01/24/2019  . Nocturia more than twice per night 01/24/2019  . Hypersomnia with sleep apnea 01/24/2019  . At risk for central sleep apnea 01/24/2019  . RLS (restless legs syndrome) 01/24/2019  . Idiopathic Parkinson's disease ([place]) 05/07/2015  . Polymyositis ([place]) 05/07/2015    Darol Cush W. 03/26/2020, 8:44 PM  Frazier Butt., PT   [facility] [address] [place], [place], [address] Phone: [phone]   Fax:  [phone]  Name: BURGESS SHERIFF MRN: [id number] Date of Birth: [birth year]

## 2020-03-27 ENCOUNTER — Telehealth: Payer: Self-pay | Admitting: *Deleted

## 2020-03-27 LAB — COMPREHENSIVE METABOLIC PANEL
ALT: 8 IU/L (ref 0–44)
AST: 13 IU/L (ref 0–40)
Albumin/Globulin Ratio: 1.4 (ref 1.2–2.2)
Albumin: 4.2 g/dL (ref 3.7–4.7)
Alkaline Phosphatase: 76 IU/L (ref 44–121)
BUN/Creatinine Ratio: 17 (ref 10–24)
BUN: 14 mg/dL (ref 8–27)
Bilirubin Total: 0.2 mg/dL (ref 0.0–1.2)
CO2: 30 mmol/L — ABNORMAL HIGH (ref 20–29)
Calcium: 9.4 mg/dL (ref 8.6–10.2)
Chloride: 98 mmol/L (ref 96–106)
Creatinine, Ser: 0.83 mg/dL (ref 0.76–1.27)
GFR calc Af Amer: 100 mL/min/{1.73_m2} (ref >59)
GFR calc non Af Amer: 87 mL/min/{1.73_m2} (ref >59)
Globulin, Total: 3 g/dL (ref 1.5–4.5)
Glucose: 87 mg/dL (ref 65–99)
Potassium: 4.7 mmol/L (ref 3.5–5.2)
Sodium: 138 mmol/L (ref 134–144)
Total Protein: 7.2 g/dL (ref 6.0–8.5)

## 2020-03-27 LAB — CBC WITH DIFFERENTIAL
Basophils Absolute: 0 10*3/uL (ref 0.0–0.2)
Basos: 0 %
EOS (ABSOLUTE): 0.1 10*3/uL (ref 0.0–0.4)
Eos: 1 %
Hematocrit: 44 % (ref 37.5–51.0)
Hemoglobin: 14.6 g/dL (ref 13.0–17.7)
Immature Grans (Abs): 0 10*3/uL (ref 0.0–0.1)
Immature Granulocytes: 0 %
Lymphocytes Absolute: 1.7 10*3/uL (ref 0.7–3.1)
Lymphs: 30 %
MCH: 30.5 pg (ref 26.6–33.0)
MCHC: 33.2 g/dL (ref 31.5–35.7)
MCV: 92 fL (ref 79–97)
Monocytes Absolute: 0.6 10*3/uL (ref 0.1–0.9)
Monocytes: 10 %
Neutrophils Absolute: 3.4 10*3/uL (ref 1.4–7.0)
Neutrophils: 59 %
RBC: 4.78 x10E6/uL (ref 4.14–5.80)
RDW: 13.1 % (ref 11.6–15.4)
WBC: 5.8 10*3/uL (ref 3.4–10.8)

## 2020-03-27 LAB — TSH: TSH: 2.03 u[IU]/mL (ref 0.450–4.500)

## 2020-03-27 NOTE — Telephone Encounter (Signed)
I spoke to patient and provided him with his lab results.

## 2020-03-27 NOTE — Telephone Encounter (Signed)
-----   Message from Marcial Pacas, MD sent at 03/27/2020 11:25 AM EST ----- Please call patient for normal laboratory result

## 2020-04-02 ENCOUNTER — Ambulatory Visit: Payer: Medicare Other | Admitting: Physical Therapy

## 2020-04-02 ENCOUNTER — Encounter: Payer: Self-pay | Admitting: Physical Therapy

## 2020-04-02 ENCOUNTER — Other Ambulatory Visit: Payer: Self-pay

## 2020-04-02 DIAGNOSIS — R293 Abnormal posture: Secondary | ICD-10-CM | POA: Diagnosis not present

## 2020-04-02 DIAGNOSIS — M6281 Muscle weakness (generalized): Secondary | ICD-10-CM

## 2020-04-02 DIAGNOSIS — R2689 Other abnormalities of gait and mobility: Secondary | ICD-10-CM | POA: Diagnosis not present

## 2020-04-02 DIAGNOSIS — R2681 Unsteadiness on feet: Secondary | ICD-10-CM | POA: Diagnosis not present

## 2020-04-02 NOTE — Therapy (Signed)
[facility] [address] [place], [place], [address] Phone: [phone]   Fax:  [phone]  Physical Therapy Treatment  Patient Details  Name: Alexander Thomas MRN: [id number] Date of Birth: 1945/10/05 Referring Provider (PT): Marcial Pacas   Encounter Date: 04/02/2020   PT End of Session - 04/02/20 1415    Visit Number 19    Number of Visits 20   per recert 81/82/9937   Date for PT Re-Evaluation 05/05/20   4 week POC   Authorization Type Medicare/BCBS    Authorization Time Period KX as of 03/25/2020 visit    Progress Note Due on Visit 20    PT Start Time 1319    PT Stop Time 1405    PT Time Calculation (min) 46 min    Activity Tolerance Patient tolerated treatment well    Behavior During Therapy [facility] for tasks assessed/performed           Past Medical History:  Diagnosis Date  . Hypercholesteremia   . Migraine   . Polymyositis ([place])   . Raynaud disease   . Tremor of right hand     Past Surgical History:  Procedure Laterality Date  . CARPAL TUNNEL RELEASE Left   . INGUINAL HERNIA REPAIR Bilateral   . WRIST SURGERY Right     There were no vitals filed for this visit.   Subjective Assessment - 04/02/20 1329    Subjective Had appt with Dr Krista Blue and she increased the medicine.    Patient is accompained by: --   wife, [name]   Pertinent History PD, inflammatory polymyositis, Raynaud's, CTR LUE, R wrist surgery, back pain; ?shingles L hip pain    Diagnostic tests MRI of low back unremarkable    Patient Stated Goals Pt's goals for therapy are to help pain in back, help tremors in R hand    Currently in Pain? Yes    Pain Score 4    increases to a 6-7 with movement   Pain Location Hip    Pain Orientation Left    Pain Descriptors / Indicators Sharp    Pain Type Chronic pain    Pain Onset More than a month ago    Pain Frequency Constant    Aggravating Factors  unsure    Pain Relieving Factors stretches and  exercises           Performed seated PWR! Moves  PWR! Up for posture x 10 reps-cues for full shoulder ROM, and intensity along with posture.  Pt with difficulty maintaining posture and states he felt like he was sliding forward out of chair when I had him increase shoulder abd at end range.  Had pt move to chair with arms which had increased back/seat support and pt with improved ability to maintain posture.    PWR! Rock x 10 reps each side-cues for looking up at hand, open hands, keeping palm forward and reaching as high as able.  PWR! Twist 10 reps with cues for resetting posture between twists, keeping one arm in place while reaching with other arm and increasing intensity of clap.  Had pt move to chair without arms for PWR! Step x 10 reps each side-cues for increased step height along with intensity of step.    PT Education - 04/02/20 1412    Education Details Importance of taking medication consistently as prescribed along with not eating protein 30 min before or <90 after medication, Seated PWR!, trying towel roll at lumbar area to  increase ROM, sitting in chair that is supportive for his back vs slouched on couch    Person(s) Educated Patient;Spouse    Methods Explanation;Demonstration;Verbal cues    Comprehension Verbalized understanding            PT Short Term Goals - 03/06/20 2009      PT SHORT TERM GOAL #1   Title ---             PT Long Term Goals - 03/06/20 2010      PT LONG TERM GOAL #1   Title Pt will perform progression of  HEP with wife's cues/supervision, for improved strength, balance, transfers, and gait.  TARGET 04/12/20 (due to being out of town 1 week)    Baseline Not time to fully assess this visit, due to goal check; pt report not consistently doing.    Time 4    Period Weeks    Status Revised      PT LONG TERM GOAL #2   Title Pt will improve 5x sit<>stand to less than or equal to 13 seconds for improved transfer efficiency and safety.     Baseline 03/06/2020 15.31 sec    Time 4    Period Weeks    Status On-going      PT LONG TERM GOAL #3   Title MiniBESTest score to improved to at least 21/28 for decreased fall risk.    Baseline 16/28 03/06/2020    Time 4    Period Weeks    Status On-going                 Plan - 04/02/20 1416    Clinical Impression Statement Session consisted of education of pt and wife and remainder of HEP along with additional tips for posture and medication management as prescribed by MD.  Per primary PT, plan to check goals next visit and d/c.    Personal Factors and Comorbidities Comorbidity 3+    Comorbidities PD, inflammatory polymyositis, Raynaud's, CTR LUE, R wrist surgery, back pain    Examination-Activity Limitations Locomotion Level;Transfers;Stand    Examination-Participation Restrictions Church;Occupation;Community Activity;Interpersonal Relationship    PT Frequency 1x / week    PT Duration 4 weeks   Per recert 37/16/9678   PT Treatment/Interventions ADLs/Self Care Home Management;Gait training;Stair training;Functional mobility training;Therapeutic activities;Therapeutic exercise;Balance training;Neuromuscular re-education;DME Instruction;Patient/family education    PT Next Visit Plan Wife unable to be present at next visit so can send written note/instructions to her if needed.  Check goals and d/c.    Consulted and Agree with Plan of Care Patient;Family member/caregiver    Family Member Consulted wife-[name]           Patient will benefit from skilled therapeutic intervention in order to improve the following deficits and impairments:  Abnormal gait,Difficulty walking,Decreased balance,Decreased mobility,Decreased strength,Postural dysfunction  Visit Diagnosis: Abnormal posture  Muscle weakness (generalized)  Unsteadiness on feet     Problem List Patient Active Problem List   Diagnosis Date Noted  . Gait abnormality 08/29/2019  . Midline low back pain without  sciatica 08/29/2019  . Mild cognitive impairment 08/29/2019  . Excessive daytime sleepiness 01/24/2019  . Nocturia more than twice per night 01/24/2019  . Hypersomnia with sleep apnea 01/24/2019  . At risk for central sleep apnea 01/24/2019  . RLS (restless legs syndrome) 01/24/2019  . Idiopathic Parkinson's disease ([place]) 05/07/2015  . Polymyositis ([place]) 05/07/2015    Narda Bonds, PTA [facility] 04/02/20 2:28 PM Phone: [phone] Fax: [phone]  [facility] [address] [place], [place], [address] Phone: [phone]   Fax:  [phone]  Name: Alexander Thomas MRN: [id number] Date of Birth: 08-06-1945

## 2020-04-09 ENCOUNTER — Other Ambulatory Visit: Payer: Self-pay

## 2020-04-09 ENCOUNTER — Ambulatory Visit: Payer: Medicare Other | Admitting: Physical Therapy

## 2020-04-09 ENCOUNTER — Encounter: Payer: Self-pay | Admitting: Physical Therapy

## 2020-04-09 DIAGNOSIS — R2689 Other abnormalities of gait and mobility: Secondary | ICD-10-CM | POA: Diagnosis not present

## 2020-04-09 DIAGNOSIS — R2681 Unsteadiness on feet: Secondary | ICD-10-CM | POA: Diagnosis not present

## 2020-04-09 DIAGNOSIS — R293 Abnormal posture: Secondary | ICD-10-CM

## 2020-04-09 DIAGNOSIS — M6281 Muscle weakness (generalized): Secondary | ICD-10-CM | POA: Diagnosis not present

## 2020-04-09 NOTE — Therapy (Signed)
[facility] [address] [place], [place], [address] Phone: [phone]   Fax:  [phone]  Physical Therapy Treatment/Discharge Summary  Patient Details  Name: Alexander Thomas MRN: [id number] Date of Birth: 21-Jul-1945 Referring Provider (PT): Marcial Pacas   Encounter Date: 04/09/2020   PT End of Session - 04/09/20 1435    Visit Number 20    Number of Visits 20   per recert 83/15/1761   Date for PT Re-Evaluation 05/05/20   4 week POC   Authorization Type Medicare/BCBS    Authorization Time Period KX as of 03/25/2020 visit    Progress Note Due on Visit 20    PT Start Time 1319    PT Stop Time 1400    PT Time Calculation (min) 41 min    Activity Tolerance Patient tolerated treatment well    Behavior During Therapy [facility] for tasks assessed/performed           Past Medical History:  Diagnosis Date   Hypercholesteremia    Migraine    Polymyositis ([place])    Raynaud disease    Tremor of right hand     Past Surgical History:  Procedure Laterality Date   CARPAL TUNNEL RELEASE Left    INGUINAL HERNIA REPAIR Bilateral    WRIST SURGERY Right     There were no vitals filed for this visit.   Subjective Assessment - 04/09/20 1321    Subjective Feel like I've gotten better and I have the tools I need to do well.    Patient is accompained by: --   wife, [name]   Pertinent History PD, inflammatory polymyositis, Raynaud's, CTR LUE, R wrist surgery, back pain; ?shingles L hip pain    Diagnostic tests MRI of low back unremarkable    Patient Stated Goals Pt's goals for therapy are to help pain in back, help tremors in R hand    Currently in Pain? Yes    Pain Score 5     Pain Location Hip    Pain Orientation Left    Pain Descriptors / Indicators Sharp    Pain Type Chronic pain    Pain Onset More than a month ago    Pain Frequency Constant    Aggravating Factors  unsure-it is always there    Pain Relieving  Factors stretches and exercises                             [facility] Adult PT Treatment/Exercise - 04/09/20 0001      Transfers   Transfers Sit to Stand;Stand to Sit    Sit to Stand 6: Modified independent (Device/Increase time);Without upper extremity assist;From chair/3-in-1    Five time sit to stand comments  12.53    Stand to Sit 6: Modified independent (Device/Increase time);Without upper extremity assist;To chair/3-in-1      Ambulation/Gait   Ambulation/Gait Yes    Ambulation/Gait Assistance 5: Supervision;6: Modified independent (Device/Increase time)    Ambulation Distance (Feet) 200 Feet   additional gait between activities in session   Assistive device None    Gait Pattern Step-through pattern;Decreased arm swing - right;Decreased step length - right;Decreased step length - left;Shuffle;Lateral trunk lean to right;Trunk flexed    Ambulation Surface Level;Indoor    Gait velocity 10.66 sec =32      Timed Up and Go Test   TUG Normal TUG;Cognitive TUG    Normal TUG (seconds) 12.5    Cognitive  TUG (seconds) 19.66      Mini-BESTest   Sit To Stand Normal: Comes to stand without use of hands and stabilizes independently.    Rise to Toes Normal: Stable for 3 s with maximum height.    Stand on one leg (left) Moderate: < 20 s   1.68, 1.19   Stand on one leg (right) Moderate: < 20 s   1.54, 4.16   Stand on one leg - lowest score 1    Compensatory Stepping Correction - Forward Moderate: More than one step is required to recover equilibrium    Compensatory Stepping Correction - Backward Moderate: More than one step is required to recover equilibrium    Compensatory Stepping Correction - Left Lateral Severe: Falls, or cannot step    Compensatory Stepping Correction - Right Lateral Severe:  Falls, or cannot step    Stepping Corredtion Lateral - lowest score 0    Stance - Feet together, eyes open, firm surface  Normal: 30s    Stance - Feet together, eyes closed, foam  surface  Normal: 30s    Incline - Eyes Closed Normal: Stands independently 30s and aligns with gravity    Change in Gait Speed Normal: Significantly changes walkling speed without imbalance    Walk with head turns - Horizontal Normal: performs head turns with no change in gait speed and good balance    Walk with pivot turns Normal: Turns with feet close FAST (< 3 steps) with good balance.    Step over obstacles Moderate: Steps over box but touches box OR displays cautious behavior by slowing gait.    Timed UP & GO with Dual Task Moderate: Dual Task affects either counting OR walking (>10%) when compared to the TUG without Dual Task.    Mini-BEST total score 21      Self-Care   Self-Care Other Self-Care Comments    Other Self-Care Comments  Discussed progress towards goals and overall progress in physical therapy.  Discussed discharge plans today and importance of continued, consistent HEP; discussed return screens, in order for PT to check in as well as OT and speech to get baselines and make recommendations, in 6-8 months.  Pt in agreement.            Reviewed standing PWR! Moves, using chair as support and visual cues of his paper HEP handout.  Pt performs 10 reps each, PWR! Up, PWR! [place], [place]! Twist (x 5 reps each side), PWR! Step (with cues for UE support and focus on step length and foot clearance).  Pt performs these exercises with occasional cues for technique.      PT Education - 04/09/20 1434    Education Details Progress towards goals, POC and plans for d/c this visit.    Person(s) Educated Patient    Methods Explanation    Comprehension Verbalized understanding            PT Short Term Goals - 03/06/20 2009      PT SHORT TERM GOAL #1   Title ---             PT Long Term Goals - 04/09/20 1347      PT LONG TERM GOAL #1   Title Pt will perform progression of  HEP with wife's cues/supervision, for improved strength, balance, transfers, and gait.  TARGET 04/12/20  (due to being out of town 1 week)    Baseline Achieved for what has been reviewed in past several PT sessions.    Time  4    Period Weeks    Status Achieved      PT LONG TERM GOAL #2   Title Pt will improve 5x sit<>stand to less than or equal to 13 seconds for improved transfer efficiency and safety.    Baseline 03/06/2020 15.31 sec; 04/09/20:  12.53 sec    Time 4    Period Weeks    Status Achieved      PT LONG TERM GOAL #3   Title MiniBESTest score to improved to at least 21/28 for decreased fall risk.    Baseline 16/28 03/06/2020; 21/28 04/09/2020    Time 4    Period Weeks    Status Achieved                 Plan - 04/09/20 1437    Clinical Impression Statement 20th Visit progress note (discharge summary), covering dates 10/27/201-04/09/20.  Subjectively, pt reports overall feeling that he is moving better.  Objective measures:  5x sit>stand:  12.53 sec, gait velocity 3.08 ft/sec, TUG 12.5 sec, MiniBESTest score 21/28.  All objective measures have improved since eval.  Pt has met all 3 of 3 LTGs.  He continues to beneift from cues for posture and step length at times, and wife has been present several sessions for instruction.  Pt is appropriate for d/c from PT this visit.    Personal Factors and Comorbidities Comorbidity 3+    Comorbidities PD, inflammatory polymyositis, Raynaud's, CTR LUE, R wrist surgery, back pain    Examination-Activity Limitations Locomotion Level;Transfers;Stand    Examination-Participation Restrictions Church;Occupation;Community Activity;Interpersonal Relationship    PT Frequency 1x / week    PT Duration 4 weeks   Per recert 51/88/4166   PT Treatment/Interventions ADLs/Self Care Home Management;Gait training;Stair training;Functional mobility training;Therapeutic activities;Therapeutic exercise;Balance training;Neuromuscular re-education;DME Instruction;Patient/family education    PT Next Visit Plan D/C this visit, with plans for return PT, OT, speech  therapy screens.    Consulted and Agree with Plan of Care Patient;Family member/caregiver    Family Member Consulted wife-[name]           Patient will benefit from skilled therapeutic intervention in order to improve the following deficits and impairments:  Abnormal gait,Difficulty walking,Decreased balance,Decreased mobility,Decreased strength,Postural dysfunction  Visit Diagnosis: Unsteadiness on feet  Other abnormalities of gait and mobility  Abnormal posture     Problem List Patient Active Problem List   Diagnosis Date Noted   Gait abnormality 08/29/2019   Midline low back pain without sciatica 08/29/2019   Mild cognitive impairment 08/29/2019   Excessive daytime sleepiness 01/24/2019   Nocturia more than twice per night 01/24/2019   Hypersomnia with sleep apnea 01/24/2019   At risk for central sleep apnea 01/24/2019   RLS (restless legs syndrome) 01/24/2019   Idiopathic Parkinson's disease ([place]) 05/07/2015   Polymyositis ([place]) 05/07/2015    Keidrick Murty W. 04/09/2020, 2:42 PM  Frazier Butt., PT   [facility] [address] [place], [place], [address] Phone: [phone]   Fax:  [phone]  Name: Alexander Thomas MRN: [id number] Date of Birth: [birth year]   PHYSICAL THERAPY DISCHARGE SUMMARY  Visits from Start of Care: 20  Current functional level related to goals / functional outcomes:  PT Long Term Goals - 04/09/20 1347      PT LONG TERM GOAL #1   Title Pt will perform progression of  HEP with wife's cues/supervision, for improved strength, balance, transfers, and gait.  TARGET 04/12/20 (due to being out of town 1  week)    Baseline Achieved for what has been reviewed in past several PT sessions.    Time 4    Period Weeks    Status Achieved      PT LONG TERM GOAL #2   Title Pt will improve 5x sit<>stand to less than or equal to 13 seconds for improved transfer efficiency and  safety.    Baseline 03/06/2020 15.31 sec; 04/09/20:  12.53 sec    Time 4    Period Weeks    Status Achieved      PT LONG TERM GOAL #3   Title MiniBESTest score to improved to at least 21/28 for decreased fall risk.    Baseline 16/28 03/06/2020; 21/28 04/09/2020    Time 4    Period Weeks    Status Achieved             Remaining deficits: Fall risk, posture, bradykinesia   Education / Equipment: Educated in [employer]  Plan: Patient agrees to discharge.  Patient goals were met. Patient is being discharged due to meeting the stated rehab goals.  ?????Plan for return PT screen in addition to OT, speech screens in 6-8 months.      Mady Haagensen, PT 04/09/20 2:45 PM Phone: [phone] Fax: [phone]

## 2020-04-15 DIAGNOSIS — M3320 Polymyositis, organ involvement unspecified: Secondary | ICD-10-CM | POA: Diagnosis not present

## 2020-04-26 DIAGNOSIS — L219 Seborrheic dermatitis, unspecified: Secondary | ICD-10-CM | POA: Diagnosis not present

## 2020-04-26 DIAGNOSIS — C44329 Squamous cell carcinoma of skin of other parts of face: Secondary | ICD-10-CM | POA: Diagnosis not present

## 2020-05-27 DIAGNOSIS — L578 Other skin changes due to chronic exposure to nonionizing radiation: Secondary | ICD-10-CM | POA: Diagnosis not present

## 2020-05-27 DIAGNOSIS — C44519 Basal cell carcinoma of skin of other part of trunk: Secondary | ICD-10-CM | POA: Diagnosis not present

## 2020-05-27 DIAGNOSIS — L821 Other seborrheic keratosis: Secondary | ICD-10-CM | POA: Diagnosis not present

## 2020-05-27 DIAGNOSIS — L570 Actinic keratosis: Secondary | ICD-10-CM | POA: Diagnosis not present

## 2020-06-03 DIAGNOSIS — S52022A Displaced fracture of olecranon process without intraarticular extension of left ulna, initial encounter for closed fracture: Secondary | ICD-10-CM | POA: Diagnosis not present

## 2020-06-03 DIAGNOSIS — S42452A Displaced fracture of lateral condyle of left humerus, initial encounter for closed fracture: Secondary | ICD-10-CM | POA: Diagnosis not present

## 2020-06-03 DIAGNOSIS — S32512A Fracture of superior rim of left pubis, initial encounter for closed fracture: Secondary | ICD-10-CM | POA: Diagnosis not present

## 2020-06-03 DIAGNOSIS — W19XXXA Unspecified fall, initial encounter: Secondary | ICD-10-CM | POA: Diagnosis not present

## 2020-06-03 DIAGNOSIS — S32402A Unspecified fracture of left acetabulum, initial encounter for closed fracture: Secondary | ICD-10-CM | POA: Diagnosis not present

## 2020-06-03 DIAGNOSIS — R52 Pain, unspecified: Secondary | ICD-10-CM | POA: Diagnosis not present

## 2020-06-03 DIAGNOSIS — S32412A Displaced fracture of anterior wall of left acetabulum, initial encounter for closed fracture: Secondary | ICD-10-CM | POA: Diagnosis not present

## 2020-06-03 DIAGNOSIS — S32592A Other specified fracture of left pubis, initial encounter for closed fracture: Secondary | ICD-10-CM | POA: Diagnosis not present

## 2020-06-03 DIAGNOSIS — S299XXA Unspecified injury of thorax, initial encounter: Secondary | ICD-10-CM | POA: Diagnosis not present

## 2020-06-03 DIAGNOSIS — S0990XA Unspecified injury of head, initial encounter: Secondary | ICD-10-CM | POA: Diagnosis not present

## 2020-06-03 DIAGNOSIS — S199XXA Unspecified injury of neck, initial encounter: Secondary | ICD-10-CM | POA: Diagnosis not present

## 2020-06-03 DIAGNOSIS — S52025A Nondisplaced fracture of olecranon process without intraarticular extension of left ulna, initial encounter for closed fracture: Secondary | ICD-10-CM | POA: Diagnosis not present

## 2020-06-03 DIAGNOSIS — Z043 Encounter for examination and observation following other accident: Secondary | ICD-10-CM | POA: Diagnosis not present

## 2020-06-04 DIAGNOSIS — S0990XA Unspecified injury of head, initial encounter: Secondary | ICD-10-CM | POA: Diagnosis not present

## 2020-06-04 DIAGNOSIS — S42452A Displaced fracture of lateral condyle of left humerus, initial encounter for closed fracture: Secondary | ICD-10-CM | POA: Diagnosis not present

## 2020-06-04 DIAGNOSIS — Z4789 Encounter for other orthopedic aftercare: Secondary | ICD-10-CM | POA: Diagnosis not present

## 2020-06-04 DIAGNOSIS — M3320 Polymyositis, organ involvement unspecified: Secondary | ICD-10-CM | POA: Diagnosis not present

## 2020-06-04 DIAGNOSIS — M1990 Unspecified osteoarthritis, unspecified site: Secondary | ICD-10-CM | POA: Diagnosis not present

## 2020-06-04 DIAGNOSIS — S32512A Fracture of superior rim of left pubis, initial encounter for closed fracture: Secondary | ICD-10-CM | POA: Diagnosis not present

## 2020-06-04 DIAGNOSIS — S52023A Displaced fracture of olecranon process without intraarticular extension of unspecified ulna, initial encounter for closed fracture: Secondary | ICD-10-CM | POA: Diagnosis not present

## 2020-06-04 DIAGNOSIS — S32402D Unspecified fracture of left acetabulum, subsequent encounter for fracture with routine healing: Secondary | ICD-10-CM | POA: Diagnosis not present

## 2020-06-04 DIAGNOSIS — G20 Parkinson's disease: Secondary | ICD-10-CM | POA: Diagnosis not present

## 2020-06-04 DIAGNOSIS — S52032A Displaced fracture of olecranon process with intraarticular extension of left ulna, initial encounter for closed fracture: Secondary | ICD-10-CM | POA: Diagnosis not present

## 2020-06-04 DIAGNOSIS — S32592A Other specified fracture of left pubis, initial encounter for closed fracture: Secondary | ICD-10-CM | POA: Diagnosis not present

## 2020-06-04 DIAGNOSIS — R278 Other lack of coordination: Secondary | ICD-10-CM | POA: Diagnosis not present

## 2020-06-04 DIAGNOSIS — Z043 Encounter for examination and observation following other accident: Secondary | ICD-10-CM | POA: Diagnosis not present

## 2020-06-04 DIAGNOSIS — W19XXXA Unspecified fall, initial encounter: Secondary | ICD-10-CM | POA: Diagnosis not present

## 2020-06-04 DIAGNOSIS — S52025A Nondisplaced fracture of olecranon process without intraarticular extension of left ulna, initial encounter for closed fracture: Secondary | ICD-10-CM | POA: Diagnosis not present

## 2020-06-04 DIAGNOSIS — E46 Unspecified protein-calorie malnutrition: Secondary | ICD-10-CM | POA: Diagnosis not present

## 2020-06-04 DIAGNOSIS — R2689 Other abnormalities of gait and mobility: Secondary | ICD-10-CM | POA: Diagnosis not present

## 2020-06-04 DIAGNOSIS — R1310 Dysphagia, unspecified: Secondary | ICD-10-CM | POA: Diagnosis not present

## 2020-06-04 DIAGNOSIS — Z7401 Bed confinement status: Secondary | ICD-10-CM | POA: Diagnosis not present

## 2020-06-04 DIAGNOSIS — M6281 Muscle weakness (generalized): Secondary | ICD-10-CM | POA: Diagnosis not present

## 2020-06-04 DIAGNOSIS — K219 Gastro-esophageal reflux disease without esophagitis: Secondary | ICD-10-CM | POA: Diagnosis not present

## 2020-06-04 DIAGNOSIS — Z79899 Other long term (current) drug therapy: Secondary | ICD-10-CM | POA: Diagnosis not present

## 2020-06-04 DIAGNOSIS — S32412A Displaced fracture of anterior wall of left acetabulum, initial encounter for closed fracture: Secondary | ICD-10-CM | POA: Diagnosis not present

## 2020-06-04 DIAGNOSIS — S52023D Displaced fracture of olecranon process without intraarticular extension of unspecified ulna, subsequent encounter for closed fracture with routine healing: Secondary | ICD-10-CM | POA: Diagnosis not present

## 2020-06-04 DIAGNOSIS — S3289XA Fracture of other parts of pelvis, initial encounter for closed fracture: Secondary | ICD-10-CM | POA: Diagnosis not present

## 2020-06-04 DIAGNOSIS — S52022A Displaced fracture of olecranon process without intraarticular extension of left ulna, initial encounter for closed fracture: Secondary | ICD-10-CM | POA: Diagnosis not present

## 2020-06-04 DIAGNOSIS — M2550 Pain in unspecified joint: Secondary | ICD-10-CM | POA: Diagnosis not present

## 2020-06-04 DIAGNOSIS — S299XXA Unspecified injury of thorax, initial encounter: Secondary | ICD-10-CM | POA: Diagnosis not present

## 2020-06-04 DIAGNOSIS — M25552 Pain in left hip: Secondary | ICD-10-CM | POA: Diagnosis not present

## 2020-06-04 DIAGNOSIS — R0789 Other chest pain: Secondary | ICD-10-CM | POA: Diagnosis not present

## 2020-06-04 DIAGNOSIS — M818 Other osteoporosis without current pathological fracture: Secondary | ICD-10-CM | POA: Diagnosis not present

## 2020-06-04 DIAGNOSIS — S32402A Unspecified fracture of left acetabulum, initial encounter for closed fracture: Secondary | ICD-10-CM | POA: Diagnosis not present

## 2020-06-04 DIAGNOSIS — Z20822 Contact with and (suspected) exposure to covid-19: Secondary | ICD-10-CM | POA: Diagnosis not present

## 2020-06-04 DIAGNOSIS — S199XXA Unspecified injury of neck, initial encounter: Secondary | ICD-10-CM | POA: Diagnosis not present

## 2020-06-07 DIAGNOSIS — R278 Other lack of coordination: Secondary | ICD-10-CM | POA: Diagnosis not present

## 2020-06-07 DIAGNOSIS — Z1152 Encounter for screening for COVID-19: Secondary | ICD-10-CM | POA: Diagnosis not present

## 2020-06-07 DIAGNOSIS — S32402A Unspecified fracture of left acetabulum, initial encounter for closed fracture: Secondary | ICD-10-CM | POA: Diagnosis not present

## 2020-06-07 DIAGNOSIS — Z7982 Long term (current) use of aspirin: Secondary | ICD-10-CM | POA: Diagnosis not present

## 2020-06-07 DIAGNOSIS — S3289XA Fracture of other parts of pelvis, initial encounter for closed fracture: Secondary | ICD-10-CM | POA: Diagnosis not present

## 2020-06-07 DIAGNOSIS — S32592A Other specified fracture of left pubis, initial encounter for closed fracture: Secondary | ICD-10-CM | POA: Diagnosis not present

## 2020-06-07 DIAGNOSIS — K219 Gastro-esophageal reflux disease without esophagitis: Secondary | ICD-10-CM | POA: Diagnosis not present

## 2020-06-07 DIAGNOSIS — M3320 Polymyositis, organ involvement unspecified: Secondary | ICD-10-CM | POA: Diagnosis not present

## 2020-06-07 DIAGNOSIS — R2689 Other abnormalities of gait and mobility: Secondary | ICD-10-CM | POA: Diagnosis not present

## 2020-06-07 DIAGNOSIS — R0789 Other chest pain: Secondary | ICD-10-CM | POA: Diagnosis not present

## 2020-06-07 DIAGNOSIS — S32412A Displaced fracture of anterior wall of left acetabulum, initial encounter for closed fracture: Secondary | ICD-10-CM | POA: Diagnosis not present

## 2020-06-07 DIAGNOSIS — R1310 Dysphagia, unspecified: Secondary | ICD-10-CM | POA: Diagnosis not present

## 2020-06-07 DIAGNOSIS — M1990 Unspecified osteoarthritis, unspecified site: Secondary | ICD-10-CM | POA: Diagnosis not present

## 2020-06-07 DIAGNOSIS — R269 Unspecified abnormalities of gait and mobility: Secondary | ICD-10-CM | POA: Diagnosis not present

## 2020-06-07 DIAGNOSIS — S52025A Nondisplaced fracture of olecranon process without intraarticular extension of left ulna, initial encounter for closed fracture: Secondary | ICD-10-CM | POA: Diagnosis not present

## 2020-06-07 DIAGNOSIS — S52022A Displaced fracture of olecranon process without intraarticular extension of left ulna, initial encounter for closed fracture: Secondary | ICD-10-CM | POA: Diagnosis not present

## 2020-06-07 DIAGNOSIS — Z4789 Encounter for other orthopedic aftercare: Secondary | ICD-10-CM | POA: Diagnosis not present

## 2020-06-07 DIAGNOSIS — S32402D Unspecified fracture of left acetabulum, subsequent encounter for fracture with routine healing: Secondary | ICD-10-CM | POA: Diagnosis not present

## 2020-06-07 DIAGNOSIS — S52022P Displaced fracture of olecranon process without intraarticular extension of left ulna, subsequent encounter for closed fracture with malunion: Secondary | ICD-10-CM | POA: Diagnosis not present

## 2020-06-07 DIAGNOSIS — Z79899 Other long term (current) drug therapy: Secondary | ICD-10-CM | POA: Diagnosis not present

## 2020-06-07 DIAGNOSIS — E46 Unspecified protein-calorie malnutrition: Secondary | ICD-10-CM | POA: Diagnosis not present

## 2020-06-07 DIAGNOSIS — Z7401 Bed confinement status: Secondary | ICD-10-CM | POA: Diagnosis not present

## 2020-06-07 DIAGNOSIS — G20 Parkinson's disease: Secondary | ICD-10-CM | POA: Diagnosis not present

## 2020-06-07 DIAGNOSIS — M2550 Pain in unspecified joint: Secondary | ICD-10-CM | POA: Diagnosis not present

## 2020-06-07 DIAGNOSIS — G8918 Other acute postprocedural pain: Secondary | ICD-10-CM | POA: Diagnosis not present

## 2020-06-07 DIAGNOSIS — M6281 Muscle weakness (generalized): Secondary | ICD-10-CM | POA: Diagnosis not present

## 2020-06-07 DIAGNOSIS — M818 Other osteoporosis without current pathological fracture: Secondary | ICD-10-CM | POA: Diagnosis not present

## 2020-06-07 DIAGNOSIS — Z1159 Encounter for screening for other viral diseases: Secondary | ICD-10-CM | POA: Diagnosis not present

## 2020-06-07 DIAGNOSIS — S52023D Displaced fracture of olecranon process without intraarticular extension of unspecified ulna, subsequent encounter for closed fracture with routine healing: Secondary | ICD-10-CM | POA: Diagnosis not present

## 2020-06-07 DIAGNOSIS — T84191A Other mechanical complication of internal fixation device of left humerus, initial encounter: Secondary | ICD-10-CM | POA: Diagnosis not present

## 2020-06-20 DIAGNOSIS — S32402A Unspecified fracture of left acetabulum, initial encounter for closed fracture: Secondary | ICD-10-CM | POA: Diagnosis not present

## 2020-06-20 DIAGNOSIS — S52022A Displaced fracture of olecranon process without intraarticular extension of left ulna, initial encounter for closed fracture: Secondary | ICD-10-CM | POA: Diagnosis not present

## 2020-06-20 DIAGNOSIS — Z1152 Encounter for screening for COVID-19: Secondary | ICD-10-CM | POA: Diagnosis not present

## 2020-06-26 ENCOUNTER — Other Ambulatory Visit: Payer: Self-pay | Admitting: *Deleted

## 2020-06-26 DIAGNOSIS — Z7982 Long term (current) use of aspirin: Secondary | ICD-10-CM | POA: Diagnosis not present

## 2020-06-26 DIAGNOSIS — Z79899 Other long term (current) drug therapy: Secondary | ICD-10-CM | POA: Diagnosis not present

## 2020-06-26 DIAGNOSIS — M3320 Polymyositis, organ involvement unspecified: Secondary | ICD-10-CM | POA: Diagnosis not present

## 2020-06-26 DIAGNOSIS — M1990 Unspecified osteoarthritis, unspecified site: Secondary | ICD-10-CM | POA: Diagnosis not present

## 2020-06-26 DIAGNOSIS — S52022P Displaced fracture of olecranon process without intraarticular extension of left ulna, subsequent encounter for closed fracture with malunion: Secondary | ICD-10-CM | POA: Diagnosis not present

## 2020-06-26 DIAGNOSIS — G8918 Other acute postprocedural pain: Secondary | ICD-10-CM | POA: Diagnosis not present

## 2020-06-26 DIAGNOSIS — S52022A Displaced fracture of olecranon process without intraarticular extension of left ulna, initial encounter for closed fracture: Secondary | ICD-10-CM | POA: Diagnosis not present

## 2020-06-26 DIAGNOSIS — G20 Parkinson's disease: Secondary | ICD-10-CM | POA: Diagnosis not present

## 2020-06-26 DIAGNOSIS — T84191A Other mechanical complication of internal fixation device of left humerus, initial encounter: Secondary | ICD-10-CM | POA: Diagnosis not present

## 2020-06-26 NOTE — Patient Outreach (Signed)
Member screened for potential [facility] Care Management needs.  Per Patient [facility]) member resides in [facility].   Communication sent to [facility] to inquire about transition plans and potential [facility] Care Management needs.   Marthenia Rolling, MSN, RN,BSN [place] Acute Care Coordinator [phone] [facility] [phone]  (Toll free office)

## 2020-07-05 ENCOUNTER — Other Ambulatory Visit: Payer: Self-pay | Admitting: *Deleted

## 2020-07-05 NOTE — Patient Outreach (Signed)
[place] Coordinator follow up.   Alexander Thomas is receiving skilled therapy at [place].   Update received from [place] indicating member will transition home with wife. Transition date not set as of yet. Likely in a week or two. SW endorses member could benefit from [place] Management services.   Will plan outreach accordingly to discuss [place] Management services.   Marthenia Rolling, MSN, RN,BSN [place] Acute Care Coordinator [phone] [facility]) [phone]  (Toll free office)

## 2020-07-12 ENCOUNTER — Other Ambulatory Visit: Payer: Self-pay | Admitting: *Deleted

## 2020-07-12 NOTE — Patient Outreach (Signed)
[facility] Post- Acute Care Coordinator follow up. Member screened for potential [facility] Care Management needs.  Per [place] ( Patient Alexander Thomas) member continues to reside in [facility].   Communication sent to facility SW to inquire about transition date. Transition plan has been to return home with spouse.   Will plan outreach closer to SNF transition.   Marthenia Rolling, MSN, RN,BSN [place] Acute Care Coordinator [phone] [facility]) [phone]  (Toll free office)

## 2020-07-15 ENCOUNTER — Telehealth: Payer: Self-pay | Admitting: Neurology

## 2020-07-15 NOTE — Telephone Encounter (Signed)
I spoke to the patient's wife who was actually visiting with the patient at [facility] when I called (pt was on speaker phone too).  He is there short-term for rehab from his injuries.  His last visit was on 03/26/21 and his Sinemet 25-100mg  was increased to one tablet, four times daily. He remembers this dosage caused him to have low blood pressures previously. He has been running in the 80's/50's intermittently.   Since being at [facility], his Sinemet 25-100mg  has been cut down to one tablet twice daily to see if his BP would improve. He says his pressure has been steady since the decrease. His tremors are worse. However, he felt they were still an issue even at the higher dose.   The resident MD suggested they call here for further advice on medication adjustment for his Parkinson's.

## 2020-07-15 NOTE — Telephone Encounter (Signed)
Pt's wife called stating that after his fall he had in Feb. 13th which he fractured his hip and broke his elbow he has been staying at [facility] where he has been receiving therapy. She states that they have noticed his BP has been quite low and she is needing to discuss his  carbidopa-levodopa (SINEMET IR) 25-100 MG tablet and possible medication options and change. Please advise.

## 2020-07-16 DIAGNOSIS — S52022A Displaced fracture of olecranon process without intraarticular extension of left ulna, initial encounter for closed fracture: Secondary | ICD-10-CM | POA: Diagnosis not present

## 2020-07-16 NOTE — Telephone Encounter (Signed)
I spoke to the patient's wife. I tried to help her get the patient's mychart account set up so we can complete a virtual visit. She felt overwhelmed and would prefer to bring him into the office for an evaluation. She is expecting him to be home from rehab at the end of this week or the beginning of the next week. She scheduled an appt on 07/30/20. She will call me back if she has further concerns prior to this date.

## 2020-07-16 NOTE — Telephone Encounter (Signed)
I have talked with his wife, patient fell, with elbow fracture, has stayed in Nursing facility since February, also suffered multiple pelvic fracture, was noted to have significant orthostatic hypotension, was started on midodrine, Sinemet dosage was decreased from 25/100 mg 4 times a day to twice a day,  He still have significant bilateral hands tremor, to the point that the surgical screw was displaced  Please call his wife and also [place] home if needed to set up virtual visit time

## 2020-07-25 DIAGNOSIS — S32402A Unspecified fracture of left acetabulum, initial encounter for closed fracture: Secondary | ICD-10-CM | POA: Diagnosis not present

## 2020-07-30 ENCOUNTER — Other Ambulatory Visit: Payer: Self-pay | Admitting: *Deleted

## 2020-07-30 ENCOUNTER — Ambulatory Visit (INDEPENDENT_AMBULATORY_CARE_PROVIDER_SITE_OTHER): Payer: Medicare Other | Admitting: Neurology

## 2020-07-30 ENCOUNTER — Encounter: Payer: Self-pay | Admitting: Neurology

## 2020-07-30 VITALS — BP 119/73 | HR 79

## 2020-07-30 DIAGNOSIS — M3320 Polymyositis, organ involvement unspecified: Secondary | ICD-10-CM | POA: Diagnosis not present

## 2020-07-30 DIAGNOSIS — G20A1 Parkinson's disease without dyskinesia, without mention of fluctuations: Secondary | ICD-10-CM

## 2020-07-30 DIAGNOSIS — R269 Unspecified abnormalities of gait and mobility: Secondary | ICD-10-CM | POA: Diagnosis not present

## 2020-07-30 DIAGNOSIS — G20 Parkinson's disease: Secondary | ICD-10-CM

## 2020-07-30 NOTE — Progress Notes (Signed)
ASSESSMENT AND PLAN 75 y.o. year old male  Idiopathic Parkinson's disease  Continue Sinemet 25/100 mg twice a day, if he feel worsening bradykinesia, rigidity, may consider higher dose up to 4 times a day.  Continue Azilect 1 mg daily  Could not tolerate dopamine agonist Requip in the past, complains worsening memory loss,  History of polymyositis  Doing well, there was no muscle weakness noted, continue Imuran 50 mg twice a day   Postural orthostatic tachycardia  He has significant increase of heart rate from baseline 80 to 96 when getting up from seated position, blood pressure 122/75 to 115/64, mildly symptomatic, which likely explain his fall,  Emphasized importance of increased water intake  [place] on June 02, 2020, with left elbow fracture, pelvic fracture  He has constant left arm shaking to the point dislocate the surgical screw that was placed at his left elbow, he reported no significant improvement in his limb shaking even with higher dose of Sinemet  25/100 mg up to 4 times a day, will keep current dose of twice a day, may consider higher dose if he notices increased rigidity, bradykinesia or gait abnormality as he become more active   DIAGNOSTIC DATA (LABS, IMAGING, TESTING) - I reviewed patient records, labs, notes, testing and imaging myself where available.   PHYSICAL EXAM  Vitals:   07/30/20 1224  BP: 119/73  Pulse: 79   There is no height or weight on file to calculate BMI.  PHYSICAL EXAMNIATION:  Gen: NAD, conversant, well nourised, well groomed       NEUROLOGICAL EXAM:  MENTAL STATUS: Speech/Cognition: Microphonias, wet soft voice  MMSE - Mini Mental State Exam 03/26/2020  Orientation to time 5  Orientation to Place 5  Registration 3  Attention/ Calculation 5  Recall 2  Language- name 2 objects 2  Language- repeat 1  Language- follow 3 step command 3  Language- read & follow direction 1  Write a sentence 1  Copy design 1  Total score 29    CRANIAL NERVES: CN II: Visual fields are full to confrontation.  Pupils are round equal and briskly reactive to light. CN III, IV, VI: extraocular movement are normal. No ptosis. CN V: Facial sensation is intact to light touch. CN VII: Face is symmetric with normal eye closure and smile. CN VIII: Hearing is normal to casual conversation CN IX, X: Palate elevates symmetrically. Phonation is normal. CN XI: Head turning and shoulder shrug are intact    MOTOR: He has constant left upper extremity resting tremor which is also in the left elbow brace, no weakness, left more than right limb rigidity, bradykinesia  REFLEXES: Reflexes are 1  and symmetric at the biceps, triceps, knees and ankles. Plantar responses are flexor.  SENSORY: Intact to light touch, pinprick, positional and vibratory sensation at fingers and toes.  COORDINATION: There is no trunk or limb ataxia.    GAIT/STANCE: He needs help to get up from seated position, small shuffling gait  HISTORY OF PRESENT ILLNESS: Alexander Thomas is a 75 years old right-handed male right-handed male, accompanied by his wife, seen in refer by his primary care physician Dr. Jani Gravel for evaluation of right hand tremor, Initial evaluation was in August 2016  I have reviewed and summarized his most recent office note  He had a past medical history of hyperlipidemia, Raynaud's disease,, had left carpal tunnel release surgery, right wrist fracture surgery, polymyositis,  left deltoid muscle biopsy consistent with fiber atrophy, perivasculitis in August 2002, at  its worst, he had difficulty standing up, need his wife help him dressing, he has taken methotrexate along with low-dose prednisone for many years, in 2015, he was switched to Imuran 50 mg twice a day, most recent CPK level was 83, he denies significant muscle weakness now  I also reviewed laboratory results in July 2016, normal CMP, CBC, CPK 83, mild elevated LDL 134, normal ESR 6, hepatitis panel  was negative  He is a retired Company secretary, since 2014, he noticed mild right hand tremor, getting worse gradually, there was no significant gait difficulty, mild lack of facial expression, he has lack of smell, which has been a chronic issues, he denies REM sleep disorder, mild constipations.  Update February 04 2015: He was diagnosed with mild Parkinson's disease, was started on Azilect since August 2016, does not notice any symptomatic improvement either,  MRI of the brain without contrast in August 2016, mild supratentorium small vessel disease, no acute lesions.  UPDATE May 07 2015: He was started on requip xr titrating dose since October 2016, He still has mild right hand shaking, left hand is less, he is now taking Azilect 1 mg every day, requip xr 2 mg 2 tablets every night at 8pm.  He did not noticed any significant side effect.  It does help his tremor  He denies muscle weakness or muscle pain, he had a history of inflammatory polymyositis  UPDATE July 17th 2017: He is taking Azilect 50m qday, he tolerated requip xL 418m2 tabs po qhs well, he still has tremor, he sleeps well, he lost sense of smell, he has no significant gait abnormality, he has ok appetite, he exercise regularly,  5-10 mintues sit ups at that time, he denies obsessive compulsive behavior.  Update August 10 2016: Parkinson's Disease,  He is now taking ropinirole XL 4 mg 4 tablets every night, Azilect 1 mg every morning, there was no significant fluctuation noticed, he ambulate without much difficulty, does has intermittent right hand resting tremor,  Inflammatory polymyositis Imuran 50 mg 2 tablets every day He is doing well, still has significant right hand tremor,   UPDATE November 16 2017: He feels fine, continue to pastor a smaller church, taking Requip xl 70m85mtabs qhs, azilect 1mg8mily, imuran 50mg470mabs every day, he denies significant muscle weakness, stated he can walk better, but has mild increased  drooling, he denies significant memory loss,  UPDATE Feb 6th 2020: He is accompanied by his wife at today's clinical visit, missing his medication frequently, slow worsening memory loss, Today's [place] examination is 23 out of 30 no significant muscle weakness, He still passed her small church of elderly attendants  UPDATE Aug 29 2019: He has constant right hand tremor, 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, was noted by his wife has increased gait abnormality, mild memory loss, MoCA examination is 21/30 today  He also complains of significant low back pain, stay at midline, unbearable sometimes, especially after prolonged standing or walking  Sleep study in October 2020 showed no significant obstructive sleep apnea, he has frequent nocturia, wake up 3-4 times each night, usually able to go back to sleep without much difficulty  Update March 26, 2020 Wife concerned about his weight loss, 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  Update July 30, 2020 He fell  broke his left elbow require surgery on June 02, 2020, also suffered a left pelvic fracture, now just began to weightbearing, he stayed at nursing home since then, he tried to limit his water intake worried about going to the bathroom  He was noted to have orthostatic dizziness, hypotension, today he did not have significant blood pressure drop getting up from seated position, but there was significant orthostatic tachycardia  Blood pressure sitting down 122/75/80; standing up 115/64, heart rate of 96; standing up for 1 minute 122/72, heart rate of 96; standing up to 3 minutes 128/77, heart rate of 103, is mildly symptomatic,  REVIEW OF SYSTEMS: Out of a complete 14 system review of symptoms, the patient complains only of the following  symptoms, and all other reviewed systems are negative.  Tremor, apnea  ALLERGIES: No Known Allergies  HOME MEDICATIONS: Outpatient Medications Prior to Visit  Medication Sig Dispense Refill  . azaTHIOprine (IMURAN) 50 MG tablet Take 1 tablet (50 mg total) by mouth in the morning and at bedtime. 180 tablet 4  . carbidopa-levodopa (SINEMET IR) 25-100 MG tablet Take 1 tablet by mouth 4 (four) times daily. 7am, 11am, 15pm, 19pm. 120 tablet 11  . COD LIVER OIL PO Take by mouth.    . doxycycline (VIBRAMYCIN) 100 MG capsule as needed.   3  . gabapentin (NEURONTIN) 300 MG capsule as needed.     . magnesium hydroxide (MILK OF MAGNESIA) 400 MG/5ML suspension Take 5 mLs by mouth as needed for mild constipation.    . rasagiline (AZILECT) 1 MG TABS tablet TAKE 1 TABLET BY MOUTH EVERY DAY 90 tablet 4  . sildenafil (VIAGRA) 50 MG tablet Take 50 mg by mouth as needed for erectile dysfunction.    . terbinafine (LAMISIL) 250 MG tablet Take 250 mg by mouth daily.     No facility-administered medications prior to visit.    PAST MEDICAL HISTORY: Past Medical History:  Diagnosis Date  . Hypercholesteremia   . Migraine   . Polymyositis ([place])   . Raynaud disease   . Tremor of right hand     PAST SURGICAL HISTORY: Past Surgical History:  Procedure Laterality Date  . CARPAL TUNNEL RELEASE Left   . INGUINAL HERNIA REPAIR Bilateral   . WRIST SURGERY Right     FAMILY HISTORY: Family History  Problem Relation Age of Onset  . Uterine cancer Mother   . Healthy Father   . Kidney cancer Brother   . Colon cancer Brother     SOCIAL HISTORY: Social History   Socioeconomic History  . Marital status: Married    Spouse name: Not on file  . Number of children: 2  . Years of education: PhD  . Highest education level: Not on file  Occupational History  . Occupation: Company secretary  Tobacco Use  . Smoking status: Never Smoker  . Smokeless tobacco: Never Used  Substance and Sexual Activity  . Alcohol  use: No    Alcohol/week: 0.0 standard drinks  . Drug use: No  . Sexual activity: Not on file  Other Topics Concern  . Not on file  Social History Narrative   Lives at home with his wife.   Right-handed.   1 cup caffeine daily.   Social Determinants of Health   Financial Resource Strain: Not on file  Food Insecurity: Not on file  Transportation Needs: Not on file  Physical Activity: Not on file  Stress: Not on file  Social Connections: Not on file  Intimate Partner Violence:  Not on file       Alexander Thomas, M.D. Ph.D.  [facility] [place], [place] [address] Phone: [phone] Fax:      [phone]

## 2020-07-30 NOTE — Patient Outreach (Signed)
[place] Coordinator follow up. Member screened for potential [facility] Care Management needs.  Per [place] (Patient Alexander Thomas) member resides in [facility]. Communication sent to facility SW to inquire about updated transition plans and date.  Will plan outreach to discuss [place] Management services as appropriate.  Marthenia Rolling, MSN, RN,BSN [place] Acute Care Coordinator [phone] [facility]) [phone]  (Toll free office)

## 2020-08-04 DIAGNOSIS — Z7982 Long term (current) use of aspirin: Secondary | ICD-10-CM | POA: Diagnosis not present

## 2020-08-04 DIAGNOSIS — M80052D Age-related osteoporosis with current pathological fracture, left femur, subsequent encounter for fracture with routine healing: Secondary | ICD-10-CM | POA: Diagnosis not present

## 2020-08-04 DIAGNOSIS — K219 Gastro-esophageal reflux disease without esophagitis: Secondary | ICD-10-CM | POA: Diagnosis not present

## 2020-08-04 DIAGNOSIS — Z4789 Encounter for other orthopedic aftercare: Secondary | ICD-10-CM | POA: Diagnosis not present

## 2020-08-04 DIAGNOSIS — M80022D Age-related osteoporosis with current pathological fracture, left humerus, subsequent encounter for fracture with routine healing: Secondary | ICD-10-CM | POA: Diagnosis not present

## 2020-08-04 DIAGNOSIS — M3320 Polymyositis, organ involvement unspecified: Secondary | ICD-10-CM | POA: Diagnosis not present

## 2020-08-04 DIAGNOSIS — D649 Anemia, unspecified: Secondary | ICD-10-CM | POA: Diagnosis not present

## 2020-08-04 DIAGNOSIS — M800AXD Age-related osteoporosis with current pathological fracture, other site, subsequent encounter for fracture with routine healing: Secondary | ICD-10-CM | POA: Diagnosis not present

## 2020-08-04 DIAGNOSIS — E46 Unspecified protein-calorie malnutrition: Secondary | ICD-10-CM | POA: Diagnosis not present

## 2020-08-04 DIAGNOSIS — T84121D Displacement of internal fixation device of left humerus, subsequent encounter: Secondary | ICD-10-CM | POA: Diagnosis not present

## 2020-08-04 DIAGNOSIS — G20 Parkinson's disease: Secondary | ICD-10-CM | POA: Diagnosis not present

## 2020-08-04 DIAGNOSIS — K5900 Constipation, unspecified: Secondary | ICD-10-CM | POA: Diagnosis not present

## 2020-08-04 DIAGNOSIS — R1310 Dysphagia, unspecified: Secondary | ICD-10-CM | POA: Diagnosis not present

## 2020-08-06 ENCOUNTER — Other Ambulatory Visit: Payer: Self-pay | Admitting: *Deleted

## 2020-08-06 DIAGNOSIS — K219 Gastro-esophageal reflux disease without esophagitis: Secondary | ICD-10-CM | POA: Diagnosis not present

## 2020-08-06 DIAGNOSIS — G20 Parkinson's disease: Secondary | ICD-10-CM | POA: Diagnosis not present

## 2020-08-06 DIAGNOSIS — S52022A Displaced fracture of olecranon process without intraarticular extension of left ulna, initial encounter for closed fracture: Secondary | ICD-10-CM | POA: Diagnosis not present

## 2020-08-06 DIAGNOSIS — T84121D Displacement of internal fixation device of left humerus, subsequent encounter: Secondary | ICD-10-CM | POA: Diagnosis not present

## 2020-08-06 DIAGNOSIS — M80052D Age-related osteoporosis with current pathological fracture, left femur, subsequent encounter for fracture with routine healing: Secondary | ICD-10-CM | POA: Diagnosis not present

## 2020-08-06 DIAGNOSIS — M800AXD Age-related osteoporosis with current pathological fracture, other site, subsequent encounter for fracture with routine healing: Secondary | ICD-10-CM | POA: Diagnosis not present

## 2020-08-06 DIAGNOSIS — M80022D Age-related osteoporosis with current pathological fracture, left humerus, subsequent encounter for fracture with routine healing: Secondary | ICD-10-CM | POA: Diagnosis not present

## 2020-08-06 NOTE — Patient Outreach (Signed)
[place] Coordinator follow up. Member screened for potential [facility] Care Management needs.  Verified in [place] (Patient Alexander Thomas) [name] transitioned to home from [facility] on 08/03/20. [place] was arranged per [facility].   Telephone call made to [name] to discuss [place] Management services. However, no answer. HIPAA compliant voicemail message left to request return call.    Marthenia Rolling, MSN, RN,BSN [place] Acute Care Coordinator [phone] 2201 [facility]) [phone]  (Toll free office)

## 2020-08-07 ENCOUNTER — Other Ambulatory Visit: Payer: Self-pay | Admitting: *Deleted

## 2020-08-07 DIAGNOSIS — T84121D Displacement of internal fixation device of left humerus, subsequent encounter: Secondary | ICD-10-CM | POA: Diagnosis not present

## 2020-08-07 DIAGNOSIS — M80052D Age-related osteoporosis with current pathological fracture, left femur, subsequent encounter for fracture with routine healing: Secondary | ICD-10-CM | POA: Diagnosis not present

## 2020-08-07 DIAGNOSIS — K219 Gastro-esophageal reflux disease without esophagitis: Secondary | ICD-10-CM | POA: Diagnosis not present

## 2020-08-07 DIAGNOSIS — M800AXD Age-related osteoporosis with current pathological fracture, other site, subsequent encounter for fracture with routine healing: Secondary | ICD-10-CM | POA: Diagnosis not present

## 2020-08-07 DIAGNOSIS — G20 Parkinson's disease: Secondary | ICD-10-CM | POA: Diagnosis not present

## 2020-08-07 DIAGNOSIS — M80022D Age-related osteoporosis with current pathological fracture, left humerus, subsequent encounter for fracture with routine healing: Secondary | ICD-10-CM | POA: Diagnosis not present

## 2020-08-07 NOTE — Patient Outreach (Signed)
[place] Coordinator follow up. Member screened for potential [facility] Care Management needs.  Mr. Fenech transitioned from [facility] to home on 08/02/20. Telephone call made to Mr. Gallier [phone]. Patient identifiers confirmed.   Mr. Bidinger reports home health has been out twice since he has been home. Has [facility]. Mr. Haisley lives with spouse. Explained [place] Management program. Mr. Mcclinton states "everything is covered." Denies having any [facility] Care Management needs at this time. Discussed importance of follow up PCP visit. Mr. Brow states he will schedule PCP appointment. Expressed appreciation of the call.   Will sign off. No identifiable [facility] Care Management needs at this time.   Marthenia Rolling, MSN, RN,BSN [place] Acute Care Coordinator [phone] [facility]) [phone]  (Toll free office)

## 2020-08-08 DIAGNOSIS — M800AXD Age-related osteoporosis with current pathological fracture, other site, subsequent encounter for fracture with routine healing: Secondary | ICD-10-CM | POA: Diagnosis not present

## 2020-08-08 DIAGNOSIS — M80052D Age-related osteoporosis with current pathological fracture, left femur, subsequent encounter for fracture with routine healing: Secondary | ICD-10-CM | POA: Diagnosis not present

## 2020-08-08 DIAGNOSIS — M80022D Age-related osteoporosis with current pathological fracture, left humerus, subsequent encounter for fracture with routine healing: Secondary | ICD-10-CM | POA: Diagnosis not present

## 2020-08-08 DIAGNOSIS — G20 Parkinson's disease: Secondary | ICD-10-CM | POA: Diagnosis not present

## 2020-08-08 DIAGNOSIS — K219 Gastro-esophageal reflux disease without esophagitis: Secondary | ICD-10-CM | POA: Diagnosis not present

## 2020-08-08 DIAGNOSIS — T84121D Displacement of internal fixation device of left humerus, subsequent encounter: Secondary | ICD-10-CM | POA: Diagnosis not present

## 2020-08-09 DIAGNOSIS — M80022D Age-related osteoporosis with current pathological fracture, left humerus, subsequent encounter for fracture with routine healing: Secondary | ICD-10-CM | POA: Diagnosis not present

## 2020-08-09 DIAGNOSIS — T84121D Displacement of internal fixation device of left humerus, subsequent encounter: Secondary | ICD-10-CM | POA: Diagnosis not present

## 2020-08-09 DIAGNOSIS — M800AXD Age-related osteoporosis with current pathological fracture, other site, subsequent encounter for fracture with routine healing: Secondary | ICD-10-CM | POA: Diagnosis not present

## 2020-08-09 DIAGNOSIS — M80052D Age-related osteoporosis with current pathological fracture, left femur, subsequent encounter for fracture with routine healing: Secondary | ICD-10-CM | POA: Diagnosis not present

## 2020-08-09 DIAGNOSIS — K219 Gastro-esophageal reflux disease without esophagitis: Secondary | ICD-10-CM | POA: Diagnosis not present

## 2020-08-09 DIAGNOSIS — G20 Parkinson's disease: Secondary | ICD-10-CM | POA: Diagnosis not present

## 2020-08-13 DIAGNOSIS — M800AXD Age-related osteoporosis with current pathological fracture, other site, subsequent encounter for fracture with routine healing: Secondary | ICD-10-CM | POA: Diagnosis not present

## 2020-08-13 DIAGNOSIS — G20 Parkinson's disease: Secondary | ICD-10-CM | POA: Diagnosis not present

## 2020-08-13 DIAGNOSIS — T84121D Displacement of internal fixation device of left humerus, subsequent encounter: Secondary | ICD-10-CM | POA: Diagnosis not present

## 2020-08-13 DIAGNOSIS — M80052D Age-related osteoporosis with current pathological fracture, left femur, subsequent encounter for fracture with routine healing: Secondary | ICD-10-CM | POA: Diagnosis not present

## 2020-08-13 DIAGNOSIS — M80022D Age-related osteoporosis with current pathological fracture, left humerus, subsequent encounter for fracture with routine healing: Secondary | ICD-10-CM | POA: Diagnosis not present

## 2020-08-13 DIAGNOSIS — K219 Gastro-esophageal reflux disease without esophagitis: Secondary | ICD-10-CM | POA: Diagnosis not present

## 2020-08-14 DIAGNOSIS — G20 Parkinson's disease: Secondary | ICD-10-CM | POA: Diagnosis not present

## 2020-08-14 DIAGNOSIS — M80022D Age-related osteoporosis with current pathological fracture, left humerus, subsequent encounter for fracture with routine healing: Secondary | ICD-10-CM | POA: Diagnosis not present

## 2020-08-14 DIAGNOSIS — K219 Gastro-esophageal reflux disease without esophagitis: Secondary | ICD-10-CM | POA: Diagnosis not present

## 2020-08-14 DIAGNOSIS — T84121D Displacement of internal fixation device of left humerus, subsequent encounter: Secondary | ICD-10-CM | POA: Diagnosis not present

## 2020-08-14 DIAGNOSIS — M800AXD Age-related osteoporosis with current pathological fracture, other site, subsequent encounter for fracture with routine healing: Secondary | ICD-10-CM | POA: Diagnosis not present

## 2020-08-14 DIAGNOSIS — M80052D Age-related osteoporosis with current pathological fracture, left femur, subsequent encounter for fracture with routine healing: Secondary | ICD-10-CM | POA: Diagnosis not present

## 2020-08-15 DIAGNOSIS — G20 Parkinson's disease: Secondary | ICD-10-CM | POA: Diagnosis not present

## 2020-08-15 DIAGNOSIS — K219 Gastro-esophageal reflux disease without esophagitis: Secondary | ICD-10-CM | POA: Diagnosis not present

## 2020-08-15 DIAGNOSIS — T84121D Displacement of internal fixation device of left humerus, subsequent encounter: Secondary | ICD-10-CM | POA: Diagnosis not present

## 2020-08-15 DIAGNOSIS — M80052D Age-related osteoporosis with current pathological fracture, left femur, subsequent encounter for fracture with routine healing: Secondary | ICD-10-CM | POA: Diagnosis not present

## 2020-08-15 DIAGNOSIS — M800AXD Age-related osteoporosis with current pathological fracture, other site, subsequent encounter for fracture with routine healing: Secondary | ICD-10-CM | POA: Diagnosis not present

## 2020-08-15 DIAGNOSIS — M80022D Age-related osteoporosis with current pathological fracture, left humerus, subsequent encounter for fracture with routine healing: Secondary | ICD-10-CM | POA: Diagnosis not present

## 2020-08-16 DIAGNOSIS — G20 Parkinson's disease: Secondary | ICD-10-CM | POA: Diagnosis not present

## 2020-08-16 DIAGNOSIS — M800AXD Age-related osteoporosis with current pathological fracture, other site, subsequent encounter for fracture with routine healing: Secondary | ICD-10-CM | POA: Diagnosis not present

## 2020-08-16 DIAGNOSIS — T84121D Displacement of internal fixation device of left humerus, subsequent encounter: Secondary | ICD-10-CM | POA: Diagnosis not present

## 2020-08-16 DIAGNOSIS — M80022D Age-related osteoporosis with current pathological fracture, left humerus, subsequent encounter for fracture with routine healing: Secondary | ICD-10-CM | POA: Diagnosis not present

## 2020-08-16 DIAGNOSIS — M80052D Age-related osteoporosis with current pathological fracture, left femur, subsequent encounter for fracture with routine healing: Secondary | ICD-10-CM | POA: Diagnosis not present

## 2020-08-16 DIAGNOSIS — K219 Gastro-esophageal reflux disease without esophagitis: Secondary | ICD-10-CM | POA: Diagnosis not present

## 2020-08-19 DIAGNOSIS — M80052D Age-related osteoporosis with current pathological fracture, left femur, subsequent encounter for fracture with routine healing: Secondary | ICD-10-CM | POA: Diagnosis not present

## 2020-08-19 DIAGNOSIS — M800AXD Age-related osteoporosis with current pathological fracture, other site, subsequent encounter for fracture with routine healing: Secondary | ICD-10-CM | POA: Diagnosis not present

## 2020-08-19 DIAGNOSIS — G20 Parkinson's disease: Secondary | ICD-10-CM | POA: Diagnosis not present

## 2020-08-19 DIAGNOSIS — K219 Gastro-esophageal reflux disease without esophagitis: Secondary | ICD-10-CM | POA: Diagnosis not present

## 2020-08-19 DIAGNOSIS — M80022D Age-related osteoporosis with current pathological fracture, left humerus, subsequent encounter for fracture with routine healing: Secondary | ICD-10-CM | POA: Diagnosis not present

## 2020-08-19 DIAGNOSIS — T84121D Displacement of internal fixation device of left humerus, subsequent encounter: Secondary | ICD-10-CM | POA: Diagnosis not present

## 2020-08-20 DIAGNOSIS — T84121D Displacement of internal fixation device of left humerus, subsequent encounter: Secondary | ICD-10-CM | POA: Diagnosis not present

## 2020-08-20 DIAGNOSIS — M80022D Age-related osteoporosis with current pathological fracture, left humerus, subsequent encounter for fracture with routine healing: Secondary | ICD-10-CM | POA: Diagnosis not present

## 2020-08-20 DIAGNOSIS — K219 Gastro-esophageal reflux disease without esophagitis: Secondary | ICD-10-CM | POA: Diagnosis not present

## 2020-08-20 DIAGNOSIS — M80052D Age-related osteoporosis with current pathological fracture, left femur, subsequent encounter for fracture with routine healing: Secondary | ICD-10-CM | POA: Diagnosis not present

## 2020-08-20 DIAGNOSIS — M800AXD Age-related osteoporosis with current pathological fracture, other site, subsequent encounter for fracture with routine healing: Secondary | ICD-10-CM | POA: Diagnosis not present

## 2020-08-20 DIAGNOSIS — G20 Parkinson's disease: Secondary | ICD-10-CM | POA: Diagnosis not present

## 2020-08-21 DIAGNOSIS — M80022D Age-related osteoporosis with current pathological fracture, left humerus, subsequent encounter for fracture with routine healing: Secondary | ICD-10-CM | POA: Diagnosis not present

## 2020-08-21 DIAGNOSIS — T84121D Displacement of internal fixation device of left humerus, subsequent encounter: Secondary | ICD-10-CM | POA: Diagnosis not present

## 2020-08-21 DIAGNOSIS — K219 Gastro-esophageal reflux disease without esophagitis: Secondary | ICD-10-CM | POA: Diagnosis not present

## 2020-08-21 DIAGNOSIS — M800AXD Age-related osteoporosis with current pathological fracture, other site, subsequent encounter for fracture with routine healing: Secondary | ICD-10-CM | POA: Diagnosis not present

## 2020-08-21 DIAGNOSIS — G20 Parkinson's disease: Secondary | ICD-10-CM | POA: Diagnosis not present

## 2020-08-21 DIAGNOSIS — M80052D Age-related osteoporosis with current pathological fracture, left femur, subsequent encounter for fracture with routine healing: Secondary | ICD-10-CM | POA: Diagnosis not present

## 2020-08-22 DIAGNOSIS — M800AXD Age-related osteoporosis with current pathological fracture, other site, subsequent encounter for fracture with routine healing: Secondary | ICD-10-CM | POA: Diagnosis not present

## 2020-08-22 DIAGNOSIS — M80052D Age-related osteoporosis with current pathological fracture, left femur, subsequent encounter for fracture with routine healing: Secondary | ICD-10-CM | POA: Diagnosis not present

## 2020-08-22 DIAGNOSIS — T84121D Displacement of internal fixation device of left humerus, subsequent encounter: Secondary | ICD-10-CM | POA: Diagnosis not present

## 2020-08-22 DIAGNOSIS — G20 Parkinson's disease: Secondary | ICD-10-CM | POA: Diagnosis not present

## 2020-08-22 DIAGNOSIS — K219 Gastro-esophageal reflux disease without esophagitis: Secondary | ICD-10-CM | POA: Diagnosis not present

## 2020-08-22 DIAGNOSIS — M80022D Age-related osteoporosis with current pathological fracture, left humerus, subsequent encounter for fracture with routine healing: Secondary | ICD-10-CM | POA: Diagnosis not present

## 2020-08-26 DIAGNOSIS — M80022D Age-related osteoporosis with current pathological fracture, left humerus, subsequent encounter for fracture with routine healing: Secondary | ICD-10-CM | POA: Diagnosis not present

## 2020-08-26 DIAGNOSIS — M800AXD Age-related osteoporosis with current pathological fracture, other site, subsequent encounter for fracture with routine healing: Secondary | ICD-10-CM | POA: Diagnosis not present

## 2020-08-26 DIAGNOSIS — G20 Parkinson's disease: Secondary | ICD-10-CM | POA: Diagnosis not present

## 2020-08-26 DIAGNOSIS — T84121D Displacement of internal fixation device of left humerus, subsequent encounter: Secondary | ICD-10-CM | POA: Diagnosis not present

## 2020-08-26 DIAGNOSIS — M80052D Age-related osteoporosis with current pathological fracture, left femur, subsequent encounter for fracture with routine healing: Secondary | ICD-10-CM | POA: Diagnosis not present

## 2020-08-26 DIAGNOSIS — K219 Gastro-esophageal reflux disease without esophagitis: Secondary | ICD-10-CM | POA: Diagnosis not present

## 2020-08-27 DIAGNOSIS — T84121D Displacement of internal fixation device of left humerus, subsequent encounter: Secondary | ICD-10-CM | POA: Diagnosis not present

## 2020-08-27 DIAGNOSIS — K219 Gastro-esophageal reflux disease without esophagitis: Secondary | ICD-10-CM | POA: Diagnosis not present

## 2020-08-27 DIAGNOSIS — G20 Parkinson's disease: Secondary | ICD-10-CM | POA: Diagnosis not present

## 2020-08-27 DIAGNOSIS — M80052D Age-related osteoporosis with current pathological fracture, left femur, subsequent encounter for fracture with routine healing: Secondary | ICD-10-CM | POA: Diagnosis not present

## 2020-08-27 DIAGNOSIS — M80022D Age-related osteoporosis with current pathological fracture, left humerus, subsequent encounter for fracture with routine healing: Secondary | ICD-10-CM | POA: Diagnosis not present

## 2020-08-27 DIAGNOSIS — M800AXD Age-related osteoporosis with current pathological fracture, other site, subsequent encounter for fracture with routine healing: Secondary | ICD-10-CM | POA: Diagnosis not present

## 2020-08-28 DIAGNOSIS — K219 Gastro-esophageal reflux disease without esophagitis: Secondary | ICD-10-CM | POA: Diagnosis not present

## 2020-08-28 DIAGNOSIS — T84121D Displacement of internal fixation device of left humerus, subsequent encounter: Secondary | ICD-10-CM | POA: Diagnosis not present

## 2020-08-28 DIAGNOSIS — M80052D Age-related osteoporosis with current pathological fracture, left femur, subsequent encounter for fracture with routine healing: Secondary | ICD-10-CM | POA: Diagnosis not present

## 2020-08-28 DIAGNOSIS — M80022D Age-related osteoporosis with current pathological fracture, left humerus, subsequent encounter for fracture with routine healing: Secondary | ICD-10-CM | POA: Diagnosis not present

## 2020-08-28 DIAGNOSIS — M800AXD Age-related osteoporosis with current pathological fracture, other site, subsequent encounter for fracture with routine healing: Secondary | ICD-10-CM | POA: Diagnosis not present

## 2020-08-28 DIAGNOSIS — G20 Parkinson's disease: Secondary | ICD-10-CM | POA: Diagnosis not present

## 2020-08-29 DIAGNOSIS — M800AXD Age-related osteoporosis with current pathological fracture, other site, subsequent encounter for fracture with routine healing: Secondary | ICD-10-CM | POA: Diagnosis not present

## 2020-08-29 DIAGNOSIS — G20 Parkinson's disease: Secondary | ICD-10-CM | POA: Diagnosis not present

## 2020-08-29 DIAGNOSIS — M80052D Age-related osteoporosis with current pathological fracture, left femur, subsequent encounter for fracture with routine healing: Secondary | ICD-10-CM | POA: Diagnosis not present

## 2020-08-29 DIAGNOSIS — T84121D Displacement of internal fixation device of left humerus, subsequent encounter: Secondary | ICD-10-CM | POA: Diagnosis not present

## 2020-08-29 DIAGNOSIS — M80022D Age-related osteoporosis with current pathological fracture, left humerus, subsequent encounter for fracture with routine healing: Secondary | ICD-10-CM | POA: Diagnosis not present

## 2020-08-29 DIAGNOSIS — K219 Gastro-esophageal reflux disease without esophagitis: Secondary | ICD-10-CM | POA: Diagnosis not present

## 2020-09-02 DIAGNOSIS — M8589 Other specified disorders of bone density and structure, multiple sites: Secondary | ICD-10-CM | POA: Diagnosis not present

## 2020-09-02 DIAGNOSIS — M3322 Polymyositis with myopathy: Secondary | ICD-10-CM | POA: Diagnosis not present

## 2020-09-02 DIAGNOSIS — Z13820 Encounter for screening for osteoporosis: Secondary | ICD-10-CM | POA: Diagnosis not present

## 2020-09-02 DIAGNOSIS — Z79899 Other long term (current) drug therapy: Secondary | ICD-10-CM | POA: Diagnosis not present

## 2020-09-02 DIAGNOSIS — G20 Parkinson's disease: Secondary | ICD-10-CM | POA: Diagnosis not present

## 2020-09-02 DIAGNOSIS — I7300 Raynaud's syndrome without gangrene: Secondary | ICD-10-CM | POA: Diagnosis not present

## 2020-09-03 DIAGNOSIS — D649 Anemia, unspecified: Secondary | ICD-10-CM | POA: Diagnosis not present

## 2020-09-03 DIAGNOSIS — M80052D Age-related osteoporosis with current pathological fracture, left femur, subsequent encounter for fracture with routine healing: Secondary | ICD-10-CM | POA: Diagnosis not present

## 2020-09-03 DIAGNOSIS — K219 Gastro-esophageal reflux disease without esophagitis: Secondary | ICD-10-CM | POA: Diagnosis not present

## 2020-09-03 DIAGNOSIS — E46 Unspecified protein-calorie malnutrition: Secondary | ICD-10-CM | POA: Diagnosis not present

## 2020-09-03 DIAGNOSIS — Z7982 Long term (current) use of aspirin: Secondary | ICD-10-CM | POA: Diagnosis not present

## 2020-09-03 DIAGNOSIS — G20 Parkinson's disease: Secondary | ICD-10-CM | POA: Diagnosis not present

## 2020-09-03 DIAGNOSIS — T84121D Displacement of internal fixation device of left humerus, subsequent encounter: Secondary | ICD-10-CM | POA: Diagnosis not present

## 2020-09-03 DIAGNOSIS — M800AXD Age-related osteoporosis with current pathological fracture, other site, subsequent encounter for fracture with routine healing: Secondary | ICD-10-CM | POA: Diagnosis not present

## 2020-09-03 DIAGNOSIS — R1310 Dysphagia, unspecified: Secondary | ICD-10-CM | POA: Diagnosis not present

## 2020-09-03 DIAGNOSIS — M3320 Polymyositis, organ involvement unspecified: Secondary | ICD-10-CM | POA: Diagnosis not present

## 2020-09-03 DIAGNOSIS — M80022D Age-related osteoporosis with current pathological fracture, left humerus, subsequent encounter for fracture with routine healing: Secondary | ICD-10-CM | POA: Diagnosis not present

## 2020-09-03 DIAGNOSIS — K5900 Constipation, unspecified: Secondary | ICD-10-CM | POA: Diagnosis not present

## 2020-09-04 DIAGNOSIS — K219 Gastro-esophageal reflux disease without esophagitis: Secondary | ICD-10-CM | POA: Diagnosis not present

## 2020-09-04 DIAGNOSIS — G20 Parkinson's disease: Secondary | ICD-10-CM | POA: Diagnosis not present

## 2020-09-04 DIAGNOSIS — M80052D Age-related osteoporosis with current pathological fracture, left femur, subsequent encounter for fracture with routine healing: Secondary | ICD-10-CM | POA: Diagnosis not present

## 2020-09-04 DIAGNOSIS — M800AXD Age-related osteoporosis with current pathological fracture, other site, subsequent encounter for fracture with routine healing: Secondary | ICD-10-CM | POA: Diagnosis not present

## 2020-09-04 DIAGNOSIS — T84121D Displacement of internal fixation device of left humerus, subsequent encounter: Secondary | ICD-10-CM | POA: Diagnosis not present

## 2020-09-04 DIAGNOSIS — M80022D Age-related osteoporosis with current pathological fracture, left humerus, subsequent encounter for fracture with routine healing: Secondary | ICD-10-CM | POA: Diagnosis not present

## 2020-09-05 DIAGNOSIS — M80022D Age-related osteoporosis with current pathological fracture, left humerus, subsequent encounter for fracture with routine healing: Secondary | ICD-10-CM | POA: Diagnosis not present

## 2020-09-05 DIAGNOSIS — M800AXD Age-related osteoporosis with current pathological fracture, other site, subsequent encounter for fracture with routine healing: Secondary | ICD-10-CM | POA: Diagnosis not present

## 2020-09-05 DIAGNOSIS — G20 Parkinson's disease: Secondary | ICD-10-CM | POA: Diagnosis not present

## 2020-09-05 DIAGNOSIS — T84121D Displacement of internal fixation device of left humerus, subsequent encounter: Secondary | ICD-10-CM | POA: Diagnosis not present

## 2020-09-05 DIAGNOSIS — M80052D Age-related osteoporosis with current pathological fracture, left femur, subsequent encounter for fracture with routine healing: Secondary | ICD-10-CM | POA: Diagnosis not present

## 2020-09-05 DIAGNOSIS — K219 Gastro-esophageal reflux disease without esophagitis: Secondary | ICD-10-CM | POA: Diagnosis not present

## 2020-09-10 DIAGNOSIS — K219 Gastro-esophageal reflux disease without esophagitis: Secondary | ICD-10-CM | POA: Diagnosis not present

## 2020-09-10 DIAGNOSIS — M80052D Age-related osteoporosis with current pathological fracture, left femur, subsequent encounter for fracture with routine healing: Secondary | ICD-10-CM | POA: Diagnosis not present

## 2020-09-10 DIAGNOSIS — T84121D Displacement of internal fixation device of left humerus, subsequent encounter: Secondary | ICD-10-CM | POA: Diagnosis not present

## 2020-09-10 DIAGNOSIS — G20 Parkinson's disease: Secondary | ICD-10-CM | POA: Diagnosis not present

## 2020-09-10 DIAGNOSIS — M800AXD Age-related osteoporosis with current pathological fracture, other site, subsequent encounter for fracture with routine healing: Secondary | ICD-10-CM | POA: Diagnosis not present

## 2020-09-10 DIAGNOSIS — M80022D Age-related osteoporosis with current pathological fracture, left humerus, subsequent encounter for fracture with routine healing: Secondary | ICD-10-CM | POA: Diagnosis not present

## 2020-09-11 DIAGNOSIS — M800AXD Age-related osteoporosis with current pathological fracture, other site, subsequent encounter for fracture with routine healing: Secondary | ICD-10-CM | POA: Diagnosis not present

## 2020-09-11 DIAGNOSIS — G20 Parkinson's disease: Secondary | ICD-10-CM | POA: Diagnosis not present

## 2020-09-11 DIAGNOSIS — K219 Gastro-esophageal reflux disease without esophagitis: Secondary | ICD-10-CM | POA: Diagnosis not present

## 2020-09-11 DIAGNOSIS — M80052D Age-related osteoporosis with current pathological fracture, left femur, subsequent encounter for fracture with routine healing: Secondary | ICD-10-CM | POA: Diagnosis not present

## 2020-09-11 DIAGNOSIS — T84121D Displacement of internal fixation device of left humerus, subsequent encounter: Secondary | ICD-10-CM | POA: Diagnosis not present

## 2020-09-11 DIAGNOSIS — M80022D Age-related osteoporosis with current pathological fracture, left humerus, subsequent encounter for fracture with routine healing: Secondary | ICD-10-CM | POA: Diagnosis not present

## 2020-09-18 DIAGNOSIS — G20 Parkinson's disease: Secondary | ICD-10-CM | POA: Diagnosis not present

## 2020-09-18 DIAGNOSIS — M80052D Age-related osteoporosis with current pathological fracture, left femur, subsequent encounter for fracture with routine healing: Secondary | ICD-10-CM | POA: Diagnosis not present

## 2020-09-18 DIAGNOSIS — K219 Gastro-esophageal reflux disease without esophagitis: Secondary | ICD-10-CM | POA: Diagnosis not present

## 2020-09-18 DIAGNOSIS — M80022D Age-related osteoporosis with current pathological fracture, left humerus, subsequent encounter for fracture with routine healing: Secondary | ICD-10-CM | POA: Diagnosis not present

## 2020-09-18 DIAGNOSIS — M800AXD Age-related osteoporosis with current pathological fracture, other site, subsequent encounter for fracture with routine healing: Secondary | ICD-10-CM | POA: Diagnosis not present

## 2020-09-18 DIAGNOSIS — T84121D Displacement of internal fixation device of left humerus, subsequent encounter: Secondary | ICD-10-CM | POA: Diagnosis not present

## 2020-09-19 DIAGNOSIS — M800AXD Age-related osteoporosis with current pathological fracture, other site, subsequent encounter for fracture with routine healing: Secondary | ICD-10-CM | POA: Diagnosis not present

## 2020-09-19 DIAGNOSIS — G20 Parkinson's disease: Secondary | ICD-10-CM | POA: Diagnosis not present

## 2020-09-19 DIAGNOSIS — M80022D Age-related osteoporosis with current pathological fracture, left humerus, subsequent encounter for fracture with routine healing: Secondary | ICD-10-CM | POA: Diagnosis not present

## 2020-09-19 DIAGNOSIS — K219 Gastro-esophageal reflux disease without esophagitis: Secondary | ICD-10-CM | POA: Diagnosis not present

## 2020-09-19 DIAGNOSIS — M80052D Age-related osteoporosis with current pathological fracture, left femur, subsequent encounter for fracture with routine healing: Secondary | ICD-10-CM | POA: Diagnosis not present

## 2020-09-19 DIAGNOSIS — T84121D Displacement of internal fixation device of left humerus, subsequent encounter: Secondary | ICD-10-CM | POA: Diagnosis not present

## 2020-09-23 DIAGNOSIS — T84121D Displacement of internal fixation device of left humerus, subsequent encounter: Secondary | ICD-10-CM | POA: Diagnosis not present

## 2020-09-23 DIAGNOSIS — M80052D Age-related osteoporosis with current pathological fracture, left femur, subsequent encounter for fracture with routine healing: Secondary | ICD-10-CM | POA: Diagnosis not present

## 2020-09-23 DIAGNOSIS — M80022D Age-related osteoporosis with current pathological fracture, left humerus, subsequent encounter for fracture with routine healing: Secondary | ICD-10-CM | POA: Diagnosis not present

## 2020-09-23 DIAGNOSIS — G20 Parkinson's disease: Secondary | ICD-10-CM | POA: Diagnosis not present

## 2020-09-23 DIAGNOSIS — K219 Gastro-esophageal reflux disease without esophagitis: Secondary | ICD-10-CM | POA: Diagnosis not present

## 2020-09-23 DIAGNOSIS — M800AXD Age-related osteoporosis with current pathological fracture, other site, subsequent encounter for fracture with routine healing: Secondary | ICD-10-CM | POA: Diagnosis not present

## 2020-09-24 DIAGNOSIS — G20 Parkinson's disease: Secondary | ICD-10-CM | POA: Diagnosis not present

## 2020-09-24 DIAGNOSIS — M800AXD Age-related osteoporosis with current pathological fracture, other site, subsequent encounter for fracture with routine healing: Secondary | ICD-10-CM | POA: Diagnosis not present

## 2020-09-24 DIAGNOSIS — K219 Gastro-esophageal reflux disease without esophagitis: Secondary | ICD-10-CM | POA: Diagnosis not present

## 2020-09-24 DIAGNOSIS — M80052D Age-related osteoporosis with current pathological fracture, left femur, subsequent encounter for fracture with routine healing: Secondary | ICD-10-CM | POA: Diagnosis not present

## 2020-09-24 DIAGNOSIS — T84121D Displacement of internal fixation device of left humerus, subsequent encounter: Secondary | ICD-10-CM | POA: Diagnosis not present

## 2020-09-24 DIAGNOSIS — M80022D Age-related osteoporosis with current pathological fracture, left humerus, subsequent encounter for fracture with routine healing: Secondary | ICD-10-CM | POA: Diagnosis not present

## 2020-09-27 DIAGNOSIS — M800AXD Age-related osteoporosis with current pathological fracture, other site, subsequent encounter for fracture with routine healing: Secondary | ICD-10-CM | POA: Diagnosis not present

## 2020-09-27 DIAGNOSIS — M80022D Age-related osteoporosis with current pathological fracture, left humerus, subsequent encounter for fracture with routine healing: Secondary | ICD-10-CM | POA: Diagnosis not present

## 2020-09-27 DIAGNOSIS — K219 Gastro-esophageal reflux disease without esophagitis: Secondary | ICD-10-CM | POA: Diagnosis not present

## 2020-09-27 DIAGNOSIS — M80052D Age-related osteoporosis with current pathological fracture, left femur, subsequent encounter for fracture with routine healing: Secondary | ICD-10-CM | POA: Diagnosis not present

## 2020-09-27 DIAGNOSIS — T84121D Displacement of internal fixation device of left humerus, subsequent encounter: Secondary | ICD-10-CM | POA: Diagnosis not present

## 2020-09-27 DIAGNOSIS — G20 Parkinson's disease: Secondary | ICD-10-CM | POA: Diagnosis not present

## 2020-09-30 DIAGNOSIS — K219 Gastro-esophageal reflux disease without esophagitis: Secondary | ICD-10-CM | POA: Diagnosis not present

## 2020-09-30 DIAGNOSIS — M800AXD Age-related osteoporosis with current pathological fracture, other site, subsequent encounter for fracture with routine healing: Secondary | ICD-10-CM | POA: Diagnosis not present

## 2020-09-30 DIAGNOSIS — M80022D Age-related osteoporosis with current pathological fracture, left humerus, subsequent encounter for fracture with routine healing: Secondary | ICD-10-CM | POA: Diagnosis not present

## 2020-09-30 DIAGNOSIS — T84121D Displacement of internal fixation device of left humerus, subsequent encounter: Secondary | ICD-10-CM | POA: Diagnosis not present

## 2020-09-30 DIAGNOSIS — G20 Parkinson's disease: Secondary | ICD-10-CM | POA: Diagnosis not present

## 2020-09-30 DIAGNOSIS — M80052D Age-related osteoporosis with current pathological fracture, left femur, subsequent encounter for fracture with routine healing: Secondary | ICD-10-CM | POA: Diagnosis not present

## 2020-10-02 DIAGNOSIS — G20 Parkinson's disease: Secondary | ICD-10-CM | POA: Diagnosis not present

## 2020-10-02 DIAGNOSIS — M80022D Age-related osteoporosis with current pathological fracture, left humerus, subsequent encounter for fracture with routine healing: Secondary | ICD-10-CM | POA: Diagnosis not present

## 2020-10-02 DIAGNOSIS — T84121D Displacement of internal fixation device of left humerus, subsequent encounter: Secondary | ICD-10-CM | POA: Diagnosis not present

## 2020-10-02 DIAGNOSIS — M80052D Age-related osteoporosis with current pathological fracture, left femur, subsequent encounter for fracture with routine healing: Secondary | ICD-10-CM | POA: Diagnosis not present

## 2020-10-02 DIAGNOSIS — K219 Gastro-esophageal reflux disease without esophagitis: Secondary | ICD-10-CM | POA: Diagnosis not present

## 2020-10-02 DIAGNOSIS — M800AXD Age-related osteoporosis with current pathological fracture, other site, subsequent encounter for fracture with routine healing: Secondary | ICD-10-CM | POA: Diagnosis not present

## 2020-10-07 DIAGNOSIS — M810 Age-related osteoporosis without current pathological fracture: Secondary | ICD-10-CM | POA: Diagnosis not present

## 2020-11-05 DIAGNOSIS — J449 Chronic obstructive pulmonary disease, unspecified: Secondary | ICD-10-CM | POA: Diagnosis not present

## 2020-11-05 DIAGNOSIS — R059 Cough, unspecified: Secondary | ICD-10-CM | POA: Diagnosis not present

## 2020-11-05 DIAGNOSIS — J439 Emphysema, unspecified: Secondary | ICD-10-CM | POA: Diagnosis not present

## 2020-11-05 DIAGNOSIS — I10 Essential (primary) hypertension: Secondary | ICD-10-CM | POA: Diagnosis not present

## 2020-11-05 DIAGNOSIS — E0590 Thyrotoxicosis, unspecified without thyrotoxic crisis or storm: Secondary | ICD-10-CM | POA: Diagnosis not present

## 2020-11-05 DIAGNOSIS — Z125 Encounter for screening for malignant neoplasm of prostate: Secondary | ICD-10-CM | POA: Diagnosis not present

## 2020-11-05 DIAGNOSIS — R7309 Other abnormal glucose: Secondary | ICD-10-CM | POA: Diagnosis not present

## 2020-11-05 DIAGNOSIS — E7801 Familial hypercholesterolemia: Secondary | ICD-10-CM | POA: Diagnosis not present

## 2020-11-12 DIAGNOSIS — L719 Rosacea, unspecified: Secondary | ICD-10-CM | POA: Diagnosis not present

## 2020-11-12 DIAGNOSIS — G20 Parkinson's disease: Secondary | ICD-10-CM | POA: Diagnosis not present

## 2020-11-12 DIAGNOSIS — B0229 Other postherpetic nervous system involvement: Secondary | ICD-10-CM | POA: Diagnosis not present

## 2020-11-12 DIAGNOSIS — R972 Elevated prostate specific antigen [PSA]: Secondary | ICD-10-CM | POA: Diagnosis not present

## 2020-11-12 DIAGNOSIS — R351 Nocturia: Secondary | ICD-10-CM | POA: Diagnosis not present

## 2020-11-12 DIAGNOSIS — M3322 Polymyositis with myopathy: Secondary | ICD-10-CM | POA: Diagnosis not present

## 2020-11-12 DIAGNOSIS — Z0000 Encounter for general adult medical examination without abnormal findings: Secondary | ICD-10-CM | POA: Diagnosis not present

## 2020-11-12 DIAGNOSIS — D649 Anemia, unspecified: Secondary | ICD-10-CM | POA: Diagnosis not present

## 2020-11-19 ENCOUNTER — Telehealth: Payer: Self-pay | Admitting: Neurology

## 2020-11-19 NOTE — Telephone Encounter (Signed)
Pt's Alexander Thomas (on DPR) request refill rasagiline (AZILECT) 1 MG TABS tablet at [facility] #[id number]

## 2020-11-20 MED ORDER — RASAGILINE MESYLATE 1 MG PO TABS
1.0000 mg | ORAL_TABLET | Freq: Every day | ORAL | 3 refills | Status: DC
Start: 2020-11-20 — End: 2021-06-10

## 2020-11-20 NOTE — Telephone Encounter (Signed)
Refills have been sent to the pharmacy 

## 2020-12-02 ENCOUNTER — Encounter: Payer: Self-pay | Admitting: Neurology

## 2020-12-02 ENCOUNTER — Ambulatory Visit (INDEPENDENT_AMBULATORY_CARE_PROVIDER_SITE_OTHER): Payer: Medicare Other | Admitting: Neurology

## 2020-12-02 ENCOUNTER — Other Ambulatory Visit: Payer: Self-pay

## 2020-12-02 VITALS — BP 119/67 | HR 83 | Ht 69.0 in | Wt 159.0 lb

## 2020-12-02 DIAGNOSIS — G20 Parkinson's disease: Secondary | ICD-10-CM | POA: Diagnosis not present

## 2020-12-02 DIAGNOSIS — R269 Unspecified abnormalities of gait and mobility: Secondary | ICD-10-CM

## 2020-12-02 DIAGNOSIS — M3320 Polymyositis, organ involvement unspecified: Secondary | ICD-10-CM

## 2020-12-02 MED ORDER — AZATHIOPRINE 50 MG PO TABS
50.0000 mg | ORAL_TABLET | Freq: Two times a day (BID) | ORAL | 4 refills | Status: DC
Start: 2020-12-02 — End: 2021-06-10

## 2020-12-02 MED ORDER — CARBIDOPA-LEVODOPA 25-100 MG PO TABS
1.0000 | ORAL_TABLET | Freq: Three times a day (TID) | ORAL | 4 refills | Status: DC
Start: 2020-12-02 — End: 2021-06-10

## 2020-12-02 NOTE — Progress Notes (Signed)
ASSESSMENT AND PLAN 75 y.o. year old male  Idiopathic Parkinson's disease  He continues to decline slowly, has increased difficulty, but only remember taking Sinemet 1 tablet daily, today he was found to be under medicated, I have suggested him Sinemet 25/100 mg 3 times a day at 7, noon, 5 PM  Continue Azilect 1 mg daily  Could not tolerate dopamine agonist Requip in the past, complains worsening memory loss,  History of polymyositis  Doing well, there was no muscle weakness noted, continue Imuran 50 mg twice a day   History postural orthostatic tachycardia:  Overall mild improvement, today I did not see any significant orthostatic blood pressure changes,  Emphasized importance of increased water intake   DIAGNOSTIC DATA (LABS, IMAGING, TESTING) - I reviewed patient records, labs, notes, testing and imaging myself where available. Laboratory evaluation in December 2021, normal CBC,  hemoglobin of 14.6, CMP, creat 0.83, TSH 2.03.    HISTORY OF PRESENT ILLNESS: Alexander Thomas is a 75 years old right-handed male, accompanied by his wife, seen in refer by his primary care physician Dr. Jani Gravel for evaluation of right hand tremor, Initial evaluation was in August 2016   I have reviewed and summarized his most recent office note   He had a past medical history of hyperlipidemia, Raynaud's disease,, had left carpal tunnel release surgery, right wrist fracture surgery, polymyositis,  left deltoid muscle biopsy consistent with fiber atrophy, perivasculitis in August 2002, at its worst, he had difficulty standing up, need his wife help him dressing, he has taken methotrexate along with low-dose prednisone for many years, in 2015, he was switched to Imuran 50 mg twice a day, most recent CPK level was 83, he denies significant muscle weakness now   I also reviewed laboratory results in July 2016, normal CMP, CBC, CPK 83, mild elevated LDL 134, normal ESR 6, hepatitis panel was negative   He  is a retired Company secretary, since 2014, he noticed mild right hand tremor, getting worse gradually, there was no significant gait difficulty, mild lack of facial expression, he has lack of smell, which has been a chronic issues, he denies REM sleep disorder, mild constipations.   Update February 04 2015: He was diagnosed with mild Parkinson's disease, was started on Azilect since August 2016, does not notice any symptomatic improvement either,   MRI of the brain without contrast in August 2016, mild supratentorium small vessel disease, no acute lesions.   UPDATE May 07 2015: He was started on requip xr titrating dose since October 2016, He still has mild right hand shaking, left hand is less, he is now taking Azilect 1 mg every day, requip xr 2 mg 2 tablets every night at 8pm.  He did not noticed any significant side effect.  It does help his tremor   He denies muscle weakness or muscle pain, he had a history of inflammatory polymyositis   UPDATE July 17th 2017: He is taking Azilect 20m qday, he tolerated requip xL 475m2 tabs po qhs well, he still has tremor, he sleeps well, he lost sense of smell, he has no significant gait abnormality, he has ok appetite, he exercise regularly,  5-10 mintues sit ups at that time, he denies obsessive compulsive behavior.   Update August 10 2016: Parkinson's Disease,  He is now taking ropinirole XL 4 mg 4 tablets every night, Azilect 1 mg every morning, there was no significant fluctuation noticed, he ambulate without much difficulty, does has intermittent right hand resting  tremor,   Inflammatory polymyositis Imuran 50 mg 2 tablets every day He is doing well, still has significant right hand tremor,    UPDATE November 16 2017: He feels fine, continue to pastor a smaller church, taking Requip xl 51m4 tabs qhs, azilect 113mdaily, imuran 5057m tabs every day, he denies significant muscle weakness, stated he can walk better, but has mild increased drooling, he denies  significant memory loss,   UPDATE Feb 6th 2020: He is accompanied by his wife at today's clinical visit, missing his medication frequently, slow worsening memory loss, Today's [place] examination is 23 out of 30 no significant muscle weakness, He still passed her small church of elderly attendants  UPDATE Aug 29 2019: He has constant right hand tremor, 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, was noted by his wife has increased gait abnormality, mild memory loss, MoCA examination is 21/30 today  He also complains of significant low back pain, stay at midline, unbearable sometimes, especially after prolonged standing or walking  Sleep study in October 2020 showed no significant obstructive sleep apnea, he has frequent nocturia, wake up 3-4 times each night, usually able to go back to sleep without much difficulty  Update March 26, 2020 Wife concerned about his weight loss, 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  Update July 30, 2020 He fell broke his left elbow require surgery on June 02, 2020, also suffered a left pelvic fracture, now just began to weightbearing, he stayed at nursing home since then, he tried to limit his water intake worried about going to the bathroom  He was noted to have orthostatic dizziness, hypotension, today he did not have significant blood pressure drop getting up from seated position, but there was significant orthostatic tachycardia  Blood pressure sitting down 122/75/80; standing up 115/64, heart rate of 96; standing up for 1 minute 122/72, heart rate of 96; standing up to 3 minutes 128/77, heart rate of 103, is mildly symptomatic,  UPDATE December 02 2020: He is accompanied by his wife at today's visit, overall stable, he spent most time sitting down, still  gives sermon once a week, chronic taking Sinemet 25/100 mg 1 tablet in the morning, because mostly sedentary, he does not complains of orthostatic dizziness getting home, today sitting down blood pressure 120/70, standing up 110/70, he did not complains of orthostatic symptoms, but apparently he has increased gait abnormality, was also undermedicated with Sinemet   PHYSICAL EXAM  Vitals:   07/30/20 1224  BP: 119/73  Pulse: 79   There is no height or weight on file to calculate BMI.  PHYSICAL EXAMNIATION:  Gen: NAD, conversant, well nourised, well groomed       NEUROLOGICAL EXAM:  MENTAL STATUS: Speech/Cognition: Microphonias, wet soft voice  MMSE - Mini Mental State Exam 03/26/2020  Orientation to time 5  Orientation to Place 5  Registration 3  Attention/ Calculation 5  Recall 2  Language- name 2 objects 2  Language- repeat 1  Language- follow 3 step command 3  Language- read & follow direction 1  Write a sentence 1  Copy design 1  Total score 29   CRANIAL NERVES: CN II: Visual fields are full to confrontation.  Pupils are round equal and briskly reactive to light. CN III, IV, VI: extraocular movement are normal. No ptosis.  CN V: Facial sensation is intact to light touch. CN VII: Face is symmetric with normal eye closure and smile. CN VIII: Hearing is normal to casual conversation CN IX, X: Palate elevates symmetrically. Phonation is normal. CN XI: Head turning and shoulder shrug are intact    MOTOR: He has constant left upper extremity resting tremor which is also in the left elbow brace, no weakness, left more than right limb rigidity, bradykinesia  REFLEXES: Reflexes are 1  and symmetric at the biceps, triceps, knees and ankles. Plantar responses are flexor.  SENSORY: Intact to light touch, pinprick, positional and vibratory sensation at fingers and toes.  COORDINATION: There is no trunk or limb ataxia.    GAIT/STANCE: He needs push-up to get up from  seated position, small shuffling gait,camptocormia.  REVIEW OF SYSTEMS: Out of a complete 14 system review of symptoms, the patient complains only of the following symptoms, and all other reviewed systems are negative.  Tremor, apnea  ALLERGIES: No Known Allergies  HOME MEDICATIONS: Outpatient Medications Prior to Visit  Medication Sig Dispense Refill   azaTHIOprine (IMURAN) 50 MG tablet Take 1 tablet (50 mg total) by mouth in the morning and at bedtime. 180 tablet 4   carbidopa-levodopa (SINEMET IR) 25-100 MG tablet Take 1 tablet by mouth 4 (four) times daily. 7am, 11am, 15pm, 19pm. 120 tablet 11   COD LIVER OIL PO Take by mouth.     doxycycline (VIBRAMYCIN) 100 MG capsule as needed.   3   gabapentin (NEURONTIN) 300 MG capsule as needed.      magnesium hydroxide (MILK OF MAGNESIA) 400 MG/5ML suspension Take 5 mLs by mouth as needed for mild constipation.     rasagiline (AZILECT) 1 MG TABS tablet Take 1 tablet (1 mg total) by mouth daily. 90 tablet 3   terbinafine (LAMISIL) 250 MG tablet Take 250 mg by mouth daily.     sildenafil (VIAGRA) 50 MG tablet Take 50 mg by mouth as needed for erectile dysfunction.     No facility-administered medications prior to visit.    PAST MEDICAL HISTORY: Past Medical History:  Diagnosis Date   Hypercholesteremia    Migraine    Polymyositis (HCC)    Raynaud disease    Tremor of right hand     PAST SURGICAL HISTORY: Past Surgical History:  Procedure Laterality Date   CARPAL TUNNEL RELEASE Left    INGUINAL HERNIA REPAIR Bilateral    WRIST SURGERY Right     FAMILY HISTORY: Family History  Problem Relation Age of Onset   Uterine cancer Mother    Healthy Father    Kidney cancer Brother    Colon cancer Brother     SOCIAL HISTORY: Social History   Socioeconomic History   Marital status: Married    Spouse name: Dollie   Number of children: 2   Years of education: PhD   Highest education level: Not on file  Occupational History    Occupation: Company secretary  Tobacco Use   Smoking status: Never   Smokeless tobacco: Never  Substance and Sexual Activity   Alcohol use: No    Alcohol/week: 0.0 standard drinks   Drug use: No   Sexual activity: Not on file  Other Topics Concern   Not on file  Social History Narrative   Lives at home with his wife.   Right-handed.   1 cup caffeine daily.   Social Determinants of Health   Financial Resource Strain: Not on file  Food Insecurity: Not on file  Transportation Needs: Not on file  Physical Activity: Not on file  Stress: Not on file  Social Connections: Not on file  Intimate Partner Violence: Not on file       Marcial Pacas, M.D. Ph.D.  [facility] [place], [place] [address] Phone: [phone] Fax:      [phone]

## 2020-12-10 ENCOUNTER — Ambulatory Visit: Payer: Medicare Other | Admitting: Occupational Therapy

## 2020-12-10 ENCOUNTER — Telehealth: Payer: Self-pay

## 2020-12-10 ENCOUNTER — Other Ambulatory Visit: Payer: Self-pay

## 2020-12-10 ENCOUNTER — Ambulatory Visit: Payer: Medicare Other | Attending: Internal Medicine | Admitting: Physical Therapy

## 2020-12-10 ENCOUNTER — Ambulatory Visit: Payer: Medicare Other

## 2020-12-10 DIAGNOSIS — R29818 Other symptoms and signs involving the nervous system: Secondary | ICD-10-CM

## 2020-12-10 DIAGNOSIS — R2689 Other abnormalities of gait and mobility: Secondary | ICD-10-CM | POA: Insufficient documentation

## 2020-12-10 DIAGNOSIS — R972 Elevated prostate specific antigen [PSA]: Secondary | ICD-10-CM | POA: Diagnosis not present

## 2020-12-10 DIAGNOSIS — R278 Other lack of coordination: Secondary | ICD-10-CM

## 2020-12-10 DIAGNOSIS — G20 Parkinson's disease: Secondary | ICD-10-CM

## 2020-12-10 DIAGNOSIS — D649 Anemia, unspecified: Secondary | ICD-10-CM | POA: Diagnosis not present

## 2020-12-10 DIAGNOSIS — R471 Dysarthria and anarthria: Secondary | ICD-10-CM

## 2020-12-10 NOTE — Telephone Encounter (Signed)
Orders Placed This Encounter  Procedures   Ambulatory referral to Occupational Therapy   Ambulatory referral to Physical Therapy

## 2020-12-10 NOTE — Telephone Encounter (Signed)
Orders Placed This Encounter  ?Procedures  ? Ambulatory referral to Occupational Therapy  ? ?   ?

## 2020-12-10 NOTE — Therapy (Signed)
[facility] [address] [place], [place], [address] Phone: [phone]   Fax:  [phone]  Patient Details  Name: Alexander Thomas MRN: [id number] Date of Birth: Sep 12, 1945 Referring Provider:  Jani Gravel, MD  Encounter Date: 12/10/2020  Occupational Therapy Parkinson's Disease Screen  Hand dominance:  right   Physical Performance Test item #4 (donning/doffing jacket):  needs assist, could only don on RUE without assist  Fastening/unfastening 3 buttons in:  63mn 47 sec  9-hole peg test:    RUE  51.87 sec        LUE  40.94 sec  Change in ability to perform ADLs/IADLs:  needs to eat with spoon, incr time for ADLs/mobility, assist for jacket  Other Comments:  resting tremor R hand  Pt would benefit from occupational therapy evaluation due to  slowing/difficulty with ADLs and coordination deficits.      [facility]8/23/2022, 1:40 PM  [place] [address]Phone: [phone]  Fax:  3[place] OTR/L [facility] [place]az[place]  [phone] [phone]/23/22 1:50 PM

## 2020-12-10 NOTE — Addendum Note (Signed)
Addended by: Marcial Pacas on: 12/10/2020 05:33 PM   Modules accepted: Orders

## 2020-12-10 NOTE — Addendum Note (Signed)
Addended by: Marcial Pacas on: 12/10/2020 03:57 PM   Modules accepted: Orders

## 2020-12-10 NOTE — Telephone Encounter (Signed)
Alexander Thomas participated in a multi-disciplinary Parkinson's Disease clinic today and was found to have need for an OT evaluation due to incr'd difficulty with ADLs, and coordination deficits, a PT eval due to slowed mobility measures compared to December 2021 d/c, and a ST eval due to reduced conversational volume (mid 60s dB  when WNL is 70-72dB). If agreed, please order. Thank you!  Garald Balding ,[place], [place] [address], [place], [place] [address]  [phone]

## 2020-12-10 NOTE — Therapy (Signed)
[facility] [address] [place], [place], [address] Phone: [phone]   Fax:  [phone]  Patient Details  Name: Alexander Thomas MRN: [id number] Date of Birth: 10/04/1945 Referring Provider:  Jamey Ripa, MD  Encounter Date: 12/10/2020     Speech Therapy Parkinson's Disease Screen   Decibel Level today: mid to upper 60s dB (WNL=70-72 dB) faded to mid 60s dB average as conversation progressed > 7 minutes. Pt's conversational volume is lower than normal and ST evaluation is warranted at this time. 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  Pt does not report difficulty in swallowing at this time.  Pt would benefit from speech-language eval for dysarthria - please order via Epic if agreed.    [facility]  ,[place], [place]  12/10/2020, 1:58 PM  [facility] [address] [place], [place], [address] Phone: [phone]   Fax:  [phone]

## 2020-12-10 NOTE — Therapy (Signed)
[facility] [address] [place], [place], [address] Phone: [phone]   Fax:  [phone]  Patient Details  Name: Alexander Thomas MRN: [id number] Date of Birth: Nov 18, 1945 Referring Provider:  Jani Gravel, MD  Encounter Date: 12/10/2020  Physical Therapy Parkinson's Disease Screen   Timed Up and Go test: 20.78 sec  10 meter walk test: 18.13 sec = 1.81 ft/sec  5 time sit to stand test: 20.75 sec  Patient would benefit from Physical Therapy evaluation due to slowed mobility measures compared to d/c from PT in December.  Pt has also had 2 falls.    Frazier Butt 12/10/2020, 1:23 PM Frazier Butt., PT   [facility] [address] [place], [place], [address] Phone: [phone]   Fax:  [phone]

## 2020-12-17 DIAGNOSIS — Z79899 Other long term (current) drug therapy: Secondary | ICD-10-CM | POA: Diagnosis not present

## 2020-12-17 DIAGNOSIS — N529 Male erectile dysfunction, unspecified: Secondary | ICD-10-CM | POA: Diagnosis not present

## 2020-12-17 DIAGNOSIS — Z23 Encounter for immunization: Secondary | ICD-10-CM | POA: Diagnosis not present

## 2020-12-17 DIAGNOSIS — D649 Anemia, unspecified: Secondary | ICD-10-CM | POA: Diagnosis not present

## 2020-12-17 DIAGNOSIS — R972 Elevated prostate specific antigen [PSA]: Secondary | ICD-10-CM | POA: Diagnosis not present

## 2020-12-17 DIAGNOSIS — R351 Nocturia: Secondary | ICD-10-CM | POA: Diagnosis not present

## 2020-12-24 DIAGNOSIS — M810 Age-related osteoporosis without current pathological fracture: Secondary | ICD-10-CM | POA: Diagnosis not present

## 2020-12-24 DIAGNOSIS — I7300 Raynaud's syndrome without gangrene: Secondary | ICD-10-CM | POA: Diagnosis not present

## 2020-12-24 DIAGNOSIS — G20 Parkinson's disease: Secondary | ICD-10-CM | POA: Diagnosis not present

## 2020-12-24 DIAGNOSIS — M3322 Polymyositis with myopathy: Secondary | ICD-10-CM | POA: Diagnosis not present

## 2020-12-24 DIAGNOSIS — Z79899 Other long term (current) drug therapy: Secondary | ICD-10-CM | POA: Diagnosis not present

## 2020-12-31 ENCOUNTER — Other Ambulatory Visit: Payer: Self-pay

## 2020-12-31 ENCOUNTER — Ambulatory Visit: Payer: Medicare Other

## 2020-12-31 ENCOUNTER — Encounter: Payer: Self-pay | Admitting: Physical Therapy

## 2020-12-31 ENCOUNTER — Encounter: Payer: Self-pay | Admitting: Occupational Therapy

## 2020-12-31 ENCOUNTER — Ambulatory Visit: Payer: Medicare Other | Attending: Internal Medicine | Admitting: Physical Therapy

## 2020-12-31 ENCOUNTER — Ambulatory Visit: Payer: Medicare Other | Admitting: Occupational Therapy

## 2020-12-31 DIAGNOSIS — M25622 Stiffness of left elbow, not elsewhere classified: Secondary | ICD-10-CM | POA: Diagnosis not present

## 2020-12-31 DIAGNOSIS — R293 Abnormal posture: Secondary | ICD-10-CM

## 2020-12-31 DIAGNOSIS — R2681 Unsteadiness on feet: Secondary | ICD-10-CM | POA: Diagnosis not present

## 2020-12-31 DIAGNOSIS — R41841 Cognitive communication deficit: Secondary | ICD-10-CM | POA: Diagnosis not present

## 2020-12-31 DIAGNOSIS — M25621 Stiffness of right elbow, not elsewhere classified: Secondary | ICD-10-CM | POA: Insufficient documentation

## 2020-12-31 DIAGNOSIS — M6281 Muscle weakness (generalized): Secondary | ICD-10-CM | POA: Diagnosis not present

## 2020-12-31 DIAGNOSIS — R278 Other lack of coordination: Secondary | ICD-10-CM | POA: Diagnosis not present

## 2020-12-31 DIAGNOSIS — M25612 Stiffness of left shoulder, not elsewhere classified: Secondary | ICD-10-CM | POA: Insufficient documentation

## 2020-12-31 DIAGNOSIS — R471 Dysarthria and anarthria: Secondary | ICD-10-CM | POA: Insufficient documentation

## 2020-12-31 DIAGNOSIS — R2689 Other abnormalities of gait and mobility: Secondary | ICD-10-CM | POA: Insufficient documentation

## 2020-12-31 DIAGNOSIS — R29818 Other symptoms and signs involving the nervous system: Secondary | ICD-10-CM | POA: Insufficient documentation

## 2020-12-31 DIAGNOSIS — M25611 Stiffness of right shoulder, not elsewhere classified: Secondary | ICD-10-CM

## 2020-12-31 DIAGNOSIS — R29898 Other symptoms and signs involving the musculoskeletal system: Secondary | ICD-10-CM

## 2020-12-31 DIAGNOSIS — R251 Tremor, unspecified: Secondary | ICD-10-CM | POA: Insufficient documentation

## 2020-12-31 NOTE — Therapy (Signed)
[facility] [address] [place], [place], [address] Phone: [phone]   Fax:  [phone]  Speech Language Pathology Evaluation  Patient Details  Name: Alexander Thomas MRN: [id number] Date of Birth: [birth year] Referring Provider (SLP): Alexander Pacas, MD   Encounter Date: 12/31/2020   End of Session - 12/31/20 1431     Visit Number 1    Number of Visits 25    Date for SLP Re-Evaluation 03/28/21    Authorization Type Medicare    SLP Start Time 1015    SLP Stop Time  1100    SLP Time Calculation (min) 45 min    Activity Tolerance Patient tolerated treatment well             Past Medical History:  Diagnosis Date   Hypercholesteremia    Migraine    Polymyositis ([place])    Raynaud disease    Tremor of right hand     Past Surgical History:  Procedure Laterality Date   CARPAL TUNNEL RELEASE Left    INGUINAL HERNIA REPAIR Bilateral    WRIST SURGERY Right     There were no vitals filed for this visit.       SLP Evaluation [facility] - 12/31/20 1005       SLP Visit Information   SLP Received On 12/10/20    Referring Provider (SLP) Alexander Pacas, MD    Onset Date PD dx 2016    Medical Diagnosis Idiopathic Parkinson's disease      Subjective   Subjective "my wife says I talk a little low but I can raise my level. I'm a preacher"      Pain Assessment   Currently in Pain? No/denies      General Information   Mobility Status ambulating with cane present but not using cane - PT/OT eval today      Balance Screen   Has the patient fallen in the past 6 months Yes   fall in February and injured left hip   How many times? 2    Has the patient had a decrease in activity level because of a fear of falling?  No    Is the patient reluctant to leave their home because of a fear of falling?  No      Prior Functional Status   Cognitive/Linguistic Baseline Within functional limits    Type of Home House     Lives With  Spouse    Available Support Family    Education Doctorate in [employer]    Vocation Full time employment   pastor     Cognition   Overall Cognitive Status No family/caregiver present to determine baseline cognitive functioning   pt denied changes in cognition; however, pt exhibited occasional episodes of reduced recall     Auditory Comprehension   Overall Auditory Comprehension Appears within functional limits for tasks assessed      Expression   Primary Mode of Expression Verbal      Verbal Expression   Overall Verbal Expression Appears within functional limits for tasks assessed      Oral Motor/Sensory Function   Overall Oral Motor/Sensory Function Appears within functional limits for tasks assessed      Motor Speech   Overall Motor Speech Impaired    Respiration Impaired    Level of Impairment Conversation    Phonation Low vocal intensity    Resonance Within functional limits    Articulation Impaired    Level of Impairment Conversation  Intelligibility Intelligibility reduced    Word 75-100% accurate    Phrase 75-100% accurate    Sentence 75-100% accurate    Conversation 75-100% accurate    Motor Planning Witnin functional limits    Effective Techniques Slow rate;Increased vocal intensity;Over-articulate;Pacing                             SLP Education - 12/31/20 1006     Education Details eval results, possible goals, dysarthria compensations    Person(s) Educated Patient    Methods Explanation;Demonstration;Handout    Comprehension Verbalized understanding;Returned demonstration;Need further instruction              SLP Short Term Goals - 12/31/20 1007       SLP SHORT TERM GOAL #1   Title Pt will complete dysarthria HEP given occasional min A over 2 sessions    Time 4    Period Weeks    Status New    Target Date 01/31/21      SLP SHORT TERM GOAL #2   Title Pt will produce loud /a/ or "hey!" with at least low 90s dB average over  three sessions    Time 4    Period Weeks    Status New    Target Date 01/31/21      SLP SHORT TERM GOAL #3   Title Pt will demo abdominal breathing at rest and during sentence responses with 80% accuracy given occasional min A over 2 sessions    Time 4    Period Weeks    Status New    Target Date 01/31/21      SLP SHORT TERM GOAL #4   Title Pt will maintain average of 70 DB in Q and A using compensations given occasional min A across 3 sessions    Time 4    Period Weeks    Status New    Target Date 01/31/21      SLP SHORT TERM GOAL #5   Title Pt will demonstrate dysarthria compensations in 15 minute mod complex conversation with occasional min A over 2 sessions    Time 4    Period Weeks    Status New    Target Date 01/31/21              SLP Long Term Goals - 12/31/20 1439       SLP LONG TERM GOAL #1   Title Pt will complete dysarthria HEP given rare min A over 2 sessions    Time 8    Period Weeks    Status New    Target Date 02/28/21      SLP LONG TERM GOAL #2   Title Pt will maintain average of 70 DB in 30 conversation with rare min A across 3 sessions    Time 12    Period Weeks    Status New    Target Date 03/28/21      SLP LONG TERM GOAL #3   Title Pt will present 3 sermons with WNL volume given rare min A for compensations    Time 12    Period Weeks    Status New    Target Date 03/28/21      SLP LONG TERM GOAL #4   Title SLP will assess cognitive linguistic functioning if needed during POC (goals added if needed)    Time 12    Period Weeks    Status New  Target Date 03/28/21              Plan - 12/31/20 1008     Clinical Impression Statement "Alexander Thomas" was referred for OPST to address 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 Pt was unaccompanied today. Conversational speech faded from low 70s dB to mid to upper 60s dB towards end of evaluation as conversation progressed. Sustained phonation of /a/ averaged ~90 dB over 5  trials, with reduced breath support noted by last trial. Reduced breath support exhibited during sustained phonation tasks and following paragraph level reading tasks. SLP conducted speech intelligibility assessment at word level via Tikofsky's 50 Word Intelligibility Test, in which pt was 100% intelligible at word level. Word level loudness ranged from 66 dB to 77 dB. Pt is currently an active pastor and is reportedly compensating independently by raising voice and maintaining it over 30 minute sermons. Pt denied feeling short of breath while preaching. Pt's wife reportedly has some trouble hearing but often requests for patient to repeat himself in order to understand him. Pt declined any difficulty swallowing. Pt also denied any changes in cognition; however, some episodes of reduced recall noted in discussion of personal information. SLP to further assess cognitive linguistic skills as needed during POC. 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    Speech Therapy Frequency 2x / week    Duration 12 weeks   12 weeks to account for scheduling and missed visits   Treatment/Interventions Compensatory strategies;Functional tasks;Patient/family education;Compensatory techniques;Internal/external aids;SLP instruction and feedback    Potential to Achieve Goals Good    SLP Home Exercise Plan loud /a/    Consulted and Agree with Plan of Care Patient             Patient will benefit from skilled therapeutic intervention in order to improve the following deficits and impairments:   Dysarthria and anarthria  Cognitive communication deficit    Problem List Patient Active Problem List   Diagnosis Date Noted   Gait abnormality 08/29/2019   Midline low back pain without sciatica 08/29/2019   Mild cognitive impairment 08/29/2019    Excessive daytime sleepiness 01/24/2019   Nocturia more than twice per night 01/24/2019   Hypersomnia with sleep apnea 01/24/2019   At risk for central sleep apnea 01/24/2019   RLS (restless legs syndrome) 01/24/2019   Idiopathic Parkinson's disease ([place]) 05/07/2015   Polymyositis ([place]) 05/07/2015    Alinda Deem, MA CCC-SLP 12/31/2020, 6:13 PM  [facility] [address] [place], [place], [address] Phone: [phone]   Fax:  [phone]  Name: Alexander Thomas MRN: [id number] Date of Birth: 1945/08/27

## 2020-12-31 NOTE — Addendum Note (Signed)
Addended by: Arliss Journey on: 12/31/2020 01:07 PM   Modules accepted: Orders

## 2020-12-31 NOTE — Therapy (Signed)
[facility] [address] [place], [place], [address] Phone: [phone]   Fax:  [phone]  Occupational Therapy Evaluation  Patient Details  Name: Alexander Thomas MRN: [id number] Date of Birth: 11-08-1945 Referring Provider (OT): Dr. Marcial Pacas   Encounter Date: 12/31/2020   OT End of Session - 12/31/20 1231     Visit Number 1    Number of Visits 25    Date for OT Re-Evaluation 03/31/21    Authorization Type Medicare & BCBS--Covered 100%.  cert. period 12/31/20-03/31/21    Authorization - Visit Number 1    Authorization - Number of Visits 10    Progress Note Due on Visit 10    OT Start Time 1103    OT Stop Time 1145    OT Time Calculation (min) 42 min    Activity Tolerance Patient tolerated treatment well    Behavior During Therapy WFL for tasks assessed/performed             Past Medical History:  Diagnosis Date   Hypercholesteremia    Migraine    Polymyositis (HCC)    Raynaud disease    Tremor of right hand     Past Surgical History:  Procedure Laterality Date   CARPAL TUNNEL RELEASE Left    INGUINAL HERNIA REPAIR Bilateral    WRIST SURGERY Right     There were no vitals filed for this visit.   Subjective Assessment - 12/31/20 1108     Subjective  Pt reports that things just take a long time    Pertinent History Parkinson's Disease.  PMH:  hx of polymyositis, hx of orthostatic tachycardia, memory, Raynaud's disease, L CTR, R wrist fx surgery, L hip fracture 2/22 from fall, hx of L elbow fx due to fall 2/22 (surgery x2), sleep apnea, restless leg syndrome, hx of back pain, mild cognitive impairment.    Limitations fall risk    Patient Stated Goals move better    Currently in Pain? Yes    Pain Score 4     Pain Location Hip    Pain Orientation Left    Pain Descriptors / Indicators Sharp;Aching    Pain Type Chronic pain    Pain Onset More than a month ago    Pain Frequency Constant     Aggravating Factors  unknown    Pain Relieving Factors nothing/unknown    Effect of Pain on Daily Activities OT will monitor as relates to treatment, but will not directly address due to location               [facility] OT Assessment - 12/31/20 1111       Assessment   Medical Diagnosis Parkinson's Disease    Referring Provider (OT) Dr. Marcial Pacas    Onset Date/Surgical Date 12/10/20   PD screen date   Hand Dominance Right    Prior Therapy PT at this location, [facility] after fall (OT/PT)      Precautions   Precautions Fall      Balance Screen   Has the patient fallen in the past 6 months Yes    How many times? 2   fall in 2/22, 1 approx 2 months ago (fell while pushing lawnmower)     Home  Environment   Family/patient expects to be discharged to: Private residence    Type of [place] Two level    Lives With [place]  Level of Independence Independent    Vocation Retired;Part time Gaffer   Leisure preach, carpentry work, Engineer, materials, gardening      ADL   Eating/Feeding --   having to use a spoon more (uses weighted spoons), wife assists with cutting at times   Grooming Modified independent   wife combs hair at times   Upper Body Bathing Modified independent    Lower Body Bathing Modified independent    Upper Body Dressing Increased time   wife assists when getting ready for church, wife assist with jacket   Lower Body Dressing --   mod I-supervision/minA at times, difficulty tying--wife assists   Toilet Transfer Modified independent    Toileting - Clothing Manipulation Modified independent   urinary urgency   Toileting -  Education administrator tub bench;Walk in shower;Grab bars    Transfers/Ambulation Related to ADL's wife opens door for car transfers      IADL   Prior Level of Function Shopping wife  performs most, pt goes with wife at times    Prior Level of Function Light Housekeeping wife performs most, pt takes out Secretary/administrator    Prior Level of Function Meal Prep wife does most, pt warms up items in microwave, pt makes breakfast    Programmer, applications own vehicle   wife does most driving and is with pt if he drives, incr time to put on seatbelt--wife assists at times   Medication Management --   wife performs as pt forgets at times   Financial Management --   wife performs most, but pt assists     Mobility   Mobility Status History of falls   shuffling gait, trunk flexed forward, decr arm swing   Mobility Status Comments ambulates with cane in the community and without device in the home      Written Expression   Dominant Hand Right    Handwriting --   pt reports micrographia     Vision - History   Baseline Vision --   wears glasses occasionally for distance, feels like he sees better without     Cognition   Overall Cognitive Status Impaired/Different from baseline    Memory Impaired    Memory Impairment --   pt reports forgetting medications   Cognition Comments pt reports losing place when reading/preaching at times      Observation/Other Assessments   Other Surveys  Select    Physical Performance Test   Yes    Simulated Eating Time (seconds) 24sec   holds spoon at the end   Simulated Eating Comments holds spoon at the end    Donning Doffing Jacket Time (seconds) needs assist    Donning Doffing Jacket Comments fastening/unfastening 3 buttons in 67sec      Posture/Postural Control   Posture/Postural Control Postural limitations    Postural Limitations Rounded Shoulders;Forward head;Flexed trunk      Coordination   9 Hole Peg Test Right;Left    Right 9 Hole Peg Test 42.10    Left 9 Hole Peg Test 48.19    Box and Blocks R-29blocks, L-27blocks    Tremors mod-significant resting tremors noted with R hand      Tone   Assessment Location Right Upper  Extremity;Left Upper Extremity      ROM / Strength   AROM / PROM / Strength AROM      AROM  Overall AROM  Deficits    Overall AROM Comments Approx 75% shoulder ROM bilaterally (R shoulder flex 100* with -55*elbow ext, L shoulder flex 105* with -60* elbow flex)      RUE Tone   RUE Tone Moderate      LUE Tone   LUE Tone Mild                                OT Short Term Goals - 12/31/20 1505       OT SHORT TERM GOAL #1   Title Pt will be independent with PD-specific HEP-- check STGs 01/29/21    Time 4    Period Weeks    Status New      OT SHORT TERM GOAL #2   Title Pt will verbalize understanding of ways to decr risk of complications related to PD.    Time 4    Period Weeks    Status New      OT SHORT TERM GOAL #3   Title Pt will improve ability to use utensils for eating and shown by improving time on PPT#2 by at least 4sec.    Baseline 24sec    Time 4    Period Weeks    Status New      OT SHORT TERM GOAL #4   Title Pt will improve functional reaching/coordination for ADLs/IADLs as shown by improving score on box and blocks test by at least 3 bilaterally.    Baseline R-29 blocks, L-27 blocks    Time 4    Period Weeks    Status New      OT SHORT TERM GOAL #5   Title Pt will verbalize understanding of memory compensation strategies and ways to keep thinking skills sharp.    Time 4    Period Weeks    Status New      OT SHORT TERM GOAL #6   Title Pt will be able to write at least 4 sentences with good legibility and only min decr in size.    Time 4    Period Weeks    Status New               OT Long Term Goals - 12/31/20 1514       OT LONG TERM GOAL #1   Title Pt will verbalize understanding of adaptive strategies/AE to incr ease/safety with ADLs/IADLs.--check LTGs 03/31/21    Time 12    Period Weeks    Status New      OT LONG TERM GOAL #2   Title Pt will demo at least 110* shoulder flex with at least -50* elbow ext for  improved overhead reaching with each UE.    Baseline R shoulder flex 100* with -55* elbow ext, L shoulder flex 105* with -60* elbow ext    Time 12    Period Weeks    Status New      OT LONG TERM GOAL #3   Title Pt will verbalize understanding of appropriate community resources/continuing fitness opportunities related to PD.    Time 12    Period Weeks    Status New      OT LONG TERM GOAL #4   Title Pt will be able to fastening/unfastening buttons in 57sec or less.    Baseline 67sec    Time 12    Period Weeks    Status New      OT LONG  TERM GOAL #5   Title Pt will be able to don/doff jacket mod I in 52mn or less.    Baseline unable/needs assist    Time 12    Period Weeks    Status New      OT LONG TERM GOAL #6   Title Pt will improve bilateral hand coordination for ADLs as shown by improving time on 9-hole peg test by at least 5sec bilaterally.    Baseline R-42.10sec, L-48.19sec    Time 12    Period Weeks    Status New                   Plan - 12/31/20 1440     Clinical Impression Statement Pt is a 75y.o. male with Parkinson's disease.  Pt was screened in multi-d PD clinic and occupational therapy was recommended due to difficulty with/slowing of ADLs.  Pt with PMH that includes:  hx of polymyositis, hx of orthostatic tachycardia, memory, Raynaud's disease, L CTR, R wrist fx surgery, L hip fracture 2/22 from fall, hx of L elbow fx due to fall 2/22 (surgery x2), sleep apnea, restless leg syndrome, hx of back pain, mild cognitive impairment.   Pt presents today with abnormal posture, bradykinesia, rigidity, decr coordination, decr ROM, tremor, decr balance/functional mobility, mild cognitive deficits.  Pt would benefit from occupational therapy to address these deficits for incr ease/safety with ADLs/IADLs, decr risk of future complications, to establish PD-specific HEP, and improved quality of life.    OT Occupational Profile and History Detailed Assessment- Review of  Records and additional review of physical, cognitive, psychosocial history related to current functional performance    Occupational performance deficits (Please refer to evaluation for details): ADL's;IADL's;Work;Leisure;Social Participation    Body Structure / Function / Physical Skills ADL;Decreased knowledge of use of DME;Balance;Dexterity;Tone;UE functional use;IADL;ROM;Mobility;Coordination;Flexibility;Decreased knowledge of precautions;FMC;Improper spinal/pelvic alignment   bradykinesia   Cognitive Skills Attention;Memory    Rehab Potential Good    Clinical Decision Making Several treatment options, min-mod task modification necessary    Comorbidities Affecting Occupational Performance: May have comorbidities impacting occupational performance    Modification or Assistance to Complete Evaluation  Min-Moderate modification of tasks or assist with assess necessary to complete eval    OT Frequency 2x / week    OT Duration 12 weeks   +eval, may d/c after 8wks depending on progress   OT Treatment/Interventions Self-care/ADL training;DME and/or AE instruction;Balance training;Therapeutic activities;Aquatic Therapy;Therapeutic exercise;Cognitive remediation/compensation;Neuromuscular education;Functional Mobility Training;Passive range of motion;Patient/family education;Manual Therapy;Energy conservation;Moist Heat    Plan initiate PWR! moves in sitting or supine, PWR! hands    Consulted and Agree with Plan of Care Patient             Patient will benefit from skilled therapeutic intervention in order to improve the following deficits and impairments:   Body Structure / Function / Physical Skills: ADL, Decreased knowledge of use of DME, Balance, Dexterity, Tone, UE functional use, IADL, ROM, Mobility, Coordination, Flexibility, Decreased knowledge of precautions, FMC, Improper spinal/pelvic alignment (bradykinesia) Cognitive Skills: Attention, Memory     Visit Diagnosis: Other symptoms and  signs involving the nervous system  Other symptoms and signs involving the musculoskeletal system  Other lack of coordination  Stiffness of right shoulder, not elsewhere classified  Stiffness of left shoulder, not elsewhere classified  Stiffness of right elbow, not elsewhere classified  Stiffness of left elbow, not elsewhere classified  Tremor  Abnormal posture  Unsteadiness on feet  Other abnormalities of gait and mobility  Problem List Patient Active Problem List   Diagnosis Date Noted   Gait abnormality 08/29/2019   Midline low back pain without sciatica 08/29/2019   Mild cognitive impairment 08/29/2019   Excessive daytime sleepiness 01/24/2019   Nocturia more than twice per night 01/24/2019   Hypersomnia with sleep apnea 01/24/2019   At risk for central sleep apnea 01/24/2019   RLS (restless legs syndrome) 01/24/2019   Idiopathic Parkinson's disease ([place]) 05/07/2015   Polymyositis ([place]) 05/07/2015    Lyric Hoar, OT/L 12/31/2020, 3:25 PM  [facility] [address] [place], [place], [address] Phone: [phone]   Fax:  [phone]  Name: Alexander Thomas MRN: [id number] Date of Birth: 04-Mar-1946  [name], OTR/L [facility] [address] [place], [place]  [address] [phone] phone [phone] 12/31/20 3:25 PM

## 2020-12-31 NOTE — Therapy (Addendum)
[facility] [address] [place], [place], [address] Phone: [phone]   Fax:  [phone]  Physical Therapy Evaluation  Patient Details  Name: Alexander Thomas MRN: [id number] Date of Birth: 1946/02/05 Referring Provider (PT): Dr. Krista Blue   Encounter Date: 12/31/2020   PT End of Session - 12/31/20 1231     Visit Number 1    Number of Visits 17    Date for PT Re-Evaluation 03/31/21    Authorization Type Medicare- part A & B    PT Start Time 1147    PT Stop Time 1229    PT Time Calculation (min) 42 min    Equipment Utilized During Treatment Gait belt    Activity Tolerance Patient tolerated treatment well    Behavior During Therapy WFL for tasks assessed/performed             Past Medical History:  Diagnosis Date   Hypercholesteremia    Migraine    Polymyositis (HCC)    Raynaud disease    Tremor of right hand     Past Surgical History:  Procedure Laterality Date   CARPAL TUNNEL RELEASE Left    INGUINAL HERNIA REPAIR Bilateral    WRIST SURGERY Right     There were no vitals filed for this visit.    Subjective Assessment - 12/31/20 1150     Subjective Wife is now in charge of his medications as he was not taking when he needed to. Reports that since he has been taking his medication as prescribed his walking and balance has gotten better. Uses a SPC occassionally, otherwise is touching walls and furniture. Has had a few falls - one happened when his foot slipped when he went through the doorway (in February 2022). Broke his L hip and elbow, went to [facility] for rehab. Has been doing some of his exercises from his previous bout of PT, but not frequently.    Pertinent History PD, inflammatory polymyositis, Raynaud's, CTR LUE, R wrist surgery, back pain;  L hip pain    Limitations Walking;House hold activities    Patient Stated Goals wants to know how much he might have slipped from last bout of  therapy, wants to work on his problem areas    Currently in Pain? Yes    Pain Score 4     Pain Location Hip    Pain Orientation Left    Pain Descriptors / Indicators Sharp;Aching    Pain Type Chronic pain    Pain Onset More than a month ago    Aggravating Factors  unknown    Pain Relieving Factors exercise    Effect of Pain on Daily Activities PT will attempt to address through exercise                [facility] PT Assessment - 12/31/20 1157       Assessment   Medical Diagnosis Parkinson's Disease    Referring Provider (PT) Dr. Krista Blue    Onset Date/Surgical Date 12/10/20   PD screen date   Hand Dominance Right    Prior Therapy PT at this location, [facility] after fall (OT/PT)      Precautions   Precautions Fall      Balance Screen   Has the patient fallen in the past 6 months Yes    How many times? 2    Has the patient had a decrease in activity level because of a fear of falling?  No  Is the patient reluctant to leave their home because of a fear of falling?  No      Home Environment   Living Environment Private residence    Living Arrangements Spouse/significant other    Type of [place] to enter;Ramped entrance    [place] of Steps 2    Entrance Stairs-Rails Can reach both    [place] Other (Comment);Multi-level   split level house   Alternate Level Stairs-Number of Steps 3 or 4   other side of room has 2 steps, has to do these everyday   Alternate Level Stairs-Rails Right;Can reach both    [employer] - single point;Tub bench;Grab bars - toilet;Grab bars - tub/shower;Walker - 2 wheels      Prior Function   Level of Independence Independent    Vocation Retired;Part time employment   minister   Leisure preach, carpentry work, Engineer, materials, gardening      Cognition   Memory Impaired    Memory Impairment Other (comment)   pt reports forgetting to take his meds     Sensation    Light Touch Appears Intact      Coordination   Fine Motor Movements are Fluid and Coordinated Yes      Posture/Postural Control   Posture/Postural Control Postural limitations    Postural Limitations Rounded Shoulders;Forward head;Flexed trunk      ROM / Strength   AROM / PROM / Strength Strength      Strength   Strength Assessment Site Hip;Knee;Ankle    Right/Left Hip Right;Left    Right Hip Flexion 4/5    Left Hip Flexion 4+/5    Right/Left Knee Right;Left    Right Knee Flexion 4+/5    Right Knee Extension 4+/5    Left Knee Flexion 4+/5    Left Knee Extension 4+/5    Right/Left Ankle Right;Left    Right Ankle Dorsiflexion 4+/5    Left Ankle Dorsiflexion 4+/5      Transfers   Transfers Sit to Stand;Stand to Sit    Sit to Stand 5: Supervision;Without upper extremity assist    Five time sit to stand comments  24.56 seconds from standard height chair    Stand to Sit 5: Supervision;Without upper extremity assist    Transfer Cueing pt needs cues to fully turn for BLE to touch chair and for proper alignment prior to sitting    Comments decr anterior lean to come to stand, forward flexed posture in standing with bilat knee flexion      Ambulation/Gait   Ambulation/Gait Yes    Ambulation/Gait Assistance 5: Supervision    Ambulation/Gait Assistance Details pt holding his cane at times    Ambulation Distance (Feet) --   clinic distances   Assistive device Straight cane    Gait Pattern Step-through pattern;Step-to pattern;Decreased arm swing - left;Decreased stride length;Decreased hip/knee flexion - right;Decreased hip/knee flexion - left;Decreased dorsiflexion - left;Decreased dorsiflexion - right;Left foot flat;Right foot flat;Shuffle;Decreased trunk rotation;Trunk flexed    Ambulation Surface Level;Indoor    Gait velocity 15.53 seconds = 2.11 ft/sec      Standardized Balance Assessment   Standardized Balance Assessment Timed Up and Go Test      Timed Up and Go Test   Normal  TUG (seconds) 15.06   pt holding cane   Cognitive TUG (seconds) 36.81   using SPC, starting at 77, counting backwards by 3     High Level  Balance   High Level Balance Comments push and release: forward: 4 small steps and min A, posterior: 4 small shuffled delayed steps, L lateral: 3 small steps, R lateral: 2 small steps                        Objective measurements completed on examination: See above findings.                PT Education - 12/31/20 1213     Education Details clincial findings, POC, measures today compared to last bout of therapy (03/2020)    Person(s) Educated Patient    Methods Explanation    Comprehension Verbalized understanding              PT Short Term Goals - 12/31/20 1240       PT SHORT TERM GOAL #1   Title Pt will perform initial HEP with wife's supervision in order to build upon functional gains made in therapy. ALL STGS DUE 01/28/21    Time 4    Period Weeks    Status New    Target Date 01/28/21      PT SHORT TERM GOAL #2   Title Pt will verbalize understanding of fall prevention in the home environment.    Time 4    Period Weeks    Status New      PT SHORT TERM GOAL #3   Title Pt will improve 5x sit <> stand time without UE support to 20 seconds or less in order to demo improved balance/functional strength for transfers.    Baseline 24.56 seconds    Time 4    Period Weeks    Status New      PT SHORT TERM GOAL #4   Title Pt will improve gait speed with SPC vs. LRAD to at least 2.4 ft/sec in order to demo improved community mobility.    Baseline 2.11 ft/sec with SPC (pt holding cane at times)    Time 4    Period Weeks    Status New               PT Long Term Goals - 12/31/20 1243       PT LONG TERM GOAL #1   Title Pt will perform progression of  HEP with wife's cues/supervision, for improved strength, balance, transfers, and gait. ALL LTGS DUE 02/25/21    Time 8    Period Weeks    Status New     Target Date 02/25/21      PT LONG TERM GOAL #2   Title Pt will improve 5x sit<>stand to less than or equal to 16.5 seconds for improved transfer efficiency and safety.    Baseline 24.56 seconds    Time 8    Period Weeks    Status New      PT LONG TERM GOAL #3   Title Pt will decr cog TUG score to 25 seconds or less in order to demo decr fall risk/improved dual tasking.    Baseline 36.81 seconds with SPC    Time 8    Period Weeks    Status New      PT LONG TERM GOAL #4   Title Pt will improve gait velocity to at least 2.7 ft/sec for improved gait efficiency and safety.    Baseline 2.11 ft/sec    Time 8    Period Weeks    Status New      PT LONG  TERM GOAL #5   Title Pt will verbalize understanding of local Parkinson's disease resources.    Time 8    Period Weeks    Status New      Additional Long Term Goals   Additional Long Term Goals Yes      PT LONG TERM GOAL #6   Title Pt will recover posterior and anterior balance in push and release test in 2 or less steps independently, for improved balance recovery    Baseline anterior and posterior = 4 small, shuffled steps, needing min A in anterior direction    Time 8    Period Weeks    Status New                    Plan - 12/31/20 1255     Clinical Impression Statement Patient is a 75 year old male referred to Neuro OPPT for PD. Pt known to this clinic and was discharged from PT 03/2020. Since then pt has had a couple falls, one which resulted in pt having a L elbow and L hip fx in Feb 2022. Pt demonstrates slowed mobility measures since he was last here.   Pt's PMH is significant for: PD, inflammatory polymyositis, Raynaud's, CTR LUE, R wrist surgery, back pain; L hip fracture 2/22 from fall, hx of L elbow fx due to fall 2/22 (surgery x2). The following deficits were present during the exam: decr functional strength, impaired balance, abnormal posture, postural instability, bradykinesia, decr timing and coordination of  gait.  Based on TUG, cog TUG, and 5x sit <> stand pt is an incr risk for falls. Pt's gait speed with SPC indicates that pt is a limited community ambulator.  Pt would benefit from skilled PT to address these impairments and functional limitations to maximize functional mobility independence and decr fall risk.    Personal Factors and Comorbidities Comorbidity 3+;Time since onset of injury/illness/exacerbation;Past/Current Experience    Comorbidities PD, inflammatory polymyositis, Raynaud's, CTR LUE, R wrist surgery, back pain; L hip fracture 2/22 from fall, hx of L elbow fx due to fall 2/22 (surgery x2)    Examination-Activity Limitations Stand;Stairs;Transfers;Squat;Locomotion Level    Examination-Participation Restrictions Medication Management;Community Activity;Yard Work    Merchant navy officer Evolving/Moderate complexity    Clinical Decision Making Moderate    Rehab Potential Good    PT Frequency 2x / week    PT Duration 12 weeks    PT Treatment/Interventions ADLs/Self Care Home Management;DME Instruction;Gait training;Therapeutic activities;Stair training;Therapeutic exercise;Balance training;Contrast Bath;Neuromuscular re-education;Patient/family education;Functional mobility training;Vestibular;Energy conservation    PT Next Visit Plan review previous HEP and make adjustments as appropriate, begin with seated PWR moves. sit <> stand training. gait training with SPC with tall posture/foot clearance - vs. determining which AD would be best for patient. standing balance. discuss seeing if his wife can come in to some future appts for incr carryover at home.    Consulted and Agree with Plan of Care Patient             Patient will benefit from skilled therapeutic intervention in order to improve the following deficits and impairments:  Abnormal gait, Decreased activity tolerance, Decreased balance, Decreased coordination, Decreased endurance, Decreased mobility, Decreased  strength, Difficulty walking, Impaired flexibility, Postural dysfunction, Pain  Visit Diagnosis: Other symptoms and signs involving the nervous system  Other abnormalities of gait and mobility  Unsteadiness on feet  Abnormal posture  Muscle weakness (generalized)     Problem List Patient Active Problem List  Diagnosis Date Noted   Gait abnormality 08/29/2019   Midline low back pain without sciatica 08/29/2019   Mild cognitive impairment 08/29/2019   Excessive daytime sleepiness 01/24/2019   Nocturia more than twice per night 01/24/2019   Hypersomnia with sleep apnea 01/24/2019   At risk for central sleep apnea 01/24/2019   RLS (restless legs syndrome) 01/24/2019   Idiopathic Parkinson's disease ([place]) 05/07/2015   Polymyositis ([place]) 05/07/2015    Arliss Journey, PT, DPT  12/31/2020, 1:06 PM  [facility] [address] [place], [place], [address] Phone: [phone]   Fax:  [phone]  Name: LERRY COUNSELMAN MRN: [id number] Date of Birth: 05-05-1945

## 2020-12-31 NOTE — Patient Instructions (Signed)
We will focus on these strategies in Speech Therapy:   [place] - EXAGGERATE YOUR MOUTH, [place] CONSONANT   Start with doing 10 LOUD "ahhhhhhhhs" twice a day

## 2021-01-14 ENCOUNTER — Ambulatory Visit: Payer: Medicare Other | Admitting: Physical Therapy

## 2021-01-14 ENCOUNTER — Ambulatory Visit: Payer: Medicare Other

## 2021-01-14 ENCOUNTER — Encounter: Payer: Self-pay | Admitting: Occupational Therapy

## 2021-01-14 ENCOUNTER — Ambulatory Visit: Payer: Medicare Other | Admitting: Occupational Therapy

## 2021-01-14 ENCOUNTER — Other Ambulatory Visit: Payer: Self-pay

## 2021-01-14 DIAGNOSIS — R2689 Other abnormalities of gait and mobility: Secondary | ICD-10-CM

## 2021-01-14 DIAGNOSIS — R251 Tremor, unspecified: Secondary | ICD-10-CM

## 2021-01-14 DIAGNOSIS — R293 Abnormal posture: Secondary | ICD-10-CM

## 2021-01-14 DIAGNOSIS — R278 Other lack of coordination: Secondary | ICD-10-CM

## 2021-01-14 DIAGNOSIS — M25622 Stiffness of left elbow, not elsewhere classified: Secondary | ICD-10-CM

## 2021-01-14 DIAGNOSIS — M25612 Stiffness of left shoulder, not elsewhere classified: Secondary | ICD-10-CM

## 2021-01-14 DIAGNOSIS — R471 Dysarthria and anarthria: Secondary | ICD-10-CM

## 2021-01-14 DIAGNOSIS — R2681 Unsteadiness on feet: Secondary | ICD-10-CM

## 2021-01-14 DIAGNOSIS — R29898 Other symptoms and signs involving the musculoskeletal system: Secondary | ICD-10-CM

## 2021-01-14 DIAGNOSIS — M6281 Muscle weakness (generalized): Secondary | ICD-10-CM | POA: Diagnosis not present

## 2021-01-14 DIAGNOSIS — R29818 Other symptoms and signs involving the nervous system: Secondary | ICD-10-CM | POA: Diagnosis not present

## 2021-01-14 DIAGNOSIS — M25611 Stiffness of right shoulder, not elsewhere classified: Secondary | ICD-10-CM

## 2021-01-14 DIAGNOSIS — R41841 Cognitive communication deficit: Secondary | ICD-10-CM

## 2021-01-14 DIAGNOSIS — M25621 Stiffness of right elbow, not elsewhere classified: Secondary | ICD-10-CM

## 2021-01-14 NOTE — Therapy (Signed)
[facility] [address] [place], [place], [address] Phone: [phone]   Fax:  [phone]  Speech Language Pathology Treatment  Patient Details  Name: Alexander Thomas MRN: [id number] Date of Birth: March 05, 1946 Referring Provider (SLP): Marcial Pacas, MD   Encounter Date: 01/14/2021   End of Session - 01/14/21 1133     Visit Number 2    Number of Visits 25    Date for SLP Re-Evaluation 03/28/21    Authorization Type Medicare    SLP Start Time 1104    SLP Stop Time  1145    SLP Time Calculation (min) 41 min    Activity Tolerance Patient tolerated treatment well             Past Medical History:  Diagnosis Date   Hypercholesteremia    Migraine    Polymyositis ([place])    Raynaud disease    Tremor of right hand     Past Surgical History:  Procedure Laterality Date   CARPAL TUNNEL RELEASE Left    INGUINAL HERNIA REPAIR Bilateral    WRIST SURGERY Right     There were no vitals filed for this visit.   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 Yes    Pain Score 4     Pain Location Hip    Pain Orientation Left    Pain Descriptors / Indicators Aching;Sharp    Pain Type Chronic pain    Pain Onset More than a month ago    Pain Frequency Constant                   ADULT SLP TREATMENT - 01/14/21 1115       General Information   Behavior/Cognition Alert;Cooperative;Pleasant mood      Treatment Provided   Treatment provided Cognitive-Linquistic      Cognitive-Linquistic Treatment   Treatment focused on Dysarthria    Skilled Treatment Pt completed loud /a/ today with average mid 80's with initial cues for using his breath to point of feeling like he needs to take another breath. SLP told pt to complete 10 loud /a/ BID, as instructed previous session. Notably, pt denied doing loud /a/ during ST evaluation,  after SLP demonstrated this task. SLP introduced concept of everyday sentences and had pt think of two of these - he wsa instructed to write down/wife write down 8 more prior to next session and to bring these back for SLP to see and enter into pt's chart. Today pt provided word level answers with average low-mid 70s dB, and phrase level responses with low 70s dB average, however conversation between tasks and stimuli revealed no carryover of WNL volume speech.      Assessment / Recommendations / Plan   Plan Continue with current plan of care      Progression Toward Goals   Progression toward goals Progressing toward goals              SLP Education - 01/14/21 1132     Education Details loud /a/, everyday sentences    Person(s) Educated Patient    Methods Explanation;Demonstration;Verbal cues;Handout    Comprehension Verbalized understanding;Returned demonstration;Verbal cues required;Need further instruction              SLP Short Term Goals - 01/14/21 1658       SLP SHORT TERM GOAL #1  Title Pt will complete dysarthria HEP given occasional min A over 2 sessions    Time 4    Period Weeks    Status On-going    Target Date 01/31/21      SLP SHORT TERM GOAL #2   Title Pt will produce loud /a/ or "hey!" with at least low 90s dB average over three sessions    Time 4    Period Weeks    Status On-going    Target Date 01/31/21      SLP SHORT TERM GOAL #3   Title Pt will demo abdominal breathing at rest and during sentence responses with 80% accuracy given occasional min A over 2 sessions    Time 4    Period Weeks    Status On-going    Target Date 01/31/21      SLP SHORT TERM GOAL #4   Title Pt will maintain average of 70 DB in Q and A using compensations given occasional min A across 3 sessions    Time 4    Period Weeks    Status On-going    Target Date 01/31/21      SLP SHORT TERM GOAL #5   Title Pt will demonstrate dysarthria compensations in 15 minute mod  complex conversation with occasional min A over 2 sessions    Time 4    Period Weeks    Status On-going    Target Date 01/31/21              SLP Long Term Goals - 01/14/21 1658       SLP LONG TERM GOAL #1   Title Pt will complete dysarthria HEP given rare min A over 2 sessions    Time 8    Period Weeks    Status On-going    Target Date 02/28/21      SLP LONG TERM GOAL #2   Title Pt will maintain average of 70 DB in 30 conversation with rare min A across 3 sessions    Time 12    Period Weeks    Status On-going    Target Date 03/28/21      SLP LONG TERM GOAL #3   Title Pt will present 3 sermons with WNL volume given rare min A for compensations    Time 12    Period Weeks    Status On-going    Target Date 03/28/21      SLP LONG TERM GOAL #4   Title SLP will assess cognitive linguistic functioning if needed during POC (goals added if needed)    Time 12    Period Weeks    Status On-going              Plan - 01/14/21 1133     Clinical Impression Statement "Alexander Thomas" was referred for OPST to address 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 Pt was unaccompanied today. Conversational speech faded from min 60s dB to low 70s dB. Pt is currently an active pastor and is reportedly compensating independently by raising voice and maintaining it over 30 minute sermons. Some episodes of reduced recall noted in discussion of personal information. SLP to further assess cognitive linguistic skills as needed during POC. 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    Speech Therapy Frequency 2x / week    Duration 12 weeks   12 weeks  to account for scheduling and missed visits   Treatment/Interventions Compensatory strategies;Functional tasks;Patient/family education;Compensatory  techniques;Internal/external aids;SLP instruction and feedback    Potential to Achieve Goals Good    SLP Home Exercise Plan loud /a/    Consulted and Agree with Plan of Care Patient             Patient will benefit from skilled therapeutic intervention in order to improve the following deficits and impairments:   Dysarthria and anarthria  Cognitive communication deficit    Problem List Patient Active Problem List   Diagnosis Date Noted   Gait abnormality 08/29/2019   Midline low back pain without sciatica 08/29/2019   Mild cognitive impairment 08/29/2019   Excessive daytime sleepiness 01/24/2019   Nocturia more than twice per night 01/24/2019   Hypersomnia with sleep apnea 01/24/2019   At risk for central sleep apnea 01/24/2019   RLS (restless legs syndrome) 01/24/2019   Idiopathic Parkinson's disease ([place]) 05/07/2015   Polymyositis ([place]) 05/07/2015    Trevante Tennell ,MS, CCC-SLP  01/14/2021, 4:59 PM  [facility] [address] [place], [place], [address] Phone: [phone]   Fax:  [phone]   Name: Alexander Thomas MRN: [id number] Date of Birth: September 30, 1945

## 2021-01-14 NOTE — Patient Instructions (Signed)
Access Code: [email or web address] URL: [email or web address] Date: 01/14/2021 Prepared by: Mady Haagensen  Exercises Seated [place] - 1 x daily - 5 x weekly - 2 sets - 10 reps   Also seated PWR! Up x 20 reps, seated PWR! Rock x 10 reps, 2 sets

## 2021-01-14 NOTE — Patient Instructions (Addendum)
  You did very well with your loud "ah" today!  Do TEN of these, TWICE A DAY!  Next, think of 8 more sentences you say EVERY DAY. Write them, or have your wife write them, below. Bring this back next time so we can put it into your chart.  1) Did you take the dog out?  2) Did you sleep well?  3)  4)  5)  6)  7)  8)  9)  10)

## 2021-01-14 NOTE — Patient Instructions (Addendum)
           PWR! Hand Exercises  Then, start with elbows bent and hands closed:  PWR! Hands: Push hands out BIG. Elbows straight, wrists up, fingers open and spread apart BIG.   (Also can perform PWR! Hands throughout the day when you are having trouble using your hands (picking up/manipulating small objects, writing, eating, typing, sewing, buttoning, etc.).   PWR! Step: Touch index finger to thumb while keeping other fingers straight. Flick fingers out BIG (thumb out/straighten fingers). Repeat with other fingers. (Step your thumb to each finger).   With arms stretched out in front of you (elbows straight), perform the following:  PWR! Rock:  Move wrists up and down [employer]! Twist: Twist palms up and down BIG    ** Make each movement big and deliberate so that you feel the movement.  Perform at least 10 repetitions of each 1x/day.

## 2021-01-14 NOTE — Therapy (Signed)
[facility] [address] [place], [place], [address] Phone: [phone]   Fax:  [phone]  Physical Therapy Treatment  Patient Details  Name: Alexander Thomas MRN: [id number] Date of Birth: 02-21-1946 Referring Provider (PT): Dr. Krista Blue   Encounter Date: 01/14/2021   PT End of Session - 01/14/21 0940     Visit Number 2    Number of Visits 17    Date for PT Re-Evaluation 03/31/21    Authorization Type Medicare- part A & B    PT Start Time 0935    PT Stop Time 1015    PT Time Calculation (min) 40 min    Equipment Utilized During Treatment --    Activity Tolerance Patient tolerated treatment well    Behavior During Therapy WFL for tasks assessed/performed             Past Medical History:  Diagnosis Date   Hypercholesteremia    Migraine    Polymyositis ([place])    Raynaud disease    Tremor of right hand     Past Surgical History:  Procedure Laterality Date   CARPAL TUNNEL RELEASE Left    INGUINAL HERNIA REPAIR Bilateral    WRIST SURGERY Right     There were no vitals filed for this visit.   Subjective Assessment - 01/14/21 0939     Subjective Sometimes use the walker in the house.  No falls or stumbles.    Pertinent History PD, inflammatory polymyositis, Raynaud's, CTR LUE, R wrist surgery, back pain;  L hip pain    Limitations Walking;House hold activities    Patient Stated Goals wants to know how much he might have slipped from last bout of therapy, wants to work on his problem areas    Currently in Pain? Yes    Pain Score 4     Pain Location Hip    Pain Orientation Left    Pain Type Chronic pain    Pain Onset More than a month ago    Pain Frequency Constant    Aggravating Factors  unknown    Pain Relieving Factors medications, exercises                               [facility] Adult PT Treatment/Exercise - 01/14/21 0001       Transfers   Transfers Sit to Stand;Stand to Sit     Sit to Stand 5: Supervision;Without upper extremity assist   from mat   Sit to Stand Details Verbal cues for sequencing;Verbal cues for technique    Sit to Stand Details (indicate cue type and reason) Cues to scoot to edge of mat/chair and to increase forward lean from lower surface    Stand to Sit 5: Supervision;Without upper extremity assist    Comments Additional sit to stand from 16" chair height, cues for forward scooting and increased forward lean, with min guard.      Ambulation/Gait   Ambulation/Gait Yes    Ambulation/Gait Assistance 5: Supervision    Ambulation/Gait Assistance Details Pt walks into clinic using cane.  Utilized RW during session for walking activity.    Ambulation Distance (Feet) 200 Feet   RW; 80 ft cane   Assistive device Straight cane    Gait Pattern Step-through pattern;Step-to pattern;Decreased arm swing - left;Decreased stride length;Decreased hip/knee flexion - right;Decreased hip/knee flexion - left;Decreased dorsiflexion - left;Decreased dorsiflexion - right;Left foot flat;Right foot flat;Shuffle;Decreased trunk rotation;Trunk flexed  Ambulation Surface Indoor    Gait Comments Gait with RW today, to work on posture and step length.  PT provides cues for taking occasional standing rest break for posture reset.  Cues for increased step length/foot clearance.  Discussed that we may be working on walking program for home in future sessions using RW to encourage better posture, step length.             Pt performs PWR! Moves in sitting at edge of mat:  PWR! UP x 20 reps, cues for smooth forward lean and scapular retraction to upright posture  PWR! Rock x 10 reps each side, rocking through hips initially, then added reach with arms.  Limitations with kicking leg out, so only focused on rocking through hips and reaching at this time.  PWR! Step (modified for seated marching in place), 2 sets x 10 reps, as attempt with step out/in creates increased pain L  hip.          PT Education - 01/14/21 1217     Education Details HEP initiated-see instructions    Person(s) Educated Patient    Methods Explanation;Demonstration;Verbal cues;Handout    Comprehension Verbalized understanding;Returned demonstration;Verbal cues required;Need further instruction              PT Short Term Goals - 12/31/20 1240       PT SHORT TERM GOAL #1   Title Pt will perform initial HEP with wife's supervision in order to build upon functional gains made in therapy. ALL STGS DUE 01/28/21    Time 4    Period Weeks    Status New    Target Date 01/28/21      PT SHORT TERM GOAL #2   Title Pt will verbalize understanding of fall prevention in the home environment.    Time 4    Period Weeks    Status New      PT SHORT TERM GOAL #3   Title Pt will improve 5x sit <> stand time without UE support to 20 seconds or less in order to demo improved balance/functional strength for transfers.    Baseline 24.56 seconds    Time 4    Period Weeks    Status New      PT SHORT TERM GOAL #4   Title Pt will improve gait speed with SPC vs. LRAD to at least 2.4 ft/sec in order to demo improved community mobility.    Baseline 2.11 ft/sec with SPC (pt holding cane at times)    Time 4    Period Weeks    Status New               PT Long Term Goals - 12/31/20 1243       PT LONG TERM GOAL #1   Title Pt will perform progression of  HEP with wife's cues/supervision, for improved strength, balance, transfers, and gait. ALL LTGS DUE 02/25/21    Time 8    Period Weeks    Status New    Target Date 02/25/21      PT LONG TERM GOAL #2   Title Pt will improve 5x sit<>stand to less than or equal to 16.5 seconds for improved transfer efficiency and safety.    Baseline 24.56 seconds    Time 8    Period Weeks    Status New      PT LONG TERM GOAL #3   Title Pt will decr cog TUG score to 25 seconds or less in order  to demo decr fall risk/improved dual tasking.    Baseline  36.81 seconds with SPC    Time 8    Period Weeks    Status New      PT LONG TERM GOAL #4   Title Pt will improve gait velocity to at least 2.7 ft/sec for improved gait efficiency and safety.    Baseline 2.11 ft/sec    Time 8    Period Weeks    Status New      PT LONG TERM GOAL #5   Title Pt will verbalize understanding of local Parkinson's disease resources.    Time 8    Period Weeks    Status New      Additional Long Term Goals   Additional Long Term Goals Yes      PT LONG TERM GOAL #6   Title Pt will recover posterior and anterior balance in push and release test in 2 or less steps independently, for improved balance recovery    Baseline anterior and posterior = 4 small, shuffled steps, needing min A in anterior direction    Time 8    Period Weeks    Status New                   Plan - 01/14/21 1218     Clinical Impression Statement Initiated HEP this visit, for seated PWR! Moves (only PWR! Up and [place], as [place]! Step increases L hip pain and did not perform PWR! Twist to avoid shearing /twist at L hip; may modify this in future sessions.)  Also addressed sit<>stand and gait with RW; discussed using RW as a means for walking with exercise to reinforce posture, step length.  Pt open to this idea and will work to initiate walking program next visit.    Personal Factors and Comorbidities Comorbidity 3+;Time since onset of injury/illness/exacerbation;Past/Current Experience    Comorbidities PD, inflammatory polymyositis, Raynaud's, CTR LUE, R wrist surgery, back pain; L hip fracture 2/22 from fall, hx of L elbow fx due to fall 2/22 (surgery x2)    Examination-Activity Limitations Stand;Stairs;Transfers;Squat;Locomotion Level    Examination-Participation Restrictions Medication Management;Community Activity;Yard Work    [employer] Evolving/Moderate complexity    Rehab Potential Good    PT Frequency 2x / week    PT Duration 12 weeks    PT  Treatment/Interventions ADLs/Self Care Home Management;DME Instruction;Gait training;Therapeutic activities;Stair training;Therapeutic exercise;Balance training;Contrast Bath;Neuromuscular re-education;Patient/family education;Functional mobility training;Vestibular;Energy conservation    PT Next Visit Plan Walking program for home, using RW.  Review HEP given this visit and make adjustments to his previous HEP as appropriate, begin with seated PWR moves (unsure about PWR! Twist due to hx of hip/pelvis fracture). sit <> stand training. gait training with SPC with tall posture/foot clearance - vs. determining which AD would be best for patient. standing balance. discuss seeing if his wife can come in to some future appts for incr carryover at home.    Consulted and Agree with Plan of Care Patient             Patient will benefit from skilled therapeutic intervention in order to improve the following deficits and impairments:  Abnormal gait, Decreased activity tolerance, Decreased balance, Decreased coordination, Decreased endurance, Decreased mobility, Decreased strength, Difficulty walking, Impaired flexibility, Postural dysfunction, Pain  Visit Diagnosis: Abnormal posture  Unsteadiness on feet  Other abnormalities of gait and mobility     Problem List Patient Active Problem List   Diagnosis Date Noted  Gait abnormality 08/29/2019   Midline low back pain without sciatica 08/29/2019   Mild cognitive impairment 08/29/2019   Excessive daytime sleepiness 01/24/2019   Nocturia more than twice per night 01/24/2019   Hypersomnia with sleep apnea 01/24/2019   At risk for central sleep apnea 01/24/2019   RLS (restless legs syndrome) 01/24/2019   Idiopathic Parkinson's disease ([place]) 05/07/2015   Polymyositis ([place]) 05/07/2015    Alexander Thomas W., PT 01/14/2021, 12:23 PM  [facility] [address] [place], [place],  [address] Phone: [phone]   Fax:  [phone]  Name: Alexander Thomas MRN: [id number] Date of Birth: [birth year]

## 2021-01-14 NOTE — Therapy (Signed)
[facility] [address] [place], [place], [address] Phone: [phone]   Fax:  [phone]  Occupational Therapy Treatment  Patient Details  Name: CLARKSON ROSSELLI MRN: [id number] Date of Birth: 1946/03/19 Referring Provider (OT): Dr. Marcial Pacas   Encounter Date: 01/14/2021   OT End of Session - 01/14/21 1019     Visit Number 2    Number of Visits 25    Date for OT Re-Evaluation 03/31/21    Authorization Type Medicare & BCBS--Covered 100%.  cert. period 12/31/20-03/31/21    Authorization - Visit Number 2    Authorization - Number of Visits 10    Progress Note Due on Visit 10    OT Start Time 1018    OT Stop Time 1058    OT Time Calculation (min) 40 min    Activity Tolerance Patient tolerated treatment well    Behavior During Therapy WFL for tasks assessed/performed             Past Medical History:  Diagnosis Date   Hypercholesteremia    Migraine    Polymyositis ([facility])    Raynaud disease    Tremor of right hand     Past Surgical History:  Procedure Laterality Date   CARPAL TUNNEL RELEASE Left    INGUINAL HERNIA REPAIR Bilateral    WRIST SURGERY Right     There were no vitals filed for this visit.   Subjective Assessment - 01/14/21 1019     Subjective  I just have to be consistent with my exercises    Pertinent History Parkinson's Disease.  PMH:  hx of polymyositis, hx of orthostatic tachycardia, memory, Raynaud's disease, L CTR, R wrist fx surgery, L hip fracture 2/22 from fall, hx of L elbow fx due to fall 2/22 (surgery x2), sleep apnea, restless leg syndrome, hx of back pain, mild cognitive impairment.    Limitations fall risk    Patient Stated Goals move better    Currently in Pain? Yes    Pain Score 4     Pain Location Hip    Pain Orientation Left    Pain Descriptors / Indicators Aching;Sharp    Pain Type Chronic pain    Pain Onset More than a month ago    Pain Frequency Constant     Aggravating Factors  unknown    Pain Relieving Factors medications, exercises                     OT Education - 01/14/21 1024     Education Details Supine PWR! moves (basic 4)--with modifications (knees bent for all, PWR! step with bridges x10 followed by 10 step out with each LE).  PWR! hands (basic 4) and using wt. bearing and PWR! hands to help with tremor/functional tasks.  Large amplitude movement strategies for supine>sitting transfer with min cueing.    Person(s) Educated Patient    Methods Explanation;Demonstration;Verbal cues;Handout;Tactile cues    Comprehension Verbalized understanding;Returned demonstration;Verbal cues required              OT Short Term Goals - 12/31/20 1505       OT SHORT TERM GOAL #1   Title Pt will be independent with PD-specific HEP-- check STGs 01/29/21    Time 4    Period Weeks    Status New      OT SHORT TERM GOAL #2   Title Pt will verbalize understanding of ways to decr risk of complications related to PD.  Time 4    Period Weeks    Status New      OT SHORT TERM GOAL #3   Title Pt will improve ability to use utensils for eating and shown by improving time on PPT#2 by at least 4sec.    Baseline 24sec    Time 4    Period Weeks    Status New      OT SHORT TERM GOAL #4   Title Pt will improve functional reaching/coordination for ADLs/IADLs as shown by improving score on box and blocks test by at least 3 bilaterally.    Baseline R-29 blocks, L-27 blocks    Time 4    Period Weeks    Status New      OT SHORT TERM GOAL #5   Title Pt will verbalize understanding of memory compensation strategies and ways to keep thinking skills sharp.    Time 4    Period Weeks    Status New      OT SHORT TERM GOAL #6   Title Pt will be able to write at least 4 sentences with good legibility and only min decr in size.    Time 4    Period Weeks    Status New               OT Long Term Goals - 12/31/20 1514       OT LONG  TERM GOAL #1   Title Pt will verbalize understanding of adaptive strategies/AE to incr ease/safety with ADLs/IADLs.--check LTGs 03/31/21    Time 12    Period Weeks    Status New      OT LONG TERM GOAL #2   Title Pt will demo at least 110* shoulder flex with at least -50* elbow ext for improved overhead reaching with each UE.    Baseline R shoulder flex 100* with -55* elbow ext, L shoulder flex 105* with -60* elbow ext    Time 12    Period Weeks    Status New      OT LONG TERM GOAL #3   Title Pt will verbalize understanding of appropriate community resources/continuing fitness opportunities related to PD.    Time 12    Period Weeks    Status New      OT LONG TERM GOAL #4   Title Pt will be able to fastening/unfastening buttons in 57sec or less.    Baseline 67sec    Time 12    Period Weeks    Status New      OT LONG TERM GOAL #5   Title Pt will be able to don/doff jacket mod I in 31min or less.    Baseline unable/needs assist    Time 12    Period Weeks    Status New      OT LONG TERM GOAL #6   Title Pt will improve bilateral hand coordination for ADLs as shown by improving time on 9-hole peg test by at least 5sec bilaterally.    Baseline R-42.10sec, L-48.19sec    Time 12    Period Weeks    Status New                   Plan - 01/14/21 1020     Clinical Impression Statement Pt responds well to cueing for incr movement amplitude for PWR! moves and repetition.    OT Occupational Profile and History Detailed Assessment- Review of Records and additional review of physical, cognitive, psychosocial history related  to current functional performance    Occupational performance deficits (Please refer to evaluation for details): ADL's;IADL's;Work;Leisure;Social Participation    Body Structure / Function / Physical Skills ADL;Decreased knowledge of use of DME;Balance;Dexterity;Tone;UE functional use;IADL;ROM;Mobility;Coordination;Flexibility;Decreased knowledge of  precautions;FMC;Improper spinal/pelvic alignment   bradykinesia   Cognitive Skills Attention;Memory    Rehab Potential Good    Clinical Decision Making Several treatment options, min-mod task modification necessary    Comorbidities Affecting Occupational Performance: May have comorbidities impacting occupational performance    Modification or Assistance to Complete Evaluation  Min-Moderate modification of tasks or assist with assess necessary to complete eval    OT Frequency 2x / week    OT Duration 12 weeks   +eval, may d/c after 8wks depending on progress   OT Treatment/Interventions Self-care/ADL training;DME and/or AE instruction;Balance training;Therapeutic activities;Aquatic Therapy;Therapeutic exercise;Cognitive remediation/compensation;Neuromuscular education;Functional Mobility Training;Passive range of motion;Patient/family education;Manual Therapy;Energy conservation;Moist Heat    Plan review PWR! hands, coordination HEP    Consulted and Agree with Plan of Care Patient             Patient will benefit from skilled therapeutic intervention in order to improve the following deficits and impairments:   Body Structure / Function / Physical Skills: ADL, Decreased knowledge of use of DME, Balance, Dexterity, Tone, UE functional use, IADL, ROM, Mobility, Coordination, Flexibility, Decreased knowledge of precautions, FMC, Improper spinal/pelvic alignment (bradykinesia) Cognitive Skills: Attention, Memory     Visit Diagnosis: Other symptoms and signs involving the nervous system  Other symptoms and signs involving the musculoskeletal system  Other lack of coordination  Stiffness of right shoulder, not elsewhere classified  Stiffness of left shoulder, not elsewhere classified  Stiffness of right elbow, not elsewhere classified  Stiffness of left elbow, not elsewhere classified  Abnormal posture  Tremor  Unsteadiness on feet  Other abnormalities of gait and  mobility    Problem List Patient Active Problem List   Diagnosis Date Noted   Gait abnormality 08/29/2019   Midline low back pain without sciatica 08/29/2019   Mild cognitive impairment 08/29/2019   Excessive daytime sleepiness 01/24/2019   Nocturia more than twice per night 01/24/2019   Hypersomnia with sleep apnea 01/24/2019   At risk for central sleep apnea 01/24/2019   RLS (restless legs syndrome) 01/24/2019   Idiopathic Parkinson's disease ([place]) 05/07/2015   Polymyositis ([place]) 05/07/2015    Cathan Gearin, OT/L 01/14/2021, 11:41 AM  [facility] [address] [place], [place], [address] Phone: [phone]   Fax:  [phone]  Name: JOVONTE COMMINS MRN: [id number] Date of Birth: 1945-07-25  [name], OTR/L [facility] [address] [place], [place]  [address] [phone] phone [phone] 01/14/21 11:41 AM

## 2021-01-20 ENCOUNTER — Ambulatory Visit: Payer: Medicare Other | Attending: Neurology | Admitting: Occupational Therapy

## 2021-01-20 ENCOUNTER — Ambulatory Visit: Payer: Medicare Other | Admitting: Physical Therapy

## 2021-01-20 ENCOUNTER — Ambulatory Visit: Payer: Medicare Other

## 2021-01-20 DIAGNOSIS — R2681 Unsteadiness on feet: Secondary | ICD-10-CM | POA: Insufficient documentation

## 2021-01-20 DIAGNOSIS — M6281 Muscle weakness (generalized): Secondary | ICD-10-CM | POA: Insufficient documentation

## 2021-01-20 DIAGNOSIS — M25611 Stiffness of right shoulder, not elsewhere classified: Secondary | ICD-10-CM | POA: Insufficient documentation

## 2021-01-20 DIAGNOSIS — R29898 Other symptoms and signs involving the musculoskeletal system: Secondary | ICD-10-CM | POA: Insufficient documentation

## 2021-01-20 DIAGNOSIS — R293 Abnormal posture: Secondary | ICD-10-CM | POA: Insufficient documentation

## 2021-01-20 DIAGNOSIS — R278 Other lack of coordination: Secondary | ICD-10-CM | POA: Insufficient documentation

## 2021-01-20 DIAGNOSIS — R471 Dysarthria and anarthria: Secondary | ICD-10-CM | POA: Insufficient documentation

## 2021-01-20 DIAGNOSIS — M25622 Stiffness of left elbow, not elsewhere classified: Secondary | ICD-10-CM | POA: Insufficient documentation

## 2021-01-20 DIAGNOSIS — R2689 Other abnormalities of gait and mobility: Secondary | ICD-10-CM | POA: Insufficient documentation

## 2021-01-20 DIAGNOSIS — M25612 Stiffness of left shoulder, not elsewhere classified: Secondary | ICD-10-CM | POA: Insufficient documentation

## 2021-01-20 DIAGNOSIS — R251 Tremor, unspecified: Secondary | ICD-10-CM | POA: Insufficient documentation

## 2021-01-20 DIAGNOSIS — R29818 Other symptoms and signs involving the nervous system: Secondary | ICD-10-CM | POA: Insufficient documentation

## 2021-01-20 DIAGNOSIS — R41841 Cognitive communication deficit: Secondary | ICD-10-CM | POA: Insufficient documentation

## 2021-01-20 DIAGNOSIS — M25621 Stiffness of right elbow, not elsewhere classified: Secondary | ICD-10-CM | POA: Insufficient documentation

## 2021-01-23 ENCOUNTER — Encounter: Payer: Medicare Other | Admitting: Occupational Therapy

## 2021-01-23 ENCOUNTER — Ambulatory Visit: Payer: Medicare Other | Admitting: Physical Therapy

## 2021-01-28 ENCOUNTER — Other Ambulatory Visit: Payer: Self-pay

## 2021-01-28 ENCOUNTER — Ambulatory Visit: Payer: Medicare Other | Admitting: Physical Therapy

## 2021-01-28 ENCOUNTER — Ambulatory Visit: Payer: Medicare Other

## 2021-01-28 DIAGNOSIS — R2681 Unsteadiness on feet: Secondary | ICD-10-CM

## 2021-01-28 DIAGNOSIS — R251 Tremor, unspecified: Secondary | ICD-10-CM | POA: Diagnosis not present

## 2021-01-28 DIAGNOSIS — R41841 Cognitive communication deficit: Secondary | ICD-10-CM | POA: Diagnosis not present

## 2021-01-28 DIAGNOSIS — R471 Dysarthria and anarthria: Secondary | ICD-10-CM

## 2021-01-28 DIAGNOSIS — R293 Abnormal posture: Secondary | ICD-10-CM | POA: Diagnosis not present

## 2021-01-28 DIAGNOSIS — M25612 Stiffness of left shoulder, not elsewhere classified: Secondary | ICD-10-CM | POA: Diagnosis not present

## 2021-01-28 DIAGNOSIS — R2689 Other abnormalities of gait and mobility: Secondary | ICD-10-CM | POA: Diagnosis not present

## 2021-01-28 DIAGNOSIS — M6281 Muscle weakness (generalized): Secondary | ICD-10-CM | POA: Diagnosis not present

## 2021-01-28 DIAGNOSIS — M25622 Stiffness of left elbow, not elsewhere classified: Secondary | ICD-10-CM | POA: Diagnosis not present

## 2021-01-28 DIAGNOSIS — R278 Other lack of coordination: Secondary | ICD-10-CM | POA: Diagnosis not present

## 2021-01-28 DIAGNOSIS — R29818 Other symptoms and signs involving the nervous system: Secondary | ICD-10-CM | POA: Diagnosis not present

## 2021-01-28 DIAGNOSIS — M25621 Stiffness of right elbow, not elsewhere classified: Secondary | ICD-10-CM | POA: Diagnosis not present

## 2021-01-28 DIAGNOSIS — M25611 Stiffness of right shoulder, not elsewhere classified: Secondary | ICD-10-CM | POA: Diagnosis not present

## 2021-01-28 DIAGNOSIS — R29898 Other symptoms and signs involving the musculoskeletal system: Secondary | ICD-10-CM | POA: Diagnosis not present

## 2021-01-28 NOTE — Therapy (Addendum)
[facility] [address] [place], [place], [address] Phone: [phone]   Fax:  [phone]  Physical Therapy Treatment  Patient Details  Name: Alexander [place] MRN: [id number] Date of Birth: 02/14/1946 Referring Provider (PT): Dr. Krista Blue   Encounter Date: 01/28/2021   PT End of Session - 01/28/21 1147     Visit Number 3    Number of Visits 17    Date for PT Re-Evaluation 03/31/21    Authorization Type Medicare- part A & B    PT Start Time 1146    PT Stop Time 1228    PT Time Calculation (min) 42 min    Activity Tolerance Patient tolerated treatment well    Behavior During Therapy [facility] for tasks assessed/performed             Past Medical History:  Diagnosis Date   Hypercholesteremia    Migraine    Polymyositis ([place])    Raynaud disease    Tremor of right hand     Past Surgical History:  Procedure Laterality Date   CARPAL TUNNEL RELEASE Left    INGUINAL HERNIA REPAIR Bilateral    WRIST SURGERY Right     There were no vitals filed for this visit.   Subjective Assessment - 01/28/21 1148     Subjective No falls. Has been doing some of the seated PWR moves.    Pertinent History PD, inflammatory polymyositis, Raynaud's, CTR LUE, R wrist surgery, back pain;  L hip pain    Limitations Walking;House hold activities    Patient Stated Goals wants to know how much he might have slipped from last bout of therapy, wants to work on his problem areas    Currently in Pain? Yes    Pain Score 3     Pain Location Hip    Pain Orientation Left    Pain Descriptors / Indicators Aching;Sharp    Pain Type Chronic pain    Pain Onset More than a month ago    Aggravating Factors  nothing    Pain Relieving Factors drinking water                               [facility] Adult PT Treatment/Exercise - 01/28/21 1203       Transfers   Transfers Sit to Stand;Stand to Sit    Sit to Stand 5:  Supervision;Without upper extremity assist    Sit to Stand Details Verbal cues for sequencing;Verbal cues for technique    Stand to Sit 5: Supervision;Without upper extremity assist    Comments x10 reps from mat table with no UE support, cues for incr forward lean and tall posture in standing      Ambulation/Gait   Ambulation/Gait Yes    Ambulation/Gait Assistance 5: Supervision    Ambulation/Gait Assistance Details Pt walks into clinic with [facility], worked on gait in clinic with RW. Cues for posture, incr stride length and staying inside RW. Took intermittent standing breaks to reset with tall posture before continuing with incr step length. Discussed walking program for home with RW - working on tall posture/big steps (see pt instructions).    Ambulation Distance (Feet) 115 Feet   x2, 230 x 1   Assistive device Rolling walker    Gait Pattern Step-through pattern;Step-to pattern;Decreased arm swing - left;Decreased stride length;Decreased hip/knee flexion - right;Decreased hip/knee flexion - left;Decreased dorsiflexion - left;Decreased dorsiflexion - right;Left foot flat;Right foot flat;Shuffle;Decreased  trunk rotation;Trunk flexed    Ambulation Surface Level;Indoor                Pt performs PWR! Moves in sitting at edge of mat:   PWR! UP x 20 reps, cues for smooth forward lean, L elbow extension and scapular retraction to upright posture. Cues for counting out loud when performing   PWR! Rock x 10 reps each side, rocking through hips initially, then added reach with arms.  Cues for open hands.    PWR! Step (modified for seated marching in place), 2 sets x 10 reps, per previous session - step out/in creates increased pain L hip.        PT Education - 01/28/21 1230     Education Details Walking program (see patient instructions). Having pt's wife come back next time to reschedule appts as pt can only do Monday/Tuesdays    Person(s) Educated Patient    Methods  Explanation;Demonstration;Handout;Verbal cues    Comprehension Verbalized understanding;Returned demonstration              PT Short Term Goals - 12/31/20 1240       PT SHORT TERM GOAL #1   Title Pt will perform initial HEP with wife's supervision in order to build upon functional gains made in therapy. ALL STGS DUE 01/28/21    Time 4    Period Weeks    Status New    Target Date 01/28/21      PT SHORT TERM GOAL #2   Title Pt will verbalize understanding of fall prevention in the home environment.    Time 4    Period Weeks    Status New      PT SHORT TERM GOAL #3   Title Pt will improve 5x sit <> stand time without UE support to 20 seconds or less in order to demo improved balance/functional strength for transfers.    Baseline 24.56 seconds    Time 4    Period Weeks    Status New      PT SHORT TERM GOAL #4   Title Pt will improve gait speed with SPC vs. LRAD to at least 2.4 ft/sec in order to demo improved community mobility.    Baseline 2.11 ft/sec with SPC (pt holding cane at times)    Time 4    Period Weeks    Status New               PT Long Term Goals - 12/31/20 1243       PT LONG TERM GOAL #1   Title Pt will perform progression of  HEP with wife's cues/supervision, for improved strength, balance, transfers, and gait. ALL LTGS DUE 02/25/21    Time 8    Period Weeks    Status New    Target Date 02/25/21      PT LONG TERM GOAL #2   Title Pt will improve 5x sit<>stand to less than or equal to 16.5 seconds for improved transfer efficiency and safety.    Baseline 24.56 seconds    Time 8    Period Weeks    Status New      PT LONG TERM GOAL #3   Title Pt will decr cog TUG score to 25 seconds or less in order to demo decr fall risk/improved dual tasking.    Baseline 36.81 seconds with SPC    Time 8    Period Weeks    Status New      PT  LONG TERM GOAL #4   Title Pt will improve gait velocity to at least 2.7 ft/sec for improved gait efficiency and  safety.    Baseline 2.11 ft/sec    Time 8    Period Weeks    Status New      PT LONG TERM GOAL #5   Title Pt will verbalize understanding of local Parkinson's disease resources.    Time 8    Period Weeks    Status New      Additional Long Term Goals   Additional Long Term Goals Yes      PT LONG TERM GOAL #6   Title Pt will recover posterior and anterior balance in push and release test in 2 or less steps independently, for improved balance recovery    Baseline anterior and posterior = 4 small, shuffled steps, needing min A in anterior direction    Time 8    Period Weeks    Status New                   Plan - 01/28/21 1318     Clinical Impression Statement Reviewed seated PWR moves given from last session (with exception of PWR Twist), with pt needing intiial cues for technique/incr intensity of movement patterns. Continued gait training with RW working on tall posture and incr stride length. Added walking program for pt to perform at home with RW. Discussed pt having his wife come in to future visits for incr carryover for home.    Personal Factors and Comorbidities Comorbidity 3+;Time since onset of injury/illness/exacerbation;Past/Current Experience    Comorbidities PD, inflammatory polymyositis, Raynaud's, CTR LUE, R wrist surgery, back pain; L hip fracture 2/22 from fall, hx of L elbow fx due to fall 2/22 (surgery x2)    Examination-Activity Limitations Stand;Stairs;Transfers;Squat;Locomotion Level    Examination-Participation Restrictions Medication Management;Community Activity;Yard Work    [employer] Evolving/Moderate complexity    Rehab Potential Good    PT Frequency 2x / week    PT Duration 12 weeks    PT Treatment/Interventions ADLs/Self Care Home Management;DME Instruction;Gait training;Therapeutic activities;Stair training;Therapeutic exercise;Balance training;Contrast Bath;Neuromuscular re-education;Patient/family education;Functional  mobility training;Vestibular;Energy conservation    PT Next Visit Plan CHECK STGs (there was a delay in getting pt scheduled). make adjustments to his previous HEP as appropriate, begin with seated PWR moves. sit <> stand training. working on gait training with RW with posture/incr stride length (how was [employer] program at home?). standing balance. discuss seeing if his wife can come in to some future appts for incr carryover at home.    PT Home Exercise Plan seated PWR (no PWR! Twist due to hx of hip/pelvis fracture).    Consulted and Agree with Plan of Care Patient             Patient will benefit from skilled therapeutic intervention in order to improve the following deficits and impairments:  Abnormal gait, Decreased activity tolerance, Decreased balance, Decreased coordination, Decreased endurance, Decreased mobility, Decreased strength, Difficulty walking, Impaired flexibility, Postural dysfunction, Pain  Visit Diagnosis: Abnormal posture  Other abnormalities of gait and mobility  Unsteadiness on feet     Problem List Patient Active Problem List   Diagnosis Date Noted   Gait abnormality 08/29/2019   Midline low back pain without sciatica 08/29/2019   Mild cognitive impairment 08/29/2019   Excessive daytime sleepiness 01/24/2019   Nocturia more than twice per night 01/24/2019   Hypersomnia with sleep apnea 01/24/2019   At risk for central sleep  apnea 01/24/2019   RLS (restless legs syndrome) 01/24/2019   Idiopathic Parkinson's disease ([place]) 05/07/2015   Polymyositis ([place]) 05/07/2015    Arliss Journey, PT, DPT 01/28/2021, 1:22 PM  [facility] [address] [place], [place], [address] Phone: [phone]   Fax:  [phone]  Name: Alexander [place] MRN: [id number] Date of Birth: 1945/04/30

## 2021-01-28 NOTE — Patient Instructions (Signed)
WALKING PROGRAM FOR HOME:  Using your rolling walker at home in your house down your hallways Focus on your TALLEST POSTURE and taking BIG STEPS Take a couple standing rest breaks to reset with your tallest posture Aim to walk for 2-3 minutes, performing 2-3 times per day.

## 2021-01-28 NOTE — Patient Instructions (Addendum)
1) Focus on belly breathing (or sniff-sniff-blow) for 10 minutes, twice a day (practice in car/watching tv) 2) Do 10 loud "ahhh" twice a day  3) Read your 10 sentences twice a day   ABDOMINAL BREATHING    Shoulders down - this is a cue to relax Place your hand on your abdomen - this helps you focus on easy abdominal breath support - the best and most relaxed way to breathe Breathe in through your nose and fill your belly with air, watching your hand move outward Breathe out through your mouth and watch your belly move in. An audible "sh"  may help   Think of your belly as a balloon, when you fill with air (inhale), the balloon gets bigger. As the air goes out (exhale), the balloon deflates.  If you are having difficulty coordinating this, lay on your back with a plastic cup on your belly and repeat the above steps, watching you belly move up with inhalation and down with exhalations  Practice breathing in and out in front of a mirror, watching your belly Breathe in for a count of 5 and breathe out for a count of 5

## 2021-01-28 NOTE — Therapy (Signed)
[facility] [address] [place], [place], [address] Phone: [phone]   Fax:  [phone]  Speech Language Pathology Treatment  Patient Details  Name: [place] MRN: [id number] Date of Birth: May 05, 1945 Referring Provider (SLP): Marcial Pacas, MD   Encounter Date: 01/28/2021   End of Session - 01/28/21 1244     Visit Number 3    Number of Visits 25    Date for SLP Re-Evaluation 03/28/21    Authorization Type Medicare    SLP Start Time 1102    SLP Stop Time  1145    SLP Time Calculation (min) 43 min    Activity Tolerance Patient tolerated treatment well             Past Medical History:  Diagnosis Date   Hypercholesteremia    Migraine    Polymyositis ([facility])    Raynaud disease    Tremor of right hand     Past Surgical History:  Procedure Laterality Date   CARPAL TUNNEL RELEASE Left    INGUINAL HERNIA REPAIR Bilateral    WRIST SURGERY Right     There were no vitals filed for this visit.   Subjective Assessment - 01/28/21 1106     Subjective "I've been practicing some"    Currently in Pain? No/denies                   ADULT SLP TREATMENT - 01/28/21 1107       General Information   Behavior/Cognition Alert;Cooperative;Pleasant mood      Treatment Provided   Treatment provided Cognitive-Linquistic      Cognitive-Linquistic Treatment   Treatment focused on Dysarthria    Skilled Treatment Pt reports increased practice since last ST session. SLP re-educated HEP and frequency recommended for BID. SLP introduced abdominal breathing this session, in which pt benefited from sniff-sniff blow to achieve "deeper breath." SLP provided handout and education re: practicing abdominal breathing at home. Loud /a/ targeted today, with average of upper 70s dB achieved with usual cues to increase loudness (range: 75-81 dB). Pt read aloud functional sentences with WNL volume achieved. Pt read aloud  categories and 3 options, with volume ranging from upper 60s to low 70s dB. SLP suggested conceptualizing volume in terms of effort and turning up volume one or two notches, which was effective to increase volume. Pt reports inconsistent awareness of volume decline, although wife alerts him when he "trails off."      Assessment / Recommendations / Plan   Plan Continue with current plan of care      Progression Toward Goals   Progression toward goals Progressing toward goals              SLP Education - 01/28/21 1243     Education Details abdominal breathing, HEP BID    Person(s) Educated Patient    Methods Explanation;Demonstration;Verbal cues;Handout    Comprehension Verbalized understanding;Returned demonstration;Verbal cues required;Need further instruction              SLP Short Term Goals - 01/28/21 1115       SLP SHORT TERM GOAL #1   Title Pt will complete dysarthria HEP given occasional min A over 2 sessions    Time 4    Period Weeks    Status On-going    Target Date 01/31/21      SLP SHORT TERM GOAL #2   Title Pt will produce loud /a/ or "hey!" with at least low 90s  dB average over three sessions    Time 4    Period Weeks    Status On-going    Target Date 01/31/21      SLP SHORT TERM GOAL #3   Title Pt will demo abdominal breathing at rest and during sentence responses with 80% accuracy given occasional min A over 2 sessions    Time 4    Period Weeks    Status On-going    Target Date 01/31/21      SLP SHORT TERM GOAL #4   Title Pt will maintain average of 70 DB in Q and A using compensations given occasional min A across 3 sessions    Time 4    Period Weeks    Status On-going    Target Date 01/31/21      SLP SHORT TERM GOAL #5   Title Pt will demonstrate dysarthria compensations in 15 minute mod complex conversation with occasional min A over 2 sessions    Time 4    Period Weeks    Status On-going    Target Date 01/31/21              SLP  Long Term Goals - 01/28/21 1116       SLP LONG TERM GOAL #1   Title Pt will complete dysarthria HEP given rare min A over 2 sessions    Time 8    Period Weeks    Status On-going    Target Date 02/28/21      SLP LONG TERM GOAL #2   Title Pt will maintain average of 70 DB in 30 conversation with rare min A across 3 sessions    Time 12    Period Weeks    Status On-going    Target Date 03/28/21      SLP LONG TERM GOAL #3   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 Pt was unaccompanied today. Conversational speech ranged from low to mid 60s dB without cues. Education and training completed for implementation of dysarthria compensations and HEP. Inconsistent awareness of decline in speech volume exhibited and reported. 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    Speech Therapy Frequency 2x / week    Duration 12 weeks   12 weeks to account for scheduling and missed visits   Treatment/Interventions Compensatory strategies;Functional tasks;Patient/family education;Compensatory techniques;Internal/external aids;SLP instruction and feedback    Potential to Achieve Goals Good    SLP Home Exercise Plan provided    Consulted and Agree with Plan of Care Patient             Patient will benefit from skilled therapeutic intervention in  order to improve the following deficits and impairments:  Dysarthria and anarthria  Cognitive communication deficit    Problem List Patient Active Problem List   Diagnosis Date Noted   Gait abnormality 08/29/2019   Midline low back pain without sciatica 08/29/2019   Mild cognitive impairment 08/29/2019   Excessive daytime sleepiness 01/24/2019   Nocturia more than twice per night 01/24/2019   Hypersomnia with sleep apnea 01/24/2019   At risk for central sleep apnea 01/24/2019   RLS (restless legs syndrome) 01/24/2019   Idiopathic Parkinson's disease ([place]) 05/07/2015   Polymyositis ([place]) 05/07/2015    Alinda Deem, MA CCC-SLP 01/28/2021, 12:49 PM  [facility] [address] [place], [place], [address] Phone: [phone]   Fax:  [phone]   Name: Alexander [place] MRN: [id number] Date of Birth: 1946-04-04

## 2021-01-31 ENCOUNTER — Ambulatory Visit: Payer: Medicare Other

## 2021-02-03 ENCOUNTER — Other Ambulatory Visit: Payer: Self-pay

## 2021-02-03 ENCOUNTER — Ambulatory Visit: Payer: Medicare Other | Admitting: Occupational Therapy

## 2021-02-03 ENCOUNTER — Encounter: Payer: Self-pay | Admitting: Occupational Therapy

## 2021-02-03 ENCOUNTER — Ambulatory Visit: Payer: Medicare Other

## 2021-02-03 DIAGNOSIS — M25611 Stiffness of right shoulder, not elsewhere classified: Secondary | ICD-10-CM

## 2021-02-03 DIAGNOSIS — R41841 Cognitive communication deficit: Secondary | ICD-10-CM

## 2021-02-03 DIAGNOSIS — R2689 Other abnormalities of gait and mobility: Secondary | ICD-10-CM

## 2021-02-03 DIAGNOSIS — R293 Abnormal posture: Secondary | ICD-10-CM

## 2021-02-03 DIAGNOSIS — M25612 Stiffness of left shoulder, not elsewhere classified: Secondary | ICD-10-CM | POA: Diagnosis not present

## 2021-02-03 DIAGNOSIS — M25622 Stiffness of left elbow, not elsewhere classified: Secondary | ICD-10-CM

## 2021-02-03 DIAGNOSIS — R251 Tremor, unspecified: Secondary | ICD-10-CM

## 2021-02-03 DIAGNOSIS — R29898 Other symptoms and signs involving the musculoskeletal system: Secondary | ICD-10-CM

## 2021-02-03 DIAGNOSIS — R29818 Other symptoms and signs involving the nervous system: Secondary | ICD-10-CM

## 2021-02-03 DIAGNOSIS — M6281 Muscle weakness (generalized): Secondary | ICD-10-CM

## 2021-02-03 DIAGNOSIS — R471 Dysarthria and anarthria: Secondary | ICD-10-CM

## 2021-02-03 DIAGNOSIS — R278 Other lack of coordination: Secondary | ICD-10-CM

## 2021-02-03 DIAGNOSIS — R2681 Unsteadiness on feet: Secondary | ICD-10-CM

## 2021-02-03 DIAGNOSIS — M25621 Stiffness of right elbow, not elsewhere classified: Secondary | ICD-10-CM

## 2021-02-03 NOTE — Therapy (Signed)
[facility] [address] [place], [place], [address] Phone: [phone]   Fax:  [phone]  Speech Language Pathology Treatment  Patient Details  Name: Alexander [place] MRN: [id number] Date of Birth: 1945/10/27 Referring Provider (SLP): Alexander Pacas, MD   Encounter Date: 02/03/2021   End of Session - 02/03/21 1146     Visit Number 4    Number of Visits 25    Date for SLP Re-Evaluation 03/28/21    Authorization Type Medicare    SLP Start Time 1147    SLP Stop Time  1230    SLP Time Calculation (min) 43 min    Activity Tolerance Patient tolerated treatment well             Past Medical History:  Diagnosis Date   Hypercholesteremia    Migraine    Polymyositis ([facility])    Raynaud disease    Tremor of right hand     Past Surgical History:  Procedure Laterality Date   CARPAL TUNNEL RELEASE Left    INGUINAL HERNIA REPAIR Bilateral    WRIST SURGERY Right     There were no vitals filed for this visit.   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 Yes    Pain Score 3     Pain Location Hip                   ADULT SLP TREATMENT - 02/03/21 1146       General Information   Behavior/Cognition Alert;Cooperative;Pleasant mood      Treatment Provided   Treatment provided Cognitive-Linquistic      Cognitive-Linquistic Treatment   Treatment focused on Dysarthria    Skilled Treatment Pt reported having completed HEP for loud /a/ ~once a day. SLP re-educated on abdominal breathing, loud /a/, and reading practice to optimize carryover of appropriate vocal intensity. Pt verbalized understanding of targeted exercies and recommended frequency BID. Loud /a/ completed with average 90 dB with intermittent cues to maximize breath support and increase loudness. SLP targeted reading aloud, in which intermittent cues required to use  abdominal breath prior to initiating sentence. In short Q and A format, pt exhibited usual hoarseness of voice, seemingly related to poor breath support. SLP targeted abdominal breathing in isolation for ~2 minutes, in which pt exhibited inhalations via mouth by end of two minutes.      Assessment / Recommendations / Plan   Plan Continue with current plan of care      Progression Toward Goals   Progression toward goals Progressing toward goals              SLP Education - 02/03/21 1239     Education Details HEP    Person(s) Educated Patient    Methods Explanation;Demonstration;Verbal cues    Comprehension Verbalized understanding;Returned demonstration;Need further instruction              SLP Short Term Goals - 02/03/21 1202       SLP SHORT TERM GOAL #1   Title Pt will complete dysarthria HEP given occasional min A over 2 sessions    Baseline --    Time 4    Period Weeks    Status On-going    Target Date 02/17/21   extended as pt started tx 2 weeks after eval     SLP SHORT TERM GOAL #2   Title Pt  will produce loud /a/ or "hey!" with at least low 90s dB average over three sessions    Baseline 02-03-21    Time 4    Period Weeks    Status On-going    Target Date 02/17/21      SLP SHORT TERM GOAL #3   Title Pt will demo abdominal breathing at rest and during sentence responses with 80% accuracy given occasional min A over 2 sessions    Baseline --    Time 4    Period Weeks    Status On-going    Target Date 02/17/21      SLP SHORT TERM GOAL #4   Title Pt will maintain average of 70 DB in Q and A using compensations given occasional min A across 3 sessions    Time 4    Period Weeks    Status On-going    Target Date 02/17/21      SLP SHORT TERM GOAL #5   Title Pt will demonstrate dysarthria compensations in 15 minute mod complex conversation with occasional min A over 2 sessions    Time 4    Period Weeks    Status On-going    Target Date 02/17/21               SLP Long Term Goals - 02/03/21 1214       SLP LONG TERM GOAL #1   Title Pt will complete dysarthria HEP given rare min A over 2 sessions    Time 8    Period Weeks    Status On-going    Target Date 02/28/21      SLP LONG TERM GOAL #2   Title Pt will maintain average of 70 DB in 30 conversation with rare min A across 3 sessions    Time 12    Period Weeks    Status On-going    Target Date 03/28/21      SLP LONG TERM GOAL #3   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 Pt was unaccompanied today. Ongoing education and training completed for implementation of dysarthria compensations and HEP. Loud /a/ averaged 90 dB. Reading at sentence level averaged low 70s dB. In Q and A format, decline in vocal intensity and hoarseness exhibited. Inconsistent awareness of decline in speech volume exhibited and reported. 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    Speech Therapy Frequency 2x / week    Duration 12 weeks   12 weeks to account for scheduling and missed visits   Treatment/Interventions Compensatory strategies;Functional tasks;Patient/family education;Compensatory techniques;Internal/external aids;SLP instruction and feedback    Potential to Achieve Goals Good    SLP Home Exercise Plan provided    Consulted  and Agree with Plan  of Care Patient             Patient will benefit from skilled therapeutic intervention in order to improve the following deficits and impairments:   Dysarthria and anarthria  Cognitive communication deficit    Problem List Patient Active Problem List   Diagnosis Date Noted   Gait abnormality 08/29/2019   Midline low back pain without sciatica 08/29/2019   Mild cognitive impairment 08/29/2019   Excessive daytime sleepiness 01/24/2019   Nocturia more than twice per night 01/24/2019   Hypersomnia with sleep apnea 01/24/2019   At risk for central sleep apnea 01/24/2019   RLS (restless legs syndrome) 01/24/2019   Idiopathic Parkinson's disease ([place]) 05/07/2015   Polymyositis ([place]) 05/07/2015    Alinda Deem, MA CCC-SLP 02/03/2021, 12:41 PM  [facility] [address] [place], [place], [address] Phone: [phone]   Fax:  [phone]   Name: Alexander [place] MRN: [id number] Date of Birth: 29-Sep-1945

## 2021-02-03 NOTE — Patient Instructions (Addendum)
     Coordination Exercises  Perform the following exercises for 15-20 minutes 1 times per day. Perform with both hand(s). Perform using big movements.  Flipping Cards: Place deck of cards on the table. Flip cards over by opening your hand big to grasp and then turn your palm up big, opening hand fully to release. Deal cards: Hold 1/2 or whole deck in your hand. Use thumb to push card off top of deck with one big push. (Thumb back and push hard) Rotate ball with fingertips: Pick up with fingers/thumb and move as much as you can with each turn/movement (clockwise and counter-clockwise).

## 2021-02-03 NOTE — Therapy (Signed)
[facility] [address] [place], [place], [address] Phone: [phone]   Fax:  [phone]  Occupational Therapy Treatment  Patient Details  Name: Alexander Thomas MRN: [id number] Date of Birth: 1945-11-04 Referring Provider (OT): Dr. Marcial Pacas   Encounter Date: 02/03/2021   OT End of Session - 02/03/21 1114     Visit Number 3    Number of Visits 25    Date for OT Re-Evaluation 03/31/21    Authorization Type Medicare & BCBS--Covered 100%.  cert. period 12/31/20-03/31/21    Authorization - Visit Number 3    Authorization - Number of Visits 10    Progress Note Due on Visit 10    OT Start Time 1105    OT Stop Time 1145    OT Time Calculation (min) 40 min    Activity Tolerance Patient tolerated treatment well    Behavior During Therapy WFL for tasks assessed/performed             Past Medical History:  Diagnosis Date   Hypercholesteremia    Migraine    Polymyositis (HCC)    Raynaud disease    Tremor of right hand     Past Surgical History:  Procedure Laterality Date   CARPAL TUNNEL RELEASE Left    INGUINAL HERNIA REPAIR Bilateral    WRIST SURGERY Right     There were no vitals filed for this visit.   Subjective Assessment - 02/03/21 1108     Subjective  I had to get up to take pain meds last night    Pertinent History Parkinson's Disease.  PMH:  hx of polymyositis, hx of orthostatic tachycardia, memory, Raynaud's disease, L CTR, R wrist fx surgery, L hip fracture 2/22 from fall, hx of L elbow fx due to fall 2/22 (surgery x2), sleep apnea, restless leg syndrome, hx of back pain, mild cognitive impairment.    Limitations fall risk    Patient Stated Goals move better    Currently in Pain? Yes    Pain Score 4     Pain Location Hip    Pain Orientation Left    Pain Descriptors / Indicators Aching;Sharp    Pain Type Chronic pain    Pain Onset More than a month ago    Pain Frequency Constant     Aggravating Factors  nothing    Pain Relieving Factors pain meds               Pt asked about how to obtain handicapped parking placard.  Searched online and printed application forms to take to physician.           OT Education - 02/03/21 1401     Education Details Reviewed PWR! hands (basic 4)--pt needed min-mod cueing for incr movement amplitude particularly for step.  Began instruction in coordination HEP but not issued handou--see pt instructions (add to and issue next session)    Person(s) Educated Patient    Methods Explanation;Demonstration;Verbal cues;Tactile cues    Comprehension Verbalized understanding;Returned demonstration;Verbal cues required;Tactile cues required;Need further instruction              OT Short Term Goals - 12/31/20 1505       OT SHORT TERM GOAL #1   Title Pt will be independent with PD-specific HEP-- check STGs 01/29/21    Time 4    Period Weeks    Status New      OT SHORT TERM GOAL #2   Title Pt will  verbalize understanding of ways to decr risk of complications related to PD.    Time 4    Period Weeks    Status New      OT SHORT TERM GOAL #3   Title Pt will improve ability to use utensils for eating and shown by improving time on PPT#2 by at least 4sec.    Baseline 24sec    Time 4    Period Weeks    Status New      OT SHORT TERM GOAL #4   Title Pt will improve functional reaching/coordination for ADLs/IADLs as shown by improving score on box and blocks test by at least 3 bilaterally.    Baseline R-29 blocks, L-27 blocks    Time 4    Period Weeks    Status New      OT SHORT TERM GOAL #5   Title Pt will verbalize understanding of memory compensation strategies and ways to keep thinking skills sharp.    Time 4    Period Weeks    Status New      OT SHORT TERM GOAL #6   Title Pt will be able to write at least 4 sentences with good legibility and only min decr in size.    Time 4    Period Weeks    Status New                OT Long Term Goals - 12/31/20 1514       OT LONG TERM GOAL #1   Title Pt will verbalize understanding of adaptive strategies/AE to incr ease/safety with ADLs/IADLs.--check LTGs 03/31/21    Time 12    Period Weeks    Status New      OT LONG TERM GOAL #2   Title Pt will demo at least 110* shoulder flex with at least -50* elbow ext for improved overhead reaching with each UE.    Baseline R shoulder flex 100* with -55* elbow ext, L shoulder flex 105* with -60* elbow ext    Time 12    Period Weeks    Status New      OT LONG TERM GOAL #3   Title Pt will verbalize understanding of appropriate community resources/continuing fitness opportunities related to PD.    Time 12    Period Weeks    Status New      OT LONG TERM GOAL #4   Title Pt will be able to fastening/unfastening buttons in 57sec or less.    Baseline 67sec    Time 12    Period Weeks    Status New      OT LONG TERM GOAL #5   Title Pt will be able to don/doff jacket mod I in 33min or less.    Baseline unable/needs assist    Time 12    Period Weeks    Status New      OT LONG TERM GOAL #6   Title Pt will improve bilateral hand coordination for ADLs as shown by improving time on 9-hole peg test by at least 5sec bilaterally.    Baseline R-42.10sec, L-48.19sec    Time 12    Period Weeks    Status New                   Plan - 02/03/21 1359     Clinical Impression Statement Pt responds to verbal cueing for incr movement amplitude, but needed tactile cueing for significantly incr movement amplitude at times.  OT Occupational Profile and History Detailed Assessment- Review of Records and additional review of physical, cognitive, psychosocial history related to current functional performance    Occupational performance deficits (Please refer to evaluation for details): ADL's;IADL's;Work;Leisure;Social Participation    Body Structure / Function / Physical Skills ADL;Decreased knowledge of use of  DME;Balance;Dexterity;Tone;UE functional use;IADL;ROM;Mobility;Coordination;Flexibility;Decreased knowledge of precautions;FMC;Improper spinal/pelvic alignment   bradykinesia   Cognitive Skills Attention;Memory    Rehab Potential Good    Clinical Decision Making Several treatment options, min-mod task modification necessary    Comorbidities Affecting Occupational Performance: May have comorbidities impacting occupational performance    Modification or Assistance to Complete Evaluation  Min-Moderate modification of tasks or assist with assess necessary to complete eval    OT Frequency 2x / week    OT Duration 12 weeks   +eval, may d/c after 8wks depending on progress   OT Treatment/Interventions Self-care/ADL training;DME and/or AE instruction;Balance training;Therapeutic activities;Aquatic Therapy;Therapeutic exercise;Cognitive remediation/compensation;Neuromuscular education;Functional Mobility Training;Passive range of motion;Patient/family education;Manual Therapy;Energy conservation;Moist Heat    Plan add to and issue coordination HEP next session    Consulted and Agree with Plan of Care Patient             Patient will benefit from skilled therapeutic intervention in order to improve the following deficits and impairments:   Body Structure / Function / Physical Skills: ADL, Decreased knowledge of use of DME, Balance, Dexterity, Tone, UE functional use, IADL, ROM, Mobility, Coordination, Flexibility, Decreased knowledge of precautions, FMC, Improper spinal/pelvic alignment (bradykinesia) Cognitive Skills: Attention, Memory     Visit Diagnosis: Other symptoms and signs involving the musculoskeletal system  Other lack of coordination  Tremor  Stiffness of left elbow, not elsewhere classified  Stiffness of right elbow, not elsewhere classified  Stiffness of left shoulder, not elsewhere classified  Stiffness of right shoulder, not elsewhere classified  Other symptoms and signs  involving the nervous system  Abnormal posture  Other abnormalities of gait and mobility    Problem List Patient Active Problem List   Diagnosis Date Noted   Gait abnormality 08/29/2019   Midline low back pain without sciatica 08/29/2019   Mild cognitive impairment 08/29/2019   Excessive daytime sleepiness 01/24/2019   Nocturia more than twice per night 01/24/2019   Hypersomnia with sleep apnea 01/24/2019   At risk for central sleep apnea 01/24/2019   RLS (restless legs syndrome) 01/24/2019   Idiopathic Parkinson's disease ([facility]) 05/07/2015   Polymyositis ([facility]) 05/07/2015    Quantae Martel, OT/L 02/03/2021, 2:06 PM  [facility] [address] [place], [place], [address] Phone: [phone]   Fax:  [phone]  Name: Alexander Thomas MRN: [id number] Date of Birth: 1945-06-13  [name], OTR/L [facility] [address] [place], [place]  [address] [phone] phone [phone] 02/03/21 2:06 PM

## 2021-02-03 NOTE — Therapy (Signed)
[facility] [address] [place], [place], [address] Phone: [phone]   Fax:  [phone]  Physical Therapy Treatment  Patient Details  Name: Alexander Thomas MRN: [id number] Date of Birth: Jul 31, 1945 Referring Provider (PT): Dr. Krista Blue   Encounter Date: 02/03/2021   PT End of Session - 02/03/21 1233     Visit Number 4    Number of Visits 17    Date for PT Re-Evaluation 03/31/21    Authorization Type Medicare- part A & B    PT Start Time 1230    PT Stop Time 1312    PT Time Calculation (min) 42 min    Activity Tolerance Patient tolerated treatment well    Behavior During Therapy [facility] for tasks assessed/performed             Past Medical History:  Diagnosis Date   Hypercholesteremia    Migraine    Polymyositis (HCC)    Raynaud disease    Tremor of right hand     Past Surgical History:  Procedure Laterality Date   CARPAL TUNNEL RELEASE Left    INGUINAL HERNIA REPAIR Bilateral    WRIST SURGERY Right     There were no vitals filed for this visit.   Subjective Assessment - 02/03/21 1233     Subjective Pt reports he is doing ok today. Pt walked in on [facility]. He reports that he is working on walking with RW at home.    Pertinent History PD, inflammatory polymyositis, Raynaud's, CTR LUE, R wrist surgery, back pain;  L hip pain    Limitations Walking;House hold activities    Patient Stated Goals wants to know how much he might have slipped from last bout of therapy, wants to work on his problem areas    Currently in Pain? Yes    Pain Score 3     Pain Location Hip    Pain Orientation Left    Pain Descriptors / Indicators Sharp    Pain Type Chronic pain    Pain Onset More than a month ago    Pain Frequency Constant    Aggravating Factors  nothing    Pain Relieving Factors pain meds                               [facility] Adult PT Treatment/Exercise - 02/03/21 1235       Transfers    Transfers Sit to Stand;Stand to Sit    Sit to Stand 5: Supervision;4: Min guard    Sit to Stand Details Verbal cues for technique;Tactile cues for weight beaing    Five time sit to stand comments  5 x sit to stand without hands=21.83 sec from chair    Stand to Sit 5: Supervision    Stand to Sit Details (indicate cue type and reason) Verbal cues for technique    Stand to Sit Details Pt was cued to reach back and bend at hips    Comments Sit to stand x 5 from mat with working on shifting weight forward more and keeping toes down then opening chest up in Up position. Pt worked on marching 45 degree turns side to side x 6 bouts. Then performed walking with walker 10' turning around with marching and returning to mat to turn again 180 degree working on marching steps to really pick up feet.      Ambulation/Gait   Ambulation/Gait Yes  Ambulation/Gait Assistance 5: Supervision    Ambulation/Gait Assistance Details Pt was cued to increase step length. When using walker cued to keep head up and stay up in walker for more erect posture.    Ambulation Distance (Feet) 230 Feet    Assistive device Rolling walker;Straight cane    Gait Pattern Step-through pattern;Decreased step length - right;Decreased step length - left;Decreased trunk rotation;Trunk flexed    Ambulation Surface Level;Indoor    Gait velocity 19.68 sec with cane= 0.59ms or 1.639fsec, 16.95 sec with RW=0.5819mor [id number]      Self-Care   Self-Care Other Self-Care Comments    Other Self-Care Comments  PT reviewed fall prevention strategies: removing throw rugs or taking down good, grab bars, night lights, keeping pathways clear. Pt reports that his little dog likes to leave things in the pathway. Also discussed using a reacher to help get items off floor.      Neuro Re-ed    Neuro Re-ed Details  Pt reviewed seated PWR exercises as noted below per primary PWR certified PT.            Pt performs PWR! Moves in sitting at edge of  mat:   PWR! UP x 10 reps, cues for smooth forward lean, L elbow extension and scapular retraction to upright posture.    PWR! Rock x 10 reps each side, rocking through hips initially, then added reach with arms.  Cues for open hands.    PWR! Step (modified for seated marching in place),  x 10 reps with stepping out on right and only marching in place on left due to hip pain. Pt was cued to really use some force to stomp for bigger movements             PT Short Term Goals - 02/03/21 1248       PT SHORT TERM GOAL #1   Title Pt will perform initial HEP with wife's supervision in order to build upon functional gains made in therapy. ALL STGS DUE 01/28/21    Baseline 02/03/21 Pt able to perform initial HEP with supervision for some cues.    Time 4    Period Weeks    Status Achieved    Target Date 01/28/21      PT SHORT TERM GOAL #2   Title Pt will verbalize understanding of fall prevention in the home environment.    Baseline 02/03/21 Pt has been instructed in fall prevention strategies.    Time 4    Period Weeks    Status Achieved      PT SHORT TERM GOAL #3   Title Pt will improve 5x sit <> stand time without UE support to 20 seconds or less in order to demo improved balance/functional strength for transfers.    Baseline 24.56 seconds. 02/03/21 21.83 sec    Time 4    Period Weeks    Status Partially Met      PT SHORT TERM GOAL #4   Title Pt will improve gait speed with SPC vs. LRAD to at least 2.4 ft/sec in order to demo improved community mobility.    Baseline 2.11 ft/sec with SPC (pt holding cane at times). cane= 0.43m/53m 1.66ft/77f  with RW=0.59m/s 43m.68ft /s50f  Time 4    Period Weeks    Status Not Met               PT Long Term Goals - 12/31/20 1243  PT LONG TERM GOAL #1   Title Pt will perform progression of  HEP with wife's cues/supervision, for improved strength, balance, transfers, and gait. ALL LTGS DUE 02/25/21    Time 8    Period Weeks     Status New    Target Date 02/25/21      PT LONG TERM GOAL #2   Title Pt will improve 5x sit<>stand to less than or equal to 16.5 seconds for improved transfer efficiency and safety.    Baseline 24.56 seconds    Time 8    Period Weeks    Status New      PT LONG TERM GOAL #3   Title Pt will decr cog TUG score to 25 seconds or less in order to demo decr fall risk/improved dual tasking.    Baseline 36.81 seconds with SPC    Time 8    Period Weeks    Status New      PT LONG TERM GOAL #4   Title Pt will improve gait velocity to at least 2.7 ft/sec for improved gait efficiency and safety.    Baseline 2.11 ft/sec    Time 8    Period Weeks    Status New      PT LONG TERM GOAL #5   Title Pt will verbalize understanding of local Parkinson's disease resources.    Time 8    Period Weeks    Status New      Additional Long Term Goals   Additional Long Term Goals Yes      PT LONG TERM GOAL #6   Title Pt will recover posterior and anterior balance in push and release test in 2 or less steps independently, for improved balance recovery    Baseline anterior and posterior = 4 small, shuffled steps, needing min A in anterior direction    Time 8    Period Weeks    Status New                   Plan - 02/03/21 1320     Clinical Impression Statement PT assessed STGs today. Pt fully achieved 2/4 goals with partially meeting 5 x sit to stand goal. He did decrease his time to 21.83 sec. Sit to stand is inconsistent at times losing balance posterior if does not weight shift forward far enough. Pt did not increase gait speed. Was faster with RW and able to keep more upright posture with cuing. PT also focused on working on turning with more marching steps which did improve turns some. Pt will continue to benefit from skiled PT to further address strength, ROM, balance and functional mobility goals.    Personal Factors and Comorbidities Comorbidity 3+;Time since onset of  injury/illness/exacerbation;Past/Current Experience    Comorbidities PD, inflammatory polymyositis, Raynaud's, CTR LUE, R wrist surgery, back pain; L hip fracture 2/22 from fall, hx of L elbow fx due to fall 2/22 (surgery x2)    Examination-Activity Limitations Stand;Stairs;Transfers;Squat;Locomotion Level    Examination-Participation Restrictions Medication Management;Community Activity;Yard Work    [employer] Evolving/Moderate complexity    Rehab Potential Good    PT Frequency 2x / week    PT Duration 12 weeks    PT Treatment/Interventions ADLs/Self Care Home Management;DME Instruction;Gait training;Therapeutic activities;Stair training;Therapeutic exercise;Balance training;Contrast Bath;Neuromuscular re-education;Patient/family education;Functional mobility training;Vestibular;Energy conservation    PT Next Visit Plan make adjustments to his previous HEP as appropriate, begin with seated PWR moves. sit <> stand training. working on gait training with RW  with posture/incr stride length. standing balance. Continue work on turning with large marching steps. discuss seeing if his wife can come in to some future appts for incr carryover at home.    PT Home Exercise Plan seated PWR (no PWR! Twist due to hx of hip/pelvis fracture).    Consulted and Agree with Plan of Care Patient             Patient will benefit from skilled therapeutic intervention in order to improve the following deficits and impairments:  Abnormal gait, Decreased activity tolerance, Decreased balance, Decreased coordination, Decreased endurance, Decreased mobility, Decreased strength, Difficulty walking, Impaired flexibility, Postural dysfunction, Pain  Visit Diagnosis: Other abnormalities of gait and mobility  Muscle weakness (generalized)  Unsteadiness on feet     Problem List Patient Active Problem List   Diagnosis Date Noted   Gait abnormality 08/29/2019   Midline low back pain without  sciatica 08/29/2019   Mild cognitive impairment 08/29/2019   Excessive daytime sleepiness 01/24/2019   Nocturia more than twice per night 01/24/2019   Hypersomnia with sleep apnea 01/24/2019   At risk for central sleep apnea 01/24/2019   RLS (restless legs syndrome) 01/24/2019   Idiopathic Parkinson's disease ([facility] [facility]) 05/07/2015   Polymyositis ([place]) 05/07/2015    Electa Sniff, PT, DPT, NCS 02/03/2021, 1:26 PM  [facility] [address] [place], [place], [address] Phone: [phone]   Fax:  [phone]  Name: Alexander Thomas MRN: [id number] Date of Birth: 1945-07-07

## 2021-02-06 ENCOUNTER — Ambulatory Visit: Payer: Medicare Other | Admitting: Physical Therapy

## 2021-02-06 ENCOUNTER — Ambulatory Visit: Payer: Medicare Other | Admitting: Occupational Therapy

## 2021-02-06 ENCOUNTER — Ambulatory Visit: Payer: Medicare Other

## 2021-02-10 ENCOUNTER — Encounter: Payer: Self-pay | Admitting: Occupational Therapy

## 2021-02-10 ENCOUNTER — Other Ambulatory Visit: Payer: Self-pay

## 2021-02-10 ENCOUNTER — Ambulatory Visit: Payer: Medicare Other | Admitting: Physical Therapy

## 2021-02-10 ENCOUNTER — Ambulatory Visit: Payer: Medicare Other | Admitting: Occupational Therapy

## 2021-02-10 DIAGNOSIS — R2681 Unsteadiness on feet: Secondary | ICD-10-CM

## 2021-02-10 DIAGNOSIS — R293 Abnormal posture: Secondary | ICD-10-CM | POA: Diagnosis not present

## 2021-02-10 DIAGNOSIS — R278 Other lack of coordination: Secondary | ICD-10-CM | POA: Diagnosis not present

## 2021-02-10 DIAGNOSIS — R29898 Other symptoms and signs involving the musculoskeletal system: Secondary | ICD-10-CM | POA: Diagnosis not present

## 2021-02-10 DIAGNOSIS — M25621 Stiffness of right elbow, not elsewhere classified: Secondary | ICD-10-CM

## 2021-02-10 DIAGNOSIS — M25611 Stiffness of right shoulder, not elsewhere classified: Secondary | ICD-10-CM

## 2021-02-10 DIAGNOSIS — M25622 Stiffness of left elbow, not elsewhere classified: Secondary | ICD-10-CM

## 2021-02-10 DIAGNOSIS — R251 Tremor, unspecified: Secondary | ICD-10-CM

## 2021-02-10 DIAGNOSIS — R471 Dysarthria and anarthria: Secondary | ICD-10-CM | POA: Diagnosis not present

## 2021-02-10 DIAGNOSIS — R29818 Other symptoms and signs involving the nervous system: Secondary | ICD-10-CM

## 2021-02-10 DIAGNOSIS — M25612 Stiffness of left shoulder, not elsewhere classified: Secondary | ICD-10-CM

## 2021-02-10 NOTE — Therapy (Signed)
[facility] [address] [place], [place], [address] Phone: [phone]   Fax:  [phone]  Occupational Therapy Treatment  Patient Details  Name: Alexander Thomas MRN: [id number] Date of Birth: Dec 10, 1945 Referring Provider (OT): Dr. Marcial Pacas   Encounter Date: 02/10/2021   OT End of Session - 02/10/21 1411     Visit Number 4    Number of Visits 25    Date for OT Re-Evaluation 03/31/21    Authorization Type Medicare & BCBS--Covered 100%.  cert. period 12/31/20-03/31/21    Authorization - Visit Number 4    Authorization - Number of Visits 10    Progress Note Due on Visit 10    OT Start Time 1233    OT Stop Time 1315    OT Time Calculation (min) 42 min    Activity Tolerance Patient tolerated treatment well    Behavior During Therapy WFL for tasks assessed/performed             Past Medical History:  Diagnosis Date   Hypercholesteremia    Migraine    Polymyositis (HCC)    Raynaud disease    Tremor of right hand     Past Surgical History:  Procedure Laterality Date   CARPAL TUNNEL RELEASE Left    INGUINAL HERNIA REPAIR Bilateral    WRIST SURGERY Right     There were no vitals filed for this visit.   Subjective Assessment - 02/10/21 1235     Subjective  I aggravated my hip    Pertinent History Parkinson's Disease.  PMH:  hx of polymyositis, hx of orthostatic tachycardia, memory, Raynaud's disease, L CTR, R wrist fx surgery, L hip fracture 2/22 from fall, hx of L elbow fx due to fall 2/22 (surgery x2), sleep apnea, restless leg syndrome, hx of back pain, mild cognitive impairment.    Limitations fall risk    Patient Stated Goals move better    Currently in Pain? Yes    Pain Score 5     Pain Location Hip    Pain Orientation Left    Pain Descriptors / Indicators Sharp    Pain Type Chronic pain    Pain Onset More than a month ago    Pain Frequency Constant    Aggravating Factors  nothing    Pain  Relieving Factors pain meds, loosen up              Practiced writing.  Pt able to maintain size and legibility (min micrographia) with short phases/sentences.  Signature with good legibility.  Practiced continuous "l" with mod micrographia.            OT Education - 02/10/21 1245     Education Details Coordination HEP with focus on large amplitude movements--see pt instructions.  PD Handwriting Strategies--handout provided (issued 2 tan foam grips for home)    Person(s) Educated Patient    Methods Explanation;Demonstration;Verbal cues;Tactile cues;Handout    Comprehension Verbalized understanding;Returned demonstration;Verbal cues required;Tactile cues required;Need further instruction              OT Short Term Goals - 02/10/21 1250       OT SHORT TERM GOAL #1   Title Pt will be independent with PD-specific HEP-- check STGs 01/29/21    Time 4    Period Weeks    Status On-going      OT SHORT TERM GOAL #2   Title Pt will verbalize understanding of ways to decr risk of complications  related to PD.    Time 4    Period Weeks    Status On-going      OT SHORT TERM GOAL #3   Title Pt will improve ability to use utensils for eating and shown by improving time on PPT#2 by at least 4sec.    Baseline 24sec    Time 4    Period Weeks    Status New      OT SHORT TERM GOAL #4   Title Pt will improve functional reaching/coordination for ADLs/IADLs as shown by improving score on box and blocks test by at least 3 bilaterally.    Baseline R-29 blocks, L-27 blocks    Time 4    Period Weeks    Status New      OT SHORT TERM GOAL #5   Title Pt will verbalize understanding of memory compensation strategies and ways to keep thinking skills sharp.    Time 4    Period Weeks    Status On-going      OT SHORT TERM GOAL #6   Title Pt will be able to write at least 4 sentences with good legibility and only min decr in size.    Time 4    Period Weeks    Status New                OT Long Term Goals - 12/31/20 1514       OT LONG TERM GOAL #1   Title Pt will verbalize understanding of adaptive strategies/AE to incr ease/safety with ADLs/IADLs.--check LTGs 03/31/21    Time 12    Period Weeks    Status New      OT LONG TERM GOAL #2   Title Pt will demo at least 110* shoulder flex with at least -50* elbow ext for improved overhead reaching with each UE.    Baseline R shoulder flex 100* with -55* elbow ext, L shoulder flex 105* with -60* elbow ext    Time 12    Period Weeks    Status New      OT LONG TERM GOAL #3   Title Pt will verbalize understanding of appropriate community resources/continuing fitness opportunities related to PD.    Time 12    Period Weeks    Status New      OT LONG TERM GOAL #4   Title Pt will be able to fastening/unfastening buttons in 57sec or less.    Baseline 67sec    Time 12    Period Weeks    Status New      OT LONG TERM GOAL #5   Title Pt will be able to don/doff jacket mod I in 58min or less.    Baseline unable/needs assist    Time 12    Period Weeks    Status New      OT LONG TERM GOAL #6   Title Pt will improve bilateral hand coordination for ADLs as shown by improving time on 9-hole peg test by at least 5sec bilaterally.    Baseline R-42.10sec, L-48.19sec    Time 12    Period Weeks    Status New                   Plan - 02/10/21 1240     Clinical Impression Statement Pt continues to need vervbal and tactile cueing for significantly incr movement amplitude at times. Pt would benefit from further repetition and cues to incr carryover of large amplitude movement  strategies.  Progress slowed due to pt only being seen 1x/wk due to pt schedule conflicts/constraints.  Continue with unmet goals.    OT Occupational Profile and History Detailed Assessment- Review of Records and additional review of physical, cognitive, psychosocial history related to current functional performance    Occupational performance  deficits (Please refer to evaluation for details): ADL's;IADL's;Work;Leisure;Social Participation    Body Structure / Function / Physical Skills ADL;Decreased knowledge of use of DME;Balance;Dexterity;Tone;UE functional use;IADL;ROM;Mobility;Coordination;Flexibility;Decreased knowledge of precautions;FMC;Improper spinal/pelvic alignment   bradykinesia   Cognitive Skills Attention;Memory    Rehab Potential Good    Clinical Decision Making Several treatment options, min-mod task modification necessary    Comorbidities Affecting Occupational Performance: May have comorbidities impacting occupational performance    Modification or Assistance to Complete Evaluation  Min-Moderate modification of tasks or assist with assess necessary to complete eval    OT Frequency 2x / week    OT Duration 12 weeks   +eval, may d/c after 8wks depending on progress   OT Treatment/Interventions Self-care/ADL training;DME and/or AE instruction;Balance training;Therapeutic activities;Aquatic Therapy;Therapeutic exercise;Cognitive remediation/compensation;Neuromuscular education;Functional Mobility Training;Passive range of motion;Patient/family education;Manual Therapy;Energy conservation;Moist Heat    Plan check STGs, review supine PWR! moves, functional reaching with large amplitude movements, strategies for ADLs/IADLs    Consulted and Agree with Plan of Care Patient             Patient will benefit from skilled therapeutic intervention in order to improve the following deficits and impairments:   Body Structure / Function / Physical Skills: ADL, Decreased knowledge of use of DME, Balance, Dexterity, Tone, UE functional use, IADL, ROM, Mobility, Coordination, Flexibility, Decreased knowledge of precautions, FMC, Improper spinal/pelvic alignment (bradykinesia) Cognitive Skills: Attention, Memory     Visit Diagnosis: Other symptoms and signs involving the nervous system  Other symptoms and signs involving the  musculoskeletal system  Other lack of coordination  Abnormal posture  Stiffness of right shoulder, not elsewhere classified  Stiffness of left shoulder, not elsewhere classified  Stiffness of right elbow, not elsewhere classified  Stiffness of left elbow, not elsewhere classified  Tremor  Unsteadiness on feet    Problem List Patient Active Problem List   Diagnosis Date Noted   Gait abnormality 08/29/2019   Midline low back pain without sciatica 08/29/2019   Mild cognitive impairment 08/29/2019   Excessive daytime sleepiness 01/24/2019   Nocturia more than twice per night 01/24/2019   Hypersomnia with sleep apnea 01/24/2019   At risk for central sleep apnea 01/24/2019   RLS (restless legs syndrome) 01/24/2019   Idiopathic Parkinson's disease ([place]) 05/07/2015   Polymyositis ([place]) 05/07/2015    Bilaal Leib, OT/L 02/10/2021, 2:15 PM  [facility] [address] [place], [place], [address] Phone: [phone]   Fax:  [phone]  Name: Alexander Thomas MRN: [id number] Date of Birth: Aug 04, 1945  [name], OTR/L [facility] [address] [place],   [address] [phone] phone [phone] 02/10/21 2:15 PM

## 2021-02-10 NOTE — Patient Instructions (Addendum)
   Coordination Exercises  Perform the following exercises for 20 minutes 1 times per day. Perform with both hand(s). Perform using big movements.  Flipping Cards: Place deck of cards on the table. Flip cards over by opening your hand big to grasp and then turn your palm up big, opening hand fully to release. Deal cards: Hold 1/2 or whole deck in your hand. Use thumb to push card off top of deck with one big push. Rotate ball with fingertips: Pick up with fingers/thumb and move as much as you can with each turn/movement (clockwise and counter-clockwise). Pick up coins and stack one at a time: Open hand big and pick up with big, intentional movements. Do not drag coin to the edge. (5-10 in a stack) Pick up 5-10 coins one at a time and hold in palm. Then, move coins from palm to fingertips one at time and place in coin bank/container. Practice writing: Slow down, write big, and focus on forming each letter.  Stop and stretch your hand open big frequently and if you get small.   Practice typing. Stretch hand frequently.

## 2021-02-12 ENCOUNTER — Ambulatory Visit: Payer: Medicare Other | Admitting: Physical Therapy

## 2021-02-12 ENCOUNTER — Encounter: Payer: Medicare Other | Admitting: Occupational Therapy

## 2021-02-17 ENCOUNTER — Other Ambulatory Visit: Payer: Self-pay

## 2021-02-17 ENCOUNTER — Encounter: Payer: Self-pay | Admitting: Occupational Therapy

## 2021-02-17 ENCOUNTER — Ambulatory Visit: Payer: Medicare Other | Admitting: Physical Therapy

## 2021-02-17 ENCOUNTER — Ambulatory Visit: Payer: Medicare Other

## 2021-02-17 ENCOUNTER — Encounter: Payer: Self-pay | Admitting: Physical Therapy

## 2021-02-17 ENCOUNTER — Ambulatory Visit: Payer: Medicare Other | Admitting: Occupational Therapy

## 2021-02-17 DIAGNOSIS — R278 Other lack of coordination: Secondary | ICD-10-CM

## 2021-02-17 DIAGNOSIS — R251 Tremor, unspecified: Secondary | ICD-10-CM

## 2021-02-17 DIAGNOSIS — M25612 Stiffness of left shoulder, not elsewhere classified: Secondary | ICD-10-CM

## 2021-02-17 DIAGNOSIS — M25611 Stiffness of right shoulder, not elsewhere classified: Secondary | ICD-10-CM | POA: Diagnosis not present

## 2021-02-17 DIAGNOSIS — R293 Abnormal posture: Secondary | ICD-10-CM | POA: Diagnosis not present

## 2021-02-17 DIAGNOSIS — R2689 Other abnormalities of gait and mobility: Secondary | ICD-10-CM

## 2021-02-17 DIAGNOSIS — R471 Dysarthria and anarthria: Secondary | ICD-10-CM | POA: Diagnosis not present

## 2021-02-17 DIAGNOSIS — R29898 Other symptoms and signs involving the musculoskeletal system: Secondary | ICD-10-CM

## 2021-02-17 DIAGNOSIS — R29818 Other symptoms and signs involving the nervous system: Secondary | ICD-10-CM

## 2021-02-17 DIAGNOSIS — M25621 Stiffness of right elbow, not elsewhere classified: Secondary | ICD-10-CM

## 2021-02-17 DIAGNOSIS — R2681 Unsteadiness on feet: Secondary | ICD-10-CM

## 2021-02-17 DIAGNOSIS — M25622 Stiffness of left elbow, not elsewhere classified: Secondary | ICD-10-CM

## 2021-02-17 NOTE — Patient Instructions (Signed)
Sit to Stand Transfers:  Scoot out to the edge of the chair Place your feet flat on the floor, shoulder width apart.  Make sure your feet are tucked just under your knees. Lean forward (nose over toes) with momentum, and stand up tall with your best posture.  If you need to use your arms, use them as a quick boost up to stand. If you are in a low or soft chair, you can lean back and then forward up to stand, in order to get more momentum. Once you are standing, make sure you are looking ahead and standing tall.  To sit down:  Back up until you feel the chair behind your legs. Bend at you hips, reaching  Back for you chair, if needed, then slowly squat to sit down on your chair.  Access Code: [email or web address] URL: [email or web address] Date: 02/17/2021 Prepared by: Janann August  Exercises Lateral Weight Shift - 1 x daily - 5 x weekly - 2 sets - 10 reps

## 2021-02-17 NOTE — Patient Instructions (Signed)
Performing Daily Activities with Big Movements  Pick at least 2 activities a day and perform with BIG, DELIBERATE movements/effort.  Try different activities each day. This can make the activity easier and turn daily activities into exercise to prevent problems in the future!  If you are standing during the activity, make sure to keep feet apart and stand with good/big/PWR! UP posture.  Examples: Dressing  Push arms in sleeves, twist when putting on jacket, good posture and bring shirt to head (don't bring head down) push foot into pants deliberately open hands to pull down shirt/put on socks/pull up pants Buttoning - Open hands big (PWR! Hands) before fastening each button, deliberate movement (angry buttons) Bathing - Wash/dry with long strokes Brushing your teeth - Big, slow movements Cutting food - Long deliberate cuts, put tip of knife down in front of food Eating - Hold utensil in the middle, not the end, hold fork straight up and down to stab food Picking up a cup/bottle - Open hand up big and get object all the way in palm Opening jar/bottle - Move as much as you can with each turn, twist wrist Putting on seatbelt - Twist when reaching, look at where you are reaching Hanging up clothes/getting clothes down from closet - Reach with big effort, open hand, straighten elbow Putting away groceries/dishes - Reach with big effort Wiping counter/table - Move in big, long strokes, open hand Stirring while cooking - Exaggerate movement Cleaning windows - Move in big, long strokes Sweeping/Raking- Move arms in big, long strokes, feet apart and one in front of the other Vacuuming - Push with big movement, feet apart and one in front of the other Folding clothes - Exaggerate arm movements Washing car - Move in big, long strokes, feet apart Changing light bulb or Using a screwdriver - Move as much as you can with each turn, twist wrist Walking into a store/restaurant - Walk with big  steps, swing arms if able Standing up from a chair/recliner/sofa - Scoot forward, lean forward, and stand with big effort Picking up something from floor/reaching in low cabinet - Get close to object, Position feet apart and one in front of the other.       Ways to prevent future Parkinson's related complications:  1.   Exercise regularly.  Perform your therapy exercises and incorporate safe aerobic exercise when possible (swimming, stationary bike, arm bike, seated stepper)  2.   Keep you feet apart when you are standing to allow you to have better balance and reach further (which can help with shoulder rigidity).  Also make sure your feet are apart when you are sitting before you stand up.  3.   Focus on BIGGER movements during daily activities- really reach overhead, straighten elbows and extend fingers  4.   Anytime you reach or move shoulder, make sure you have good upright posture.  5.   When dressing or reaching for your seatbelt make sure to use your body to assist by twisting and looking at where you are reaching while you reach--this can help to minimize stress on the shoulder and reduce the risk of a rotator cuff tear  6.  When you reach for something overhead, make sure your thumb is facing up.  This is a better position for your shoulder.  7.  Swing your arms when you walk!  People with PD are at increased risk for frozen shoulder and swinging your arms can reduce this risk.       Marland Kitchen

## 2021-02-17 NOTE — Therapy (Signed)
[facility] [address] [place], [place], [address] Phone: [phone]   Fax:  [phone]  Speech Language Pathology Treatment  Patient Details  Name: Alexander Thomas MRN: [id number] Date of Birth: [birth year] Referring Provider (SLP): Marcial Pacas, MD   Encounter Date: 02/17/2021   End of Session - 02/17/21 1402     Visit Number 5    Number of Visits 25    Date for SLP Re-Evaluation 03/28/21    Authorization Type Medicare    SLP Start Time 1407    SLP Stop Time  1445    SLP Time Calculation (min) 38 min    Activity Tolerance Patient tolerated treatment well             Past Medical History:  Diagnosis Date   Hypercholesteremia    Migraine    Polymyositis ([place])    Raynaud disease    Tremor of right hand     Past Surgical History:  Procedure Laterality Date   CARPAL TUNNEL RELEASE Left    INGUINAL HERNIA REPAIR Bilateral    WRIST SURGERY Right     There were no vitals filed for this visit.   Subjective Assessment - 02/17/21 1410     Subjective "Voice seems stronger"    Currently in Pain? Yes    Pain Score 5     Pain Location Hip    Pain Orientation Left                   ADULT SLP TREATMENT - 02/17/21 1402       General Information   Behavior/Cognition Alert;Cooperative;Pleasant mood      Treatment Provided   Treatment provided Cognitive-Linquistic      Cognitive-Linquistic Treatment   Treatment focused on Dysarthria    Skilled Treatment HEP reportedly completed "several times a week." Wife cues him to complete loud /a/ while in car. 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 SLP emphasized HEP BID to maximize outcomes and carryover. Loud /a/ completed with average of 85 dB x5. Oral sentence reading was WNL. Pt performed abdominal breathing at rest with rare min A over 2 minutes. Hoarse voice present often on structured tasks and  connected speech. SLP provided occasional to usual cues for abdominal breathing, with rest and water breaks provided.      Assessment / Recommendations / Plan   Plan Continue with current plan of care      Progression Toward Goals   Progression toward goals Progressing toward goals              SLP Education - 02/17/21 1421     Education Details HEP BID    Person(s) Educated Patient    Methods Explanation;Demonstration;Verbal cues    Comprehension Verbalized understanding;Returned demonstration;Verbal cues required;Need further instruction              SLP Short Term Goals - 02/17/21 1403       SLP SHORT TERM GOAL #1   Title Pt will complete dysarthria HEP given occasional min A over 2 sessions    Time 4    Period Weeks    Status Partially Met    Target Date 02/17/21   extended as pt started tx 2 weeks after eval     SLP SHORT TERM GOAL #2   Title Pt will produce loud /a/ or "hey!" with at least low 90s dB average over three sessions    Baseline  02-03-21    Time 4    Period Weeks    Status Partially Met    Target Date 02/17/21      SLP SHORT TERM GOAL #3   Title Pt will demo abdominal breathing at rest and during sentence responses with 80% accuracy given occasional min A over 2 sessions    Baseline 02-17-21    Time 4    Period Weeks    Status Partially Met    Target Date 02/17/21      SLP SHORT TERM GOAL #4   Title Pt will maintain average of 70 DB in Q and A using compensations given occasional min A across 3 sessions    Time 4    Period Weeks    Status Not Met    Target Date 02/17/21      SLP SHORT TERM GOAL #5   Title Pt will demonstrate dysarthria compensations in 15 minute mod complex conversation with occasional min A over 2 sessions    Time 4    Period Weeks    Status Not Met    Target Date 02/17/21              SLP Long Term Goals - 02/17/21 1403       SLP LONG TERM GOAL #1   Title Pt will complete dysarthria HEP given rare min A  over 2 sessions    Time 8    Period Weeks    Status On-going    Target Date 02/28/21      SLP LONG TERM GOAL #2   Title Pt will maintain average of 70 DB in 30 conversation with rare min A across 3 sessions    Time 12    Period Weeks    Status On-going    Target Date 03/28/21      SLP LONG TERM GOAL #3   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 Pt was unaccompanied today. Ongoing education and training completed for implementation of dysarthria compensations and HEP. Loud /a/ averaged mid 80s dB. Reading at sentence level averaged low 70s dB. In Q and A format, decline in vocal intensity and hoarseness exhibited. Usual cues required to maximize breath support this week to reduce hoarseness and optimize loudness. 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    Speech Therapy Frequency 2x / week    Duration 12 weeks   12 weeks to account for scheduling and missed visits   Treatment/Interventions Compensatory strategies;Functional tasks;Patient/family education;Compensatory techniques;Internal/external aids;SLP instruction and feedback    Potential to Achieve Goals Good    SLP Home Exercise Plan provided    Consulted and Agree with Plan of Care Patient  Patient will benefit from skilled therapeutic intervention in order  to improve the following deficits and impairments:   Dysarthria and anarthria    Problem List Patient Active Problem List   Diagnosis Date Noted   Gait abnormality 08/29/2019   Midline low back pain without sciatica 08/29/2019   Mild cognitive impairment 08/29/2019   Excessive daytime sleepiness 01/24/2019   Nocturia more than twice per night 01/24/2019   Hypersomnia with sleep apnea 01/24/2019   At risk for central sleep apnea 01/24/2019   RLS (restless legs syndrome) 01/24/2019   Idiopathic Parkinson's disease ([place]) 05/07/2015   Polymyositis ([place]) 05/07/2015    Alinda Deem, MA CCC-SLP 02/17/2021, 2:53 PM  [facility] [address] [place], [place], [address] Phone: [phone]   Fax:  [phone]   Name: Alexander Thomas MRN: [id number] Date of Birth: 1945/06/03

## 2021-02-17 NOTE — Patient Instructions (Signed)
1) Did you take the dog out? 2) Did you sleep well? 3) Do we have appointments today? 4) Are you going to breakfast? 5) Have you heard from the children? 6) Do we have plans for Friday? 7) Is everyone OK in [place]? 8) I have an appointment tomorrow. 9) Will we eat lunch in [place]? 10) Do we need to shop in [place]?

## 2021-02-17 NOTE — Therapy (Signed)
[facility] [address] [place], [place], [address] Phone: [phone]   Fax:  [phone]  Occupational Therapy Treatment  Patient Details  Name: Alexander Thomas MRN: [id number] Date of Birth: 05-Aug-1945 Referring Provider (OT): Dr. Marcial Pacas   Encounter Date: 02/17/2021   OT End of Session - 02/17/21 1327     Visit Number 5    Number of Visits 25    Date for OT Re-Evaluation 03/31/21    Authorization Type Medicare & BCBS--Covered 100%.  cert. period 12/31/20-03/31/21    Authorization - Visit Number 5    Authorization - Number of Visits 10    Progress Note Due on Visit 10    OT Start Time 1325    OT Stop Time 1403    OT Time Calculation (min) 38 min    Activity Tolerance Patient tolerated treatment well    Behavior During Therapy WFL for tasks assessed/performed             Past Medical History:  Diagnosis Date   Hypercholesteremia    Migraine    Polymyositis (HCC)    Raynaud disease    Tremor of right hand     Past Surgical History:  Procedure Laterality Date   CARPAL TUNNEL RELEASE Left    INGUINAL HERNIA REPAIR Bilateral    WRIST SURGERY Right     There were no vitals filed for this visit.   Subjective Assessment - 02/17/21 1327     Subjective  I feel like I'm moving faster    Pertinent History Parkinson's Disease.  PMH:  hx of polymyositis, hx of orthostatic tachycardia, memory, Raynaud's disease, L CTR, R wrist fx surgery, L hip fracture 2/22 from fall, hx of L elbow fx due to fall 2/22 (surgery x2), sleep apnea, restless leg syndrome, hx of back pain, mild cognitive impairment.    Limitations fall risk    Patient Stated Goals move better    Currently in Pain? Yes    Pain Score 5     Pain Location Hip    Pain Orientation Left    Pain Descriptors / Indicators Sharp    Pain Type Chronic pain    Pain Onset More than a month ago    Pain Frequency Constant    Aggravating Factors  nothing     Pain Relieving Factors pain meds, loosening it up               Reviewed PWR! Moves in supine (basic 4) with min-mod cueing for large amplitude movements:  PWR! Up with tactile/verbal cues, Rock to target for visual cue and verbal cue, Twist with verbal/tactile cues, and modified step (bridging followed by marching due to hip pain/difficulty) with verbal cues  Educated pt in supine>sit transfer using large amplitude movement strategies and pt returned demo with min cueing and incr ease.           OT Education - 02/17/21 1524     Education Details Began education reguarding large amplitude movement strategies for ADLs/IADLs; Educated re:  Ways to decr risk of future complications    Person(s) Educated Patient    Methods Explanation;Demonstration;Verbal cues;Handout    Comprehension Verbalized understanding;Returned demonstration;Need further instruction;Verbal cues required              OT Short Term Goals - 02/17/21 1522       OT SHORT TERM GOAL #1   Title Pt will be independent with PD-specific HEP-- check STGs 01/29/21  Time 4    Period Weeks    Status On-going   02/17/21:  needs cueing, review     OT SHORT TERM GOAL #2   Title Pt will verbalize understanding of ways to decr risk of complications related to PD.    Time 4    Period Weeks    Status On-going   02/17/21:  educated, but would benefit from review     OT [place] #3   Title Pt will improve ability to use utensils for eating and shown by improving time on PPT#2 by at least 4sec.    Baseline 24sec    Time 4    Period Weeks    Status On-going      OT SHORT TERM GOAL #4   Title Pt will improve functional reaching/coordination for ADLs/IADLs as shown by improving score on box and blocks test by at least 3 bilaterally.    Baseline R-29 blocks, L-27 blocks    Time 4    Period Weeks    Status On-going      OT SHORT TERM GOAL #5   Title Pt will verbalize understanding of memory  compensation strategies and ways to keep thinking skills sharp.    Time 4    Period Weeks    Status On-going   02/17/21:  not yet addressed     OT SHORT TERM GOAL #6   Title Pt will be able to write at least 4 sentences with good legibility and only min decr in size.    Time 4    Period Weeks    Status On-going               OT Long Term Goals - 12/31/20 1514       OT LONG TERM GOAL #1   Title Pt will verbalize understanding of adaptive strategies/AE to incr ease/safety with ADLs/IADLs.--check LTGs 03/31/21    Time 12    Period Weeks    Status New      OT LONG TERM GOAL #2   Title Pt will demo at least 110* shoulder flex with at least -50* elbow ext for improved overhead reaching with each UE.    Baseline R shoulder flex 100* with -55* elbow ext, L shoulder flex 105* with -60* elbow ext    Time 12    Period Weeks    Status New      OT LONG TERM GOAL #3   Title Pt will verbalize understanding of appropriate community resources/continuing fitness opportunities related to PD.    Time 12    Period Weeks    Status New      OT LONG TERM GOAL #4   Title Pt will be able to fastening/unfastening buttons in 57sec or less.    Baseline 67sec    Time 12    Period Weeks    Status New      OT LONG TERM GOAL #5   Title Pt will be able to don/doff jacket mod I in 56min or less.    Baseline unable/needs assist    Time 12    Period Weeks    Status New      OT LONG TERM GOAL #6   Title Pt will improve bilateral hand coordination for ADLs as shown by improving time on 9-hole peg test by at least 5sec bilaterally.    Baseline R-42.10sec, L-48.19sec    Time 12    Period Weeks    Status New  Plan - 02/17/21 1520     Clinical Impression Statement Began education regarding ways to decr risk of future complications and ADL strategies.  Pt will need continued education and reinforcement for carryover.  Pt demo improved supine>sit transfer with use of  large amplitude movement strategies.  Progress slowed due to pt only being seen 1x/wk due to pt schedule conflicts/constraints.  Continue with unmet goals.    OT Occupational Profile and History Detailed Assessment- Review of Records and additional review of physical, cognitive, psychosocial history related to current functional performance    Occupational performance deficits (Please refer to evaluation for details): ADL's;IADL's;Work;Leisure;Social Participation    Body Structure / Function / Physical Skills ADL;Decreased knowledge of use of DME;Balance;Dexterity;Tone;UE functional use;IADL;ROM;Mobility;Coordination;Flexibility;Decreased knowledge of precautions;FMC;Improper spinal/pelvic alignment   bradykinesia   Cognitive Skills Attention;Memory    Rehab Potential Good    Clinical Decision Making Several treatment options, min-mod task modification necessary    Comorbidities Affecting Occupational Performance: May have comorbidities impacting occupational performance    Modification or Assistance to Complete Evaluation  Min-Moderate modification of tasks or assist with assess necessary to complete eval    OT Frequency 2x / week    OT Duration 12 weeks   +eval, may d/c after 8wks depending on progress   OT Treatment/Interventions Self-care/ADL training;DME and/or AE instruction;Balance training;Therapeutic activities;Aquatic Therapy;Therapeutic exercise;Cognitive remediation/compensation;Neuromuscular education;Functional Mobility Training;Passive range of motion;Patient/family education;Manual Therapy;Energy conservation;Moist Heat    Plan check PPT#2, eating strategies, box and blocks test, functional reaching with large amplitude movements, continue with strategies for ADLs/IADLs    Consulted and Agree with Plan of Care Patient             Patient will benefit from skilled therapeutic intervention in order to improve the following deficits and impairments:   Body Structure / Function /  Physical Skills: ADL, Decreased knowledge of use of DME, Balance, Dexterity, Tone, UE functional use, IADL, ROM, Mobility, Coordination, Flexibility, Decreased knowledge of precautions, FMC, Improper spinal/pelvic alignment (bradykinesia) Cognitive Skills: Attention, Memory     Visit Diagnosis: Other symptoms and signs involving the musculoskeletal system  Other lack of coordination  Abnormal posture  Stiffness of right shoulder, not elsewhere classified  Stiffness of left shoulder, not elsewhere classified  Stiffness of right elbow, not elsewhere classified  Stiffness of left elbow, not elsewhere classified  Tremor  Unsteadiness on feet  Other symptoms and signs involving the nervous system  Other abnormalities of gait and mobility    Problem List Patient Active Problem List   Diagnosis Date Noted   Gait abnormality 08/29/2019   Midline low back pain without sciatica 08/29/2019   Mild cognitive impairment 08/29/2019   Excessive daytime sleepiness 01/24/2019   Nocturia more than twice per night 01/24/2019   Hypersomnia with sleep apnea 01/24/2019   At risk for central sleep apnea 01/24/2019   RLS (restless legs syndrome) 01/24/2019   Idiopathic Parkinson's disease ([place]) 05/07/2015   Polymyositis ([place]) 05/07/2015    Bowie Doiron, OT/L 02/17/2021, 3:25 PM  [facility] [address] [place], [place], [address] Phone: [phone]   Fax:  [phone]  Name: Alexander Thomas MRN: [id number] Date of Birth: 07/08/1945  [name], OTR/L [facility] [address] [place], [place]  [address] [phone] phone [phone] 02/17/21 3:25 PM

## 2021-02-17 NOTE — Therapy (Addendum)
[facility] [address] [place], [place], [address] Phone: [phone]   Fax:  [phone]  Physical Therapy Treatment  Patient Details  Name: Alexander Thomas MRN: [id number] Date of Birth: 1945/06/23 Referring Provider (PT): Dr. Krista Blue   Encounter Date: 02/17/2021   PT End of Session - 02/17/21 1308     Visit Number 5    Number of Visits 17    Date for PT Re-Evaluation 03/31/21    Authorization Type Medicare- part A & B    PT Start Time 1232    PT Stop Time 1314    PT Time Calculation (min) 42 min    Activity Tolerance Patient tolerated treatment well    Behavior During Therapy [facility] for tasks assessed/performed             Past Medical History:  Diagnosis Date   Hypercholesteremia    Migraine    Polymyositis ([place])    Raynaud disease    Tremor of right hand     Past Surgical History:  Procedure Laterality Date   CARPAL TUNNEL RELEASE Left    INGUINAL HERNIA REPAIR Bilateral    WRIST SURGERY Right     There were no vitals filed for this visit.   Subjective Assessment - 02/17/21 1234     Subjective No falls. Feels like he is getting better. Does the exercises when he remembers to. Has been doing a little bit of walking at home with his RW.    Pertinent History PD, inflammatory polymyositis, Raynaud's, CTR LUE, R wrist surgery, back pain;  L hip pain    Limitations Walking;House hold activities    Patient Stated Goals wants to know how much he might have slipped from last bout of therapy, wants to work on his problem areas    Currently in Pain? Yes    Pain Score 5     Pain Location Hip    Pain Orientation Left    Pain Descriptors / Indicators Sharp    Pain Type Chronic pain    Pain Onset More than a month ago    Aggravating Factors  nothing    Pain Relieving Factors pain meds, loosening it up                               [facility] Adult PT Treatment/Exercise - 02/17/21 1237        Transfers   Transfers Sit to Stand;Stand to Sit    Sit to Stand 5: Supervision    Sit to Stand Details Verbal cues for technique;Tactile cues for weight beaing    Stand to Sit 5: Supervision    Stand to Sit Details (indicate cue type and reason) Verbal cues for technique    Comments x10 reps with cues for scooting towards edge and incr forward lean to help come to stand, cued for posture once in standing. Provided handout on proper technique when performing at home.      Ambulation/Gait   Ambulation/Gait Yes    Ambulation/Gait Assistance 5: Supervision    Ambulation/Gait Assistance Details Cues for posture and step length. Then performed gait with RW down 30' hallway with turning with cues for incr stride length during turns to mimic hallway at home - x4 reps.    Ambulation Distance (Feet) 230 Feet    Assistive device Rolling walker    Gait Pattern Step-through pattern;Decreased step length - right;Decreased step length - left;Decreased  trunk rotation;Trunk flexed    Ambulation Surface Level;Indoor    Gait Comments Reviewed walking program for home with RW as pt had not been performing consistently and importance of walking program for exercise.                 Balance Exercises - 02/17/21 1309       Balance Exercises: Standing   Stepping Strategy Anterior;Lateral;UE support;Limitations    Stepping Strategy Limitations At countertop over 2" foam beam for incr foot clearance when stepping laterally x10 reps each leg. In the forward direction 2 colorful floor discs as visual cues for incr step length x10 reps each leg    Other Standing Exercises Wide BOS lateral weight shifting through hips 2 x 10 reps, then adding in reaching towards sticky notes at cabinets x10 reps                PT Education - 02/17/21 1308     Education Details Sit to stands and lateral weight shifting additions to HEP. Reviewed walking program    Person(s) Educated Patient    Methods  Explanation;Demonstration;Handout    Comprehension Verbalized understanding;Returned demonstration              PT Short Term Goals - 02/03/21 1248       PT SHORT TERM GOAL #1   Title Pt will perform initial HEP with wife's supervision in order to build upon functional gains made in therapy. ALL STGS DUE 01/28/21    Baseline 02/03/21 Pt able to perform initial HEP with supervision for some cues.    Time 4    Period Weeks    Status Achieved    Target Date 01/28/21      PT SHORT TERM GOAL #2   Title Pt will verbalize understanding of fall prevention in the home environment.    Baseline 02/03/21 Pt has been instructed in fall prevention strategies.    Time 4    Period Weeks    Status Achieved      PT SHORT TERM GOAL #3   Title Pt will improve 5x sit <> stand time without UE support to 20 seconds or less in order to demo improved balance/functional strength for transfers.    Baseline 24.56 seconds. 02/03/21 21.83 sec    Time 4    Period Weeks    Status Partially Met      PT SHORT TERM GOAL #4   Title Pt will improve gait speed with SPC vs. LRAD to at least 2.4 ft/sec in order to demo improved community mobility.    Baseline 2.11 ft/sec with SPC (pt holding cane at times). cane= 0.74m/s or 1.59ft/sec,  with RW=0.59m/s or 1.42ft /sec    Time 4    Period Weeks    Status Not Met               PT Long Term Goals - 12/31/20 1243       PT LONG TERM GOAL #1   Title Pt will perform progression of  HEP with wife's cues/supervision, for improved strength, balance, transfers, and gait. ALL LTGS DUE 02/25/21    Time 8    Period Weeks    Status New    Target Date 02/25/21      PT LONG TERM GOAL #2   Title Pt will improve 5x sit<>stand to less than or equal to 16.5 seconds for improved transfer efficiency and safety.    Baseline 24.56 seconds    Time 8  Period Weeks    Status New      PT LONG TERM GOAL #3   Title Pt will decr cog TUG score to 25 seconds or less in  order to demo decr fall risk/improved dual tasking.    Baseline 36.81 seconds with SPC    Time 8    Period Weeks    Status New      PT LONG TERM GOAL #4   Title Pt will improve gait velocity to at least 2.7 ft/sec for improved gait efficiency and safety.    Baseline 2.11 ft/sec    Time 8    Period Weeks    Status New      PT LONG TERM GOAL #5   Title Pt will verbalize understanding of local Parkinson's disease resources.    Time 8    Period Weeks    Status New      Additional Long Term Goals   Additional Long Term Goals Yes      PT LONG TERM GOAL #6   Title Pt will recover posterior and anterior balance in push and release test in 2 or less steps independently, for improved balance recovery    Baseline anterior and posterior = 4 small, shuffled steps, needing min A in anterior direction    Time 8    Period Weeks    Status New                   Plan - 02/17/21 1702     Clinical Impression Statement Reviewed pt's walking program for home with RW as pt had not been performing consistently. Performed additional walking in hallway with cues for incr stride length when performing turns. Reviewed sit <> stand technique with pt needing cues for incr forward weight shift, added to pt's HEP. Will continue to progress towards LTGs.    Personal Factors and Comorbidities Comorbidity 3+;Time since onset of injury/illness/exacerbation;Past/Current Experience    Comorbidities PD, inflammatory polymyositis, Raynaud's, CTR LUE, R wrist surgery, back pain; L hip fracture 2/22 from fall, hx of L elbow fx due to fall 2/22 (surgery x2)    Examination-Activity Limitations Stand;Stairs;Transfers;Squat;Locomotion Level    Examination-Participation Restrictions Medication Management;Community Activity;Yard Work    [employer] Evolving/Moderate complexity    Rehab Potential Good    PT Frequency 2x / week    PT Duration 12 weeks    PT Treatment/Interventions ADLs/Self  Care Home Management;DME Instruction;Gait training;Therapeutic activities;Stair training;Therapeutic exercise;Balance training;Contrast Bath;Neuromuscular re-education;Patient/family education;Functional mobility training;Vestibular;Energy conservation    PT Next Visit Plan review lateral weight shifting to HEP. sit <> stand training. working on gait training with RW with posture/incr stride length. standing balance. Continue work on turning with large marching steps and wide U turns with incr stride length.  discuss seeing if his wife can come in to some future appts for incr carryover at home.    PT Home Exercise Plan seated PWR (no PWR! Twist due to hx of hip/pelvis fracture), [id number],sit to stands.    Consulted and Agree with Plan of Care Patient             Patient will benefit from skilled therapeutic intervention in order to improve the following deficits and impairments:  Abnormal gait, Decreased activity tolerance, Decreased balance, Decreased coordination, Decreased endurance, Decreased mobility, Decreased strength, Difficulty walking, Impaired flexibility, Postural dysfunction, Pain  Visit Diagnosis: Abnormal posture  Other lack of coordination  Other abnormalities of gait and mobility     Problem List Patient  Active Problem List   Diagnosis Date Noted   Gait abnormality 08/29/2019   Midline low back pain without sciatica 08/29/2019   Mild cognitive impairment 08/29/2019   Excessive daytime sleepiness 01/24/2019   Nocturia more than twice per night 01/24/2019   Hypersomnia with sleep apnea 01/24/2019   At risk for central sleep apnea 01/24/2019   RLS (restless legs syndrome) 01/24/2019   Idiopathic Parkinson's disease ([place]) 05/07/2015   Polymyositis ([place]) 05/07/2015    Arliss Journey, PT, DPT  02/17/2021, 5:04 PM  [facility] [address] [place], [place], [address] Phone: [phone]   Fax:   [phone]  Name: HARWOOD NALL MRN: [id number] Date of Birth: 12-Jun-1945

## 2021-02-19 ENCOUNTER — Ambulatory Visit: Payer: Medicare Other | Admitting: Physical Therapy

## 2021-02-19 ENCOUNTER — Encounter: Payer: Medicare Other | Admitting: Occupational Therapy

## 2021-02-24 ENCOUNTER — Ambulatory Visit: Payer: Medicare Other | Attending: Neurology | Admitting: Occupational Therapy

## 2021-02-24 ENCOUNTER — Ambulatory Visit: Payer: Medicare Other

## 2021-02-24 ENCOUNTER — Encounter: Payer: Self-pay | Admitting: Physical Therapy

## 2021-02-24 ENCOUNTER — Encounter: Payer: Self-pay | Admitting: Occupational Therapy

## 2021-02-24 ENCOUNTER — Other Ambulatory Visit: Payer: Self-pay

## 2021-02-24 ENCOUNTER — Ambulatory Visit: Payer: Medicare Other | Admitting: Physical Therapy

## 2021-02-24 DIAGNOSIS — R471 Dysarthria and anarthria: Secondary | ICD-10-CM

## 2021-02-24 DIAGNOSIS — M25622 Stiffness of left elbow, not elsewhere classified: Secondary | ICD-10-CM | POA: Diagnosis not present

## 2021-02-24 DIAGNOSIS — R293 Abnormal posture: Secondary | ICD-10-CM

## 2021-02-24 DIAGNOSIS — R251 Tremor, unspecified: Secondary | ICD-10-CM | POA: Insufficient documentation

## 2021-02-24 DIAGNOSIS — M25612 Stiffness of left shoulder, not elsewhere classified: Secondary | ICD-10-CM | POA: Insufficient documentation

## 2021-02-24 DIAGNOSIS — R2681 Unsteadiness on feet: Secondary | ICD-10-CM | POA: Insufficient documentation

## 2021-02-24 DIAGNOSIS — R2689 Other abnormalities of gait and mobility: Secondary | ICD-10-CM

## 2021-02-24 DIAGNOSIS — R29818 Other symptoms and signs involving the nervous system: Secondary | ICD-10-CM | POA: Insufficient documentation

## 2021-02-24 DIAGNOSIS — M25611 Stiffness of right shoulder, not elsewhere classified: Secondary | ICD-10-CM | POA: Insufficient documentation

## 2021-02-24 DIAGNOSIS — R278 Other lack of coordination: Secondary | ICD-10-CM

## 2021-02-24 DIAGNOSIS — R29898 Other symptoms and signs involving the musculoskeletal system: Secondary | ICD-10-CM | POA: Diagnosis not present

## 2021-02-24 DIAGNOSIS — M25621 Stiffness of right elbow, not elsewhere classified: Secondary | ICD-10-CM | POA: Diagnosis not present

## 2021-02-24 NOTE — Therapy (Signed)
[facility] [address] [place], [place], [address] Phone: [phone]   Fax:  [phone]  Speech Language Pathology Treatment  Patient Details  Name: Alexander Thomas MRN: [id number] Date of Birth: Sep 09, 1945 Referring Provider (SLP): Marcial Pacas, MD   Encounter Date: 02/24/2021   End of Session - 02/24/21 1202     Visit Number 6    Number of Visits 25    Date for SLP Re-Evaluation 03/28/21    Authorization Type Medicare    SLP Start Time 1155    SLP Stop Time  1230    SLP Time Calculation (min) 35 min    Activity Tolerance Patient tolerated treatment well             Past Medical History:  Diagnosis Date   Hypercholesteremia    Migraine    Polymyositis (HCC)    Raynaud disease    Tremor of right hand     Past Surgical History:  Procedure Laterality Date   CARPAL TUNNEL RELEASE Left    INGUINAL HERNIA REPAIR Bilateral    WRIST SURGERY Right     There were no vitals filed for this visit.   Subjective Assessment - 02/24/21 1329     Subjective "I think it's better"    Currently in Pain? Yes    Pain Score 4     Pain Location Hip                   ADULT SLP TREATMENT - 02/24/21 1205       General Information   Behavior/Cognition Alert;Cooperative;Pleasant mood      Treatment Provided   Treatment provided Cognitive-Linquistic      Cognitive-Linquistic Treatment   Treatment focused on Dysarthria    Skilled Treatment Pt reports some inconsistent completion of dysarthria HEP. Loud /a/ averaged low 90s dB x5 trials with occasional cues required to optimize breath support. For oral reading passages, pt maintained WNL volume but intermittent hoarseness exhibited. SLP cued abdominal breath at puncutation to reduce hoarseness, which was intermittently effective. Usual throat clearing exhibited early in session, in which SLP provided education and handout re: throat clear alternatives to  reduce laryngeal irritation. SLP introduced forward focused phonation to optimize vocal quality.      Assessment / Recommendations / Plan   Plan Continue with current plan of care      Progression Toward Goals   Progression toward goals Progressing toward goals              SLP Education - 02/24/21 1200     Education Details throat clear alternatives    Person(s) Educated Patient    Methods Explanation;Demonstration;Handout    Comprehension Verbalized understanding;Returned demonstration;Need further instruction              SLP Short Term Goals - 02/17/21 1403       SLP SHORT TERM GOAL #1   Title Pt will complete dysarthria HEP given occasional min A over 2 sessions    Time 4    Period Weeks    Status Partially Met    Target Date 02/17/21   extended as pt started tx 2 weeks after eval     SLP SHORT TERM GOAL #2   Title Pt will produce loud /a/ or "hey!" with at least low 90s dB average over three sessions    Baseline 02-03-21    Time 4    Period Weeks    Status Partially Met  Target Date 02/17/21      SLP SHORT TERM GOAL #3   Title Pt will demo abdominal breathing at rest and during sentence responses with 80% accuracy given occasional min A over 2 sessions    Baseline 02-17-21    Time 4    Period Weeks    Status Partially Met    Target Date 02/17/21      SLP SHORT TERM GOAL #4   Title Pt will maintain average of 70 DB in Q and A using compensations given occasional min A across 3 sessions    Time 4    Period Weeks    Status Not Met    Target Date 02/17/21      SLP SHORT TERM GOAL #5   Title Pt will demonstrate dysarthria compensations in 15 minute mod complex conversation with occasional min A over 2 sessions    Time 4    Period Weeks    Status Not Met    Target Date 02/17/21              SLP Long Term Goals - 02/24/21 1335       SLP LONG TERM GOAL #1   Title Pt will complete dysarthria HEP given rare min A over 2 sessions    Time 8     Period Weeks    Status On-going    Target Date 02/28/21      SLP LONG TERM GOAL #2   Title Pt will maintain average of 70 DB in 30 conversation with rare min A across 3 sessions    Time 12    Period Weeks    Status On-going    Target Date 03/28/21      SLP LONG TERM GOAL #3   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 Pt was unaccompanied today. Ongoing education and training completed for implementation of dysarthria compensations and HEP. Loud /a/ averaged low 90s dB. Reading at sentence level averaged low 70s dB. Increased hoarseness exhibited during structured tasks and spontaneous speech today. Usual cues required to maximize breath support to reduce hoarseness and optimize loudness. 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    Speech Therapy Frequency 2x / week    Duration 12 weeks   12 weeks to account for scheduling and missed visits   Treatment/Interventions Compensatory strategies;Functional tasks;Patient/family education;Compensatory techniques;Internal/external aids;SLP instruction and feedback    Potential to Achieve Goals Good    SLP Home Exercise Plan provided    Consulted and Agree with Plan of Care Patient             Patient will benefit from skilled therapeutic intervention in order to improve the following  deficits  and impairments:   Dysarthria and anarthria    Problem List Patient Active Problem List   Diagnosis Date Noted   Gait abnormality 08/29/2019   Midline low back pain without sciatica 08/29/2019   Mild cognitive impairment 08/29/2019   Excessive daytime sleepiness 01/24/2019   Nocturia more than twice per night 01/24/2019   Hypersomnia with sleep apnea 01/24/2019   At risk for central sleep apnea 01/24/2019   RLS (restless legs syndrome) 01/24/2019   Idiopathic Parkinson's disease ([place]) 05/07/2015   Polymyositis ([facility]) 05/07/2015    Alinda Deem, MA CCC-SLP 02/24/2021, 1:35 PM  [facility] [address] [place], [place], [address] Phone: [phone]   Fax:  [phone]   Name: Alexander Thomas MRN: [id number] Date of Birth: 01/19/1946

## 2021-02-24 NOTE — Patient Instructions (Addendum)
Instead of clearing your throat (which irritates your voice box),  -take a sip of water  -swallow your saliva as hard as you can    Remember to breathe at the punctuation when you are reading

## 2021-02-24 NOTE — Therapy (Signed)
[facility] [address] [place], [place], [address] Phone: [phone]   Fax:  [phone]  Occupational Therapy Treatment  Patient Details  Name: Alexander Thomas MRN: [id number] Date of Birth: [birth year] Referring Provider (OT): Dr. Marcial Pacas   Encounter Date: 02/24/2021   OT End of Session - 02/24/21 1221     Visit Number 6    Number of Visits 25    Date for OT Re-Evaluation 03/31/21    Authorization Type Medicare & BCBS--Covered 100%.  cert. period 12/31/20-03/31/21    Authorization - Visit Number 6    Authorization - Number of Visits 10    Progress Note Due on Visit 10    OT Start Time 1232    OT Stop Time 1315    OT Time Calculation (min) 43 min    Activity Tolerance Patient tolerated treatment well    Behavior During Therapy WFL for tasks assessed/performed             Past Medical History:  Diagnosis Date   Hypercholesteremia    Migraine    Polymyositis (HCC)    Raynaud disease    Tremor of right hand     Past Surgical History:  Procedure Laterality Date   CARPAL TUNNEL RELEASE Left    INGUINAL HERNIA REPAIR Bilateral    WRIST SURGERY Right     There were no vitals filed for this visit.   Subjective Assessment - 02/24/21 1221     Subjective  Pt reports that he tried stretching and reaching and opening hands for dressing    Pertinent History Parkinson's Disease.  PMH:  hx of polymyositis, hx of orthostatic tachycardia, memory, Raynaud's disease, L CTR, R wrist fx surgery, L hip fracture 2/22 from fall, hx of L elbow fx due to fall 2/22 (surgery x2), sleep apnea, restless leg syndrome, hx of back pain, mild cognitive impairment.    Limitations fall risk    Patient Stated Goals move better    Currently in Pain? Yes    Pain Score 4     Pain Location Hip    Pain Orientation Left    Pain Descriptors / Indicators Sharp    Pain Type Chronic pain    Pain Onset More than a month ago    Pain  Frequency Constant    Aggravating Factors  nothing    Pain Relieving Factors pain meds, loosening it up              Practiced using large amplitude movement strategies for donning/doffing jacket with min-mod cueing and repetition, cutting food with min-mod cueing, opening bottles/jar with min cueing, and simulated dressing with bag (donning/doffing pants, donning/doffing t-shirt, clothing adjustment/donning socks) with min-mod cueing.          OT Education - 02/24/21 1417     Education Details Continued education reguarding large amplitude movement strategies for ADLs/IADLs and  Ways to decr risk of future complications    Person(s) Educated Patient;Spouse    Methods Explanation;Demonstration;Verbal cues;Handout;Tactile cues    Comprehension Verbalized understanding;Returned demonstration;Need further instruction;Verbal cues required              OT Short Term Goals - 02/17/21 1522       OT SHORT TERM GOAL #1   Title Pt will be independent with PD-specific HEP-- check STGs 01/29/21    Time 4    Period Weeks    Status On-going   02/17/21:  needs cueing, review  OT SHORT TERM GOAL #2   Title Pt will verbalize understanding of ways to decr risk of complications related to PD.    Time 4    Period Weeks    Status On-going   02/17/21:  educated, but would benefit from review     OT [place] #3   Title Pt will improve ability to use utensils for eating and shown by improving time on PPT#2 by at least 4sec.    Baseline 24sec    Time 4    Period Weeks    Status On-going      OT SHORT TERM GOAL #4   Title Pt will improve functional reaching/coordination for ADLs/IADLs as shown by improving score on box and blocks test by at least 3 bilaterally.    Baseline R-29 blocks, L-27 blocks    Time 4    Period Weeks    Status On-going      OT SHORT TERM GOAL #5   Title Pt will verbalize understanding of memory compensation strategies and ways to keep thinking  skills sharp.    Time 4    Period Weeks    Status On-going   02/17/21:  not yet addressed     OT SHORT TERM GOAL #6   Title Pt will be able to write at least 4 sentences with good legibility and only min decr in size.    Time 4    Period Weeks    Status On-going               OT Long Term Goals - 12/31/20 1514       OT LONG TERM GOAL #1   Title Pt will verbalize understanding of adaptive strategies/AE to incr ease/safety with ADLs/IADLs.--check LTGs 03/31/21    Time 12    Period Weeks    Status New      OT LONG TERM GOAL #2   Title Pt will demo at least 110* shoulder flex with at least -50* elbow ext for improved overhead reaching with each UE.    Baseline R shoulder flex 100* with -55* elbow ext, L shoulder flex 105* with -60* elbow ext    Time 12    Period Weeks    Status New      OT LONG TERM GOAL #3   Title Pt will verbalize understanding of appropriate community resources/continuing fitness opportunities related to PD.    Time 12    Period Weeks    Status New      OT LONG TERM GOAL #4   Title Pt will be able to fastening/unfastening buttons in 57sec or less.    Baseline 67sec    Time 12    Period Weeks    Status New      OT LONG TERM GOAL #5   Title Pt will be able to don/doff jacket mod I in 11min or less.    Baseline unable/needs assist    Time 12    Period Weeks    Status New      OT LONG TERM GOAL #6   Title Pt will improve bilateral hand coordination for ADLs as shown by improving time on 9-hole peg test by at least 5sec bilaterally.    Baseline R-42.10sec, L-48.19sec    Time 12    Period Weeks    Status New                   Plan - 02/24/21 1221  Clinical Impression Statement Continued education regarding ways to decr risk of future complications and ADL strategies.  Pt will need continued education and reinforcement for carryover.  Wife present for some of session and is able to cue pt.  Progress slowed due to pt only being seen  1x/wk due to pt schedule conflicts/constraints.    OT Occupational Profile and History Detailed Assessment- Review of Records and additional review of physical, cognitive, psychosocial history related to current functional performance    Occupational performance deficits (Please refer to evaluation for details): ADL's;IADL's;Work;Leisure;Social Participation    Body Structure / Function / Physical Skills ADL;Decreased knowledge of use of DME;Balance;Dexterity;Tone;UE functional use;IADL;ROM;Mobility;Coordination;Flexibility;Decreased knowledge of precautions;FMC;Improper spinal/pelvic alignment   bradykinesia   Cognitive Skills Attention;Memory    Rehab Potential Good    Clinical Decision Making Several treatment options, min-mod task modification necessary    Comorbidities Affecting Occupational Performance: May have comorbidities impacting occupational performance    Modification or Assistance to Complete Evaluation  Min-Moderate modification of tasks or assist with assess necessary to complete eval    OT Frequency 2x / week    OT Duration 12 weeks   +eval, may d/c after 8wks depending on progress   OT Treatment/Interventions Self-care/ADL training;DME and/or AE instruction;Balance training;Therapeutic activities;Aquatic Therapy;Therapeutic exercise;Cognitive remediation/compensation;Neuromuscular education;Functional Mobility Training;Passive range of motion;Patient/family education;Manual Therapy;Energy conservation;Moist Heat    Plan check PPT#2, eating strategies, box and blocks test, functional reaching with large amplitude movements, continue with strategies for ADLs/IADLs    Consulted and Agree with Plan of Care Patient             Patient will benefit from skilled therapeutic intervention in order to improve the following deficits and impairments:   Body Structure / Function / Physical Skills: ADL, Decreased knowledge of use of DME, Balance, Dexterity, Tone, UE functional use, IADL,  ROM, Mobility, Coordination, Flexibility, Decreased knowledge of precautions, FMC, Improper spinal/pelvic alignment (bradykinesia) Cognitive Skills: Attention, Memory     Visit Diagnosis: Other lack of coordination  Other symptoms and signs involving the musculoskeletal system  Stiffness of right shoulder, not elsewhere classified  Stiffness of left shoulder, not elsewhere classified  Other abnormalities of gait and mobility  Abnormal posture  Stiffness of right elbow, not elsewhere classified  Stiffness of left elbow, not elsewhere classified  Unsteadiness on feet  Other symptoms and signs involving the nervous system    Problem List Patient Active Problem List   Diagnosis Date Noted   Gait abnormality 08/29/2019   Midline low back pain without sciatica 08/29/2019   Mild cognitive impairment 08/29/2019   Excessive daytime sleepiness 01/24/2019   Nocturia more than twice per night 01/24/2019   Hypersomnia with sleep apnea 01/24/2019   At risk for central sleep apnea 01/24/2019   RLS (restless legs syndrome) 01/24/2019   Idiopathic Parkinson's disease ([place]) 05/07/2015   Polymyositis ([facility]) 05/07/2015    Alexander Thomas, OT/L 02/24/2021, 2:17 PM  [facility] [address] [place], [place], [address] Phone: [phone]   Fax:  [phone]  Name: ENNIO HOUP MRN: [id number] Date of Birth: [birth year]  [name], OTR/L [facility] [address] [place], [place]  [phone] phone [phone] 02/24/21 2:17 PM

## 2021-02-24 NOTE — Therapy (Signed)
[facility] [address] [place], [place], [address] Phone: [phone]   Fax:  [phone]  Physical Therapy Treatment  Patient Details  Name: Alexander Thomas MRN: [id number] Date of Birth: 1945/06/16 Referring Provider (PT): Dr. Krista Blue   Encounter Date: 02/24/2021   PT End of Session - 02/24/21 1206     Visit Number 6    Number of Visits 17    Date for PT Re-Evaluation 03/31/21    Authorization Type Medicare- part A & B    PT Start Time 1103    PT Stop Time 1144    PT Time Calculation (min) 41 min    Activity Tolerance Patient tolerated treatment well    Behavior During Therapy [facility] for tasks assessed/performed             Past Medical History:  Diagnosis Date   Hypercholesteremia    Migraine    Polymyositis ([place])    Raynaud disease    Tremor of right hand     Past Surgical History:  Procedure Laterality Date   CARPAL TUNNEL RELEASE Left    INGUINAL HERNIA REPAIR Bilateral    WRIST SURGERY Right     There were no vitals filed for this visit.   Subjective Assessment - 02/24/21 1105     Subjective No changes, no falls. Has been walking at home with his RW. Trying to do it everyday. Reports getting in and out of the bed is getting easier.    Pertinent History PD, inflammatory polymyositis, Raynaud's, CTR LUE, R wrist surgery, back pain;  L hip pain    Limitations Walking;House hold activities    Patient Stated Goals wants to know how much he might have slipped from last bout of therapy, wants to work on his problem areas    Currently in Pain? Yes    Pain Score 6     Pain Location Hip    Pain Orientation Left    Pain Descriptors / Indicators Sharp    Pain Type Chronic pain    Pain Onset More than a month ago    Aggravating Factors  nothing    Pain Relieving Factors pain meds, loosening it up                [facility] PT Assessment - 02/24/21 1111       Timed Up and Go Test   Normal TUG  (seconds) 16.75   with SPC   Cognitive TUG (seconds) 22.88   with SPC, starting at 77 and backwards by 3                          [facility] Adult PT Treatment/Exercise - 02/24/21 1115       Transfers   Transfers Sit to Stand;Stand to Sit    Sit to Stand 5: Supervision    Sit to Stand Details Verbal cues for technique;Tactile cues for weight beaing    Five time sit to stand comments  20.09 seconds with no UE support    Stand to Sit 5: Supervision    Stand to Sit Details (indicate cue type and reason) Verbal cues for technique    Comments an additional x5 reps without UE support with cues for incr nose over toes and tall posture in standing. Plus additional reps throughout session, does need cues for incr forward lean      Ambulation/Gait   Ambulation/Gait Yes    Ambulation/Gait Assistance 5:  Supervision    Ambulation/Gait Assistance Details Cues for posture and incr step length/foot clearance. Pt reports trying to work more on his walking with RW at home    Ambulation Distance (Feet) 115 Feet   plus additional distances   Assistive device Straight cane    Gait Pattern Step-through pattern;Decreased step length - right;Decreased step length - left;Decreased trunk rotation;Trunk flexed    Ambulation Surface Level;Indoor    Gait velocity 18.13 seconds with SPC = 1.81 ft/sec                Pt performs PWR! Moves in sitting at edge of mat:  Reviewed from HEP as pt had not been performing at home.    PWR! UP  2 x 10 reps, cues for smooth forward lean, L elbow extension and scapular retraction to upright posture. Cues for counting out loud when performing   PWR! Rock x 10 reps each side, added reach with arms.  Cues for open hands and looking up at hands.    PWR! Step (modified for seated marching in place), x10 reps each side.   Reviewed function and purpose of each exercise.    Performed standing lateral weight shifting 2 x 10 reps with BUE support - cued for  posture (added to HEP from last session).     PT Education - 02/24/21 1206     Education Details Adding more appts, progress towards goals, reviewed HEP.    Person(s) Educated Patient    Methods Explanation    Comprehension Verbalized understanding              PT Short Term Goals - 02/03/21 1248       PT SHORT TERM GOAL #1   Title Pt will perform initial HEP with wife's supervision in order to build upon functional gains made in therapy. ALL STGS DUE 01/28/21    Baseline 02/03/21 Pt able to perform initial HEP with supervision for some cues.    Time 4    Period Weeks    Status Achieved    Target Date 01/28/21      PT SHORT TERM GOAL #2   Title Pt will verbalize understanding of fall prevention in the home environment.    Baseline 02/03/21 Pt has been instructed in fall prevention strategies.    Time 4    Period Weeks    Status Achieved      PT SHORT TERM GOAL #3   Title Pt will improve 5x sit <> stand time without UE support to 20 seconds or less in order to demo improved balance/functional strength for transfers.    Baseline 24.56 seconds. 02/03/21 21.83 sec    Time 4    Period Weeks    Status Partially Met      PT SHORT TERM GOAL #4   Title Pt will improve gait speed with SPC vs. LRAD to at least 2.4 ft/sec in order to demo improved community mobility.    Baseline 2.11 ft/sec with SPC (pt holding cane at times). cane= 0.41ms or 1.68fsec,  with RW=[id number] [id number]    Time 4    Period Weeks    Status Not Met               PT Long Term Goals - 02/24/21 1205       PT LONG TERM GOAL #1   Title Pt will perform progression of  HEP with wife's cues/supervision, for improved strength, balance, transfers, and gait. ALL LTGS DUE  03/24/21    Baseline reviewed HEP today- pt has not been performing consistently at home. wife not present for education today.    Time 12    Period Weeks    Status On-going    Target Date 03/24/21      PT LONG TERM GOAL #2    Title Pt will improve 5x sit<>stand to less than or equal to 18 seconds for improved transfer efficiency and safety.    Baseline 24.56 seconds; 20.09 seconds with no UE support on 02/24/21    Time 12    Period Weeks    Status Revised      PT LONG TERM GOAL #3   Title Pt will decr cog TUG score to 25 seconds or less in order to demo decr fall risk/improved dual tasking.    Baseline 36.81 seconds with SPC, 22.88 seconds on 02/24/21    Time 12    Period Weeks    Status Achieved      PT LONG TERM GOAL #4   Title Pt will improve gait velocity to at least 2.3 ft/sec for improved gait efficiency and safety with SPC vs. RW    Baseline 2.11 ft/sec; 18.13 seconds with SPC = 1.81 ft/sec on 02/24/21    Time 12    Period Weeks    Status Revised      PT LONG TERM GOAL #5   Title Pt will verbalize understanding of local Parkinson's disease resources.    Time 12    Period Weeks    Status On-going      PT LONG TERM GOAL #6   Title Pt will recover posterior and anterior balance in push and release test in 2 or less steps independently, for improved balance recovery    Baseline anterior and posterior = 4 small, shuffled steps, needing min A in anterior direction    Time 12    Period Weeks    Status On-going            On-going LTGS:     PT Long Term Goals - 02/24/21 1205       PT LONG TERM GOAL #1   Title Pt will perform progression of  HEP with wife's cues/supervision, for improved strength, balance, transfers, and gait. ALL LTGS DUE 03/24/21    Baseline reviewed HEP today- pt has not been performing consistently at home. wife not present for education today.    Time 12    Period Weeks    Status On-going    Target Date 03/24/21      PT LONG TERM GOAL #2   Title Pt will improve 5x sit<>stand to less than or equal to 18 seconds for improved transfer efficiency and safety.    Baseline 24.56 seconds; 20.09 seconds with no UE support on 02/24/21    Time 12    Period Weeks    Status  Revised      PT LONG TERM GOAL #3   Title Pt will decr cog TUG score to 25 seconds or less in order to demo decr fall risk/improved dual tasking.    Baseline 36.81 seconds with SPC, 22.88 seconds on 02/24/21    Time 12    Period Weeks    Status Achieved      PT LONG TERM GOAL #4   Title Pt will improve gait velocity to at least 2.3 ft/sec for improved gait efficiency and safety with SPC vs. RW    Baseline 2.11 ft/sec; 18.13 seconds with SPC =  1.81 ft/sec on 02/24/21    Time 12    Period Weeks    Status Revised      PT LONG TERM GOAL #5   Title Pt will verbalize understanding of local Parkinson's disease resources.    Time 12    Period Weeks    Status On-going      PT LONG TERM GOAL #6   Title Pt will recover posterior and anterior balance in push and release test in 2 or less steps independently, for improved balance recovery    Baseline anterior and posterior = 4 small, shuffled steps, needing min A in anterior direction    Time 12    Period Weeks    Status On-going                 Plan - 02/24/21 1156     Clinical Impression Statement LTGs due this week, even though pt has only been seen for 5 tx sessions. Pt has had a delay in scheduling and has only been seen 1x a week. Will need to add additional appts through [place]. Attempted to try calling pt's wife to come in from the car for education/training, but had to leave a voicemail. Began to review pt's HEP today with seated PWR moves and standing lateral weight shifting as pt had not been consistently performing at home. Pt has tried to work on his walking program at home with RW with tall posture and incr stride length. Pt did meet LTG #3 in regards to cog TUG, improved time to 22.88 seconds (previously 36.81 seconds). Pt did not meet LTG #2 and #4. Pt improved 5x sit <> stand with no UE support to 20.09 seconds, but not quite to goal level. Dates updated for LTGs with ongoing LTGs as appropriate.    Personal Factors and  Comorbidities Comorbidity 3+;Time since onset of injury/illness/exacerbation;Past/Current Experience    Comorbidities PD, inflammatory polymyositis, Raynaud's, CTR LUE, R wrist surgery, back pain; L hip fracture 2/22 from fall, hx of L elbow fx due to fall 2/22 (surgery x2)    Examination-Activity Limitations Stand;Stairs;Transfers;Squat;Locomotion Level    Examination-Participation Restrictions Medication Management;Community Activity;Yard Work    [employer] Evolving/Moderate complexity    Rehab Potential Good    PT Frequency 2x / week    PT Duration 12 weeks    PT Treatment/Interventions ADLs/Self Care Home Management;DME Instruction;Gait training;Therapeutic activities;Stair training;Therapeutic exercise;Balance training;Contrast Bath;Neuromuscular re-education;Patient/family education;Functional mobility training;Vestibular;Energy conservation    PT Next Visit Plan sit <> stand training. working on gait training with RW with posture/incr stride length. standing balance- stepping strategies/weight shifting. Continue work on turning with large marching steps and wide U turns with incr stride length.  discuss seeing if his wife can come in to some future appts for incr carryover at home.    PT Home Exercise Plan seated PWR (no PWR! Twist due to hx of hip/pelvis fracture), [id number],sit to stands.    Consulted and Agree with Plan of Care Patient             Patient will benefit from skilled therapeutic intervention in order to improve the following deficits and impairments:  Abnormal gait, Decreased activity tolerance, Decreased balance, Decreased coordination, Decreased endurance, Decreased mobility, Decreased strength, Difficulty walking, Impaired flexibility, Postural dysfunction, Pain  Visit Diagnosis: Abnormal posture  Other lack of coordination  Other abnormalities of gait and mobility     Problem List Patient Active Problem List   Diagnosis Date Noted    Gait abnormality  08/29/2019   Midline low back pain without sciatica 08/29/2019   Mild cognitive impairment 08/29/2019   Excessive daytime sleepiness 01/24/2019   Nocturia more than twice per night 01/24/2019   Hypersomnia with sleep apnea 01/24/2019   At risk for central sleep apnea 01/24/2019   RLS (restless legs syndrome) 01/24/2019   Idiopathic Parkinson's disease ([place]) 05/07/2015   Polymyositis ([place]) 05/07/2015    Arliss Journey, PT, DPT  02/24/2021, 12:07 PM  [facility] [address] [place], [place], [address] Phone: [phone]   Fax:  [phone]  Name: Alexander Thomas MRN: [id number] Date of Birth: 1945/07/31

## 2021-02-26 ENCOUNTER — Ambulatory Visit: Payer: Medicare Other | Admitting: Physical Therapy

## 2021-02-26 ENCOUNTER — Encounter: Payer: Medicare Other | Admitting: Occupational Therapy

## 2021-03-03 ENCOUNTER — Encounter: Payer: Self-pay | Admitting: Physical Therapy

## 2021-03-03 ENCOUNTER — Other Ambulatory Visit: Payer: Self-pay

## 2021-03-03 ENCOUNTER — Ambulatory Visit: Payer: Medicare Other | Admitting: Occupational Therapy

## 2021-03-03 ENCOUNTER — Ambulatory Visit: Payer: Medicare Other

## 2021-03-03 ENCOUNTER — Encounter: Payer: Self-pay | Admitting: Occupational Therapy

## 2021-03-03 ENCOUNTER — Ambulatory Visit: Payer: Medicare Other | Admitting: Physical Therapy

## 2021-03-03 DIAGNOSIS — M25611 Stiffness of right shoulder, not elsewhere classified: Secondary | ICD-10-CM | POA: Diagnosis not present

## 2021-03-03 DIAGNOSIS — R278 Other lack of coordination: Secondary | ICD-10-CM | POA: Diagnosis not present

## 2021-03-03 DIAGNOSIS — R471 Dysarthria and anarthria: Secondary | ICD-10-CM

## 2021-03-03 DIAGNOSIS — M25622 Stiffness of left elbow, not elsewhere classified: Secondary | ICD-10-CM

## 2021-03-03 DIAGNOSIS — R2681 Unsteadiness on feet: Secondary | ICD-10-CM

## 2021-03-03 DIAGNOSIS — M25612 Stiffness of left shoulder, not elsewhere classified: Secondary | ICD-10-CM | POA: Diagnosis not present

## 2021-03-03 DIAGNOSIS — M25621 Stiffness of right elbow, not elsewhere classified: Secondary | ICD-10-CM

## 2021-03-03 DIAGNOSIS — R293 Abnormal posture: Secondary | ICD-10-CM | POA: Diagnosis not present

## 2021-03-03 DIAGNOSIS — R29898 Other symptoms and signs involving the musculoskeletal system: Secondary | ICD-10-CM | POA: Diagnosis not present

## 2021-03-03 DIAGNOSIS — R29818 Other symptoms and signs involving the nervous system: Secondary | ICD-10-CM

## 2021-03-03 DIAGNOSIS — R251 Tremor, unspecified: Secondary | ICD-10-CM

## 2021-03-03 DIAGNOSIS — R2689 Other abnormalities of gait and mobility: Secondary | ICD-10-CM | POA: Diagnosis not present

## 2021-03-03 NOTE — Therapy (Signed)
[facility] [address] [place], [place], [address] Phone: [phone]   Fax:  [phone]  Speech Language Pathology Treatment  Patient Details  Name: Alexander Thomas MRN: [id number] Date of Birth: 19-Oct-1945 Referring Provider (SLP): Marcial Pacas, MD   Encounter Date: 03/03/2021   End of Session - 03/03/21 1116     Visit Number 7    Number of Visits 25    Date for SLP Re-Evaluation 03/28/21    Authorization Type Medicare    SLP Start Time 1155    SLP Stop Time  1230    SLP Time Calculation (min) 35 min    Activity Tolerance Patient tolerated treatment well             Past Medical History:  Diagnosis Date   Hypercholesteremia    Migraine    Polymyositis ([facility])    Raynaud disease    Tremor of right hand     Past Surgical History:  Procedure Laterality Date   CARPAL TUNNEL RELEASE Left    INGUINAL HERNIA REPAIR Bilateral    WRIST SURGERY Right     There were no vitals filed for this visit.   Subjective Assessment - 03/03/21 1158     Subjective "I think it's improving"    Currently in Pain? Yes    Pain Score 4     Pain Location Hip                   ADULT SLP TREATMENT - 03/03/21 1116       General Information   Behavior/Cognition Alert;Cooperative;Pleasant mood      Treatment Provided   Treatment provided Cognitive-Linquistic      Cognitive-Linquistic Treatment   Treatment focused on Dysarthria    Skilled Treatment Pt indicated improvements in speech and usual practice at home (loud /a/ and reading aloud). Intermittent throat clearing noted, in which SLP educated and instructed small sips of water or hard swallow of saliva. Occasional cues required as intermittent throat clearing continued. Loud /a/ completed x5 with average of low 90s dB to recalibrate volume and effort for speaking. In conversation, volume averaged low 70s dB with short structured occasional hoarseness  exhibited. SLP instructed more frequent abdominal breathing via written cues to optimize breath support and vocal clarity, which did intermittently reduce the hoarseness. SLP recorded patient's voice, in which pt indicated hoarseness may be baseline. SLP still suggested increased frequency of breathing while conversing.      Assessment / Recommendations / Plan   Plan Continue with current plan of care      Progression Toward Goals   Progression toward goals Progressing toward goals              SLP Education - 03/03/21 1237     Education Details breath support to reduce hoarseness    Person(s) Educated Patient    Methods Explanation;Demonstration;Handout;Verbal cues    Comprehension Verbalized understanding;Returned demonstration;Need further instruction;Verbal cues required              SLP Short Term Goals - 02/17/21 1403       SLP SHORT TERM GOAL #1   Title Pt will complete dysarthria HEP given occasional min A over 2 sessions    Time 4    Period Weeks    Status Partially Met    Target Date 02/17/21   extended as pt started tx 2 weeks after eval     SLP SHORT TERM GOAL #2  Title Pt will produce loud /a/ or "hey!" with at least low 90s dB average over three sessions    Baseline 02-03-21    Time 4    Period Weeks    Status Partially Met    Target Date 02/17/21      SLP SHORT TERM GOAL #3   Title Pt will demo abdominal breathing at rest and during sentence responses with 80% accuracy given occasional min A over 2 sessions    Baseline 02-17-21    Time 4    Period Weeks    Status Partially Met    Target Date 02/17/21      SLP SHORT TERM GOAL #4   Title Pt will maintain average of 70 DB in Q and A using compensations given occasional min A across 3 sessions    Time 4    Period Weeks    Status Not Met    Target Date 02/17/21      SLP SHORT TERM GOAL #5   Title Pt will demonstrate dysarthria compensations in 15 minute mod complex conversation with occasional  min A over 2 sessions    Time 4    Period Weeks    Status Not Met    Target Date 02/17/21              SLP Long Term Goals - 03/03/21 1117       SLP LONG TERM GOAL #1   Title Pt will complete dysarthria HEP given rare min A over 2 sessions    Time 8    Period Weeks    Status Partially Met    Target Date 02/28/21      SLP LONG TERM GOAL #2   Title Pt will maintain average of 70 DB in 30 conversation with rare min A across 3 sessions    Time 12    Period Weeks    Status On-going    Target Date 03/28/21      SLP LONG TERM GOAL #3   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 Pt was unaccompanied today. Ongoing education and training completed for implementation of dysarthria compensations and HEP. Loud /a/ averaged low 90s dB. Reading at sentence level averaged low 70s dB. Increased hoarseness exhibited during structured tasks and spontaneous speech today. Usual cues required to maximize breath support to reduce hoarseness and intermittent cues to optimize loudness in conversation. 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    Speech Therapy Frequency 2x / week    Duration 12 weeks   12 weeks to account for scheduling and missed visits   Treatment/Interventions Compensatory strategies;Functional tasks;Patient/family  education;Compensatory techniques;Internal/external aids;SLP instruction and feedback    Potential to  Achieve Goals Good    SLP Home Exercise Plan provided    Consulted and Agree with Plan of Care Patient             Patient will benefit from skilled therapeutic intervention in order to improve the following deficits and impairments:   Dysarthria and anarthria    Problem List Patient Active Problem List   Diagnosis Date Noted   Gait abnormality 08/29/2019   Midline low back pain without sciatica 08/29/2019   Mild cognitive impairment 08/29/2019   Excessive daytime sleepiness 01/24/2019   Nocturia more than twice per night 01/24/2019   Hypersomnia with sleep apnea 01/24/2019   At risk for central sleep apnea 01/24/2019   RLS (restless legs syndrome) 01/24/2019   Idiopathic Parkinson's disease ([place]) 05/07/2015   Polymyositis ([place]) 05/07/2015    Alinda Deem, MA CCC-SLP 03/03/2021, 12:39 PM  [facility] [address] [place], [place], [address] Phone: [phone]   Fax:  [phone]   Name: WONG STEADHAM MRN: [id number] Date of Birth: Apr 09, 1946

## 2021-03-03 NOTE — Therapy (Signed)
[facility] [address] [place], [place], [address] Phone: [phone]   Fax:  [phone]  Physical Therapy Treatment  Patient Details  Name: Alexander Thomas MRN: [id number] Date of Birth: 12-Nov-1945 Referring Provider (PT): Dr. Krista Blue   Encounter Date: 03/03/2021   PT End of Session - 03/03/21 1324     Visit Number 7    Number of Visits 17    Date for PT Re-Evaluation 03/31/21    Authorization Type Medicare- part A & B    PT Start Time 1232    PT Stop Time 1314    PT Time Calculation (min) 42 min    Activity Tolerance Patient tolerated treatment well    Behavior During Therapy [facility] for tasks assessed/performed             Past Medical History:  Diagnosis Date   Hypercholesteremia    Migraine    Polymyositis ([place])    Raynaud disease    Tremor of right hand     Past Surgical History:  Procedure Laterality Date   CARPAL TUNNEL RELEASE Left    INGUINAL HERNIA REPAIR Bilateral    WRIST SURGERY Right     There were no vitals filed for this visit.   Subjective Assessment - 03/03/21 1234     Subjective [place] out of the bed 2 nights ago. Was getting in the bed and then rolled off the edge. Has a bed rail to put on it, but has not put one on yet. Has worked on trying to increase his walking time at home with RW.    Pertinent History PD, inflammatory polymyositis, Raynaud's, CTR LUE, R wrist surgery, back pain;  L hip pain    Limitations Walking;House hold activities    Patient Stated Goals wants to know how much he might have slipped from last bout of therapy, wants to work on his problem areas    Currently in Pain? Yes    Pain Score 4     Pain Location Hip    Pain Orientation Left    Pain Descriptors / Indicators Sharp    Pain Type Chronic pain    Pain Onset More than a month ago    Aggravating Factors  norhing    Pain Relieving Factors ?pain meds, loosening it up                                [facility] Adult PT Treatment/Exercise - 03/03/21 1243       Transfers   Transfers Sit to Stand;Stand to Sit    Sit to Stand 5: Supervision    Sit to Stand Details Verbal cues for technique;Tactile cues for weight beaing    Stand to Sit 5: Supervision    Comments x10 reps from mat table, cues to scoot out towards edge and incr forward lean to stand.      Ambulation/Gait   Ambulation/Gait Yes    Ambulation/Gait Assistance 5: Supervision    Ambulation/Gait Assistance Details Cues for posture, proper cane placement, step length and foot clearance throughout.    Ambulation Distance (Feet) 230 Feet   x1   Assistive device Straight cane    Gait Pattern Step-through pattern;Decreased step length - right;Decreased step length - left;Decreased trunk rotation;Trunk flexed    Ambulation Surface Level;Indoor      Neuro Re-ed    Neuro Re-ed Details  At countertop: mod quadraped PWR Up x10  reps with verbal, demo, and tactile cues for technique including scap retraction opening up hands, standing PWR Rock reaching towards targets with wide BOS x10 reps each side (reaching in pt's available range when going towards L).                 Balance Exercises - 03/03/21 1310       Balance Exercises: Standing   Stepping Strategy Anterior;UE support;10 reps;Limitations    Stepping Strategy Limitations Over 2" foam beam, x10 reps each leg cues for incr foot clearance, incr difficulty with stance time on LLE. No incr pain in L hip    Sidestepping Upper extremity support;4 reps;Limitations    Sidestepping Limitations Down and back 4 reps, peforming first in 12 steps, when cued for bigger steps, pt able to perform in 8 steps. Neds cues for foot clearance.    Other Standing Exercises Staggered stance A/P weight shifting x10 reps each leg - needing verbal,demo, manual cues for technique and weight shift. Cues for posture when shifting weight anteriorly.                   PT Short Term Goals - 02/03/21 1248       PT SHORT TERM GOAL #1   Title Pt will perform initial HEP with wife's supervision in order to build upon functional gains made in therapy. ALL STGS DUE 01/28/21    Baseline 02/03/21 Pt able to perform initial HEP with supervision for some cues.    Time 4    Period Weeks    Status Achieved    Target Date 01/28/21      PT SHORT TERM GOAL #2   Title Pt will verbalize understanding of fall prevention in the home environment.    Baseline 02/03/21 Pt has been instructed in fall prevention strategies.    Time 4    Period Weeks    Status Achieved      PT SHORT TERM GOAL #3   Title Pt will improve 5x sit <> stand time without UE support to 20 seconds or less in order to demo improved balance/functional strength for transfers.    Baseline 24.56 seconds. 02/03/21 21.83 sec    Time 4    Period Weeks    Status Partially Met      PT SHORT TERM GOAL #4   Title Pt will improve gait speed with SPC vs. LRAD to at least 2.4 ft/sec in order to demo improved community mobility.    Baseline 2.11 ft/sec with SPC (pt holding cane at times). cane= 0.25ms or 1.672fsec,  with RW=0.5897mor [id number]    Time 4    Period Weeks    Status Not Met               PT Long Term Goals - 02/24/21 1205       PT LONG TERM GOAL #1   Title Pt will perform progression of  HEP with wife's cues/supervision, for improved strength, balance, transfers, and gait. ALL LTGS DUE 03/24/21    Baseline reviewed HEP today- pt has not been performing consistently at home. wife not present for education today.    Time 12    Period Weeks    Status On-going    Target Date 03/24/21      PT LONG TERM GOAL #2   Title Pt will improve 5x sit<>stand to less than or equal to 18 seconds for improved transfer efficiency and safety.    Baseline 24.56 seconds;  20.09 seconds with no UE support on 02/24/21    Time 12    Period Weeks    Status Revised      PT LONG TERM GOAL  #3   Title Pt will decr cog TUG score to 25 seconds or less in order to demo decr fall risk/improved dual tasking.    Baseline 36.81 seconds with SPC, 22.88 seconds on 02/24/21    Time 12    Period Weeks    Status Achieved      PT LONG TERM GOAL #4   Title Pt will improve gait velocity to at least 2.3 ft/sec for improved gait efficiency and safety with SPC vs. RW    Baseline 2.11 ft/sec; 18.13 seconds with SPC = 1.81 ft/sec on 02/24/21    Time 12    Period Weeks    Status Revised      PT LONG TERM GOAL #5   Title Pt will verbalize understanding of local Parkinson's disease resources.    Time 12    Period Weeks    Status On-going      PT LONG TERM GOAL #6   Title Pt will recover posterior and anterior balance in push and release test in 2 or less steps independently, for improved balance recovery    Baseline anterior and posterior = 4 small, shuffled steps, needing min A in anterior direction    Time 12    Period Weeks    Status On-going                   Plan - 03/03/21 1631     Clinical Impression Statement Worked on gait training with cane today with pt needing cues for proper cane placement as pt with tendency to place it in front of RLE at times. Cues for step length and foot clearance esp with RLE. Continued to reinforce sit <> stand training with incr forward weight shifting and tall posture once in standing. Will continue to progress towards LTGs.    Personal Factors and Comorbidities Comorbidity 3+;Time since onset of injury/illness/exacerbation;Past/Current Experience    Comorbidities PD, inflammatory polymyositis, Raynaud's, CTR LUE, R wrist surgery, back pain; L hip fracture 2/22 from fall, hx of L elbow fx due to fall 2/22 (surgery x2)    Examination-Activity Limitations Stand;Stairs;Transfers;Squat;Locomotion Level    Examination-Participation Restrictions Medication Management;Community Activity;Yard Work    [employer] Evolving/Moderate  complexity    Rehab Potential Good    PT Frequency 2x / week    PT Duration 12 weeks    PT Treatment/Interventions ADLs/Self Care Home Management;DME Instruction;Gait training;Therapeutic activities;Stair training;Therapeutic exercise;Balance training;Contrast Bath;Neuromuscular re-education;Patient/family education;Functional mobility training;Vestibular;Energy conservation    PT Next Visit Plan sit <> stand training. working on gait training with RW and with cane, with posture/incr stride length. standing balance- stepping strategies/weight shifting. Continue work on turning with large marching steps and wide U turns with incr stride length. can pt's wife come in?    PT Home Exercise Plan seated PWR (no PWR! Twist due to hx of hip/pelvis fracture), [id number],sit to stands.    Consulted and Agree with Plan of Care Patient             Patient will benefit from skilled therapeutic intervention in order to improve the following deficits and impairments:  Abnormal gait, Decreased activity tolerance, Decreased balance, Decreased coordination, Decreased endurance, Decreased mobility, Decreased strength, Difficulty walking, Impaired flexibility, Postural dysfunction, Pain  Visit Diagnosis: Unsteadiness on feet  Other symptoms and signs  involving the musculoskeletal system  Abnormal posture     Problem List Patient Active Problem List   Diagnosis Date Noted   Gait abnormality 08/29/2019   Midline low back pain without sciatica 08/29/2019   Mild cognitive impairment 08/29/2019   Excessive daytime sleepiness 01/24/2019   Nocturia more than twice per night 01/24/2019   Hypersomnia with sleep apnea 01/24/2019   At risk for central sleep apnea 01/24/2019   RLS (restless legs syndrome) 01/24/2019   Idiopathic Parkinson's disease ([place]) 05/07/2015   Polymyositis ([place]) 05/07/2015    Arliss Journey, PT, DPT  03/03/2021, 4:33 PM  [facility] [address] [place], [place], [address] Phone: [phone]   Fax:  [phone]  Name: Alexander Thomas MRN: [id number] Date of Birth: 05/08/1945

## 2021-03-03 NOTE — Patient Instructions (Signed)
Think about both your volume and breathing while you're conversing. If your voice is fading or sounding hoarse/rough, take a breath.   Be mindful of clearing your throat. It can irritate your voice box. Instead, swallow your saliva hard or take a sip of water

## 2021-03-03 NOTE — Therapy (Signed)
[facility] [address] [place], [place], [address] Phone: [phone]   Fax:  [phone]  Occupational Therapy Treatment  Patient Details  Name: Alexander Thomas MRN: [id number] Date of Birth: Mar 29, 1946 Referring Provider (OT): Dr. Marcial Pacas   Encounter Date: 03/03/2021   OT End of Session - 03/03/21 1107     Visit Number 7    Number of Visits 25    Date for OT Re-Evaluation 03/31/21    Authorization Type Medicare & BCBS--Covered 100%.  cert. period 12/31/20-03/31/21    Authorization - Visit Number 7    Authorization - Number of Visits 10    Progress Note Due on Visit 10    OT Start Time 1106    OT Stop Time 1145    OT Time Calculation (min) 39 min    Activity Tolerance Patient tolerated treatment well    Behavior During Therapy WFL for tasks assessed/performed             Past Medical History:  Diagnosis Date   Hypercholesteremia    Migraine    Polymyositis ([facility])    Raynaud disease    Tremor of right hand     Past Surgical History:  Procedure Laterality Date   CARPAL TUNNEL RELEASE Left    INGUINAL HERNIA REPAIR Bilateral    WRIST SURGERY Right     There were no vitals filed for this visit.   Subjective Assessment - 03/03/21 1105     Subjective  took pain medication today (not sure of name)    Pertinent History Parkinson's Disease.  PMH:  hx of polymyositis, hx of orthostatic tachycardia, memory, Raynaud's disease, L CTR, R wrist fx surgery, L hip fracture 2/22 from fall, hx of L elbow fx due to fall 2/22 (surgery x2), sleep apnea, restless leg syndrome, hx of back pain, mild cognitive impairment.    Limitations fall risk    Patient Stated Goals move better    Currently in Pain? Yes    Pain Score 4     Pain Location Hip    Pain Orientation Left    Pain Descriptors / Indicators Sharp    Pain Type Acute pain    Pain Onset More than a month ago    Pain Frequency Constant    Aggravating  Factors  nothing    Pain Relieving Factors ?pain meds, loosening it up               Practiced scooping dried beans for simulated eating with min cueing for use of strategies after review (PWR! Hands, holding spoon in the middle) with improvement noted with use of strategies.  Practiced writing with use of strategies after review (PWR! Hands, use of tan foam grip, write big, forearm supported on table).  Pt wrote 5 sentences with good size, some difficulty with tremors, but utilizing strategies was effective.  Practiced using large amplitude movement strategies for donning/doffing jacket with min-mod cueing and repetition.  (Donning with 1 arm first, reach up for collar, big grasp, pull big, trunk rotation and feet apart).  Checked progress towards STGs.--see below.           OT Education - 03/03/21 1231     Education Details Memory Compensation Strategies; Ways to Keeping Thinking Skills [employer]) Educated Patient    Methods Explanation;Handout    Comprehension Verbalized understanding              OT Short Term Goals -  03/03/21 1114       OT SHORT TERM GOAL #1   Title Pt will be independent with PD-specific HEP-- check STGs 01/29/21    Time 4    Period Weeks    Status On-going   02/17/21:  needs cueing, review     OT SHORT TERM GOAL #2   Title Pt will verbalize understanding of ways to decr risk of complications related to PD.    Time 4    Period Weeks    Status On-going   02/17/21:  educated, but would benefit from review     OT Agency #3   Title Pt will improve ability to use utensils for eating and shown by improving time on PPT#2 by at least 4sec.    Baseline 24sec    Time 4    Period Weeks    Status On-going   03/03/21:  23.90sec     OT SHORT TERM GOAL #4   Title Pt will improve functional reaching/coordination for ADLs/IADLs as shown by improving score on box and blocks test by at least 3 bilaterally.    Baseline R-29 blocks,  L-27 blocks    Time 4    Period Weeks    Status Achieved   03/03/21:  R-32blocks, L-34blocks     OT SHORT TERM GOAL #5   Title Pt will verbalize understanding of memory compensation strategies and ways to keep thinking skills sharp.    Time 4    Period Weeks    Status Achieved   02/17/21:  not yet addressed.  03/03/21-met     OT SHORT TERM GOAL #6   Title Pt will be able to write at least 4 sentences with good legibility and only min decr in size.    Time 4    Period Weeks    Status Achieved               OT Long Term Goals - 12/31/20 1514       OT LONG TERM GOAL #1   Title Pt will verbalize understanding of adaptive strategies/AE to incr ease/safety with ADLs/IADLs.--check LTGs 03/31/21    Time 12    Period Weeks    Status New      OT LONG TERM GOAL #2   Title Pt will demo at least 110* shoulder flex with at least -50* elbow ext for improved overhead reaching with each UE.    Baseline R shoulder flex 100* with -55* elbow ext, L shoulder flex 105* with -60* elbow ext    Time 12    Period Weeks    Status New      OT LONG TERM GOAL #3   Title Pt will verbalize understanding of appropriate community resources/continuing fitness opportunities related to PD.    Time 12    Period Weeks    Status New      OT LONG TERM GOAL #4   Title Pt will be able to fastening/unfastening buttons in 57sec or less.    Baseline 67sec    Time 12    Period Weeks    Status New      OT LONG TERM GOAL #5   Title Pt will be able to don/doff jacket mod I in 49mn or less.    Baseline unable/needs assist    Time 12    Period Weeks    Status New      OT LONG TERM GOAL #6   Title Pt will improve bilateral hand  coordination for ADLs as shown by improving time on 9-hole peg test by at least 5sec bilaterally.    Baseline R-42.10sec, L-48.19sec    Time 12    Period Weeks    Status New                   Plan - 03/03/21 1108     Clinical Impression Statement Pt demo improved  coordination and met 3/6 STGs.  However, pt will need continued education and reinforcement for carryover wih progress slowed due decr frequency.    OT Occupational Profile and History Detailed Assessment- Review of Records and additional review of physical, cognitive, psychosocial history related to current functional performance    Occupational performance deficits (Please refer to evaluation for details): ADL's;IADL's;Work;Leisure;Social Participation    Body Structure / Function / Physical Skills ADL;Decreased knowledge of use of DME;Balance;Dexterity;Tone;UE functional use;IADL;ROM;Mobility;Coordination;Flexibility;Decreased knowledge of precautions;FMC;Improper spinal/pelvic alignment   bradykinesia   Cognitive Skills Attention;Memory    Rehab Potential Good    Clinical Decision Making Several treatment options, min-mod task modification necessary    Comorbidities Affecting Occupational Performance: May have comorbidities impacting occupational performance    Modification or Assistance to Complete Evaluation  Min-Moderate modification of tasks or assist with assess necessary to complete eval    OT Frequency 2x / week    OT Duration 12 weeks   +eval, may d/c after 8wks depending on progress   OT Treatment/Interventions Self-care/ADL training;DME and/or AE instruction;Balance training;Therapeutic activities;Aquatic Therapy;Therapeutic exercise;Cognitive remediation/compensation;Neuromuscular education;Functional Mobility Training;Passive range of motion;Patient/family education;Manual Therapy;Energy conservation;Moist Heat    Plan functional reaching with large amplitude movements, continue with strategies for ADLs/IADLs, practice jacket    Consulted and Agree with Plan of Care Patient             Patient will benefit from skilled therapeutic intervention in order to improve the following deficits and impairments:   Body Structure / Function / Physical Skills: ADL, Decreased knowledge of  use of DME, Balance, Dexterity, Tone, UE functional use, IADL, ROM, Mobility, Coordination, Flexibility, Decreased knowledge of precautions, FMC, Improper spinal/pelvic alignment (bradykinesia) Cognitive Skills: Attention, Memory     Visit Diagnosis: Other lack of coordination  Other symptoms and signs involving the musculoskeletal system  Stiffness of right shoulder, not elsewhere classified  Stiffness of left shoulder, not elsewhere classified  Stiffness of right elbow, not elsewhere classified  Stiffness of left elbow, not elsewhere classified  Unsteadiness on feet  Other symptoms and signs involving the nervous system  Abnormal posture  Tremor    Problem List Patient Active Problem List   Diagnosis Date Noted   Gait abnormality 08/29/2019   Midline low back pain without sciatica 08/29/2019   Mild cognitive impairment 08/29/2019   Excessive daytime sleepiness 01/24/2019   Nocturia more than twice per night 01/24/2019   Hypersomnia with sleep apnea 01/24/2019   At risk for central sleep apnea 01/24/2019   RLS (restless legs syndrome) 01/24/2019   Idiopathic Parkinson's disease ([place]) 05/07/2015   Polymyositis ([place]) 05/07/2015    Lateya Dauria, OT/L 03/03/2021, 12:35 PM  [facility] [address] [place], [place], [address] Phone: [phone]   Fax:  [phone]  Name: Alexander Thomas MRN: [id number] Date of Birth: [birth year]  [name], OTR/L [facility] [address] [place], [place]  [address] [phone] phone [phone] 03/03/21 12:35 PM '

## 2021-03-03 NOTE — Patient Instructions (Signed)
   Memory Compensation Strategies  Use "WARM" strategy. W= write it down A=  associate it R=  repeat it M=  make a mental picture  You can keep a Memory Notebook. Use a 3-ring notebook with sections for the following:  calendar, important names and phone numbers, medications, doctors' names/phone numbers, "to do list"/reminders, and a section to journal what you did each day  Use a calendar to write appointments down.  Write yourself a schedule for the day/make a routine This can be placed on the calendar or in a separate section of the Memory Notebook.  Keeping a regular schedule can help memory.  Use medication organizer with sections for each day or morning/evening pills  You may need help loading it  Keep a basket, or pegboard by the door.   Place items that you need to take out with you in the basket or on the pegboard.  You may also want to include a message board for reminders.  Use sticky notes. Place sticky notes with reminders in a place where the task is performed.  For example:  "turn off the stove" placed by the stove, "lock the door" placed on the door at eye level, "take your medications" on the bathroom mirror or by the place where you normally take your medications  Use alarms/timers.  Use while cooking to remind yourself to check on food or as a reminder to take your medicine, or as a reminder to make a call, or as a reminder to perform another task, etc.  Use a voice memo to record important information and notes for yourself.  10.  Organize and reduce clutter   Keeping Thinking Skills Sharp: 1. Jigsaw puzzles 2. Card/board games 3. Talking on the phone/social events 4. [employer] 5. Online games 6. Word serches/crossword puzzles 7.  Logic puzzles 8. Aerobic exercise (stationary bike) 9. Eating balanced diet (fruits & veggies) 10. Drink water 11. Try something new--new recipe, hobby 12. Crafts 13. Do a variety of activities that are challenging 14.  Add cognitive activities to walking/exercising (think of animal/food/city with each letter of the alphabet, counting backwards, thinking of as many vegetables as you can, etc.).--Only do this  If safe (no freezing/falls).

## 2021-03-04 ENCOUNTER — Ambulatory Visit: Payer: Medicare Other | Admitting: Occupational Therapy

## 2021-03-04 ENCOUNTER — Ambulatory Visit: Payer: Medicare Other | Admitting: Physical Therapy

## 2021-03-04 ENCOUNTER — Ambulatory Visit: Payer: Medicare Other

## 2021-03-05 ENCOUNTER — Ambulatory Visit: Payer: Medicare Other | Admitting: Physical Therapy

## 2021-03-05 ENCOUNTER — Encounter: Payer: Medicare Other | Admitting: Occupational Therapy

## 2021-03-24 ENCOUNTER — Other Ambulatory Visit: Payer: Self-pay

## 2021-03-24 ENCOUNTER — Ambulatory Visit: Payer: Medicare Other | Admitting: Occupational Therapy

## 2021-03-24 ENCOUNTER — Encounter: Payer: Self-pay | Admitting: Physical Therapy

## 2021-03-24 ENCOUNTER — Ambulatory Visit: Payer: Medicare Other | Admitting: Physical Therapy

## 2021-03-24 ENCOUNTER — Encounter: Payer: Self-pay | Admitting: Occupational Therapy

## 2021-03-24 ENCOUNTER — Ambulatory Visit: Payer: Medicare Other | Attending: Neurology

## 2021-03-24 DIAGNOSIS — R2689 Other abnormalities of gait and mobility: Secondary | ICD-10-CM | POA: Diagnosis not present

## 2021-03-24 DIAGNOSIS — M25612 Stiffness of left shoulder, not elsewhere classified: Secondary | ICD-10-CM | POA: Diagnosis not present

## 2021-03-24 DIAGNOSIS — M25611 Stiffness of right shoulder, not elsewhere classified: Secondary | ICD-10-CM | POA: Diagnosis not present

## 2021-03-24 DIAGNOSIS — R278 Other lack of coordination: Secondary | ICD-10-CM | POA: Diagnosis not present

## 2021-03-24 DIAGNOSIS — R29818 Other symptoms and signs involving the nervous system: Secondary | ICD-10-CM | POA: Insufficient documentation

## 2021-03-24 DIAGNOSIS — M25621 Stiffness of right elbow, not elsewhere classified: Secondary | ICD-10-CM

## 2021-03-24 DIAGNOSIS — R2681 Unsteadiness on feet: Secondary | ICD-10-CM | POA: Diagnosis not present

## 2021-03-24 DIAGNOSIS — R471 Dysarthria and anarthria: Secondary | ICD-10-CM

## 2021-03-24 DIAGNOSIS — M25622 Stiffness of left elbow, not elsewhere classified: Secondary | ICD-10-CM | POA: Insufficient documentation

## 2021-03-24 DIAGNOSIS — R251 Tremor, unspecified: Secondary | ICD-10-CM

## 2021-03-24 DIAGNOSIS — R293 Abnormal posture: Secondary | ICD-10-CM

## 2021-03-24 DIAGNOSIS — R41841 Cognitive communication deficit: Secondary | ICD-10-CM | POA: Diagnosis not present

## 2021-03-24 DIAGNOSIS — R29898 Other symptoms and signs involving the musculoskeletal system: Secondary | ICD-10-CM | POA: Diagnosis not present

## 2021-03-24 NOTE — Therapy (Signed)
[facility] [address] [place], [place], [address] Phone: [phone]   Fax:  [phone]  Occupational Therapy Treatment  Patient Details  Name: Alexander Thomas MRN: [id number] Date of Birth: 04-06-1946 Referring Provider (OT): Dr. Marcial Pacas   Encounter Date: 03/24/2021   OT End of Session - 03/24/21 1108     Visit Number 8    Number of Visits 25    Date for OT Re-Evaluation 03/31/21    Authorization Type Medicare & BCBS--Covered 100%.  cert. period 12/31/20-03/31/21    Authorization - Visit Number 8    Authorization - Number of Visits 10    Progress Note Due on Visit 10    OT Start Time 1106    OT Stop Time 1145    OT Time Calculation (min) 39 min    Activity Tolerance Patient tolerated treatment well    Behavior During Therapy WFL for tasks assessed/performed             Past Medical History:  Diagnosis Date   Hypercholesteremia    Migraine    Polymyositis (HCC)    Raynaud disease    Tremor of right hand     Past Surgical History:  Procedure Laterality Date   CARPAL TUNNEL RELEASE Left    INGUINAL HERNIA REPAIR Bilateral    WRIST SURGERY Right     There were no vitals filed for this visit.   Subjective Assessment - 03/24/21 1107     Subjective  I feel like I can get up a little better    Pertinent History Parkinson's Disease.  PMH:  hx of polymyositis, hx of orthostatic tachycardia, memory, Raynaud's disease, L CTR, R wrist fx surgery, L hip fracture 2/22 from fall, hx of L elbow fx due to fall 2/22 (surgery x2), sleep apnea, restless leg syndrome, hx of back pain, mild cognitive impairment.    Limitations fall risk    Patient Stated Goals move better    Currently in Pain? Yes    Pain Score 3     Pain Location Hip    Pain Orientation Left    Pain Descriptors / Indicators Sharp    Pain Type Chronic pain    Pain Onset More than a month ago    Pain Frequency Constant    Aggravating  Factors  nothing    Pain Relieving Factors ?pain meds, loosening it up              Sliding cards across table with each UE using PWR! Hands with min-mod cueing and mod difficulty.  Fastening/unfastening buttons with min cueing to use large amplitude movement strategies.    Practiced donning/doffing jacket x4 with adaptive strategies with min-mod cueing.  Pt did best with feet apart, holding collar and flipping coat overhead with big movements then placing in arms.  Removing cueing for with trunk rotation and hand placement.    Discussed progress with use of large amplitude movements at home/for ADLs.       OT Short Term Goals - 03/03/21 1114       OT SHORT TERM GOAL #1   Title Pt will be independent with PD-specific HEP-- check STGs 01/29/21    Time 4    Period Weeks    Status On-going   02/17/21:  needs cueing, review     OT SHORT TERM GOAL #2   Title Pt will verbalize understanding of ways to decr risk of complications related to PD.  Time 4    Period Weeks    Status On-going   02/17/21:  educated, but would benefit from review     OT Hunt #3   Title Pt will improve ability to use utensils for eating and shown by improving time on PPT#2 by at least 4sec.    Baseline 24sec    Time 4    Period Weeks    Status On-going   03/03/21:  23.90sec     OT SHORT TERM GOAL #4   Title Pt will improve functional reaching/coordination for ADLs/IADLs as shown by improving score on box and blocks test by at least 3 bilaterally.    Baseline R-29 blocks, L-27 blocks    Time 4    Period Weeks    Status Achieved   03/03/21:  R-32blocks, L-34blocks     OT SHORT TERM GOAL #5   Title Pt will verbalize understanding of memory compensation strategies and ways to keep thinking skills sharp.    Time 4    Period Weeks    Status Achieved   02/17/21:  not yet addressed.  03/03/21-met     OT SHORT TERM GOAL #6   Title Pt will be able to write at least 4 sentences with good  legibility and only min decr in size.    Time 4    Period Weeks    Status Achieved               OT Long Term Goals - 12/31/20 1514       OT LONG TERM GOAL #1   Title Pt will verbalize understanding of adaptive strategies/AE to incr ease/safety with ADLs/IADLs.--check LTGs 03/31/21    Time 12    Period Weeks    Status New      OT LONG TERM GOAL #2   Title Pt will demo at least 110* shoulder flex with at least -50* elbow ext for improved overhead reaching with each UE.    Baseline R shoulder flex 100* with -55* elbow ext, L shoulder flex 105* with -60* elbow ext    Time 12    Period Weeks    Status New      OT LONG TERM GOAL #3   Title Pt will verbalize understanding of appropriate community resources/continuing fitness opportunities related to PD.    Time 12    Period Weeks    Status New      OT LONG TERM GOAL #4   Title Pt will be able to fastening/unfastening buttons in 57sec or less.    Baseline 67sec    Time 12    Period Weeks    Status New      OT LONG TERM GOAL #5   Title Pt will be able to don/doff jacket mod I in 3mn or less.    Baseline unable/needs assist    Time 12    Period Weeks    Status New      OT LONG TERM GOAL #6   Title Pt will improve bilateral hand coordination for ADLs as shown by improving time on 9-hole peg test by at least 5sec bilaterally.    Baseline R-42.10sec, L-48.19sec    Time 12    Period Weeks    Status New                   Plan - 03/24/21 1109     Clinical Impression Statement Pt will need continued education and reinforcement for carryover wih  progress slowed due decr frequency.  Pt with improved ability to don/doff jacket today, but continues to be challenging and pt needs incr time.    OT Occupational Profile and History Detailed Assessment- Review of Records and additional review of physical, cognitive, psychosocial history related to current functional performance    Occupational performance deficits  (Please refer to evaluation for details): ADL's;IADL's;Work;Leisure;Social Participation    Body Structure / Function / Physical Skills ADL;Decreased knowledge of use of DME;Balance;Dexterity;Tone;UE functional use;IADL;ROM;Mobility;Coordination;Flexibility;Decreased knowledge of precautions;FMC;Improper spinal/pelvic alignment   bradykinesia   Cognitive Skills Attention;Memory    Rehab Potential Good    Clinical Decision Making Several treatment options, min-mod task modification necessary    Comorbidities Affecting Occupational Performance: May have comorbidities impacting occupational performance    Modification or Assistance to Complete Evaluation  Min-Moderate modification of tasks or assist with assess necessary to complete eval    OT Frequency 2x / week    OT Duration 12 weeks   +eval, may d/c after 8wks depending on progress   OT Treatment/Interventions Self-care/ADL training;DME and/or AE instruction;Balance training;Therapeutic activities;Aquatic Therapy;Therapeutic exercise;Cognitive remediation/compensation;Neuromuscular education;Functional Mobility Training;Passive range of motion;Patient/family education;Manual Therapy;Energy conservation;Moist Heat    Plan functional reaching with large amplitude movements, continue with strategies for ADLs/IADLs    Consulted and Agree with Plan of Care Patient             Patient will benefit from skilled therapeutic intervention in order to improve the following deficits and impairments:   Body Structure / Function / Physical Skills: ADL, Decreased knowledge of use of DME, Balance, Dexterity, Tone, UE functional use, IADL, ROM, Mobility, Coordination, Flexibility, Decreased knowledge of precautions, FMC, Improper spinal/pelvic alignment (bradykinesia) Cognitive Skills: Attention, Memory     Visit Diagnosis: Other symptoms and signs involving the musculoskeletal system  Unsteadiness on feet  Abnormal posture  Other lack of  coordination  Stiffness of right shoulder, not elsewhere classified  Stiffness of left shoulder, not elsewhere classified  Stiffness of right elbow, not elsewhere classified  Stiffness of left elbow, not elsewhere classified  Other symptoms and signs involving the nervous system  Tremor  Other abnormalities of gait and mobility    Problem List Patient Active Problem List   Diagnosis Date Noted   Gait abnormality 08/29/2019   Midline low back pain without sciatica 08/29/2019   Mild cognitive impairment 08/29/2019   Excessive daytime sleepiness 01/24/2019   Nocturia more than twice per night 01/24/2019   Hypersomnia with sleep apnea 01/24/2019   At risk for central sleep apnea 01/24/2019   RLS (restless legs syndrome) 01/24/2019   Idiopathic Parkinson's disease ([place]) 05/07/2015   Polymyositis ([place]) 05/07/2015    Ladaija Dimino, OT 03/24/2021, 12:24 PM  [facility] [address] [place], [place], [address] Phone: [phone]   Fax:  [phone]  Name: Alexander Thomas MRN: [id number] Date of Birth: 04-Jun-1945  [name], OTR/L [facility] [address] [place], [place]  [phone] phone [phone] 03/24/21 12:24 PM

## 2021-03-24 NOTE — Therapy (Signed)
[facility] [address] [place], [place], [address] Phone: [phone]   Fax:  [phone]  Speech Language Pathology Treatment/Recert  Patient Details  Name: Alexander Thomas MRN: [id number] Date of Birth: Jul 16, 1945 Referring Provider (SLP): Marcial Pacas, MD   Encounter Date: 03/24/2021   End of Session - 03/24/21 1253     Visit Number 8    Number of Visits 25    Date for SLP Re-Evaluation 04/25/21    Authorization Type Medicare    SLP Start Time 1232    SLP Stop Time  1315    SLP Time Calculation (min) 43 min    Activity Tolerance Patient tolerated treatment well             Past Medical History:  Diagnosis Date   Hypercholesteremia    Migraine    Polymyositis (HCC)    Raynaud disease    Tremor of right hand     Past Surgical History:  Procedure Laterality Date   CARPAL TUNNEL RELEASE Left    INGUINAL HERNIA REPAIR Bilateral    WRIST SURGERY Right     There were no vitals filed for this visit.   Subjective Assessment - 03/24/21 1235     Subjective "I think it's been good"    Currently in Pain? Yes    Pain Score 3     Pain Location Hip    Pain Orientation Left                   ADULT SLP TREATMENT - 03/24/21 1236       General Information   Behavior/Cognition Alert;Cooperative;Pleasant mood      Treatment Provided   Treatment provided Cognitive-Linquistic      Cognitive-Linquistic Treatment   Treatment focused on Dysarthria    Skilled Treatment "We practiced my voice." Pt averaged WNL volume in opening conversation. 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 Loud /a/ completed x5 with average of low 90s dB. Oral reading and subsequent conversation averaged WNL volume with rare flucuations into upper 60s dB. Occasional hoarseness exhibited in conversation. SLP trialed SOVTE to target breath support during phonation. Pt exhibited difficulty coordinating  breathing and phonation as tasks progressed. SLP suggested SOVT as warm up prior to sermons.      Assessment / Recommendations / Plan   Plan Continue with current plan of care;Goals updated      Progression Toward Goals   Progression toward goals Progressing toward goals              SLP Education - 03/24/21 1253     Education Details HEP BID, breath support to reduce hoarseness    Person(s) Educated Patient    Methods Demonstration;Explanation;Verbal cues;Handout    Comprehension Verbalized understanding;Returned demonstration;Need further instruction              SLP Short Term Goals - 02/17/21 1403       SLP SHORT TERM GOAL #1   Title Pt will complete dysarthria HEP given occasional min A over 2 sessions    Time 4    Period Weeks    Status Partially Met    Target Date 02/17/21   extended as pt started tx 2 weeks after eval     SLP SHORT TERM GOAL #2   Title Pt will produce loud /a/ or "hey!" with at least low 90s dB average over three sessions    Baseline 02-03-21    Time 4  Period Weeks    Status Partially Met    Target Date 02/17/21      SLP SHORT TERM GOAL #3   Title Pt will demo abdominal breathing at rest and during sentence responses with 80% accuracy given occasional min A over 2 sessions    Baseline 02-17-21    Time 4    Period Weeks    Status Partially Met    Target Date 02/17/21      SLP SHORT TERM GOAL #4   Title Pt will maintain average of 70 DB in Q and A using compensations given occasional min A across 3 sessions    Time 4    Period Weeks    Status Not Met    Target Date 02/17/21      SLP SHORT TERM GOAL #5   Title Pt will demonstrate dysarthria compensations in 15 minute mod complex conversation with occasional min A over 2 sessions    Time 4    Period Weeks    Status Not Met    Target Date 02/17/21              SLP Long Term Goals - 03/24/21 1319       SLP LONG TERM GOAL #1   Title Pt will complete dysarthria HEP given  rare min A over 2 sessions    Status Partially Met      SLP LONG TERM GOAL #2   Title Pt will maintain average of 70 DB in 30 conversation with rare min A across 3 sessions    Time 4    Period Weeks    Status On-going   LTGs ongoing for recert   Target Date 87/68/11      SLP LONG TERM GOAL #3   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 Pt was unaccompanied today. Ongoing education and training completed for implementation of dysarthria compensations and HEP. Loud /a/ averaged low 90s dB. Oral reading at paragraph level averaged low 70s dB. Increased hoarseness exhibited during structured tasks and spontaneous speech today. SLP trialed SOVTE to maximize breath support to reduce hoarseness and as "warm up" prior to preaching sermon. Re-cert completed for 2x/week for 4 more weeks as pt is progressing towards ST goals. 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    Speech Therapy Frequency 2x / week    Duration 4 weeks   recert   Treatment/Interventions Compensatory strategies;Functional tasks;Patient/family education;Compensatory techniques;Internal/external aids;SLP instruction and feedback    Potential to Achieve Goals Good    SLP Home Exercise Plan provided    Consulted and Agree with Plan of  Care Patient  Patient will benefit from skilled therapeutic intervention in order to improve the following deficits and impairments:   Dysarthria and anarthria  Cognitive communication deficit    Problem List Patient Active Problem List   Diagnosis Date Noted   Gait abnormality 08/29/2019   Midline low back pain without sciatica 08/29/2019   Mild cognitive impairment 08/29/2019   Excessive daytime sleepiness 01/24/2019   Nocturia more than twice per night 01/24/2019   Hypersomnia with sleep apnea 01/24/2019   At risk for central sleep apnea 01/24/2019   RLS (restless legs syndrome) 01/24/2019   Idiopathic Parkinson's disease ([place]) 05/07/2015   Polymyositis ([place]) 05/07/2015    Alinda Deem, [place] 03/24/2021, 2:35 PM  [facility] [address] [place], [place], [address] Phone: [phone]   Fax:  [phone]   Name: JASPER HANF MRN: [id number] Date of Birth: 04-01-1946

## 2021-03-24 NOTE — Patient Instructions (Addendum)
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 I want to see how your voice is holding up over a longer period of time.  I would love to know if your family is noticing a hoarse voice at home or at church. Think about taking extra breaths if you notice your voice is hoarse.    For ~5 minutes below your sermon, warm up your voice with the "bubbles" -Blow bubbles into water cup via straw 2-3 times -Hum and make bubbles in water cup via straw 2-3 times  -Try to sing the national anthem while make bubbles into the water

## 2021-03-24 NOTE — Therapy (Addendum)
[facility] [address] [place], [place], [address] Phone: [phone]   Fax:  [phone]  Physical Therapy Treatment  Patient Details  Name: Alexander Thomas MRN: [id number] Date of Birth: [birth year] Referring Provider (PT): Dr. Krista Blue   Encounter Date: 03/24/2021   PT End of Session - 03/24/21 1318     Visit Number 8    Number of Visits 17    Date for PT Re-Evaluation 03/31/21    Authorization Type Medicare- part A & B    PT Start Time 2774    PT Stop Time 1358    PT Time Calculation (min) 41 min    Equipment Utilized During Treatment Gait belt    Activity Tolerance Patient tolerated treatment well    Behavior During Therapy WFL for tasks assessed/performed             Past Medical History:  Diagnosis Date   Hypercholesteremia    Migraine    Polymyositis (HCC)    Raynaud disease    Tremor of right hand     Past Surgical History:  Procedure Laterality Date   CARPAL TUNNEL RELEASE Left    INGUINAL HERNIA REPAIR Bilateral    WRIST SURGERY Right     There were no vitals filed for this visit.   Subjective Assessment - 03/24/21 1319     Subjective Reports he feels like he is moving better. No falls. Trying to do his exercises when he remembers to do them.    Pertinent History PD, inflammatory polymyositis, Raynaud's, CTR LUE, R wrist surgery, back pain;  L hip pain    Limitations Walking;House hold activities    Patient Stated Goals wants to know how much he might have slipped from last bout of therapy, wants to work on his problem areas    Currently in Pain? Yes    Pain Score 2     Pain Location Hip    Pain Orientation Left    Pain Descriptors / Indicators Sharp    Pain Type Chronic pain    Pain Onset More than a month ago    Aggravating Factors  nothing much    Pain Relieving Factors nothing much                   03/24/21 1321  Transfers  Transfers Sit to Stand;Stand to Sit  Sit  to Stand 5: Supervision  Sit to Stand Details Verbal cues for technique;Tactile cues for weight beaing  Stand to Sit 5: Supervision  Comments When performing throughout session, cues for incr forward lean to stand.  Ambulation/Gait  Ambulation/Gait Yes  Ambulation/Gait Assistance 5: Supervision  Ambulation/Gait Assistance Details x2 laps with cane with cues for posture, incr stride length/ foot clearance. Performed an additional x2 laps with RW with same cues, but pt able to demo improved foot clearance consistently with RW. Has been using RW for walking program at home.  Ambulation Distance (Feet) 230 Feet (x2)  Assistive device Straight cane;Rolling walker  Gait Pattern Step-through pattern;Decreased step length - right;Decreased step length - left;Decreased trunk rotation;Trunk flexed  Ambulation Surface Level;Indoor  Neuro Re-ed   Neuro Re-ed Details  At countertop: mod quadraped PWR Up x10 reps with verbal, demo, and tactile cues for technique including scap retraction opening up hands.             Seated PWR Up; 2 x 10 reps - cues to count out loud, for scap retraction, open hands and proper  technique.   Reviewed from HEP as pt reports he has not been performing at home and discussed importance of performing. Starred it in pt's HEP folder.      Balance Exercises - 03/24/21 1357       Balance Exercises: Standing   SLS with Vectors Solid surface;Limitations    SLS with Vectors Limitations Alternating SLS taps to 6" step x10 reps each side with 5 second hold for SLS with cues for upright posture/scapular retraction    Stepping Strategy Posterior;Anterior;UE support;10 reps;Limitations    Stepping Strategy Limitations Forward x10 reps each side; using colorful floor dot as target no UE support, with BUE support, posterior stepping x10 reps each side with initial cues for step length and weight shift.    Other Standing Exercises With BUE support: x10 reps heel toe raises,  cues for posture                  PT Short Term Goals - 02/03/21 1248       PT SHORT TERM GOAL #1   Title Pt will perform initial HEP with wife's supervision in order to build upon functional gains made in therapy. ALL STGS DUE 01/28/21    Baseline 02/03/21 Pt able to perform initial HEP with supervision for some cues.    Time 4    Period Weeks    Status Achieved    Target Date 01/28/21      PT SHORT TERM GOAL #2   Title Pt will verbalize understanding of fall prevention in the home environment.    Baseline 02/03/21 Pt has been instructed in fall prevention strategies.    Time 4    Period Weeks    Status Achieved      PT SHORT TERM GOAL #3   Title Pt will improve 5x sit <> stand time without UE support to 20 seconds or less in order to demo improved balance/functional strength for transfers.    Baseline 24.56 seconds. 02/03/21 21.83 sec    Time 4    Period Weeks    Status Partially Met      PT SHORT TERM GOAL #4   Title Pt will improve gait speed with SPC vs. LRAD to at least 2.4 ft/sec in order to demo improved community mobility.    Baseline 2.11 ft/sec with SPC (pt holding cane at times). cane= 0.35m/s or 1.16ft/sec,  with RW=0.82m/s or 1.37ft /sec    Time 4    Period Weeks    Status Not Met               PT Long Term Goals - 02/24/21 1205       PT LONG TERM GOAL #1   Title Pt will perform progression of  HEP with wife's cues/supervision, for improved strength, balance, transfers, and gait. ALL LTGS DUE 03/24/21    Baseline reviewed HEP today- pt has not been performing consistently at home. wife not present for education today.    Time 12    Period Weeks    Status On-going    Target Date 03/24/21      PT LONG TERM GOAL #2   Title Pt will improve 5x sit<>stand to less than or equal to 18 seconds for improved transfer efficiency and safety.    Baseline 24.56 seconds; 20.09 seconds with no UE support on 02/24/21    Time 12    Period Weeks    Status  Revised      PT LONG TERM GOAL #  3   Title Pt will decr cog TUG score to 25 seconds or less in order to demo decr fall risk/improved dual tasking.    Baseline 36.81 seconds with SPC, 22.88 seconds on 02/24/21    Time 12    Period Weeks    Status Achieved      PT LONG TERM GOAL #4   Title Pt will improve gait velocity to at least 2.3 ft/sec for improved gait efficiency and safety with SPC vs. RW    Baseline 2.11 ft/sec; 18.13 seconds with SPC = 1.81 ft/sec on 02/24/21    Time 12    Period Weeks    Status Revised      PT LONG TERM GOAL #5   Title Pt will verbalize understanding of local Parkinson's disease resources.    Time 12    Period Weeks    Status On-going      PT LONG TERM GOAL #6   Title Pt will recover posterior and anterior balance in push and release test in 2 or less steps independently, for improved balance recovery    Baseline anterior and posterior = 4 small, shuffled steps, needing min A in anterior direction    Time 12    Period Weeks    Status On-going                   Plan - 03/24/21 1632     Clinical Impression Statement Pt with improvement of cane placement today, but does need cues for incr stride length and RLE foot clearance when performing. Also worked on gait with RW with pt able to consistently perform incr stride length. Reviewed seated PWR Up from HEP and importance of exercise in regards to function as pt had not been performing at home. Discussed having pt's spouse come in to next session for carryover for home and to incr compliance with exercises.    Personal Factors and Comorbidities Comorbidity 3+;Time since onset of injury/illness/exacerbation;Past/Current Experience    Comorbidities PD, inflammatory polymyositis, Raynaud's, CTR LUE, R wrist surgery, back pain; L hip fracture 2/22 from fall, hx of L elbow fx due to fall 2/22 (surgery x2)    Examination-Activity Limitations Stand;Stairs;Transfers;Squat;Locomotion Level     Examination-Participation Restrictions Medication Management;Community Activity;Yard Work    [employer] Evolving/Moderate complexity    Rehab Potential Good    PT Frequency 2x / week    PT Duration 12 weeks    PT Treatment/Interventions ADLs/Self Care Home Management;DME Instruction;Gait training;Therapeutic activities;Stair training;Therapeutic exercise;Balance training;Contrast Bath;Neuromuscular re-education;Patient/family education;Functional mobility training;Vestibular;Energy conservation    PT Next Visit Plan need to check goals and recert. sit <> stand training. working on gait training with RW and with cane, with posture/incr stride length. postural exercise. standing balance- stepping strategies/weight shifting.  can pt's wife come in?    PT Home Exercise Plan seated PWR (no PWR! Twist due to hx of hip/pelvis fracture), [id number],sit to stands.    Consulted and Agree with Plan of Care Patient             Patient will benefit from skilled therapeutic intervention in order to improve the following deficits and impairments:  Abnormal gait, Decreased activity tolerance, Decreased balance, Decreased coordination, Decreased endurance, Decreased mobility, Decreased strength, Difficulty walking, Impaired flexibility, Postural dysfunction, Pain  Visit Diagnosis: Unsteadiness on feet  Abnormal posture  Other lack of coordination  Other symptoms and signs involving the nervous system     Problem List Patient Active Problem List   Diagnosis  Date Noted   Gait abnormality 08/29/2019   Midline low back pain without sciatica 08/29/2019   Mild cognitive impairment 08/29/2019   Excessive daytime sleepiness 01/24/2019   Nocturia more than twice per night 01/24/2019   Hypersomnia with sleep apnea 01/24/2019   At risk for central sleep apnea 01/24/2019   RLS (restless legs syndrome) 01/24/2019   Idiopathic Parkinson's disease ([place]) 05/07/2015   Polymyositis ([place])  05/07/2015    Arliss Journey, PT, DPT  03/24/2021, 4:33 PM  [facility] [address] [place], [place], [address] Phone: [phone]   Fax:  [phone]  Name: Alexander Thomas MRN: [id number] Date of Birth: 1945-12-10

## 2021-03-25 ENCOUNTER — Encounter: Payer: Medicare Other | Admitting: Occupational Therapy

## 2021-03-25 ENCOUNTER — Ambulatory Visit: Payer: Medicare Other | Admitting: Physical Therapy

## 2021-03-25 DIAGNOSIS — M810 Age-related osteoporosis without current pathological fracture: Secondary | ICD-10-CM | POA: Diagnosis not present

## 2021-03-25 DIAGNOSIS — Z23 Encounter for immunization: Secondary | ICD-10-CM | POA: Diagnosis not present

## 2021-03-25 DIAGNOSIS — Z79899 Other long term (current) drug therapy: Secondary | ICD-10-CM | POA: Diagnosis not present

## 2021-03-25 DIAGNOSIS — M3322 Polymyositis with myopathy: Secondary | ICD-10-CM | POA: Diagnosis not present

## 2021-03-25 DIAGNOSIS — G20 Parkinson's disease: Secondary | ICD-10-CM | POA: Diagnosis not present

## 2021-03-25 DIAGNOSIS — I7300 Raynaud's syndrome without gangrene: Secondary | ICD-10-CM | POA: Diagnosis not present

## 2021-03-25 DIAGNOSIS — M1990 Unspecified osteoarthritis, unspecified site: Secondary | ICD-10-CM | POA: Diagnosis not present

## 2021-03-31 ENCOUNTER — Encounter: Payer: Self-pay | Admitting: Occupational Therapy

## 2021-03-31 ENCOUNTER — Ambulatory Visit: Payer: Medicare Other | Admitting: Physical Therapy

## 2021-03-31 ENCOUNTER — Other Ambulatory Visit: Payer: Self-pay

## 2021-03-31 ENCOUNTER — Ambulatory Visit: Payer: Medicare Other

## 2021-03-31 ENCOUNTER — Ambulatory Visit: Payer: Medicare Other | Admitting: Occupational Therapy

## 2021-03-31 ENCOUNTER — Encounter: Payer: Self-pay | Admitting: Physical Therapy

## 2021-03-31 DIAGNOSIS — M25621 Stiffness of right elbow, not elsewhere classified: Secondary | ICD-10-CM

## 2021-03-31 DIAGNOSIS — R29818 Other symptoms and signs involving the nervous system: Secondary | ICD-10-CM

## 2021-03-31 DIAGNOSIS — R293 Abnormal posture: Secondary | ICD-10-CM

## 2021-03-31 DIAGNOSIS — R471 Dysarthria and anarthria: Secondary | ICD-10-CM | POA: Diagnosis not present

## 2021-03-31 DIAGNOSIS — R2681 Unsteadiness on feet: Secondary | ICD-10-CM

## 2021-03-31 DIAGNOSIS — R2689 Other abnormalities of gait and mobility: Secondary | ICD-10-CM

## 2021-03-31 DIAGNOSIS — R41841 Cognitive communication deficit: Secondary | ICD-10-CM | POA: Diagnosis not present

## 2021-03-31 DIAGNOSIS — M25611 Stiffness of right shoulder, not elsewhere classified: Secondary | ICD-10-CM

## 2021-03-31 DIAGNOSIS — R251 Tremor, unspecified: Secondary | ICD-10-CM

## 2021-03-31 DIAGNOSIS — R278 Other lack of coordination: Secondary | ICD-10-CM

## 2021-03-31 DIAGNOSIS — M25622 Stiffness of left elbow, not elsewhere classified: Secondary | ICD-10-CM

## 2021-03-31 DIAGNOSIS — M25612 Stiffness of left shoulder, not elsewhere classified: Secondary | ICD-10-CM

## 2021-03-31 DIAGNOSIS — R29898 Other symptoms and signs involving the musculoskeletal system: Secondary | ICD-10-CM

## 2021-03-31 NOTE — Therapy (Deleted)
[facility] [address] [place], [place], [address] Phone: [phone]   Fax:  [phone]  Patient Details  Name: Alexander Thomas MRN: [id number] Date of Birth: [birth year] Referring Provider:  Mack Hook, MD  Encounter Date: 03/31/2021   [name], [place] 03/31/2021, 12:27 PM  [name] [address] [place], [place], [address] Phone: [phone]   Fax:  [place], OTR/L [facility] [address] [place], [place]  [address] [phone] phone [phone] 03/31/21 12:27 PM  '

## 2021-03-31 NOTE — Therapy (Signed)
[facility] [address] [place], [place], [address] Phone: [phone]   Fax:  [phone]  Occupational Therapy Treatment  Patient Details  Name: Alexander Thomas MRN: [id number] Date of Birth: [birth year] Referring Provider (OT): Dr. Marcial Pacas   Encounter Date: 03/31/2021   OT End of Session - 03/31/21 0938     Visit Number 9    Number of Visits 25   renewal completed 03/31/21   Date for OT Re-Evaluation 05/30/21    Authorization Type Medicare & BCBS--Covered 100%.  cert. period 12/31/20-03/31/21    Authorization - Visit Number 9   renewal/progress note complete on visit 9   Authorization - Number of Visits 19    Progress Note Due on Visit 9    OT Start Time [phone]    OT Stop Time 1015    OT Time Calculation (min) 38 min    Activity Tolerance Patient tolerated treatment well    Behavior During Therapy WFL for tasks assessed/performed             Past Medical History:  Diagnosis Date   Hypercholesteremia    Migraine    Polymyositis (HCC)    Raynaud disease    Tremor of right hand     Past Surgical History:  Procedure Laterality Date   CARPAL TUNNEL RELEASE Left    INGUINAL HERNIA REPAIR Bilateral    WRIST SURGERY Right     There were no vitals filed for this visit.   Subjective Assessment - 03/31/21 0937     Subjective  If I have time I can do anything.  It's getting better    Pertinent History Parkinson's Disease.  PMH:  hx of polymyositis, hx of orthostatic tachycardia, memory, Raynaud's disease, L CTR, R wrist fx surgery, L hip fracture 2/22 from fall, hx of L elbow fx due to fall 2/22 (surgery x2), sleep apnea, restless leg syndrome, hx of back pain, mild cognitive impairment.    Limitations fall risk    Patient Stated Goals move better    Currently in Pain? Yes    Pain Score 3     Pain Location Hip    Pain Orientation Left    Pain Descriptors / Indicators Sharp    Pain Type Chronic pain     Pain Onset More than a month ago    Pain Frequency Constant    Aggravating Factors  nothing    Pain Relieving Factors nothing              Reviewed large amplitude movement strategies for fastening/unfastening buttons then pt practiced/returned demo with min cueing for use of strategy and incr time.  Reviewed large amplitude movement strategies for donning/doffing jacket (feet apart, flip overhead and then put in arms, remove with trunk rotation, big hands).  Then pt returned demo/practiced with min cueing for use of strategy x2 with incr time.  Standing, PWR! Rock and reach to targets with min cueing for large amplitude and wt. Shift.  Standing, functional reaching with trunk rotation and wt. Shift with min cueing for large amplitude for grasp of cylinder objects with each UE.           OT Education - 03/31/21 1222     Education Details Reviewed adaptive/large amplitude movement strategies for butttoning and donning/doffing jacket and functional reach.  Instructed pt in strategies for eating including grasping end of utensil, turning utensil before lifting.    Person(s) Educated Patient;Spouse    Methods Explanation;Demonstration;Verbal  cues    Comprehension Verbalized understanding;Returned demonstration;Verbal cues required              OT Short Term Goals - 03/31/21 1223       OT SHORT TERM GOAL #1   Title Pt will be independent with PD-specific HEP-- check STGs 01/29/21    Time 4    Period Weeks    Status On-going   02/17/21:  needs cueing.  03/31/21:  would benefit from update     OT SHORT TERM GOAL #2   Title Pt will verbalize understanding of ways to decr risk of complications related to PD.    Time 4    Period Weeks    Status Achieved   02/17/21:  educated, but would benefit from review.  03/31/21 met     OT SHORT TERM GOAL #3   Title Pt will improve ability to use utensils for eating and shown by improving time on PPT#2 by at least 4sec.     Baseline 24sec    Time 4    Period Weeks    Status On-going   03/03/21:  23.90sec     OT SHORT TERM GOAL #4   Title Pt will improve functional reaching/coordination for ADLs/IADLs as shown by improving score on box and blocks test by at least 3 bilaterally.    Baseline R-29 blocks, L-27 blocks    Time 4    Period Weeks    Status Achieved   03/03/21:  R-32blocks, L-34blocks     OT SHORT TERM GOAL #5   Title Pt will verbalize understanding of memory compensation strategies and ways to keep thinking skills sharp.    Time 4    Period Weeks    Status Achieved   02/17/21:  not yet addressed.  03/03/21-met     OT SHORT TERM GOAL #6   Title Pt will be able to write at least 4 sentences with good legibility and only min decr in size.    Time 4    Period Weeks    Status Achieved               OT Long Term Goals - 03/31/21 1225       OT LONG TERM GOAL #1   Title Pt will verbalize understanding of adaptive strategies/AE to incr ease/safety with ADLs/IADLs.--check LTGs 03/31/21    Time 8    Period Weeks    Status On-going      OT LONG TERM GOAL #2   Title Pt will demo at least 110* shoulder flex with at least -50* elbow ext for improved overhead reaching with each UE.    Baseline R shoulder flex 100* with -55* elbow ext, L shoulder flex 105* with -60* elbow ext    Time 8    Period Weeks    Status On-going      OT LONG TERM GOAL #3   Title Pt will verbalize understanding of appropriate community resources/continuing fitness opportunities related to PD.    Time 8    Period Weeks    Status On-going   03/31/21:  not yet addressed     OT LONG TERM GOAL #4   Title Pt will be able to fastening/unfastening buttons in 57sec or less.    Baseline 67sec    Time 8    Period Weeks    Status On-going   03/31/21:  not met     OT LONG TERM GOAL #5   Title Pt will be able  to don/doff jacket mod I in 26mn or less.    Baseline unable/needs assist    Time 8    Period Weeks    Status  Revised   03/31/21:  not met, but no assist needed (revised goal)     OT LONG TERM GOAL #6   Title Pt will improve bilateral hand coordination for ADLs as shown by improving time on 9-hole peg test by at least 5sec bilaterally.    Baseline R-42.10sec, L-48.19sec    Time 8    Period Weeks    Status On-going                   Plan - 03/31/21 0939     Clinical Impression Statement Progress Note/Renewal Reporting Period 12/10/20-03/31/21:  Pt is slowly progressing towards goals, but progress has been affected due to severity of deficits and due to seen for decr frequency with scheduling conflicts and holidays.  Pt demo improved posture after stretching, coordination, and improved ADL performance per observation and pt/wife report.  Pt would benefit from continued occupational therapy to address bradykinesia, rigidity, posture, and coordination for incr ease with ADL/IADLs, incr carryover for quality of life, and continued PD-specific education.    OT Occupational Profile and History Detailed Assessment- Review of Records and additional review of physical, cognitive, psychosocial history related to current functional performance    Occupational performance deficits (Please refer to evaluation for details): ADL's;IADL's;Work;Leisure;Social Participation    Body Structure / Function / Physical Skills ADL;Decreased knowledge of use of DME;Balance;Dexterity;Tone;UE functional use;IADL;ROM;Mobility;Coordination;Flexibility;Decreased knowledge of precautions;FMC;Improper spinal/pelvic alignment   bradykinesia   Cognitive Skills Attention;Memory    Rehab Potential Good    Clinical Decision Making Several treatment options, min-mod task modification necessary    Comorbidities Affecting Occupational Performance: May have comorbidities impacting occupational performance    Modification or Assistance to Complete Evaluation  Min-Moderate modification of tasks or assist with assess necessary to complete  eval    OT Frequency 2x / week    OT Duration 12 weeks   +eval, may d/c after 8wks depending on progress   OT Treatment/Interventions Self-care/ADL training;DME and/or AE instruction;Balance training;Therapeutic activities;Aquatic Therapy;Therapeutic exercise;Cognitive remediation/compensation;Neuromuscular education;Functional Mobility Training;Passive range of motion;Patient/family education;Manual Therapy;Energy conservation;Moist Heat    Plan functional reaching with large amplitude movements, continue with strategies for ADLs/IADLs (continue unmet STGs and LTGs)    Consulted and Agree with Plan of Care Patient             Patient will benefit from skilled therapeutic intervention in order to improve the following deficits and impairments:   Body Structure / Function / Physical Skills: ADL, Decreased knowledge of use of DME, Balance, Dexterity, Tone, UE functional use, IADL, ROM, Mobility, Coordination, Flexibility, Decreased knowledge of precautions, FMC, Improper spinal/pelvic alignment (bradykinesia) Cognitive Skills: Attention, Memory     Visit Diagnosis: Other symptoms and signs involving the nervous system  Other symptoms and signs involving the musculoskeletal system  Stiffness of right shoulder, not elsewhere classified  Stiffness of left shoulder, not elsewhere classified  Other lack of coordination  Abnormal posture  Unsteadiness on feet  Stiffness of right elbow, not elsewhere classified  Stiffness of left elbow, not elsewhere classified  Tremor  Other abnormalities of gait and mobility    Problem List Patient Active Problem List   Diagnosis Date Noted   Gait abnormality 08/29/2019   Midline low back pain without sciatica 08/29/2019   Mild cognitive impairment 08/29/2019   Excessive daytime sleepiness 01/24/2019  Nocturia more than twice per night 01/24/2019   Hypersomnia with sleep apnea 01/24/2019   At risk for central sleep apnea 01/24/2019    RLS (restless legs syndrome) 01/24/2019   Idiopathic Parkinson's disease ([place]) 05/07/2015   Polymyositis ([place]) 05/07/2015    Priscila Bean, OT 03/31/2021, 12:48 PM  [facility] [address] [place], [place], [address] Phone: [phone]   Fax:  [phone]  Name: Alexander Thomas MRN: [id number] Date of Birth: 1945-12-29  [name], OTR/L [facility] [address] [place], [place]  [address] [phone] phone [phone] 03/31/21 12:48 PM

## 2021-03-31 NOTE — Therapy (Signed)
[facility] [address] [place], [place], [address] Phone: [phone]   Fax:  [phone]  Speech Language Pathology Treatment  Patient Details  Name: Alexander Thomas MRN: [id number] Date of Birth: 10-19-1945 Referring Provider (SLP): Alexander Pacas, MD   Encounter Date: 03/31/2021   End of Session - 03/31/21 1103     Visit Number 9    Number of Visits 25    Date for SLP Re-Evaluation 04/25/21    Authorization Type Medicare    SLP Start Time 1103    SLP Stop Time  1145    SLP Time Calculation (min) 42 min    Activity Tolerance Patient tolerated treatment well             Past Medical History:  Diagnosis Date   Hypercholesteremia    Migraine    Polymyositis (HCC)    Raynaud disease    Tremor of right hand     Past Surgical History:  Procedure Laterality Date   CARPAL TUNNEL RELEASE Left    INGUINAL HERNIA REPAIR Bilateral    WRIST SURGERY Right     There were no vitals filed for this visit.   Subjective Assessment - 03/31/21 1424     Subjective "this is my wife, Alexander Thomas"    Patient is accompained by: Family member    Currently in Pain? Yes    Pain Score 3     Pain Location Hip                   ADULT SLP TREATMENT - 03/31/21 1102       General Information   Behavior/Cognition Alert;Cooperative;Pleasant mood      Treatment Provided   Treatment provided Cognitive-Linquistic      Cognitive-Linquistic Treatment   Treatment focused on Dysarthria;Patient/family/caregiver education    Skilled Treatment Pt's wife, Alexander Thomas, present for today's session. SLP provided education and training of targeted tasks in [place] thus far, including dysarthria compensations/HEP, SOVT exercises, and throat clear alternatives. 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 Alexander Thomas also reported more hoarseness compared to baseline. SLP reviewed SOVT exercises and rationale, in which pt required intermittent mod A to complete today. SLP also reviewed throat clear alternatives and suggested avoiding mint/menthol which may contribute to throat irritation. Both verbalized understanding and agreement of recommendations, including dysathria HEP at least one time a time.      Assessment / Recommendations / Plan   Plan Continue with current plan of care     Progression Toward Goals   Progression toward goals Progressing toward goals              SLP Education - 03/31/21 1433     Education Details SOVTE, throat clear alternatives, HEP at least 1x/day    Person(s) Educated Patient;Spouse    Methods Explanation;Demonstration;Verbal cues;Handout    Comprehension Verbalized understanding;Returned demonstration;Need further instruction              SLP Short Term Goals - 02/17/21 1403       SLP SHORT TERM GOAL #1   Title Pt will complete dysarthria HEP given occasional min A over 2 sessions    Time 4    Period Weeks    Status Partially Met    Target Date 02/17/21        SLP SHORT TERM GOAL #2  Title Pt will produce loud /a/ or "hey!" with at least low 90s dB average over three sessions    Baseline 02-03-21    Time 4    Period Weeks    Status Partially Met    Target Date 02/17/21      SLP SHORT TERM GOAL #3   Title Pt will demo abdominal breathing at rest and during sentence responses with 80% accuracy given occasional min A over 2 sessions    Baseline 02-17-21    Time 4    Period Weeks    Status Partially Met    Target Date 02/17/21      SLP SHORT TERM GOAL #4   Title Pt will maintain average of 70 DB in Q and A using compensations given occasional min A across 3 sessions    Time 4    Period Weeks    Status Not Met    Target Date 02/17/21      SLP SHORT TERM GOAL #5   Title Pt will demonstrate dysarthria compensations  in 15 minute mod complex conversation with occasional min A over 2 sessions    Time 4    Period Weeks    Status Not Met    Target Date 02/17/21              SLP Long Term Goals - 03/31/21 1434       SLP LONG TERM GOAL #1   Title Pt will complete dysarthria HEP given rare min A over 2 sessions    Status Partially Met      SLP LONG TERM GOAL #2   Title Pt will maintain average of 70 DB in 30 conversation with rare min A across 3 sessions    Time 4    Period Weeks    Status On-going   LTGs ongoing for recert   Target Date 73/22/02      SLP LONG TERM GOAL #3   Title 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 Pt was accompanied by his wife, Alexander Thomas. Ongoing education and training completed for implementation of dysarthria compensations and HEP. SLP reviewed throat clear alternatives and SOVTE to maximize breath support to reduce hoarseness and as "warm up" prior to preaching sermon. 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    Speech Therapy Frequency 2x / week    Duration 4 weeks      Treatment/Interventions Compensatory strategies;Functional tasks;Patient/family education;Compensatory techniques;Internal/external aids;SLP instruction and feedback    Potential to Achieve Goals Good    SLP  Home Exercise Plan provided    Consulted and Agree with Plan of Care Patient  Patient will benefit from skilled therapeutic intervention in order to improve the following deficits and impairments:   Dysarthria and anarthria    Problem List Patient Active Problem List   Diagnosis Date Noted   Gait abnormality 08/29/2019   Midline low back pain without sciatica 08/29/2019   Mild cognitive impairment 08/29/2019   Excessive daytime sleepiness 01/24/2019   Nocturia more than twice per night 01/24/2019   Hypersomnia with sleep apnea 01/24/2019   At risk for central sleep apnea 01/24/2019   RLS (restless legs syndrome) 01/24/2019   Idiopathic Parkinson's disease ([place]) 05/07/2015   Polymyositis ([place]) 05/07/2015    Alinda Deem, [place] 03/31/2021, 2:35 PM  [facility] [address] [place], [place], [address] Phone: [phone]   Fax:  [phone]   Name: Alexander Thomas MRN: [id number] Date of Birth: 09/12/1945

## 2021-03-31 NOTE — Patient Instructions (Addendum)
Access Code: [email or web address] URL: [email or web address] Date: 03/31/2021 Prepared by: Janann August  Exercises Lateral Weight Shift - 1 x daily - 5 x weekly - 2 sets - 10 reps Alternating Step Forward with Support - 2 x daily - 5 x weekly - 1-2 sets - 10 reps   Sit to stand transfers  Seated PWR Up, [place], [employer]

## 2021-03-31 NOTE — Therapy (Addendum)
[facility] [address] [place], [place], [address] Phone: [phone]   Fax:  [phone]  Physical Therapy Treatment, Re-Cert, 55HR visit Progress Note  Patient Details  Name: Alexander Thomas MRN: [id number] Date of Birth: 11-21-1945 Referring Provider (PT): Dr. Krista Blue  10th Visit Physical Therapy Progress Note  Dates of Reporting Period: 12/31/20 to 03/31/21     Encounter Date: 03/31/2021   PT End of Session - 03/31/21 1154     Visit Number 9    Number of Visits 17    Date for PT Re-Evaluation 46/80/32   per re-cert on 04/12/81   Authorization Type Medicare- part A & B    Progress Note Due on Visit 19   performed 10th visit PN at visit 9   PT Start Time 1018    PT Stop Time 1101    PT Time Calculation (min) 43 min    Equipment Utilized During Treatment Gait belt    Activity Tolerance Patient tolerated treatment well    Behavior During Therapy WFL for tasks assessed/performed             Past Medical History:  Diagnosis Date   Hypercholesteremia    Migraine    Polymyositis (HCC)    Raynaud disease    Tremor of right hand     Past Surgical History:  Procedure Laterality Date   CARPAL TUNNEL RELEASE Left    INGUINAL HERNIA REPAIR Bilateral    WRIST SURGERY Right     There were no vitals filed for this visit.   Subjective Assessment - 03/31/21 1023     Subjective Reports his L hip feels like it is doing better.    Pertinent History PD, inflammatory polymyositis, Raynaud's, CTR LUE, R wrist surgery, back pain;  L hip pain    Limitations Walking;House hold activities    Patient Stated Goals wants to know how much he might have slipped from last bout of therapy, wants to work on his problem areas    Currently in Pain? Yes    Pain Score 3     Pain Location Hip    Pain Orientation Left    Pain Descriptors / Indicators Sharp    Pain Type Chronic pain    Pain Onset More than a month ago     Aggravating Factors  nothing    Pain Relieving Factors nothing                [facility] PT Assessment - 03/31/21 1025       Assessment   Medical Diagnosis Parkinson's Disease    Referring Provider (PT) Dr. Krista Blue    Onset Date/Surgical Date 12/10/20      Prior Function   Level of Independence Independent      Timed Up and Go Test   Normal TUG (seconds) 17.85   with SPC   Cognitive TUG (seconds) 30.13   starting at 77 and counting backwards by 3                          [facility] Adult PT Treatment/Exercise - 03/31/21 1032       Transfers   Transfers Sit to Stand;Stand to Sit    Sit to Stand 5: Supervision    Sit to Stand Details Verbal cues for technique;Tactile cues for weight beaing    Five time sit to stand comments  16 seconds with no UE support    Number of  Reps 2 sets   5 reps with no UE support, with focus on incr forward lean/nose over toes to stand     Ambulation/Gait   Ambulation/Gait Yes    Ambulation/Gait Assistance 5: Supervision    Ambulation/Gait Assistance Details During session with cues for posture and incr stride length. Occassional cues for proper cane placement    Assistive device Straight cane    Gait Pattern Step-through pattern;Decreased step length - right;Decreased step length - left;Decreased trunk rotation;Trunk flexed    Ambulation Surface Level;Indoor               Reviewed entirety of pt's HEP with pt's spouse present for improved compliance for home:   Access Code: [email or web address] URL: [email or web address] Date: 03/31/2021 Prepared by: Janann August  Exercises Lateral Weight Shift - 1 x daily - 5 x weekly - 2 sets - 10 reps Alternating Step Forward with Support - 2 x daily - 5 x weekly - 1-2 sets - 10 reps - new addition, cues for technique, weight shift, and incr foot clearance.  Sit to stands: with focus on wider BOS and incr forward weight shifting (BIG nose over toes) when standing.   Pt performs PWR!  Moves in sitting at edge of mat:   PWR! UP x 10 reps, cues for smooth forward lean,  elbow extension and scapular retraction to upright posture, opening up hands.   PWR! Rock x 10 reps each side, reaching across body with opening hands and cues for elbow extension    PWR! Step (modified for seated marching in place), 2 sets x 10 reps - cues to STOMP to incr intensity of marching.   Verbally reviewed with pt's spouse pt's walking program at home with RW with pt working on tall posture and incr stride length. Pt's spouse reports pt is performing this consistently at home.    PT Education - 03/31/21 1152     Education Details Progress towards goals, reviewed HEP and added in forward stepping for home, adding a few more weeks of appts due to pt's frequency only being 1x week due to wife' schedule.    Person(s) Educated Patient;Spouse    Methods Explanation;Demonstration;Handout    Comprehension Verbalized understanding;Returned demonstration              PT Short Term Goals - 04/01/21 0937       PT SHORT TERM GOAL #1   Title ALL STGS = LTGs               PT Long Term Goals - 03/31/21 1030       PT LONG TERM GOAL #1   Title Pt will perform progression of  HEP with wife's cues/supervision, for improved strength, balance, transfers, and gait. ALL LTGS DUE 03/24/21    Baseline reviewed entirety of HEP today with pt and pt's spouse for incr carryover for home,    Time 12    Period Weeks    Status Partially Met    Target Date 03/24/21      PT LONG TERM GOAL #2   Title Pt will improve 5x sit<>stand to less than or equal to 18 seconds for improved transfer efficiency and safety.    Baseline 24.56 seconds; 20.09 seconds with no UE support on 02/24/21; 16 seconds on 03/31/21    Time 12    Period Weeks    Status Achieved      PT LONG TERM GOAL #3   Title Pt will decr cog TUG score to  25 seconds or less in order to demo decr fall risk/improved dual tasking.    Baseline 36.81  seconds with SPC, 22.88 seconds on 02/24/21; 30.13 seconds on 03/31/21    Time 12    Period Weeks    Status Not Met      PT LONG TERM GOAL #4   Title Pt will improve gait velocity to at least 2.3 ft/sec for improved gait efficiency and safety with SPC vs. RW    Baseline 2.11 ft/sec; 18.13 seconds with SPC = 1.81 ft/sec on 02/24/21    Time 12    Period Weeks    Status On-going      PT LONG TERM GOAL #5   Title Pt will verbalize understanding of local Parkinson's disease resources.    Time 12    Period Weeks    Status On-going      PT LONG TERM GOAL #6   Title Pt will recover posterior and anterior balance in push and release test in 2 or less steps independently, for improved balance recovery    Baseline anterior and posterior = 4 small, shuffled steps, needing min A in anterior direction, anterior and posterior = 3 smaller steps on 03/31/21    Time 12    Period Weeks    Status Not Met            Revised/ongoing LTGs for re-cert:      PT Long Term Goals - 04/01/21 1439       PT LONG TERM GOAL #1   Title Pt will perform progression of  HEP with wife's cues/supervision, for improved strength, balance, transfers, and gait. ALL LTGS DUE 04/29/21    Baseline reviewed entirety of HEP today with pt and pt's spouse for incr carryover for home,    Time 4    Period Weeks    Status On-going    Target Date 04/29/21      PT LONG TERM GOAL #3   Title Pt will decr cog TUG score to 25 seconds or less in order to demo decr fall risk/improved dual tasking.    Baseline 36.81 seconds with SPC, 22.88 seconds on 02/24/21; 30.13 seconds on 03/31/21    Time 4    Period Weeks    Status On-going      PT LONG TERM GOAL #4   Title Pt will improve gait velocity to at least 2.3 ft/sec for improved gait efficiency and safety with SPC vs. RW    Baseline 2.11 ft/sec; 18.13 seconds with SPC = 1.81 ft/sec on 02/24/21    Time 4    Period Weeks    Status On-going      PT LONG TERM GOAL #5   Title Pt  will verbalize understanding of local Parkinson's disease resources.    Time 4    Period Weeks    Status On-going      PT LONG TERM GOAL #6   Title Pt will recover posterior and anterior balance in push and release test in 2 or less steps independently, for improved balance recovery    Baseline anterior and posterior = 4 small, shuffled steps, needing min A in anterior direction, anterior and posterior = 3 smaller steps on 03/31/21    Time 4    Period Weeks    Status On-going                Plan - 04/01/21 1601     Clinical Impression Statement 10th visit PN and re-cert: :  Pt's spouse present during today's session for education and review of pt's HEP for incr compliance at home. Checked pt's LTGs for re-cert today. Pt met LTG #2 for 5x sit <> stand, performed in 16 seconds with no UE support (previously was 20.09 seconds). Pt partially met goal with HEP - reviewed entirety of pt's HEP and added forward stepping strategy. Pt did not meet LTG #3 or #6. Pt performed cog TUG with cane in 30.13 seconds today, indicating improved difficulty with dual tasking/incr fall risk. With push and release test, pt performs anterior and posterior balance recovery with 3 small steps in each direction. Due to a delay in scheduling and pt's spouses schedule, pt has missed a couple weeks of therapy and some weeks has only been seen 1x week, with decr carryover at home. Will re-cert for additional visits to continue to work on posture, transfer training, gait, balance, strength, functional mobility in order to improve independence and decr pt's fall risk. LTGs updated as appropriate.    Personal Factors and Comorbidities Comorbidity 3+;Time since onset of injury/illness/exacerbation;Past/Current Experience    Comorbidities PD, inflammatory polymyositis, Raynaud's, CTR LUE, R wrist surgery, back pain; L hip fracture 2/22 from fall, hx of L elbow fx due to fall 2/22 (surgery x2)    Examination-Activity Limitations  Stand;Stairs;Transfers;Squat;Locomotion Level    Examination-Participation Restrictions Medication Management;Community Activity;Yard Work    [employer] Evolving/Moderate complexity    Rehab Potential Good    PT Frequency 2x / week    PT Duration 8 weeks    PT Treatment/Interventions ADLs/Self Care Home Management;DME Instruction;Gait training;Therapeutic activities;Stair training;Therapeutic exercise;Balance training;Contrast Bath;Neuromuscular re-education;Patient/family education;Functional mobility training;Vestibular;Energy conservation    PT Next Visit Plan sit <> stand training. working on gait training with RW and with cane, with posture/incr stride length. postural exercise. standing balance- stepping strategies (esp forward/posterior direction)/weight shifting.    PT Home Exercise Plan seated PWR (no PWR! Twist due to hx of hip/pelvis fracture), [email or web address],sit to stands.    Consulted and Agree with Plan of Care Patient             Patient will benefit from skilled therapeutic intervention in order to improve the following deficits and impairments:  Abnormal gait, Decreased activity tolerance, Decreased balance, Decreased coordination, Decreased endurance, Decreased mobility, Decreased strength, Difficulty walking, Impaired flexibility, Postural dysfunction, Pain  Visit Diagnosis: Other symptoms and signs involving the nervous system  Abnormal posture  Unsteadiness on feet  Other abnormalities of gait and mobility     Problem List Patient Active Problem List   Diagnosis Date Noted   Gait abnormality 08/29/2019   Midline low back pain without sciatica 08/29/2019   Mild cognitive impairment 08/29/2019   Excessive daytime sleepiness 01/24/2019   Nocturia more than twice per night 01/24/2019   Hypersomnia with sleep apnea 01/24/2019   At risk for central sleep apnea 01/24/2019   RLS (restless legs syndrome) 01/24/2019   Idiopathic Parkinson's  disease ([place]) 05/07/2015   Polymyositis ([place]) 05/07/2015    Arliss Journey, PT, DPT  04/01/2021, 2:38 PM  [facility] [address] [place], [place], [address] Phone: [phone]   Fax:  [phone]  Name: PETRO TALENT MRN: [id number] Date of Birth: [birth year]

## 2021-04-01 ENCOUNTER — Encounter: Payer: Medicare Other | Admitting: Occupational Therapy

## 2021-04-01 ENCOUNTER — Ambulatory Visit: Payer: Medicare Other | Admitting: Physical Therapy

## 2021-04-01 NOTE — Addendum Note (Signed)
Addended by: Arliss Journey on: 04/01/2021 02:41 PM   Modules accepted: Orders

## 2021-04-07 ENCOUNTER — Ambulatory Visit: Payer: Medicare Other | Admitting: Occupational Therapy

## 2021-04-07 ENCOUNTER — Ambulatory Visit: Payer: Medicare Other | Admitting: Physical Therapy

## 2021-04-07 ENCOUNTER — Other Ambulatory Visit: Payer: Self-pay

## 2021-04-07 ENCOUNTER — Ambulatory Visit: Payer: Medicare Other

## 2021-04-07 ENCOUNTER — Encounter: Payer: Self-pay | Admitting: Occupational Therapy

## 2021-04-07 ENCOUNTER — Encounter: Payer: Self-pay | Admitting: Physical Therapy

## 2021-04-07 DIAGNOSIS — R2689 Other abnormalities of gait and mobility: Secondary | ICD-10-CM

## 2021-04-07 DIAGNOSIS — R293 Abnormal posture: Secondary | ICD-10-CM | POA: Diagnosis not present

## 2021-04-07 DIAGNOSIS — R471 Dysarthria and anarthria: Secondary | ICD-10-CM

## 2021-04-07 DIAGNOSIS — R29818 Other symptoms and signs involving the nervous system: Secondary | ICD-10-CM

## 2021-04-07 DIAGNOSIS — R2681 Unsteadiness on feet: Secondary | ICD-10-CM

## 2021-04-07 DIAGNOSIS — R41841 Cognitive communication deficit: Secondary | ICD-10-CM | POA: Diagnosis not present

## 2021-04-07 DIAGNOSIS — R278 Other lack of coordination: Secondary | ICD-10-CM

## 2021-04-07 DIAGNOSIS — R251 Tremor, unspecified: Secondary | ICD-10-CM

## 2021-04-07 DIAGNOSIS — M25612 Stiffness of left shoulder, not elsewhere classified: Secondary | ICD-10-CM

## 2021-04-07 DIAGNOSIS — M25622 Stiffness of left elbow, not elsewhere classified: Secondary | ICD-10-CM

## 2021-04-07 DIAGNOSIS — R29898 Other symptoms and signs involving the musculoskeletal system: Secondary | ICD-10-CM

## 2021-04-07 DIAGNOSIS — M25611 Stiffness of right shoulder, not elsewhere classified: Secondary | ICD-10-CM

## 2021-04-07 DIAGNOSIS — M25621 Stiffness of right elbow, not elsewhere classified: Secondary | ICD-10-CM

## 2021-04-07 NOTE — Therapy (Signed)
[facility] [address] [place], [place], [address] Phone: [phone]   Fax:  [phone]  Occupational Therapy Treatment  Patient Details  Name: Alexander Thomas MRN: [id number] Date of Birth: May 15, 1945 Referring Provider (OT): Dr. Marcial Pacas   Encounter Date: 04/07/2021   OT End of Session - 04/07/21 1009     Visit Number 10    Number of Visits 25   renewal completed 03/31/21   Date for OT Re-Evaluation 05/30/21    Authorization Type Medicare & BCBS--Covered 100%.  cert. period 12/31/20-03/31/21    Authorization - Visit Number 10   renewal/progress note complete on visit 9   Authorization - Number of Visits 19    Progress Note Due on Visit 23    OT Start Time 1018    OT Stop Time 1100    OT Time Calculation (min) 42 min    Activity Tolerance Patient tolerated treatment well    Behavior During Therapy WFL for tasks assessed/performed             Past Medical History:  Diagnosis Date   Hypercholesteremia    Migraine    Polymyositis (HCC)    Raynaud disease    Tremor of right hand     Past Surgical History:  Procedure Laterality Date   CARPAL TUNNEL RELEASE Left    INGUINAL HERNIA REPAIR Bilateral    WRIST SURGERY Right     There were no vitals filed for this visit.   Subjective Assessment - 04/07/21 1008     Subjective  I feel like it's better (shoulders)    Pertinent History Parkinson's Disease.  PMH:  hx of polymyositis, hx of orthostatic tachycardia, memory, Raynaud's disease, L CTR, R wrist fx surgery, L hip fracture 2/22 from fall, hx of L elbow fx due to fall 2/22 (surgery x2), sleep apnea, restless leg syndrome, hx of back pain, mild cognitive impairment.    Limitations fall risk    Patient Stated Goals move better    Currently in Pain? Yes    Pain Score 2     Pain Location Hip    Pain Orientation Left    Pain Descriptors / Indicators Sharp    Pain Type Chronic pain    Pain Onset  More than a month ago    Pain Frequency Constant    Aggravating Factors  nothing    Pain Relieving Factors nothing               Supine, closed-chain shoulder flex with foam noodle  for stretch with BUEs with min cueing.  Sitting, functional reaching to flip cards with min cueing/focus on large amplitude movements (supination, shoulder ER, posture, trunk rotation and wt. Shift)  Donning/doffing jacket after review of adaptive strategy with large amplitude with pt's jacket, pt returned demo with min v.c.--checked LTG #5 (met)  Checked shoulder/elbow ROM--see LTG #2 (met)         OT Education - 04/07/21 1025     Education Details Reviewed PWR! supine (basic 4--step with bridging and marching) x10-20 each with min-mod cueing for large amplitude movements; PD community resources--handout provided    Person(s) Educated Patient;Spouse    Methods Explanation;Demonstration;Verbal cues;Handout    Comprehension Verbalized understanding;Returned demonstration;Verbal cues required              OT Short Term Goals - 03/31/21 1223       OT SHORT TERM GOAL #1   Title Pt will be independent with  PD-specific HEP-- check STGs 01/29/21    Time 4    Period Weeks    Status On-going   02/17/21:  needs cueing.  03/31/21:  would benefit from update     OT SHORT TERM GOAL #2   Title Pt will verbalize understanding of ways to decr risk of complications related to PD.    Time 4    Period Weeks    Status Achieved   02/17/21:  educated, but would benefit from review.  03/31/21 met     OT SHORT TERM GOAL #3   Title Pt will improve ability to use utensils for eating and shown by improving time on PPT#2 by at least 4sec.    Baseline 24sec    Time 4    Period Weeks    Status On-going   03/03/21:  23.90sec     OT SHORT TERM GOAL #4   Title Pt will improve functional reaching/coordination for ADLs/IADLs as shown by improving score on box and blocks test by at least 3 bilaterally.     Baseline R-29 blocks, L-27 blocks    Time 4    Period Weeks    Status Achieved   03/03/21:  R-32blocks, L-34blocks     OT SHORT TERM GOAL #5   Title Pt will verbalize understanding of memory compensation strategies and ways to keep thinking skills sharp.    Time 4    Period Weeks    Status Achieved   02/17/21:  not yet addressed.  03/03/21-met     OT SHORT TERM GOAL #6   Title Pt will be able to write at least 4 sentences with good legibility and only min decr in size.    Time 4    Period Weeks    Status Achieved               OT Long Term Goals - 04/07/21 1037       OT LONG TERM GOAL #1   Title Pt will verbalize understanding of adaptive strategies/AE to incr ease/safety with ADLs/IADLs.--check LTGs 03/31/21    Time 8    Period Weeks    Status On-going      OT LONG TERM GOAL #2   Title Pt will demo at least 110* shoulder flex with at least -50* elbow ext for improved overhead reaching with each UE.    Baseline R shoulder flex 100* with -55* elbow ext, L shoulder flex 105* with -60* elbow ext    Time 8    Period Weeks    Status Achieved   04/07/21:  R 120* with -40* elbow ext, L 120* with -40* elbow ext     OT LONG TERM GOAL #3   Title Pt will verbalize understanding of appropriate community resources/continuing fitness opportunities related to PD.    Time 8    Period Weeks    Status Achieved   03/31/21:  not yet addressed.  04/07/21 met     OT LONG TERM GOAL #4   Title Pt will be able to fastening/unfastening buttons in 57sec or less.    Baseline 67sec    Time 8    Period Weeks    Status On-going   03/31/21:  not met     OT LONG TERM GOAL #5   Title Pt will be able to don/doff jacket mod I in 22mn or less.    Baseline unable/needs assist    Time 8    Period Weeks    Status Achieved  03/31/21:  not met, but no assist needed (revised goal).  04/07/21:  14mn 19sec     OT LONG TERM GOAL #6   Title Pt will improve bilateral hand coordination for ADLs as shown  by improving time on 9-hole peg test by at least 5sec bilaterally.    Baseline R-42.10sec, L-48.19sec    Time 8    Period Weeks    Status On-going                   Plan - 04/07/21 1009     Clinical Impression Statement Pt demo improved bilateral shoulder and elbow ROM today as well as improved posture (particularly after stretching) and improved ability to don/doff jacket.    OT Occupational Profile and History Detailed Assessment- Review of Records and additional review of physical, cognitive, psychosocial history related to current functional performance    Occupational performance deficits (Please refer to evaluation for details): ADL's;IADL's;Work;Leisure;Social Participation    Body Structure / Function / Physical Skills ADL;Decreased knowledge of use of DME;Balance;Dexterity;Tone;UE functional use;IADL;ROM;Mobility;Coordination;Flexibility;Decreased knowledge of precautions;FMC;Improper spinal/pelvic alignment   bradykinesia   Cognitive Skills Attention;Memory    Rehab Potential Good    Clinical Decision Making Several treatment options, min-mod task modification necessary    Comorbidities Affecting Occupational Performance: May have comorbidities impacting occupational performance    Modification or Assistance to Complete Evaluation  Min-Moderate modification of tasks or assist with assess necessary to complete eval    OT Frequency 2x / week    OT Duration 12 weeks   +eval, may d/c after 8wks depending on progress   OT Treatment/Interventions Self-care/ADL training;DME and/or AE instruction;Balance training;Therapeutic activities;Aquatic Therapy;Therapeutic exercise;Cognitive remediation/compensation;Neuromuscular education;Functional Mobility Training;Passive range of motion;Patient/family education;Manual Therapy;Energy conservation;Moist Heat    Plan functional reaching with large amplitude movements, continue with strategies for ADLs/IADLs (continue unmet STGs and LTGs),  ?shoulder flex with papertowel roll in supine    Consulted and Agree with Plan of Care Patient             Patient will benefit from skilled therapeutic intervention in order to improve the following deficits and impairments:   Body Structure / Function / Physical Skills: ADL, Decreased knowledge of use of DME, Balance, Dexterity, Tone, UE functional use, IADL, ROM, Mobility, Coordination, Flexibility, Decreased knowledge of precautions, FMC, Improper spinal/pelvic alignment (bradykinesia) Cognitive Skills: Attention, Memory     Visit Diagnosis: Other symptoms and signs involving the nervous system  Abnormal posture  Unsteadiness on feet  Other symptoms and signs involving the musculoskeletal system  Stiffness of right shoulder, not elsewhere classified  Stiffness of left shoulder, not elsewhere classified  Other lack of coordination  Stiffness of right elbow, not elsewhere classified  Stiffness of left elbow, not elsewhere classified  Tremor    Problem List Patient Active Problem List   Diagnosis Date Noted   Gait abnormality 08/29/2019   Midline low back pain without sciatica 08/29/2019   Mild cognitive impairment 08/29/2019   Excessive daytime sleepiness 01/24/2019   Nocturia more than twice per night 01/24/2019   Hypersomnia with sleep apnea 01/24/2019   At risk for central sleep apnea 01/24/2019   RLS (restless legs syndrome) 01/24/2019   Idiopathic Parkinson's disease ([place] 05/07/2015   [place]ymyositis ([facility] 05/07/2015    [place] 04/07/2021, 12:51 PM  [place] [address]Phone: [phone]  Fax:  [phone] Name: RRYANE CANAVANMRN: [id number] of Birth: [birth year] ALevada Dy  Domingo Cocking, OTR/L [facility] [address] [place], [place]  [address] [phone] phone [phone] 04/07/21 12:51 PM

## 2021-04-07 NOTE — Therapy (Signed)
[place] [facility] [address] [place], [place], [address] Phone: [phone]   Fax:  [phone]  Physical Therapy Treatment  Patient Details  Name: Alexander Thomas MRN: [id number] Date of Birth: [birth year] Referring Provider (PT): Dr. Krista Blue   Encounter Date: 04/07/2021   PT End of Session - 04/07/21 1105     Visit Number 10    Number of Visits 17    Date for PT Re-Evaluation 50/09/38   per re-cert on 18/29/93   Authorization Type Medicare- part A & B    Progress Note Due on Visit 19   performed 10th visit PN at visit 9   PT Start Time 1105    PT Stop Time 1145    PT Time Calculation (min) 40 min    Equipment Utilized During Treatment Gait belt    Activity Tolerance Patient tolerated treatment well    Behavior During Therapy WFL for tasks assessed/performed             Past Medical History:  Diagnosis Date   Hypercholesteremia    Migraine    Polymyositis (HCC)    Raynaud disease    Tremor of right hand     Past Surgical History:  Procedure Laterality Date   CARPAL TUNNEL RELEASE Left    INGUINAL HERNIA REPAIR Bilateral    WRIST SURGERY Right     There were no vitals filed for this visit.   Subjective Assessment - 04/07/21 1106     Subjective No changes, no falls. Has been trying to do his exercises at home.    Pertinent History PD, inflammatory polymyositis, Raynaud's, CTR LUE, R wrist surgery, back pain;  L hip pain    Limitations Walking;House hold activities    Patient Stated Goals wants to know how much he might have slipped from last bout of therapy, wants to work on his problem areas    Currently in Pain? Yes    Pain Score 3     Pain Location Hip    Pain Orientation Left    Pain Type Chronic pain    Pain Onset More than a month ago    Aggravating Factors  Nothing    Pain Relieving Factors Nothing                               [facility] Adult PT Treatment/Exercise -  04/07/21 1112       Transfers   Transfers Sit to Stand;Stand to Sit    Sit to Stand 5: Supervision    Sit to Stand Details Verbal cues for technique;Tactile cues for weight beaing    Stand to Sit 5: Supervision    Number of Reps 1 set;10 reps    Transfer Cueing Cues for incr forward lean and scooting out to edge. Cues for tall posture once in standing.    Comments Plus an additional set of 8 reps on blue air ex with using BUE support      Ambulation/Gait   Ambulation/Gait Yes    Ambulation/Gait Assistance 5: Supervision    Ambulation/Gait Assistance Details x1 lap with cane with cues for tall posture, step length. Then 3 laps performing with dual tasking with pt naming foods in alphabetical order from A-Z. Cues to maintain posture and step length throughout. Pt needing cues at times to help think of a food or takes incr time.    Ambulation Distance (Feet) 460 Feet  Assistive device Straight cane    Gait Pattern Step-through pattern;Decreased step length - right;Decreased step length - left;Decreased trunk rotation;Trunk flexed    Ambulation Surface Level;Indoor      Exercises   Exercises Knee/Hip      Knee/Hip Exercises: Aerobic   Stepper SciFit Stepper with BLE/BUE at gear 1.5 for 6 minutes for ROM, activity tolerance, reciprocal movement. Cued for big movement patterns throughout                 Balance Exercises - 04/07/21 1142       Balance Exercises: Standing   Stepping Strategy Anterior;Posterior;UE support;Limitations    Stepping Strategy Limitations cues for foot clearance and weight shifting, x10 reps alternating each direction                PT Education - 04/07/21 1142     Education Details Adding more appts 1x week for 3 weeks throughout January (only able to do 1x week due to wife's schedule)    Person(s) Educated Patient    Methods Explanation    Comprehension Verbalized understanding              PT Short Term Goals - 04/01/21 0937        PT SHORT TERM GOAL #1   Title ALL STGS = LTGs               PT Long Term Goals - 04/01/21 1439       PT LONG TERM GOAL #1   Title Pt will perform progression of  HEP with wife's cues/supervision, for improved strength, balance, transfers, and gait. ALL LTGS DUE 04/29/21    Baseline reviewed entirety of HEP today with pt and pt's spouse for incr carryover for home,    Time 4    Period Weeks    Status On-going    Target Date 04/29/21      PT LONG TERM GOAL #3   Title Pt will decr cog TUG score to 25 seconds or less in order to demo decr fall risk/improved dual tasking.    Baseline 36.81 seconds with SPC, 22.88 seconds on 02/24/21; 30.13 seconds on 03/31/21    Time 4    Period Weeks    Status On-going      PT LONG TERM GOAL #4   Title Pt will improve gait velocity to at least 2.3 ft/sec for improved gait efficiency and safety with SPC vs. RW    Baseline 2.11 ft/sec; 18.13 seconds with SPC = 1.81 ft/sec on 02/24/21    Time 4    Period Weeks    Status On-going      PT LONG TERM GOAL #5   Title Pt will verbalize understanding of local Parkinson's disease resources.    Time 4    Period Weeks    Status On-going      PT LONG TERM GOAL #6   Title Pt will recover posterior and anterior balance in push and release test in 2 or less steps independently, for improved balance recovery    Baseline anterior and posterior = 4 small, shuffled steps, needing min A in anterior direction, anterior and posterior = 3 smaller steps on 03/31/21    Time 4    Period Weeks    Status On-going                   Plan - 04/07/21 1226     Clinical Impression Statement Continued to review proper technique with sit <>  stand transfers with cues for incr forward lean for initial couple of reps. Worked on cog dual tasking with gait with cane with pt having incr difficulty with this with slower gait speed. Needing cues throughout for posture and stride length and occassional cues to help  think of an object. Will continue to progress towards LTGs.    Personal Factors and Comorbidities Comorbidity 3+;Time since onset of injury/illness/exacerbation;Past/Current Experience    Comorbidities PD, inflammatory polymyositis, Raynaud's, CTR LUE, R wrist surgery, back pain; L hip fracture 2/22 from fall, hx of L elbow fx due to fall 2/22 (surgery x2)    Examination-Activity Limitations Stand;Stairs;Transfers;Squat;Locomotion Level    Examination-Participation Restrictions Medication Management;Community Activity;Yard Work    [employer] Evolving/Moderate complexity    Rehab Potential Good    PT Frequency 2x / week    PT Duration 8 weeks    PT Treatment/Interventions ADLs/Self Care Home Management;DME Instruction;Gait training;Therapeutic activities;Stair training;Therapeutic exercise;Balance training;Contrast Bath;Neuromuscular re-education;Patient/family education;Functional mobility training;Vestibular;Energy conservation    PT Next Visit Plan sit <> stand training. working on gait training with RW and with cane, with posture/incr stride length. cog dual tasking with gait. postural exercise. standing balance- stepping strategies (esp forward/posterior direction)/weight shifting.    PT Home Exercise Plan seated PWR (no PWR! Twist due to hx of hip/pelvis fracture), [id number],sit to stands.    Consulted and Agree with Plan of Care Patient             Patient will benefit from skilled therapeutic intervention in order to improve the following deficits and impairments:  Abnormal gait, Decreased activity tolerance, Decreased balance, Decreased coordination, Decreased endurance, Decreased mobility, Decreased strength, Difficulty walking, Impaired flexibility, Postural dysfunction, Pain  Visit Diagnosis: Other symptoms and signs involving the nervous system  Abnormal posture  Unsteadiness on feet  Other abnormalities of gait and mobility     Problem  List Patient Active Problem List   Diagnosis Date Noted   Gait abnormality 08/29/2019   Midline low back pain without sciatica 08/29/2019   Mild cognitive impairment 08/29/2019   Excessive daytime sleepiness 01/24/2019   Nocturia more than twice per night 01/24/2019   Hypersomnia with sleep apnea 01/24/2019   At risk for central sleep apnea 01/24/2019   RLS (restless legs syndrome) 01/24/2019   Idiopathic Parkinson's disease ([place]) 05/07/2015   Polymyositis ([place]) 05/07/2015    Arliss Journey, PT, DPT  04/07/2021, 12:29 PM  [place] [address] [place], [place], [address] Phone: [phone]   Fax:  [phone]  Name: Alexander Thomas MRN: [id number] Date of Birth: 1945/11/14

## 2021-04-07 NOTE — Patient Instructions (Addendum)
°  Loud "ahhh" x 5-10 times, twice a day if possible   Practice reading aloud 5-10 minutes each day  Focus on your breathing. If your voice is hoarse or low, breath more frequently to power your voice.

## 2021-04-07 NOTE — Therapy (Signed)
[facility] [address] [place], [place], [address] Phone: [phone]   Fax:  [phone]  Speech Language Pathology Treatment/Progress Note  Patient Details  Name: Alexander Thomas MRN: [id number] Date of Birth: 01/11/1946 Referring Provider (SLP): Marcial Pacas, MD   Encounter Date: 04/07/2021   End of Session - 04/07/21 1207     Visit Number 10    Number of Visits 25    Date for SLP Re-Evaluation 04/25/21    Authorization Type Medicare    SLP Start Time 1145    SLP Stop Time  1225    SLP Time Calculation (min) 40 min    Activity Tolerance Patient tolerated treatment well             Past Medical History:  Diagnosis Date   Hypercholesteremia    Migraine    Polymyositis (HCC)    Raynaud disease    Tremor of right hand     Past Surgical History:  Procedure Laterality Date   CARPAL TUNNEL RELEASE Left    INGUINAL HERNIA REPAIR Bilateral    WRIST SURGERY Right     There were no vitals filed for this visit.   Subjective Assessment - 04/07/21 1148     Subjective "I've been practicing"    Currently in Pain? Yes    Pain Score 3     Pain Location Hip              Speech Therapy Progress Note  Dates of Reporting Period: 12-31-20 to current  Objective Reports of Subjective Statement: Pt has been seen for 10 ST visits targeting dysarthria. 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  Objective Measurements: Pt exhibits improvements in vocal intensity and clarity given ST intervention. SLP cues still required to aid breath support, eliminate throat clearing, and reduce hoarseness in longer conversations.   Goal Update: see goals below  Plan: continue per POC  Reason Skilled Services are Required: pt is progressing towards ST goals so continue per POC to optimize carryover of targeted techniques and strategies       ADULT SLP TREATMENT -  04/07/21 1149       General Information   Behavior/Cognition Alert;Cooperative;Pleasant mood      Treatment Provided   Treatment provided Cognitive-Linquistic      Cognitive-Linquistic Treatment   Treatment focused on Dysarthria;Patient/family/caregiver education    Skilled Treatment More usual practice and completion of HEP reported since last session. Loud /a/ conducted with average of upper 80s dB with initial cue to increase loudness. Pt read/presented sermon with WNL volume exhibited. Subsequent conversational volume averaged WNL. Pt exhibited throat clears x3 during session, in which SLP cued use of throat clear alternatives instead to reduce larygngeal irritation.      Assessment / Recommendations / Plan   Plan Continue with current plan of care     Progression Toward Goals   Progression toward goals Progressing toward goals              SLP Education - 04/07/21 1206     Education Details HEP BID    Person(s) Educated Patient    Methods Explanation;Demonstration;Verbal cues;Handout    Comprehension Verbalized understanding;Returned demonstration;Need further instruction              SLP Short Term Goals - 02/17/21 1403       SLP SHORT TERM GOAL #1   Title Pt will complete dysarthria HEP given occasional min A  over 2 sessions    Time 4    Period Weeks    Status Partially Met    Target Date 02/17/21   extended as pt started tx 2 weeks after eval     SLP SHORT TERM GOAL #2   Title Pt will produce loud /a/ or "hey!" with at least low 90s dB average over three sessions    Baseline 02-03-21    Time 4    Period Weeks    Status Partially Met    Target Date 02/17/21      SLP SHORT TERM GOAL #3   Title Pt will demo abdominal breathing at rest and during sentence responses with 80% accuracy given occasional min A over 2 sessions    Baseline 02-17-21    Time 4    Period Weeks    Status Partially Met    Target Date 02/17/21      SLP SHORT TERM GOAL #4    Title Pt will maintain average of 70 DB in Q and A using compensations given occasional min A across 3 sessions    Time 4    Period Weeks    Status Not Met    Target Date 02/17/21      SLP SHORT TERM GOAL #5   Title Pt will demonstrate dysarthria compensations in 15 minute mod complex conversation with occasional min A over 2 sessions    Time 4    Period Weeks    Status Not Met    Target Date 02/17/21              SLP Long Term Goals - 04/07/21 1204       SLP LONG TERM GOAL #1   Title Pt will complete dysarthria HEP given rare min A over 2 sessions    Status Partially Met      SLP LONG TERM GOAL #2   Title Pt will maintain average of 70 DB in 30 conversation with rare min A across 3 sessions    Baseline 04-07-21    Time 4    Period Weeks    Status On-going     Target Date 04/25/21      SLP LONG TERM GOAL #3   Title 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 Ongoing education and training completed for implementation of dysarthria compensations and HEP. SLP reviewed throat clear alternatives and abdominal breathing to maximize breath support to reduce hoarseness. Pt reports some continued requested repetition while in community. 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    Speech Therapy Frequency 2x /  week    Duration 4 weeks  Treatment/Interventions Compensatory strategies;Functional tasks;Patient/family education;Compensatory techniques;Internal/external aids;SLP instruction and feedback    Potential to Achieve Goals Good    SLP Home Exercise Plan provided    Consulted and Agree with Plan of Care Patient             Patient will benefit from skilled therapeutic intervention in order to improve the following deficits and impairments:   Dysarthria and anarthria    Problem List Patient Active Problem List   Diagnosis Date Noted   Gait abnormality 08/29/2019   Midline low back pain without sciatica 08/29/2019   Mild cognitive impairment 08/29/2019   Excessive daytime sleepiness 01/24/2019   Nocturia more than twice per night 01/24/2019   Hypersomnia with sleep apnea 01/24/2019   At risk for central sleep apnea 01/24/2019   RLS (restless legs syndrome) 01/24/2019   Idiopathic Parkinson's disease ([place]) 05/07/2015   Polymyositis ([place]) 05/07/2015    Alinda Deem, [place] 04/07/2021, 12:35 PM  [facility] [address] [place], [place], [address] Phone: [phone]   Fax:  [phone]   Name: Alexander Thomas MRN: [id number] Date of Birth: [birth year]

## 2021-04-08 ENCOUNTER — Ambulatory Visit: Payer: Medicare Other | Admitting: Occupational Therapy

## 2021-04-08 ENCOUNTER — Ambulatory Visit: Payer: Medicare Other

## 2021-04-08 ENCOUNTER — Ambulatory Visit: Payer: Medicare Other | Admitting: Physical Therapy

## 2021-04-22 DIAGNOSIS — D485 Neoplasm of uncertain behavior of skin: Secondary | ICD-10-CM | POA: Diagnosis not present

## 2021-04-22 DIAGNOSIS — L570 Actinic keratosis: Secondary | ICD-10-CM | POA: Diagnosis not present

## 2021-04-22 DIAGNOSIS — L739 Follicular disorder, unspecified: Secondary | ICD-10-CM | POA: Diagnosis not present

## 2021-04-22 DIAGNOSIS — B351 Tinea unguium: Secondary | ICD-10-CM | POA: Diagnosis not present

## 2021-04-29 ENCOUNTER — Ambulatory Visit: Payer: Medicare Other | Attending: Family Medicine | Admitting: Physical Therapy

## 2021-04-29 ENCOUNTER — Other Ambulatory Visit: Payer: Self-pay

## 2021-04-29 ENCOUNTER — Ambulatory Visit: Payer: Medicare Other | Admitting: Occupational Therapy

## 2021-04-29 ENCOUNTER — Encounter: Payer: Self-pay | Admitting: Occupational Therapy

## 2021-04-29 ENCOUNTER — Encounter: Payer: Self-pay | Admitting: Physical Therapy

## 2021-04-29 DIAGNOSIS — R471 Dysarthria and anarthria: Secondary | ICD-10-CM | POA: Insufficient documentation

## 2021-04-29 DIAGNOSIS — M25612 Stiffness of left shoulder, not elsewhere classified: Secondary | ICD-10-CM | POA: Insufficient documentation

## 2021-04-29 DIAGNOSIS — R29898 Other symptoms and signs involving the musculoskeletal system: Secondary | ICD-10-CM | POA: Insufficient documentation

## 2021-04-29 DIAGNOSIS — R29818 Other symptoms and signs involving the nervous system: Secondary | ICD-10-CM

## 2021-04-29 DIAGNOSIS — R251 Tremor, unspecified: Secondary | ICD-10-CM | POA: Insufficient documentation

## 2021-04-29 DIAGNOSIS — R278 Other lack of coordination: Secondary | ICD-10-CM | POA: Insufficient documentation

## 2021-04-29 DIAGNOSIS — R293 Abnormal posture: Secondary | ICD-10-CM | POA: Insufficient documentation

## 2021-04-29 DIAGNOSIS — R2681 Unsteadiness on feet: Secondary | ICD-10-CM | POA: Insufficient documentation

## 2021-04-29 DIAGNOSIS — M25622 Stiffness of left elbow, not elsewhere classified: Secondary | ICD-10-CM | POA: Diagnosis not present

## 2021-04-29 DIAGNOSIS — R41841 Cognitive communication deficit: Secondary | ICD-10-CM | POA: Diagnosis not present

## 2021-04-29 DIAGNOSIS — R2689 Other abnormalities of gait and mobility: Secondary | ICD-10-CM | POA: Diagnosis not present

## 2021-04-29 DIAGNOSIS — M25621 Stiffness of right elbow, not elsewhere classified: Secondary | ICD-10-CM | POA: Insufficient documentation

## 2021-04-29 DIAGNOSIS — M25611 Stiffness of right shoulder, not elsewhere classified: Secondary | ICD-10-CM | POA: Diagnosis not present

## 2021-04-29 NOTE — Therapy (Addendum)
[facility] [address] [place], [place], [address] Phone: [phone]   Fax:  [phone]  Physical Therapy Treatment  Patient Details  Name: Alexander Thomas MRN: [id number] Date of Birth: April 14, 1946 Referring Provider (PT): Dr. Krista Blue   Encounter Date: 04/29/2021   PT End of Session - 04/29/21 1324     Visit Number 11    Number of Visits 17    Date for PT Re-Evaluation 36/62/94   per re-cert on 76/54/65   Authorization Type Medicare- part A & B    Progress Note Due on Visit 19   performed 10th visit PN at visit 9   PT Start Time 1320   pt running late from OT   PT Stop Time 1401    PT Time Calculation (min) 41 min    Equipment Utilized During Treatment Gait belt    Activity Tolerance Patient tolerated treatment well    Behavior During Therapy WFL for tasks assessed/performed             Past Medical History:  Diagnosis Date   Hypercholesteremia    Migraine    Polymyositis (HCC)    Raynaud disease    Tremor of right hand     Past Surgical History:  Procedure Laterality Date   CARPAL TUNNEL RELEASE Left    INGUINAL HERNIA REPAIR Bilateral    WRIST SURGERY Right     There were no vitals filed for this visit.   Subjective Assessment - 04/29/21 1321     Subjective Had a good Christmas/New Years. No falls. Pt reports doing some of his PT exercises.    Pertinent History PD, inflammatory polymyositis, Raynaud's, CTR LUE, R wrist surgery, back pain;  L hip pain    Limitations Walking;House hold activities    Patient Stated Goals wants to know how much he might have slipped from last bout of therapy, wants to work on his problem areas    Currently in Pain? Yes    Pain Score 2     Pain Location Hip    Pain Orientation Left    Pain Descriptors / Indicators Aching    Pain Type Chronic pain    Pain Onset More than a month ago    Aggravating Factors  nothing    Pain Relieving Factors nothing                                [facility] Adult PT Treatment/Exercise - 04/29/21 1345       Transfers   Transfers Sit to Stand;Stand to Sit    Sit to Stand 5: Supervision    Sit to Stand Details Verbal cues for technique;Tactile cues for weight beaing    Stand to Sit 5: Supervision    Comments During session, cues for scooting out towards edge, tucking feet under and incr forward lean to stand, performed approx. 5 reps      Ambulation/Gait   Ambulation/Gait Yes    Ambulation/Gait Assistance 5: Supervision    Ambulation/Gait Assistance Details Performed x2 laps of gait with RW with focus on posture and foot clearance/step length. During next 2 laps of gait performing with cognitive challenge of naming animals either in the ocean or in [place]. Needs cues throughout cog dual tasking for foot clearance and step length. Pt challenged by dual tasking. Remainder of gait with use of straight cane with continued cues for step length.  Ambulation Distance (Feet) 460 Feet    Assistive device Rolling walker;Straight cane    Gait Pattern Step-through pattern;Decreased step length - right;Decreased step length - left;Decreased trunk rotation;Trunk flexed    Ambulation Surface Level;Indoor    Gait velocity with RW = 22.94 seconds = 1.43 ft/sec    Gait Comments Reviewed walking program with pt for home with use of RW with continued focus on posture/step length.      Neuro Re-ed    Neuro Re-ed Details  Seated PWR Up x10 reps with cues for technique and tucking feet back under him/scooting towards edge for improved stability when performing in sitting (as pt initially reports he feels like he is leaning backwards). Standing at countertop; lateral reaching to grab bean bag and then stepping and placing bean bag on table with big hands, performed x8 reps each side. Cued for big movement patterns with reaching and when stepping. Performed lateral stepping strategy x6 reps alternating each side over yard stick  for incr foot clearance, pt challened by this                       PT Short Term Goals - 04/01/21 0937       PT SHORT TERM GOAL #1   Title ALL STGS = LTGs               PT Long Term Goals - 04/29/21 1327       PT LONG TERM GOAL #1   Title Pt will perform progression of  HEP with wife's cues/supervision, for improved strength, balance, transfers, and gait. ALL LTGS DUE 04/29/21    Baseline reviewed entirety of HEP today with pt and pt's spouse for incr carryover for home,    Time 4    Period Weeks    Status On-going    Target Date 04/29/21      PT LONG TERM GOAL #3   Title Pt will decr cog TUG score to 25 seconds or less in order to demo decr fall risk/improved dual tasking.    Baseline 36.81 seconds with SPC, 22.88 seconds on 02/24/21; 30.13 seconds on 03/31/21    Time 4    Period Weeks    Status On-going      PT LONG TERM GOAL #4   Title Pt will improve gait velocity to at least 2.3 ft/sec for improved gait efficiency and safety with SPC vs. RW    Baseline 2.11 ft/sec; 18.13 seconds with SPC = 1.81 ft/sec on 02/24/21    Time 4    Period Weeks    Status On-going      PT LONG TERM GOAL #5   Title Pt will verbalize understanding of local Parkinson's disease resources.    Baseline OT gave handout, briefly reviewed with pt today as he had not had the chance to go over it with his spouse    Time 4    Period Weeks    Status Achieved      PT LONG TERM GOAL #6   Title Pt will recover posterior and anterior balance in push and release test in 2 or less steps independently, for improved balance recovery    Baseline anterior and posterior = 4 small, shuffled steps, needing min A in anterior direction, anterior and posterior = 3 smaller steps on 03/31/21    Time 4    Period Weeks    Status On-going            Ongoing  LTGs:     PT Long Term Goals - 04/29/21 1636       PT LONG TERM GOAL #1   Title Pt will perform progression of  HEP with wife's  cues/supervision, for improved strength, balance, transfers, and gait. ALL LTGS DUE 05/27/21    Baseline reviewed entirety of HEP today with pt and pt's spouse for incr carryover for home,    Time 8    Period Weeks    Status On-going    Target Date 05/27/21      PT LONG TERM GOAL #3   Title Pt will decr cog TUG score to 25 seconds or less in order to demo decr fall risk/improved dual tasking.    Baseline 36.81 seconds with SPC, 22.88 seconds on 02/24/21; 30.13 seconds on 03/31/21    Time 8    Period Weeks    Status On-going      PT LONG TERM GOAL #4   Title Pt will improve gait velocity to at least 1.9 ft/sec for improved gait efficiency and safety with SPC vs. RW    Baseline 2.11 ft/sec; 18.13 seconds with SPC = 1.81 ft/sec on 02/24/21; 1.43 seconds with RW on 04/29/21    Time 8    Period Weeks    Status Revised      PT LONG TERM GOAL #5   Title Pt will verbalize understanding of local Parkinson's disease resources.    Baseline OT gave handout, briefly reviewed with pt today as he had not had the chance to go over it with his spouse    Time 8    Period Weeks    Status Achieved      PT LONG TERM GOAL #6   Title Pt will recover posterior and anterior balance in push and release test in 2 or less steps independently, for improved balance recovery    Baseline anterior and posterior = 4 small, shuffled steps, needing min A in anterior direction, anterior and posterior = 3 smaller steps on 03/31/21    Time 8    Period Weeks    Status On-going                  Plan - 04/29/21 1633     Clinical Impression Statement LTGs due today due to a delay in scheduling since his last visit. Pt has achieved LTG #5 in regards to resources (provided by OT, but pt still needs to look over them). Pt's gait speed with RW today was 1.43 ft/sec and slower today than when last assessed at 1.93 ft/sec. Today's skilled session focused on gait training with incr step length/foot clearance and adding in  dual tasking, postural exercises, and stepping strategies with focus on larger movement patterns. Pt tolerated session well, all LTGs ongoing.    Personal Factors and Comorbidities Comorbidity 3+;Time since onset of injury/illness/exacerbation;Past/Current Experience    Comorbidities PD, inflammatory polymyositis, Raynaud's, CTR LUE, R wrist surgery, back pain; L hip fracture 2/22 from fall, hx of L elbow fx due to fall 2/22 (surgery x2)    Examination-Activity Limitations Stand;Stairs;Transfers;Squat;Locomotion Level    Examination-Participation Restrictions Medication Management;Community Activity;Yard Work    [employer] Evolving/Moderate complexity    Rehab Potential Good    PT Frequency 2x / week    PT Duration 8 weeks    PT Treatment/Interventions ADLs/Self Care Home Management;DME Instruction;Gait training;Therapeutic activities;Stair training;Therapeutic exercise;Balance training;Contrast Bath;Neuromuscular re-education;Patient/family education;Functional mobility training;Vestibular;Energy conservation    PT Next Visit Plan check remainder of LTGs,  D/C? continue working on stepping strategies for balance. cog dual tasking    PT Home Exercise Plan seated PWR (no PWR! Twist due to hx of hip/pelvis fracture), [id number],sit to stands.    Consulted and Agree with Plan of Care Patient             Patient will benefit from skilled therapeutic intervention in order to improve the following deficits and impairments:  Abnormal gait, Decreased activity tolerance, Decreased balance, Decreased coordination, Decreased endurance, Decreased mobility, Decreased strength, Difficulty walking, Impaired flexibility, Postural dysfunction, Pain  Visit Diagnosis: Other symptoms and signs involving the nervous system  Abnormal posture  Unsteadiness on feet  Other abnormalities of gait and mobility     Problem List Patient Active Problem List   Diagnosis Date Noted   Gait  abnormality 08/29/2019   Midline low back pain without sciatica 08/29/2019   Mild cognitive impairment 08/29/2019   Excessive daytime sleepiness 01/24/2019   Nocturia more than twice per night 01/24/2019   Hypersomnia with sleep apnea 01/24/2019   At risk for central sleep apnea 01/24/2019   RLS (restless legs syndrome) 01/24/2019   Idiopathic Parkinson's disease ([place]) 05/07/2015   Polymyositis ([place]) 05/07/2015    Arliss Journey, PT, DPT 04/29/2021, 4:36 PM  [facility] [address] [place], [place], [address] Phone: [phone]   Fax:  [phone]  Name: Alexander Thomas MRN: [id number] Date of Birth: 05/29/1945

## 2021-04-29 NOTE — Therapy (Signed)
[facility] [address] [place], [place], [address] Phone: [phone]   Fax:  [phone]  Occupational Therapy Treatment  Patient Details  Name: Alexander Thomas MRN: [id number] Date of Birth: 1946/03/30 Referring Provider (OT): Dr. Marcial Pacas   Encounter Date: 04/29/2021   OT End of Session - 04/29/21 1242     Visit Number 11    Number of Visits 25   renewal completed 03/31/21   Date for OT Re-Evaluation 05/30/21    Authorization Type Medicare & BCBS--Covered 100%.  cert. period 12/31/20-03/31/21    Authorization - Visit Number 11   renewal/progress note complete on visit 9   Authorization - Number of Visits 19    Progress Note Due on Visit 106    OT Start Time 1234    OT Stop Time 1315    OT Time Calculation (min) 41 min    Activity Tolerance Patient tolerated treatment well    Behavior During Therapy WFL for tasks assessed/performed             Past Medical History:  Diagnosis Date   Hypercholesteremia    Migraine    Polymyositis (HCC)    Raynaud disease    Tremor of right hand     Past Surgical History:  Procedure Laterality Date   CARPAL TUNNEL RELEASE Left    INGUINAL HERNIA REPAIR Bilateral    WRIST SURGERY Right     There were no vitals filed for this visit.   Subjective Assessment - 04/29/21 1236     Subjective  No falls, "able to move better than I was."    Pertinent History Parkinson's Disease.  PMH:  hx of polymyositis, hx of orthostatic tachycardia, memory, Raynaud's disease, L CTR, R wrist fx surgery, L hip fracture 2/22 from fall, hx of L elbow fx due to fall 2/22 (surgery x2), sleep apnea, restless leg syndrome, hx of back pain, mild cognitive impairment.    Limitations fall risk    Patient Stated Goals move better    Currently in Pain? Yes    Pain Score 2     Pain Location Hip    Pain Orientation Left    Pain Descriptors / Indicators Aching    Pain Type Chronic pain    Pain  Onset More than a month ago    Pain Frequency Constant    Aggravating Factors  nothing    Pain Relieving Factors nothing             Supine, PWR! Up with min cueing. and facilitation for incr scapular retraction.  Sitting, functional reaching in ER with each UE with trunk rotation/wt. Shift to flip cards with min-mod cueing for supination, finger ext and trunk rotation as well as bradykinesia to the R.  Sitting, functional reaching in abduction and wt. Shift and mid-range to place/remove clothespins with 1-4lb resistance with min cueing for PWR! Hands with grasp.       OT Education - 04/29/21 1247     Education Details Closed-chain shoulder flex and chest press--see pt instructions    Person(s) Educated Patient    Methods Explanation;Demonstration;Verbal cues;Handout    Comprehension Verbalized understanding;Returned demonstration;Verbal cues required              OT Short Term Goals - 03/31/21 1223       OT SHORT TERM GOAL #1   Title Pt will be independent with PD-specific HEP-- check STGs 01/29/21    Time 4  Period Weeks    Status On-going   02/17/21:  needs cueing.  03/31/21:  would benefit from update     OT SHORT TERM GOAL #2   Title Pt will verbalize understanding of ways to decr risk of complications related to PD.    Time 4    Period Weeks    Status Achieved   02/17/21:  educated, but would benefit from review.  03/31/21 met     OT SHORT TERM GOAL #3   Title Pt will improve ability to use utensils for eating and shown by improving time on PPT#2 by at least 4sec.    Baseline 24sec    Time 4    Period Weeks    Status On-going   03/03/21:  23.90sec     OT SHORT TERM GOAL #4   Title Pt will improve functional reaching/coordination for ADLs/IADLs as shown by improving score on box and blocks test by at least 3 bilaterally.    Baseline R-29 blocks, L-27 blocks    Time 4    Period Weeks    Status Achieved   03/03/21:  R-32blocks, L-34blocks     OT  SHORT TERM GOAL #5   Title Pt will verbalize understanding of memory compensation strategies and ways to keep thinking skills sharp.    Time 4    Period Weeks    Status Achieved   02/17/21:  not yet addressed.  03/03/21-met     OT SHORT TERM GOAL #6   Title Pt will be able to write at least 4 sentences with good legibility and only min decr in size.    Time 4    Period Weeks    Status Achieved               OT Long Term Goals - 04/07/21 1037       OT LONG TERM GOAL #1   Title Pt will verbalize understanding of adaptive strategies/AE to incr ease/safety with ADLs/IADLs.--check LTGs 03/31/21    Time 8    Period Weeks    Status On-going      OT LONG TERM GOAL #2   Title Pt will demo at least 110* shoulder flex with at least -50* elbow ext for improved overhead reaching with each UE.    Baseline R shoulder flex 100* with -55* elbow ext, L shoulder flex 105* with -60* elbow ext    Time 8    Period Weeks    Status Achieved   04/07/21:  R 120* with -40* elbow ext, L 120* with -40* elbow ext     OT LONG TERM GOAL #3   Title Pt will verbalize understanding of appropriate community resources/continuing fitness opportunities related to PD.    Time 8    Period Weeks    Status Achieved   03/31/21:  not yet addressed.  04/07/21 met     OT LONG TERM GOAL #4   Title Pt will be able to fastening/unfastening buttons in 57sec or less.    Baseline 67sec    Time 8    Period Weeks    Status On-going   03/31/21:  not met     OT LONG TERM GOAL #5   Title Pt will be able to don/doff jacket mod I in 51min or less.    Baseline unable/needs assist    Time 8    Period Weeks    Status Achieved   03/31/21:  not met, but no assist needed (revised goal).  04/07/21:  64mn 19sec     OT LONG TERM GOAL #6   Title Pt will improve bilateral hand coordination for ADLs as shown by improving time on 9-hole peg test by at least 5sec bilaterally.    Baseline R-42.10sec, L-48.19sec    Time 8    Period  Weeks    Status On-going                   Plan - 04/29/21 1243     Clinical Impression Statement Pt continues to demo slow progress with functional reach and posture, particularly after exercise.    OT Occupational Profile and History Detailed Assessment- Review of Records and additional review of physical, cognitive, psychosocial history related to current functional performance    Occupational performance deficits (Please refer to evaluation for details): ADL's;IADL's;Work;Leisure;Social Participation    Body Structure / Function / Physical Skills ADL;Decreased knowledge of use of DME;Balance;Dexterity;Tone;UE functional use;IADL;ROM;Mobility;Coordination;Flexibility;Decreased knowledge of precautions;FMC;Improper spinal/pelvic alignment   bradykinesia   Cognitive Skills Attention;Memory    Rehab Potential Good    Clinical Decision Making Several treatment options, min-mod task modification necessary    Comorbidities Affecting Occupational Performance: May have comorbidities impacting occupational performance    Modification or Assistance to Complete Evaluation  Min-Moderate modification of tasks or assist with assess necessary to complete eval    OT Frequency 2x / week    OT Duration 12 weeks   +eval, may d/c after 8wks depending on progress   OT Treatment/Interventions Self-care/ADL training;DME and/or AE instruction;Balance training;Therapeutic activities;Aquatic Therapy;Therapeutic exercise;Cognitive remediation/compensation;Neuromuscular education;Functional Mobility Training;Passive range of motion;Patient/family education;Manual Therapy;Energy conservation;Moist Heat    Plan functional reaching with large amplitude movements, continue with strategies for ADLs/IADLs (continue unmet STGs and LTGs), review updated supine HEP    Consulted and Agree with Plan of Care Patient             Patient will benefit from skilled therapeutic intervention in order to improve the  following deficits and impairments:   Body Structure / Function / Physical Skills: ADL, Decreased knowledge of use of DME, Balance, Dexterity, Tone, UE functional use, IADL, ROM, Mobility, Coordination, Flexibility, Decreased knowledge of precautions, FMC, Improper spinal/pelvic alignment (bradykinesia) Cognitive Skills: Attention, Memory     Visit Diagnosis: Other symptoms and signs involving the nervous system  Abnormal posture  Unsteadiness on feet  Other symptoms and signs involving the musculoskeletal system  Stiffness of right shoulder, not elsewhere classified  Stiffness of left shoulder, not elsewhere classified  Other lack of coordination  Stiffness of right elbow, not elsewhere classified  Stiffness of left elbow, not elsewhere classified  Tremor    Problem List Patient Active Problem List   Diagnosis Date Noted   Gait abnormality 08/29/2019   Midline low back pain without sciatica 08/29/2019   Mild cognitive impairment 08/29/2019   Excessive daytime sleepiness 01/24/2019   Nocturia more than twice per night 01/24/2019   Hypersomnia with sleep apnea 01/24/2019   At risk for central sleep apnea 01/24/2019   RLS (restless legs syndrome) 01/24/2019   Idiopathic Parkinson's disease ([place] 05/07/2015   Polymyositis ([place] 05/07/2015    Shaquille Janes, OT 04/29/2021, 3:03 PM  [place]STonyvilleGPu[place] [address]Phone: [phone]  Fax:  [phone] Name: RABDULMALIK DARCOMRN: [id number] of Birth: [birth year] AVianne Bulls OTR/L CB[facility] SP[place]villeGInman [place]  [phone] [phone]/10/23 3:03 PM

## 2021-04-29 NOTE — Patient Instructions (Signed)
Upper Extremity: [employer]  down.  Hold a ball/shoe box/pillow (shoulder width sized)  with arms straight and hands flat on either side. Start with ball on chest, then straighten elbows to push ball up to the ceiling trying to keep elbows next to body hold 3sec, then slowly lower by keeping elbows close to your side. Keep shoulders away from ears Repeat 10 times.  Rest, then do 10 more, 2x/day   Overhead Arm Extension - Supine     Lay down.  Hold a ball/shoe box/pillow (shoulder width sized) on tops of legs with arms straight and hands flat on either side. Slowly move arms up/back in an arc with elbows straight and then slowly lower back down. Keep shoulders away from ears. Repeat 10 times.    Rest, then do 10 more, 2x/day

## 2021-05-05 ENCOUNTER — Ambulatory Visit: Payer: Medicare Other

## 2021-05-05 ENCOUNTER — Other Ambulatory Visit: Payer: Self-pay

## 2021-05-05 ENCOUNTER — Encounter: Payer: Self-pay | Admitting: Physical Therapy

## 2021-05-05 ENCOUNTER — Ambulatory Visit: Payer: Medicare Other | Admitting: Physical Therapy

## 2021-05-05 DIAGNOSIS — R471 Dysarthria and anarthria: Secondary | ICD-10-CM

## 2021-05-05 DIAGNOSIS — R293 Abnormal posture: Secondary | ICD-10-CM

## 2021-05-05 DIAGNOSIS — R29818 Other symptoms and signs involving the nervous system: Secondary | ICD-10-CM | POA: Diagnosis not present

## 2021-05-05 DIAGNOSIS — R41841 Cognitive communication deficit: Secondary | ICD-10-CM

## 2021-05-05 DIAGNOSIS — R2689 Other abnormalities of gait and mobility: Secondary | ICD-10-CM | POA: Diagnosis not present

## 2021-05-05 DIAGNOSIS — R2681 Unsteadiness on feet: Secondary | ICD-10-CM | POA: Diagnosis not present

## 2021-05-05 DIAGNOSIS — R29898 Other symptoms and signs involving the musculoskeletal system: Secondary | ICD-10-CM | POA: Diagnosis not present

## 2021-05-05 NOTE — Therapy (Addendum)
[facility] [address] [place], [place], [address] Phone: [phone]   Fax:  [phone]  Physical Therapy Treatment  Patient Details  Name: Alexander Thomas MRN: [id number] Date of Birth: April 25, 1945 Referring Provider (PT): Dr. Krista Blue   Encounter Date: 05/05/2021   PT End of Session - 05/05/21 1319     Visit Number 12    Number of Visits 17    Date for PT Re-Evaluation 88/89/16   per re-cert on 94/50/38   Authorization Type Medicare- part A & B    Progress Note Due on Visit 19   performed 10th visit PN at visit 9   PT Start Time 1317    PT Stop Time 1357    PT Time Calculation (min) 40 min    Equipment Utilized During Treatment Gait belt    Activity Tolerance Patient tolerated treatment well    Behavior During Therapy WFL for tasks assessed/performed             Past Medical History:  Diagnosis Date   Hypercholesteremia    Migraine    Polymyositis (HCC)    Raynaud disease    Tremor of right hand     Past Surgical History:  Procedure Laterality Date   CARPAL TUNNEL RELEASE Left    INGUINAL HERNIA REPAIR Bilateral    WRIST SURGERY Right     There were no vitals filed for this visit.   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    Pertinent History PD, inflammatory polymyositis, Raynaud's, CTR LUE, R wrist surgery, back pain;  L hip pain    Limitations Walking;House hold activities    Patient Stated Goals wants to know how much he might have slipped from last bout of therapy, wants to work on his problem areas    Currently in Pain? Yes    Pain Score 3     Pain Location Hip    Pain Orientation Left    Pain Descriptors / Indicators Aching    Pain Type Chronic pain    Pain Onset More than a month ago    Aggravating Factors  Nothing    Pain Relieving Factors Nothing                               [facility] Adult PT  Treatment/Exercise - 05/05/21 1321       Transfers   Transfers Sit to Stand;Stand to Sit    Sit to Stand 5: Supervision    Sit to Stand Details Verbal cues for technique;Tactile cues for weight beaing    Stand to Sit 5: Supervision    Comments During session cued for incr forward weight shift with nose over toes      Ambulation/Gait   Ambulation/Gait Yes    Ambulation/Gait Assistance 5: Supervision    Ambulation/Gait Assistance Details Clinic distances with cane with cued for posture and step length    Assistive device Straight cane    Gait Pattern Step-through pattern;Decreased step length - right;Decreased step length - left;Decreased trunk rotation;Trunk flexed    Ambulation Surface Level;Indoor      Neuro Re-ed    Neuro Re-ed Details  Modified quadruped PWR Up at countertop x10 reps - verbal/demo cues for technique and incr intensity of movement, Standing PWR step at countertop -verbal/demo cues for technique; stepping over yardstick for incr foot clearance  and intensity of movement, seated PWR Up > sit to stand x7 reps      Knee/Hip Exercises: Aerobic   Stepper SciFit Stepper with BLE/BUE at gear 1.6 for 8 minutes for ROM, activity tolerance, reciprocal movement. Cued for big movement patterns throughout. Pt reports that he has a local gym that his wife is apart of that he could possibly join as well. Pt initially reports effort level as a 5-6/10, incr to 7/10 after cues. Pt keeping spm above 70                 Balance Exercises - 05/05/21 1346       Balance Exercises: Standing   Retro Gait 4 reps;Limitations    Retro Gait Limitations Down and back at countertop, pt initially performing in smaller, shuffled steps, cues for larger steps and pt able to perform consistently with larger steps    Sidestepping Upper extremity support;4 reps;Limitations    Sidestepping Limitations Down and back 2 reps, peforming first in 7 steps, cued for bigger steps and performing in 6 with  remainder of reps, cues for incr foot clearance                  PT Short Term Goals - 04/01/21 0937       PT SHORT TERM GOAL #1   Title ALL STGS = LTGs               PT Long Term Goals - 04/29/21 1636       PT LONG TERM GOAL #1   Title Pt will perform progression of  HEP with wife's cues/supervision, for improved strength, balance, transfers, and gait. ALL LTGS DUE 05/27/21    Baseline reviewed entirety of HEP today with pt and pt's spouse for incr carryover for home,    Time 8    Period Weeks    Status On-going    Target Date 05/27/21      PT LONG TERM GOAL #3   Title Pt will decr cog TUG score to 25 seconds or less in order to demo decr fall risk/improved dual tasking.    Baseline 36.81 seconds with SPC, 22.88 seconds on 02/24/21; 30.13 seconds on 03/31/21    Time 8    Period Weeks    Status On-going      PT LONG TERM GOAL #4   Title Pt will improve gait velocity to at least 1.9 ft/sec for improved gait efficiency and safety with SPC vs. RW    Baseline 2.11 ft/sec; 18.13 seconds with SPC = 1.81 ft/sec on 02/24/21; 1.43 seconds with RW on 04/29/21    Time 8    Period Weeks    Status Revised      PT LONG TERM GOAL #5   Title Pt will verbalize understanding of local Parkinson's disease resources.    Baseline OT gave handout, briefly reviewed with pt today as he had not had the chance to go over it with his spouse    Time 8    Period Weeks    Status Achieved      PT LONG TERM GOAL #6   Title Pt will recover posterior and anterior balance in push and release test in 2 or less steps independently, for improved balance recovery    Baseline anterior and posterior = 4 small, shuffled steps, needing min A in anterior direction, anterior and posterior = 3 smaller steps on 03/31/21    Time 8    Period Weeks  Status On-going                   Plan - 05/05/21 1639     Clinical Impression Statement Started the session on the SciFit for reciprocal movement  patterns, aerobic activity, and ROM. Pt able to report effort level as a 7/10 when performing. Reports he could have access to this if he joins the gym that his wife is apart of. Remainder of session focused on standing balance strategies with stepping, foot clearance, and incr step length. Pt did well with use of visual cue of yardstick for incr foot clearance with lateral stepping. Will continue to progress towards LTGs.    Personal Factors and Comorbidities Comorbidity 3+;Time since onset of injury/illness/exacerbation;Past/Current Experience    Comorbidities PD, inflammatory polymyositis, Raynaud's, CTR LUE, R wrist surgery, back pain; L hip fracture 2/22 from fall, hx of L elbow fx due to fall 2/22 (surgery x2)    Examination-Activity Limitations Stand;Stairs;Transfers;Squat;Locomotion Level    Examination-Participation Restrictions Medication Management;Community Activity;Yard Work    [employer] Evolving/Moderate complexity    Rehab Potential Good    PT Frequency 2x / week    PT Duration 8 weeks    PT Treatment/Interventions ADLs/Self Care Home Management;DME Instruction;Gait training;Therapeutic activities;Stair training;Therapeutic exercise;Balance training;Contrast Bath;Neuromuscular re-education;Patient/family education;Functional mobility training;Vestibular;Energy conservation    PT Next Visit Plan check remainder of LTGs, D/C? continue working on stepping strategies for balance. foot clearance activities, postural exercises. cog dual tasking    PT Home Exercise Plan seated PWR (no PWR! Twist due to hx of hip/pelvis fracture), [id number],sit to stands.    Consulted and Agree with Plan of Care Patient             Patient will benefit from skilled therapeutic intervention in order to improve the following deficits and impairments:  Abnormal gait, Decreased activity tolerance, Decreased balance, Decreased coordination, Decreased endurance, Decreased mobility,  Decreased strength, Difficulty walking, Impaired flexibility, Postural dysfunction, Pain  Visit Diagnosis: Abnormal posture  Unsteadiness on feet  Other symptoms and signs involving the nervous system     Problem List Patient Active Problem List   Diagnosis Date Noted   Gait abnormality 08/29/2019   Midline low back pain without sciatica 08/29/2019   Mild cognitive impairment 08/29/2019   Excessive daytime sleepiness 01/24/2019   Nocturia more than twice per night 01/24/2019   Hypersomnia with sleep apnea 01/24/2019   At risk for central sleep apnea 01/24/2019   RLS (restless legs syndrome) 01/24/2019   Idiopathic Parkinson's disease ([place]) 05/07/2015   Polymyositis ([place]) 05/07/2015    Arliss Journey, PT, DPT  05/05/2021, 4:41 PM  [facility] [address] [place], [place], [address] Phone: [phone]   Fax:  [phone]  Name: Alexander Thomas MRN: [id number] Date of Birth: 05-25-1945

## 2021-05-05 NOTE — Therapy (Signed)
Alexander Thomas [address] [place], [place], [address] Phone: [phone]   Fax:  [phone]  Speech Language Pathology Treatment/Recert  Patient Details  Name: Alexander Thomas MRN: [id number] Date of Birth: 18-Jul-1945 Referring Provider (SLP): Marcial Pacas, MD   Encounter Date: 05/05/2021   End of Session - 05/05/21 1345     Visit Number 11    Number of Visits 25    Date for SLP Re-Evaluation 06/06/21   Extended as pt returned to therapy after 1 month   Authorization Type Medicare    SLP Start Time 1400    SLP Stop Time  2800    SLP Time Calculation (min) 42 min    Activity Tolerance Patient tolerated treatment well             Past Medical History:  Diagnosis Date   Hypercholesteremia    Migraine    Polymyositis ([place])    Raynaud disease    Tremor of right hand     Past Surgical History:  Procedure Laterality Date   CARPAL TUNNEL RELEASE Left    INGUINAL HERNIA REPAIR Bilateral    WRIST SURGERY Right     There were no vitals filed for this visit.   Subjective Assessment - 05/05/21 1401     Subjective "It's better"    Currently in Pain? Yes    Pain Score 3     Pain Location Hip                   ADULT SLP TREATMENT - 05/05/21 1345       General Information   Behavior/Cognition Alert;Cooperative;Pleasant mood      Treatment Provided   Treatment provided Cognitive-Linquistic      Cognitive-Linquistic Treatment   Treatment focused on Dysarthria;Patient/family/caregiver education    Skilled Treatment Pt returned for ST therapy after nearly 1 month. Pt indicated speech is "better" and has continued to practice /a/. SLP inquired about wife's feedback, in which pt stated she requests repetition due to her hearing loss, not his speech. Intermittent throat clears exhibited, in which SLP continued to cue throat clear alternatives (gentle cough, hum and swallow, hard swallow, sip of water).  Loud /a/ completed x5 with average of upper 80s dB. SLP targeted abdominal breathing at sentence level, with usual verbal and visual cues to utilize abdominal breath. WNL volume achieved. SLP introduced PHOrTE exercises today, which did improve some of the previously persistent hoarseness.      Assessment / Recommendations / Plan   Plan Continue with current plan of care;Goals updated      Progression Toward Goals   Progression toward goals Progressing toward goals              SLP Education - 05/05/21 1443     Education Details PHOrTE exercises    Person(s) Educated Patient    Methods Explanation;Demonstration;Handout;Verbal cues    Comprehension Verbalized understanding;Returned demonstration;Verbal cues required;Need further instruction              SLP Short Term Goals - 02/17/21 1403       SLP SHORT TERM GOAL #1   Title Pt will complete dysarthria HEP given occasional min A over 2 sessions    Time 4    Period Weeks    Status Partially Met    Target Date 02/17/21   extended as pt started tx 2 weeks after eval     SLP SHORT TERM GOAL #2   Title  Pt will produce loud /a/ or "hey!" with at least low 90s dB average over three sessions    Baseline 02-03-21    Time 4    Period Weeks    Status Partially Met    Target Date 02/17/21      SLP SHORT TERM GOAL #3   Title Pt will demo abdominal breathing at rest and during sentence responses with 80% accuracy given occasional min A over 2 sessions    Baseline 02-17-21    Time 4    Period Weeks    Status Partially Met    Target Date 02/17/21      SLP SHORT TERM GOAL #4   Title Pt will maintain average of 70 DB in Q and A using compensations given occasional min A across 3 sessions    Time 4    Period Weeks    Status Not Met    Target Date 02/17/21      SLP SHORT TERM GOAL #5   Title Pt will demonstrate dysarthria compensations in 15 minute mod complex conversation with occasional min A over 2 sessions    Time 4     Period Weeks    Status Not Met    Target Date 02/17/21              SLP Long Term Goals - 05/05/21 1346       SLP LONG TERM GOAL #1   Title Pt will complete dysarthria HEP given rare min A over 2 sessions    Status Partially Met      SLP LONG TERM GOAL #2   Title Pt will maintain average of 70 DB in 30 conversation with rare min A across 3 sessions    Baseline 04-07-21    Time 4    Period Weeks    Status On-going   LTGs ongoing for recert   Target Date 00/45/99      SLP LONG TERM GOAL #3   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 Ongoing education and training completed for implementation of dysarthria compensations and HEP. SLP reviewed throat clear alternatives and abdominal breathing to maximize breath support to reduce hoarseness. PHOrTE introduced today, which did intermittently improve hoarseness. Pt returned after nearly 1 month away from therapy, with recert extended 4 weeks due to lapse in therapy. 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    Speech Therapy Frequency 2x / week    Duration 4 weeks   recert   Treatment/Interventions Compensatory strategies;Functional tasks;Patient/family education;Compensatory techniques;Internal/external aids;SLP  instruction and feedback    Potential to Achieve Goals Good  SLP Home Exercise Plan provided    Consulted and Agree with Plan of Care Patient             Patient will benefit from skilled therapeutic intervention in order to improve the following deficits and impairments:   Dysarthria and anarthria  Cognitive communication deficit    Problem List Patient Active Problem List   Diagnosis Date Noted   Gait abnormality 08/29/2019   Midline low back pain without sciatica 08/29/2019   Mild cognitive impairment 08/29/2019   Excessive daytime sleepiness 01/24/2019   Nocturia more than twice per night 01/24/2019   Hypersomnia with sleep apnea 01/24/2019   At risk for central sleep apnea 01/24/2019   RLS (restless legs syndrome) 01/24/2019   Idiopathic Parkinson's disease ([place]) 05/07/2015   Polymyositis ([place]) 05/07/2015    Alinda Deem, [place] 05/05/2021, 2:45 PM  [facility] [address] [place], [place], [address] Phone: [phone]   Fax:  [phone]   Name: IZACC DEMEYER MRN: [id number] Date of Birth: [birth year]

## 2021-05-05 NOTE — Patient Instructions (Signed)
Practice raising/lowering your pitch   Low to high "ooooo" x5 High to low "ooooo" x5  We need more power in your voice. Practice x10 sentences in a high pitch "calling over the fence" voice Practice x10 sentences in low pitch "authoritative" voice

## 2021-05-13 ENCOUNTER — Other Ambulatory Visit: Payer: Self-pay

## 2021-05-13 ENCOUNTER — Encounter: Payer: Self-pay | Admitting: Occupational Therapy

## 2021-05-13 ENCOUNTER — Ambulatory Visit: Payer: Medicare Other

## 2021-05-13 ENCOUNTER — Ambulatory Visit: Payer: Medicare Other | Admitting: Occupational Therapy

## 2021-05-13 DIAGNOSIS — R29818 Other symptoms and signs involving the nervous system: Secondary | ICD-10-CM | POA: Diagnosis not present

## 2021-05-13 DIAGNOSIS — R251 Tremor, unspecified: Secondary | ICD-10-CM

## 2021-05-13 DIAGNOSIS — R2681 Unsteadiness on feet: Secondary | ICD-10-CM

## 2021-05-13 DIAGNOSIS — R293 Abnormal posture: Secondary | ICD-10-CM

## 2021-05-13 DIAGNOSIS — R471 Dysarthria and anarthria: Secondary | ICD-10-CM | POA: Diagnosis not present

## 2021-05-13 DIAGNOSIS — M25611 Stiffness of right shoulder, not elsewhere classified: Secondary | ICD-10-CM

## 2021-05-13 DIAGNOSIS — R278 Other lack of coordination: Secondary | ICD-10-CM

## 2021-05-13 DIAGNOSIS — M25612 Stiffness of left shoulder, not elsewhere classified: Secondary | ICD-10-CM

## 2021-05-13 DIAGNOSIS — R29898 Other symptoms and signs involving the musculoskeletal system: Secondary | ICD-10-CM | POA: Diagnosis not present

## 2021-05-13 DIAGNOSIS — M25621 Stiffness of right elbow, not elsewhere classified: Secondary | ICD-10-CM

## 2021-05-13 DIAGNOSIS — M25622 Stiffness of left elbow, not elsewhere classified: Secondary | ICD-10-CM

## 2021-05-13 DIAGNOSIS — R2689 Other abnormalities of gait and mobility: Secondary | ICD-10-CM | POA: Diagnosis not present

## 2021-05-13 NOTE — Therapy (Signed)
[facility] [address] [place], [place], [address] Phone: [phone]   Fax:  [phone]  Occupational Therapy Treatment  Patient Details  Name: Alexander Thomas MRN: [id number] Date of Birth: [birth year] Referring Provider (OT): Dr. Marcial Pacas   Encounter Date: 05/13/2021   OT End of Session - 05/13/21 0922     Visit Number 12    Number of Visits 25   renewal completed 03/31/21   Date for OT Re-Evaluation 05/30/21    Authorization Type Medicare & BCBS--Covered 100%.  cert. period 12/31/20-03/31/21    Authorization - Visit Number 12   renewal/progress note complete on visit 9   Authorization - Number of Visits 19    Progress Note Due on Visit 70    OT Start Time 0932    OT Stop Time 1014    OT Time Calculation (min) 42 min    Activity Tolerance Patient tolerated treatment well    Behavior During Therapy WFL for tasks assessed/performed             Past Medical History:  Diagnosis Date   Hypercholesteremia    Migraine    Polymyositis ([facility])    Raynaud disease    Tremor of right hand     Past Surgical History:  Procedure Laterality Date   CARPAL TUNNEL RELEASE Left    INGUINAL HERNIA REPAIR Bilateral    WRIST SURGERY Right     There were no vitals filed for this visit.   Subjective Assessment - 05/13/21 0921     Subjective  Pt reports that he fell last week taking out bag of trash, only scraped R ankle and elbow.  No pain from fall.  Also ran late this morning (missed ST appt).  Pt reports using strategies for eating helps.   Pt reports that he put on jacket himself today.    Pertinent History Parkinson's Disease.  PMH:  hx of polymyositis, hx of orthostatic tachycardia, memory, Raynaud's disease, L CTR, R wrist fx surgery, L hip fracture 2/22 from fall, hx of L elbow fx due to fall 2/22 (surgery x2), sleep apnea, restless leg syndrome, hx of back pain, mild cognitive impairment.    Limitations fall  risk    Patient Stated Goals move better    Currently in Pain? Yes    Pain Score 3     Pain Location Hip    Pain Orientation Left    Pain Descriptors / Indicators Aching    Pain Type Chronic pain    Pain Onset More than a month ago    Pain Frequency Constant    Aggravating Factors  nothing    Pain Relieving Factors nothing             In supine, PWR! Up, closed-chain shoulder flex and chest press with foam noodle with scapular retraction x10 each with min cueing for incr movement amplitude.    Began checking goals and discussing progress--see goals section below as well as follow-up therapy recommendations after d/c.  PWR! Hands (up) x10 with min v.c. for incr movement amplitude.  Practiced adaptive strategies for fastening/unfastening buttons and pt returned demo after initial cueing.        OT Education - 05/13/21 1602     Education Details Set-up and Use of PD Exercise Flowsheet.  Recommendation/Rational for follow up eval recommendation in approx 6 months.    Person(s) Educated Patient    Methods Explanation;Handout;Demonstration    Comprehension Verbalized understanding  OT Short Term Goals - 05/13/21 0950       OT SHORT TERM GOAL #1   Title Pt will be independent with PD-specific HEP-- check STGs 01/29/21    Time 4    Period Weeks    Status Achieved   02/17/21:  needs cueing.  03/31/21:  would benefit from update.  05/13/21:  met, but does not perform as consistent     OT SHORT TERM GOAL #2   Title Pt will verbalize understanding of ways to decr risk of complications related to PD.    Time 4    Period Weeks    Status Achieved   02/17/21:  educated, but would benefit from review.  03/31/21 met     OT SHORT TERM GOAL #3   Title Pt will improve ability to use utensils for eating and shown by improving time on PPT#2 by at least 4sec.    Baseline 24sec    Time 4    Period Weeks    Status On-going   03/03/21:  23.90sec     OT SHORT TERM  GOAL #4   Title Pt will improve functional reaching/coordination for ADLs/IADLs as shown by improving score on box and blocks test by at least 3 bilaterally.    Baseline R-29 blocks, L-27 blocks    Time 4    Period Weeks    Status Achieved   03/03/21:  R-32blocks, L-34blocks     OT SHORT TERM GOAL #5   Title Pt will verbalize understanding of memory compensation strategies and ways to keep thinking skills sharp.    Time 4    Period Weeks    Status Achieved   02/17/21:  not yet addressed.  03/03/21-met     OT SHORT TERM GOAL #6   Title Pt will be able to write at least 4 sentences with good legibility and only min decr in size.    Time 4    Period Weeks    Status Achieved               OT Long Term Goals - 05/13/21 0954       OT LONG TERM GOAL #1   Title Pt will verbalize understanding of adaptive strategies/AE to incr ease/safety with ADLs/IADLs.--check LTGs 03/31/21    Time 8    Period Weeks    Status On-going      OT LONG TERM GOAL #2   Title Pt will demo at least 110* shoulder flex with at least -50* elbow ext for improved overhead reaching with each UE.    Baseline R shoulder flex 100* with -55* elbow ext, L shoulder flex 105* with -60* elbow ext    Time 8    Period Weeks    Status Achieved   04/07/21:  R 120* with -40* elbow ext, L 120* with -40* elbow ext     OT LONG TERM GOAL #3   Title Pt will verbalize understanding of appropriate community resources/continuing fitness opportunities related to PD.    Time 8    Period Weeks    Status Achieved   03/31/21:  not yet addressed.  04/07/21 met     OT LONG TERM GOAL #4   Title Pt will be able to fastening/unfastening buttons in 57sec or less.    Baseline 67sec    Time 8    Period Weeks    Status Achieved   03/31/21:  not met.  05/13/21:  45.88sec     OT LONG TERM  GOAL #5   Title Pt will be able to don/doff jacket mod I in 41mn or less.    Baseline unable/needs assist    Time 8    Period Weeks    Status  Achieved   03/31/21:  not met, but no assist needed (revised goal).  04/07/21:  370m 19sec     OT LONG TERM GOAL #6   Title Pt will improve bilateral hand coordination for ADLs as shown by improving time on 9-hole peg test by at least 5sec bilaterally.    Baseline R-42.10sec, L-48.19sec    Time 8    Period Weeks    Status Achieved   05/13/21:  R-34.31, L-34.76sec                  Plan - 05/13/21 0922     Clinical Impression Statement Pt continues to demo progress coordination and ADL performance.    OT Occupational Profile and History Detailed Assessment- Review of Records and additional review of physical, cognitive, psychosocial history related to current functional performance    Occupational performance deficits (Please refer to evaluation for details): ADL's;IADL's;Work;Leisure;Social Participation    Body Structure / Function / Physical Skills ADL;Decreased knowledge of use of DME;Balance;Dexterity;Tone;UE functional use;IADL;ROM;Mobility;Coordination;Flexibility;Decreased knowledge of precautions;FMC;Improper spinal/pelvic alignment   bradykinesia   Cognitive Skills Attention;Memory    Rehab Potential Good    Clinical Decision Making Several treatment options, min-mod task modification necessary    Comorbidities Affecting Occupational Performance: May have comorbidities impacting occupational performance    Modification or Assistance to Complete Evaluation  Min-Moderate modification of tasks or assist with assess necessary to complete eval    OT Frequency 2x / week    OT Duration 12 weeks   +eval, may d/c after 8wks depending on progress   OT Treatment/Interventions Self-care/ADL training;DME and/or AE instruction;Balance training;Therapeutic activities;Aquatic Therapy;Therapeutic exercise;Cognitive remediation/compensation;Neuromuscular education;Functional Mobility Training;Passive range of motion;Patient/family education;Manual Therapy;Energy conservation;Moist Heat    Plan  check remaining goals, anticipate d/c; schedule follow-up evaluations in approx 6-8 months    Consulted and Agree with Plan of Care Patient             Patient will benefit from skilled therapeutic intervention in order to improve the following deficits and impairments:   Body Structure / Function / Physical Skills: ADL, Decreased knowledge of use of DME, Balance, Dexterity, Tone, UE functional use, IADL, ROM, Mobility, Coordination, Flexibility, Decreased knowledge of precautions, FMC, Improper spinal/pelvic alignment (bradykinesia) Cognitive Skills: Attention, Memory     Visit Diagnosis: Other symptoms and signs involving the nervous system  Other symptoms and signs involving the musculoskeletal system  Abnormal posture  Stiffness of right shoulder, not elsewhere classified  Stiffness of left shoulder, not elsewhere classified  Other lack of coordination  Stiffness of right elbow, not elsewhere classified  Stiffness of left elbow, not elsewhere classified  Tremor  Other abnormalities of gait and mobility  Unsteadiness on feet    Problem List Patient Active Problem List   Diagnosis Date Noted   Gait abnormality 08/29/2019   Midline low back pain without sciatica 08/29/2019   Mild cognitive impairment 08/29/2019   Excessive daytime sleepiness 01/24/2019   Nocturia more than twice per night 01/24/2019   Hypersomnia with sleep apnea 01/24/2019   At risk for central sleep apnea 01/24/2019   RLS (restless legs syndrome) 01/24/2019   [place]iopathic [place]'s disease [place]17[place]   [place]    Creedence Kunesh, OT 05/13/2021, 4:07 PM  [facility]  [address] [place], [place], [address] Phone: [phone]   Fax:  [phone]  Name: SALADIN PETRELLI MRN: [id number] Date of Birth: Nov 19, 1945  [name], OTR/L [facility] [address] [place], [place]  [address] [phone] phone [phone] 05/13/21 4:07 PM

## 2021-05-13 NOTE — Patient Instructions (Signed)
°(  Exercise) Monday Tuesday Wednesday Thursday Friday Saturday Sunday   Coordination            PWR! Lying down           Lying down with ball           Seated [place]           PWR! sitting           Stepping at [employer]

## 2021-05-19 ENCOUNTER — Encounter: Payer: Self-pay | Admitting: Physical Therapy

## 2021-05-19 ENCOUNTER — Ambulatory Visit: Payer: Medicare Other | Admitting: Occupational Therapy

## 2021-05-19 ENCOUNTER — Encounter: Payer: Self-pay | Admitting: Occupational Therapy

## 2021-05-19 ENCOUNTER — Other Ambulatory Visit: Payer: Self-pay

## 2021-05-19 ENCOUNTER — Ambulatory Visit: Payer: Medicare Other | Admitting: Physical Therapy

## 2021-05-19 ENCOUNTER — Ambulatory Visit: Payer: Medicare Other

## 2021-05-19 DIAGNOSIS — R29818 Other symptoms and signs involving the nervous system: Secondary | ICD-10-CM | POA: Diagnosis not present

## 2021-05-19 DIAGNOSIS — R29898 Other symptoms and signs involving the musculoskeletal system: Secondary | ICD-10-CM | POA: Diagnosis not present

## 2021-05-19 DIAGNOSIS — R2681 Unsteadiness on feet: Secondary | ICD-10-CM

## 2021-05-19 DIAGNOSIS — R278 Other lack of coordination: Secondary | ICD-10-CM

## 2021-05-19 DIAGNOSIS — R251 Tremor, unspecified: Secondary | ICD-10-CM

## 2021-05-19 DIAGNOSIS — M25611 Stiffness of right shoulder, not elsewhere classified: Secondary | ICD-10-CM

## 2021-05-19 DIAGNOSIS — R471 Dysarthria and anarthria: Secondary | ICD-10-CM

## 2021-05-19 DIAGNOSIS — M25622 Stiffness of left elbow, not elsewhere classified: Secondary | ICD-10-CM

## 2021-05-19 DIAGNOSIS — R293 Abnormal posture: Secondary | ICD-10-CM

## 2021-05-19 DIAGNOSIS — R2689 Other abnormalities of gait and mobility: Secondary | ICD-10-CM

## 2021-05-19 DIAGNOSIS — M25621 Stiffness of right elbow, not elsewhere classified: Secondary | ICD-10-CM

## 2021-05-19 DIAGNOSIS — M25612 Stiffness of left shoulder, not elsewhere classified: Secondary | ICD-10-CM

## 2021-05-19 NOTE — Patient Instructions (Addendum)
Access Code: [email or web address] URL: [email or web address] Date: 05/19/2021 Prepared by: Janann August  Exercises Lateral Weight Shift - 1 x daily - 5 x weekly - 2 sets - 10 reps Alternating Step Forward with Support - 2 x daily - 5 x weekly - 1-2 sets - 10 reps Alternating Step Backward with Support - 1-2 x daily - 5 x weekly - 1-2 sets - 10 reps Sit to stand transfers   Seated PWR Up, [place], [employer]

## 2021-05-19 NOTE — Therapy (Addendum)
[facility] [address] [place], [place], [address] Phone: [phone]   Fax:  [phone]  Speech Language Pathology Treatment/Discharge   Patient Details  Name: Alexander Thomas MRN: [id number] Date of Birth: 02/26/1946 Referring Provider (SLP): Marcial Pacas, MD   Encounter Date: 05/19/2021   End of Session - 05/19/21 1141     Visit Number 12    Number of Visits 25    Date for SLP Re-Evaluation 06/06/21    Authorization Type Medicare    SLP Start Time 1150    SLP Stop Time  1228    SLP Time Calculation (min) 38 min    Activity Tolerance Patient tolerated treatment well             Past Medical History:  Diagnosis Date   Hypercholesteremia    Migraine    Polymyositis ([facility])    Raynaud disease    Tremor of right hand     Past Surgical History:  Procedure Laterality Date   CARPAL TUNNEL RELEASE Left    INGUINAL HERNIA REPAIR Bilateral    WRIST SURGERY Right     There were no vitals filed for this visit.   Subjective Assessment - 05/19/21 1142     Subjective "It's doing much better"    Currently in Pain? 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 Intermittent hoarseness and throat clearing exhibited, in which SLP has educated and instructed abdominal breathing and throat clear alternatives to reduced hoarseness. Occasional to usual cues required to carryover these strategies Pt is pleased with current progress and requested ST discharge this date.    Remaining deficits: PD   Education / Equipment: Dysarthria compensations,  recommended HEP, caregiver education   Patient agrees to discharge. Patient goals were partially met. Patient is being discharged due to being pleased with the current functional level..     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 Pt has not completed PHORTE or SOVTE exercises as recommended in ST sessions. Inconsistent carryover of HEP and attendance to therapy exhibited; however, some progress and carryover exhibited. Loud /a/ averaged upper upper 80s dB today. Pt maintained WNL volume with rare cues to optimize effort and energy. SLP educated and rewrote targeted HEP to continue complete after ST discharge. Pt verbalized understanding and agreement.      Assessment / Recommendations / Plan   Plan Discharge SLP treatment due to (comment)   pt pleased with current level     Progression Toward Goals   Progression toward goals Goals partially met, education completed, patient discharged from [facility] - 05/19/21 1141     Education Details discharge summary,  dysathria compensations, HEP    Person(s) Educated Patient    Methods Explanation;Demonstration    Comprehension Verbalized understanding;Returned demonstration              SLP Short Term Goals - 05/19/21 1142       SLP SHORT TERM GOAL #1   Title Pt will complete dysarthria HEP given occasional min A over 2 sessions    Status Partially Met      SLP SHORT TERM GOAL #2   Title Pt will produce loud /a/ or "hey!" with at least low 90s dB average over three sessions    Baseline 02-03-21    Status  Partially Met      SLP SHORT TERM GOAL #3   Title Pt will demo abdominal breathing at rest and during sentence responses with 80% accuracy given occasional min A over 2 sessions    Baseline 02-17-21    Status Partially Met      SLP SHORT TERM GOAL #4   Title Pt will maintain average of 70 DB in Q and A using compensations given occasional min A across 3 sessions    Status Not Met      SLP SHORT TERM GOAL #5   Title Pt will demonstrate dysarthria compensations in 15 minute mod complex conversation with occasional min A over 2 sessions    Status Not Met              SLP Long Term Goals - 05/19/21 1159       SLP LONG TERM GOAL #1   Title Pt will complete dysarthria HEP given rare min A over 2 sessions    Status Partially Met      SLP LONG TERM GOAL #2   Title Pt will maintain average of 70 DB in 30 conversation with rare min A across 3 sessions    Baseline 04-07-21, 05-19-21    Status Partially Met      SLP LONG TERM GOAL #3   Title 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 Completed education and training for implementation of dysarthria compensations and HEP. SLP reviewed throat clear alternatives and abdominal breathing to maximize breath support and reduce hoarseness. Pt requested ST discharge on last scheduled visit as pt is pleased with current progress. Pt will set up 6 month evaluations with all therapies. SLP recommended pt request additional script if he exhibits decline or change in dysarthria, cognition, or swallowing prior to 6 month mark.    Speech Therapy  Frequency 2x / week    Duration 4 weeks      Treatment/Interventions Compensatory strategies;Functional tasks;Patient/family education;Compensatory techniques;Internal/external aids;SLP instruction and feedback    Potential to Achieve Goals Good    SLP Home Exercise Plan provided    Consulted and Agree with Plan of Care Patient             Patient will benefit from skilled therapeutic intervention in order to improve the following deficits and impairments:   Dysarthria  and anarthria    Problem List Patient Active Problem List   Diagnosis Date Noted   Gait abnormality 08/29/2019   Midline low back pain without sciatica 08/29/2019   Mild cognitive impairment 08/29/2019   Excessive daytime sleepiness 01/24/2019   Nocturia more than twice per night 01/24/2019   Hypersomnia with sleep apnea 01/24/2019   At risk for central sleep apnea 01/24/2019   RLS (restless legs syndrome) 01/24/2019   Idiopathic Parkinson's disease ([facility]) 05/07/2015   Polymyositis ([facility]) 05/07/2015    Alinda Deem, [place] 05/19/2021, 1:25 PM  [facility] [address] [place], [place], [address] Phone: [phone]   Fax:  [phone]   Name: Alexander Thomas MRN: [id number] Date of Birth: 25-Oct-1945

## 2021-05-19 NOTE — Patient Instructions (Addendum)
Things to work on in the next 6 months:   1) Loud "ahh" x5. Big deep breath, as loud as you can (twice a day is BEST)  2) Practice reading aloud for 5-10 minutes. Breathe at the punctuation to reduce hoarseness and keep the loudness.   3) Keep up the energy and loudness when you're talking in conversation. 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

## 2021-05-19 NOTE — Therapy (Signed)
[facility] [address] [place], [place], [address] Phone: [phone]   Fax:  [phone]  Occupational Therapy Treatment  Patient Details  Name: Alexander Thomas MRN: [id number] Date of Birth: 1945-05-30 Referring Provider (OT): Dr. Marcial Pacas   Encounter Date: 05/19/2021   OT End of Session - 05/19/21 1028     Visit Number 13    Number of Visits 25   renewal completed 03/31/21   Date for OT Re-Evaluation 05/30/21    Authorization Type Medicare & [employer]BS--Covered 100%.  cert. period 12/31/20-03/31/21    Authorization - Visit Number 13   renewal/progress note complete on visit 9   Authorization - Number of Visits 19    Progress Note Due on Visit 18    OT Start Time 1020    OT Stop Time 1100    OT Time Calculation (min) 40 min    Activity Tolerance Patient tolerated treatment well    Behavior During Therapy WFL for tasks assessed/performed             Past Medical History:  Diagnosis Date   Hypercholesteremia    Migraine    Polymyositis ([facility])    Raynaud disease    Tremor of right hand     Past Surgical History:  Procedure Laterality Date   CARPAL TUNNEL RELEASE Left    INGUINAL HERNIA REPAIR Bilateral    WRIST SURGERY Right     There were no vitals filed for this visit.   Subjective Assessment - 05/19/21 1024     Subjective  Pt reports fall going down steps with while carrying trash (not holding onto to rail, using walking stick).  No serious injuries other than hit L elbow (sore).  I can walk with the walker in the house and my knee loosens up.    Pertinent History Parkinson's Disease.  PMH:  hx of polymyositis, hx of orthostatic tachycardia, memory, Raynaud's disease, L CTR, R wrist fx surgery, L hip fracture 2/22 from fall, hx of L elbow fx due to fall 2/22 (surgery x2), sleep apnea, restless leg syndrome, hx of back pain, mild cognitive impairment.    Limitations fall risk    Patient Stated Goals  move better    Currently in Pain? Yes    Pain Score 3     Pain Location Hip   and knee   Pain Orientation Left    Pain Descriptors / Indicators Aching    Pain Type Chronic pain    Pain Onset More than a month ago    Pain Frequency Constant    Aggravating Factors  nothing    Pain Relieving Factors nothing              Standing at counter, performing PWR! Moves (rock and twist) in modified quadruped, standing rock and reach without UE countertop support for wt. Shift and associated trunk movements with min cueing for incr movement amplitude.  Standing at counter, performed functional reaching in ER with side step with each UE/LE for wt. Shift and trunk rotation to flip cards with supination and wrist/finger extension with min cueing for incr movement amplitude.  Checked remaining goals and discussed progress and recommendation for follow-up evaluation in 6 months.   Pt verbalized understanding/agreement.         OT Education - 05/19/21 1424     Education Details Intimacy with PD (per pt questions)--Recommended pt discuss with neurologist as well as referred pt to [employer] on the  topic and Hotline for further info, also generally discussed communication with wife, exploring medication timing/time of day changes, and exploring position changes.    Person(s) Educated Patient    Methods Explanation;Handout    Comprehension Verbalized understanding              OT Short Term Goals - 05/19/21 1049       OT SHORT TERM GOAL #1   Title Pt will be independent with PD-specific HEP-- check STGs 01/29/21    Time 4    Period Weeks    Status Achieved   02/17/21:  needs cueing.  03/31/21:  would benefit from update.  05/13/21:  met, but does not perform as consistent     OT SHORT TERM GOAL #2   Title Pt will verbalize understanding of ways to decr risk of complications related to PD.    Time 4    Period Weeks    Status Achieved   02/17/21:  educated, but would  benefit from review.  03/31/21 met     OT SHORT TERM GOAL #3   Title Pt will improve ability to use utensils for eating and shown by improving time on PPT#2 by at least 4sec.    Baseline 24sec    Time 4    Period Weeks    Status Partially Met   03/03/21:  23.90sec.  05/09/21:  22.40sec, 19.40sec (inconsistent)     OT SHORT TERM GOAL #4   Title Pt will improve functional reaching/coordination for ADLs/IADLs as shown by improving score on box and blocks test by at least 3 bilaterally.    Baseline R-29 blocks, L-27 blocks    Time 4    Period Weeks    Status Achieved   03/03/21:  R-32blocks, L-34blocks     OT SHORT TERM GOAL #5   Title Pt will verbalize understanding of memory compensation strategies and ways to keep thinking skills sharp.    Time 4    Period Weeks    Status Achieved   02/17/21:  not yet addressed.  03/03/21-met     OT SHORT TERM GOAL #6   Title Pt will be able to write at least 4 sentences with good legibility and only min decr in size.    Time 4    Period Weeks    Status Achieved               OT Long Term Goals - 05/19/21 1423       OT LONG TERM GOAL #1   Title Pt will verbalize understanding of adaptive strategies/AE to incr ease/safety with ADLs/IADLs.--check LTGs 03/31/21    Time 8    Period Weeks    Status Achieved      OT LONG TERM GOAL #2   Title Pt will demo at least 110* shoulder flex with at least -50* elbow ext for improved overhead reaching with each UE.    Baseline R shoulder flex 100* with -55* elbow ext, L shoulder flex 105* with -60* elbow ext    Time 8    Period Weeks    Status Achieved   04/07/21:  R 120* with -40* elbow ext, L 120* with -40* elbow ext     OT LONG TERM GOAL #3   Title Pt will verbalize understanding of appropriate community resources/continuing fitness opportunities related to PD.    Time 8    Period Weeks    Status Achieved   03/31/21:  not yet addressed.  04/07/21 met  OT LONG TERM GOAL #4   Title Pt will  be able to fastening/unfastening buttons in 57sec or less.    Baseline 67sec    Time 8    Period Weeks    Status Achieved   03/31/21:  not met.  05/13/21:  45.88sec     OT LONG TERM GOAL #5   Title Pt will be able to don/doff jacket mod I in 69mn or less.    Baseline unable/needs assist    Time 8    Period Weeks    Status Achieved   03/31/21:  not met, but no assist needed (revised goal).  04/07/21:  338m 19sec     OT LONG TERM GOAL #6   Title Pt will improve bilateral hand coordination for ADLs as shown by improving time on 9-hole peg test by at least 5sec bilaterally.    Baseline R-42.10sec, L-48.19sec    Time 8    Period Weeks    Status Achieved   05/13/21:  R-34.31, L-34.76sec                  Plan - 05/19/21 1028     Clinical Impression Statement Pt has made good progress with improved coordination, ROM, and incr ease with ADLs.    OT Occupational Profile and History Detailed Assessment- Review of Records and additional review of physical, cognitive, psychosocial history related to current functional performance    Occupational performance deficits (Please refer to evaluation for details): ADL's;IADL's;Work;Leisure;Social Participation    Body Structure / Function / Physical Skills ADL;Decreased knowledge of use of DME;Balance;Dexterity;Tone;UE functional use;IADL;ROM;Mobility;Coordination;Flexibility;Decreased knowledge of precautions;FMC;Improper spinal/pelvic alignment   bradykinesia   Cognitive Skills Attention;Memory    Rehab Potential Good    Clinical Decision Making Several treatment options, min-mod task modification necessary    Comorbidities Affecting Occupational Performance: May have comorbidities impacting occupational performance    Modification or Assistance to Complete Evaluation  Min-Moderate modification of tasks or assist with assess necessary to complete eval    OT Frequency 2x / week    OT Duration 12 weeks   +eval, may d/c after 8wks depending on  progress   OT Treatment/Interventions Self-care/ADL training;DME and/or AE instruction;Balance training;Therapeutic activities;Aquatic Therapy;Therapeutic exercise;Cognitive remediation/compensation;Neuromuscular education;Functional Mobility Training;Passive range of motion;Patient/family education;Manual Therapy;Energy conservation;Moist Heat    Plan d/c; schedule follow-up evaluations in approx 6 months    Consulted and Agree with Plan of Care Patient             Patient will benefit from skilled therapeutic intervention in order to improve the following deficits and impairments:   Body Structure / Function / Physical Skills: ADL, Decreased knowledge of use of DME, Balance, Dexterity, Tone, UE functional use, IADL, ROM, Mobility, Coordination, Flexibility, Decreased knowledge of precautions, FMC, Improper spinal/pelvic alignment (bradykinesia) Cognitive Skills: Attention, Memory     Visit Diagnosis: Other symptoms and signs involving the nervous system  Other symptoms and signs involving the musculoskeletal system  Abnormal posture  Stiffness of right shoulder, not elsewhere classified  Stiffness of left shoulder, not elsewhere classified  Other lack of coordination  Stiffness of right elbow, not elsewhere classified  Stiffness of left elbow, not elsewhere classified  Tremor  Other abnormalities of gait and mobility  Unsteadiness on feet    Problem List Patient Active Problem List   Diagnosis Date Noted   Gait abnormality 08/29/2019   Midline low back pain without sciatica 08/29/2019   Mild cognitive impairment 08/29/2019   Excessive daytime sleepiness 01/24/2019  Nocturia more than twice per night 01/24/2019   Hypersomnia with sleep apnea 01/24/2019   At risk for central sleep apnea 01/24/2019   RLS (restless legs syndrome) 01/24/2019   Idiopathic Parkinson's disease ([facility]) 05/07/2015   Polymyositis ([facility]) 05/07/2015    OCCUPATIONAL THERAPY DISCHARGE  SUMMARY  Visits from Start of Care: 13  Current functional level related to goals / functional outcomes: See above   Remaining deficits: Bradykinesia, rigidity, decr coordination, abnormal posture, decr balance/functional mobility, Tremors, cognitive deficits   Education / Equipment: Pt was instructed in the following:  PD-specific HEP, adaptive strategies for ADLs/IADLs, ways to prevent future complications, memory compensation strategies, community resources.  Pt verbalized understanding of all education provided.   Plan: Patient agrees to discharge.  Patient goals were met. Patient is being discharged due to being pleased with the current functional level.  Pt would benefit from occupational therapy evaluation in approx 6 months to assess for need for further therapy/functional changes due to progressive nature of diagnosis.    [place], [place] 05/19/2021, 2:31 PM  [facility] [address] [place], [place], [address] Phone: [phone]   Fax:  [phone]  Name: Alexander Thomas MRN: [id number] Date of Birth: 09-Dec-1945  [name], OTR/L [facility] [address] [place], [place]  [address] [phone] phone [phone] 05/19/21 2:31 PM

## 2021-05-19 NOTE — Therapy (Signed)
[facility] [address] [place], [place], [address] Phone: [phone]   Fax:  [phone]  Physical Therapy Treatment/Discharge Summary  Patient Details  Name: Alexander Thomas MRN: [id number] Date of Birth: 21-Jan-1946 Referring Provider (PT): Dr. Krista Blue   Encounter Date: 05/19/2021   PT End of Session - 05/19/21 1117     Visit Number 13    Number of Visits 17    Date for PT Re-Evaluation 51/70/01   per re-cert on 74/94/49   Authorization Type Medicare- part A & B    Progress Note Due on Visit 19   performed 10th visit PN at visit 9   PT Start Time [place] During Treatment Gait belt    Activity Tolerance Patient tolerated treatment well    Behavior During Therapy WFL for tasks assessed/performed             Past Medical History:  Diagnosis Date   Hypercholesteremia    Migraine    Polymyositis (HCC)    Raynaud disease    Tremor of right hand     Past Surgical History:  Procedure Laterality Date   CARPAL TUNNEL RELEASE Left    INGUINAL HERNIA REPAIR Bilateral    WRIST SURGERY Right     There were no vitals filed for this visit.   Subjective Assessment - 05/19/21 1111     Subjective Had a fall - was going down the stairs using his cane and carrying the trash. Got a few scrapes, did not hurt himself. Learned a lot of good strategies from therapy.    Pertinent History PD, inflammatory polymyositis, Raynaud's, CTR LUE, R wrist surgery, back pain;  L hip pain    Limitations Walking;House hold activities    Patient Stated Goals wants to know how much he might have slipped from last bout of therapy, wants to work on his problem areas    Currently in Pain? Yes    Pain Score 3     Pain Location Hip   and knee   Pain Orientation Left    Pain Descriptors / Indicators Aching    Pain Type Chronic pain    Pain Onset More than a month ago    Aggravating Factors  Nothing    Pain Relieving  Factors Nothing                               [facility] Adult PT Treatment/Exercise - 05/19/21 1132       Transfers   Transfers Sit to Stand;Stand to Sit    Sit to Stand 5: Supervision    Sit to Stand Details Verbal cues for technique;Tactile cues for weight beaing    Stand to Sit 5: Supervision    Comments 2 sets of 5 reps with cues for big effort with nose over toes and posture in standing.      Ambulation/Gait   Ambulation/Gait Yes    Ambulation/Gait Assistance 5: Supervision    Ambulation/Gait Assistance Details During session - cued for posture and incr step length.    Ambulation Distance (Feet) --   clinic distances   Assistive device Straight cane    Gait Pattern Step-through pattern;Decreased step length - right;Decreased step length - left;Decreased trunk rotation;Trunk flexed    Ambulation Surface Level;Indoor    Gait velocity with SPC 18.34 seconds = 1.79 ft/sec    Stairs Yes    Stairs Assistance  5: Supervision    Stairs Assistance Details (indicate cue type and reason) Intermittent cues for proper placement of SPC.    Stair Management Technique One rail Right;With cane    Number of Stairs 4   x2 sets     High Level Balance   High Level Balance Comments push and release test, anterior = 2 steps, posterior = 4 small shuffled steps      Therapeutic Activites    Therapeutic Activities Other Therapeutic Activities    Other Therapeutic Activities Provided updated handout for local PD resources and went over credible websites with information as well as info to order Every Victory Counts Manual from [facility]. Reviewed fall prevention in regards to recent fall - having pt's spouse carry trash down stairs as pt needs to fully attend to the task of stairs for safety.                Pt performs PWR! Moves in sitting at edge of mat:   PWR! UP x 10 reps, cues for smooth forward lean and scapular retraction to upright posture   PWR! Rock  x 10 reps each side, reaching with arms across body, cues for open hands and elbow extension.    PWR! Step (modified for seated marching in place), x10 reps with cues for STOMPING to incr intensity of movement.    Access Code: [email or web address] URL: [email or web address] Date: 05/19/2021 Prepared by: Janann August  Reviewed exercises for HEP and reviewed proper technique for performing and relation to function.   Exercises Lateral Weight Shift - 1 x daily - 5 x weekly - 2 sets - 10 reps Alternating Step Forward with Support - 2 x daily - 5 x weekly - 1-2 sets - 10 reps Alternating Step Backward with Support - 1-2 x daily - 5 x weekly - 1-2 sets - 10 reps     PT Short Term Goals - 04/01/21 0937       PT SHORT TERM GOAL #1   Title ALL STGS = LTGs               PT Long Term Goals - 05/19/21 1219       PT LONG TERM GOAL #1   Title Pt will perform progression of  HEP with wife's cues/supervision, for improved strength, balance, transfers, and gait. ALL LTGS DUE 05/27/21    Baseline reviewed entirety of HEP today - pt reports consistently performing his walking program, but inconsistent performance of remainder of exercises.    Time 8    Period Weeks    Status Partially Met    Target Date 05/27/21      PT LONG TERM GOAL #3   Title Pt will decr cog TUG score to 25 seconds or less in order to demo decr fall risk/improved dual tasking.    Baseline 36.81 seconds with SPC, 22.88 seconds on 02/24/21; 30.13 seconds on 03/31/21. did not have time to perform on 05/19/21    Time 8    Period Weeks    Status Deferred      PT LONG TERM GOAL #4   Title Pt will improve gait velocity to at least 1.9 ft/sec for improved gait efficiency and safety with SPC vs. RW    Baseline 18.13 seconds with SPC = 1.81 ft/sec on 02/24/21;  with SPC 18.34 seconds = 1.79 ft/sec on 05/19/21    Time 8    Period Weeks    Status Not Met  PT LONG TERM GOAL #5   Title Pt will verbalize understanding  of local Parkinson's disease resources.    Baseline reviewed on 05/19/21    Time 8    Period Weeks    Status Achieved      PT LONG TERM GOAL #6   Title Pt will recover posterior and anterior balance in push and release test in 2 or less steps independently, for improved balance recovery    Baseline anterior = 2 steps, posterior =4 smaller steps on 05/19/21    Time 8    Period Weeks    Status Partially Met              PHYSICAL THERAPY DISCHARGE SUMMARY  Visits from Start of Care: 13  Current functional level related to goals / functional outcomes: See LTGs/clinical impression statement.   Remaining deficits: Bradykinesia, gait abnormalities, impaired timing/coordination of gait, postural abnormalities, impaired balance, decr functional strength   Education / Equipment: HEP, local PD resources.  Pt to be scheduled for PD evals in 6 months.    Patient agrees to discharge. Patient goals were partially met. Patient is being discharged due to being pleased with the current functional level.      Plan - 05/19/21 1252     Clinical Impression Statement Today's skilled session focused on assessing pt's LTGs for D/C. Pt partially met LTG #1 and #6. Pt improved anterior push and release to 2 steps for improved balance recovery, but needs to take 4 small shuffled steps to regain balance in the posterior direction. Pt intermittently performs some of his exercises with PD specific HEP, but is performing walking program with RW consistently. Pt did not meet LTG #4 in regards to gait speed. Pt able to maintain consistent gait speed with SPC since last assessed. Was 1.79 ft/sec today (previously was 1.81 ft/sec). Reviewed entirety of pt's HEP today with pt able to verbalize and demo understanding. Pt is pleased with the progress he has made in PT.    Personal Factors and Comorbidities Comorbidity 3+;Time since onset of injury/illness/exacerbation;Past/Current Experience    Comorbidities PD,  inflammatory polymyositis, Raynaud's, CTR LUE, R wrist surgery, back pain; L hip fracture 2/22 from fall, hx of L elbow fx due to fall 2/22 (surgery x2)    Examination-Activity Limitations Stand;Stairs;Transfers;Squat;Locomotion Level    Examination-Participation Restrictions Medication Management;Community Activity;Yard Work    [employer]chant [employer] officer Evolving/Moderate complexity    Rehab Potential Good    PT Frequency 2x / week    PT Duration 8 weeks    PT Treatment/Interventions ADLs/Self Care Home Management;DME Instruction;Gait training;Therapeutic activities;Stair training;Therapeutic exercise;Balance training;Contrast Bath;Neuromuscular re-education;Patient/family education;Functional mobility training;Vestibular;Energy conservation    PT Next Visit Plan D/C    PT Home Exercise Plan seated PWR (no PWR! Twist due to hx of hip/pelvis fracture), [email or web address],sit to stands.    Consulted and Agree with Plan of Care Patient             Patient will benefit from skilled therapeutic intervention in order to improve the following deficits and impairments:  Abnormal gait, Decreased activity tolerance, Decreased balance, Decreased coordination, Decreased endurance, Decreased mobility, Decreased strength, Difficulty walking, Impaired flexibility, Postural dysfunction, Pain  Visit Diagnosis: Other symptoms and signs involving the nervous system  Abnormal posture  Unsteadiness on feet     Problem List Patient Active Problem List   Diagnosis Date Noted   Gait abnormality 08/29/2019   Midline low back pain without sciatica 08/29/2019   Mild cognitive impairment 08/29/2019  Excessive daytime sleepiness 01/24/2019   Nocturia more than twice per night 01/24/2019   Hypersomnia with sleep apnea 01/24/2019   At risk for central sleep apnea 01/24/2019   RLS (restless legs syndrome) 01/24/2019   Idiopathic Parkinson's disease ([place]) 05/07/2015   Polymyositis ([place]) 05/07/2015     Arliss Journey, PT, DPT  05/19/2021, 12:57 PM  [facility] [address] [place], [place], [address] Phone: [phone]   Fax:  [phone]  Name: FRIEDRICH HARRIOTT MRN: [id number] Date of Birth: 10/27/1945

## 2021-06-03 DIAGNOSIS — M84454A Pathological fracture, pelvis, initial encounter for fracture: Secondary | ICD-10-CM | POA: Diagnosis not present

## 2021-06-03 DIAGNOSIS — S32512A Fracture of superior rim of left pubis, initial encounter for closed fracture: Secondary | ICD-10-CM | POA: Diagnosis not present

## 2021-06-03 DIAGNOSIS — M25562 Pain in left knee: Secondary | ICD-10-CM | POA: Diagnosis not present

## 2021-06-03 DIAGNOSIS — M1712 Unilateral primary osteoarthritis, left knee: Secondary | ICD-10-CM | POA: Diagnosis not present

## 2021-06-03 DIAGNOSIS — M1612 Unilateral primary osteoarthritis, left hip: Secondary | ICD-10-CM | POA: Diagnosis not present

## 2021-06-03 DIAGNOSIS — S32402A Unspecified fracture of left acetabulum, initial encounter for closed fracture: Secondary | ICD-10-CM | POA: Diagnosis not present

## 2021-06-09 NOTE — Progress Notes (Signed)
PATIENT: Alexander Thomas DOB: 03-21-1946  REASON FOR VISIT: Follow up for Parkinson's Disease  HISTORY FROM: Patient PRIMARY NEUROLOGIST: Dr. Krista Blue  HISTORY  BARON PARMELEE is a 76 years old right-handed male, accompanied by his wife, seen in refer by his primary care physician Dr. Jani Gravel for evaluation of right hand tremor, Initial evaluation was in August 2016   I have reviewed and summarized his most recent office note   He had a past medical history of hyperlipidemia, Raynaud's disease,, had left carpal tunnel release surgery, right wrist fracture surgery, polymyositis,  left deltoid muscle biopsy consistent with fiber atrophy, perivasculitis in August 2002, at its worst, he had difficulty standing up, need his wife help him dressing, he has taken methotrexate along with low-dose prednisone for many years, in 2015, he was switched to Imuran 50 mg twice a day, most recent CPK level was 83, he denies significant muscle weakness now   I also reviewed laboratory results in July 2016, normal CMP, CBC, CPK 83, mild elevated LDL 134, normal ESR 6, hepatitis panel was negative   He is a retired Company secretary, since 2014, he noticed mild right hand tremor, getting worse gradually, there was no significant gait difficulty, mild lack of facial expression, he has lack of smell, which has been a chronic issues, he denies REM sleep disorder, mild constipations.   Update February 04 2015: He was diagnosed with mild Parkinson's disease, was started on Azilect since August 2016, does not notice any symptomatic improvement either,   MRI of the brain without contrast in August 2016, mild supratentorium small vessel disease, no acute lesions.   UPDATE May 07 2015: He was started on requip xr titrating dose since October 2016, He still has mild right hand shaking, left hand is less, he is now taking Azilect 1 mg every day, requip xr 2 mg 2 tablets every night at 8pm.  He did not noticed any significant  side effect.  It does help his tremor   He denies muscle weakness or muscle pain, he had a history of inflammatory polymyositis   UPDATE July 17th 2017: He is taking Azilect 82m qday, he tolerated requip xL 482m2 tabs po qhs well, he still has tremor, he sleeps well, he lost sense of smell, he has no significant gait abnormality, he has ok appetite, he exercise regularly,  5-10 mintues sit ups at that time, he denies obsessive compulsive behavior.   Update August 10 2016: Parkinson's Disease,  He is now taking ropinirole XL 4 mg 4 tablets every night, Azilect 1 mg every morning, there was no significant fluctuation noticed, he ambulate without much difficulty, does has intermittent right hand resting tremor,   Inflammatory polymyositis Imuran 50 mg 2 tablets every day He is doing well, still has significant right hand tremor,    UPDATE November 16 2017: He feels fine, continue to pastor a smaller church, taking Requip xl 84m184mtabs qhs, azilect 1mg27mily, imuran 50mg47mabs every day, he denies significant muscle weakness, stated he can walk better, but has mild increased drooling, he denies significant memory loss,   UPDATE Feb 6th 2020: He is accompanied by his wife at today's clinical visit, missing his medication frequently, slow worsening memory loss, Today's [place] examination is 23 out of 30 no significant muscle weakness, He still passed her small church of elderly attendants   UPDATE Aug 29 2019: He has constant right hand tremor, 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, was noted  by his wife has increased gait abnormality, mild memory loss, MoCA examination is 21/30 today   He also complains of significant low back pain, stay at midline, unbearable sometimes, especially after prolonged standing or walking   Sleep study in October 2020 showed no significant obstructive sleep apnea, he has frequent nocturia, wake up 3-4 times each night, usually able to go back to sleep without much difficulty    Update March 26, 2020 Wife concerned about his weight loss, 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   Update July 30, 2020 He fell broke his left elbow require surgery on June 02, 2020, also suffered a left pelvic fracture, now just began to weightbearing, he stayed at nursing home since then, he tried to limit his water intake worried about going to the bathroom   He was noted to have orthostatic dizziness, hypotension, today he did not have significant blood pressure drop getting up from seated position, but there was significant orthostatic tachycardia   Blood pressure sitting down 122/75/80; standing up 115/64, heart rate of 96; standing up for 1 minute 122/72, heart rate of 96; standing up to 3 minutes 128/77, heart rate of 103, is mildly symptomatic,   UPDATE December 02 2020: He is accompanied by his wife at today's visit, overall stable, he spent most time sitting down, still gives sermon once a week, chronic taking Sinemet 25/100 mg 1 tablet in the morning, because mostly sedentary, he does not complains of orthostatic dizziness getting home, today sitting down blood pressure 120/70, standing up 110/70, he did not complains of orthostatic symptoms, but apparently he has increased gait abnormality, was also undermedicated with Sinemet  Update June 10, 2021 SS: Still pastor of church, 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 Just finished PT/ST/OT, was very helpful, going back in summer. Feels like freezing, gets stuck with walking, think his way out of it, can utilize his therapy tactics. Resting tremor left hand. Uses cane, walking. Only takes Sinemet once daily. In ER last week for left hip and left knee pain. Eats well, sleeping good. Mild drooling    REVIEW OF SYSTEMS: Out of a complete 14 system review of symptoms, the patient complains only of the following symptoms, and all other reviewed systems are negative.  See HPI  ALLERGIES: No Known Allergies  HOME MEDICATIONS: Outpatient Medications Prior to Visit  Medication Sig Dispense Refill   COD LIVER OIL PO Take by mouth.     doxycycline (VIBRAMYCIN) 100 MG capsule as needed.   3   gabapentin (NEURONTIN) 300 MG capsule as needed.      magnesium hydroxide (MILK OF MAGNESIA) 400 MG/5ML suspension Take 5 mLs by mouth as needed for mild constipation.     terbinafine (LAMISIL) 250 MG tablet Take 250 mg by mouth daily.     azaTHIOprine (IMURAN) 50 MG tablet Take 1 tablet (50 mg total) by mouth in the morning and at bedtime. 180 tablet 4   carbidopa-levodopa (SINEMET IR) 25-100 MG tablet Take 1 tablet by mouth 3 (three) times daily. 7am, 11am, 15pm, 19pm. 270 tablet 4   rasagiline (AZILECT) 1 MG TABS tablet Take 1 tablet (1 mg total) by mouth daily. 90 tablet 3   No facility-administered medications prior to visit.    PAST  MEDICAL HISTORY: Past Medical History:  Diagnosis Date   Hypercholesteremia    Migraine    Polymyositis (HCC)    Raynaud disease    Tremor of right hand     PAST SURGICAL HISTORY: Past Surgical History:  Procedure Laterality Date   CARPAL TUNNEL RELEASE Left    INGUINAL HERNIA REPAIR Bilateral    WRIST SURGERY Right     FAMILY HISTORY: Family History  Problem Relation Age of Onset   Uterine cancer Mother    Healthy Father    Kidney cancer Brother    Colon cancer Brother     SOCIAL HISTORY: Social History   Socioeconomic History   Marital status: Married    Spouse name: Dollie   Number of children: 2   Years of education: PhD   Highest education level: Not on file  Occupational History   Occupation: Company secretary  Tobacco Use   Smoking status: Never   Smokeless tobacco: Never  Substance and Sexual Activity   Alcohol use: No     Alcohol/week: 0.0 standard drinks   Drug use: No   Sexual activity: Not on file  Other Topics Concern   Not on file  Social History Narrative   Lives at home with his wife.   Right-handed.   1 cup caffeine daily.   Social Determinants of Health   Financial Resource Strain: Not on file  Food Insecurity: Not on file  Transportation Needs: Not on file  Physical Activity: Not on file  Stress: Not on file  Social Connections: Not on file  Intimate Partner Violence: Not on file   PHYSICAL EXAM  Vitals:   06/10/21 1126  BP: 102/66  Pulse: 82  Weight: 159 lb (72.1 kg)  Height: 5' 9"  (1.753 m)   Body mass index is 23.48 kg/m. Generalized: Well developed, in no acute distress  Neurological examination  Mentation: Alert oriented to time, place, history taking. Follows all commands speech and language fluent, soft voice  Cranial nerve II-XII: Pupils were equal round reactive to light. Extraocular movements were full, visual field were full on confrontational test. Facial sensation and strength were normal. Head turning and shoulder shrug  were normal and symmetric. Motor: Good strength all extremities, resting tremor left upper extremity, left more than right rigidity, bradykinesia Sensory: Sensory testing is intact to soft touch on all 4 extremities. No evidence of extinction is noted.  Coordination: Cerebellar testing reveals good finger-nose-finger and heel-to-shin bilaterally.  Gait and station: Has to push off from seated position to stand, very small shuffling gait, forward leaning, freezing with walking, camptocormia   DIAGNOSTIC DATA (LABS, IMAGING, TESTING) - I reviewed patient records, labs, notes, testing and imaging myself where available.  Lab Results  Component Value Date   WBC 5.8 03/26/2020   HGB 14.6 03/26/2020   HCT 44.0 03/26/2020   MCV 92 03/26/2020      Component Value Date/Time   NA 138 03/26/2020 1420   K 4.7 03/26/2020 1420   CL 98 03/26/2020 1420    CO2 30 (H) 03/26/2020 1420   GLUCOSE 87 03/26/2020 1420   BUN 14 03/26/2020 1420   CREATININE 0.83 03/26/2020 1420   CALCIUM 9.4 03/26/2020 1420   PROT 7.2 03/26/2020 1420   ALBUMIN 4.2 03/26/2020 1420   AST 13 03/26/2020 1420   ALT 8 03/26/2020 1420   ALKPHOS 76 03/26/2020 1420   BILITOT 0.2 03/26/2020 1420   GFRNONAA 87 03/26/2020 1420   GFRAA 100 03/26/2020 1420   No results  found for: CHOL, HDL, LDLCALC, LDLDIRECT, TRIG, CHOLHDL No results found for: HGBA1C No results found for: VITAMINB12 Lab Results  Component Value Date   TSH 2.030 03/26/2020      ASSESSMENT AND PLAN 76 y.o. year old male   1.  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  History of polymyositis -Stable, Continue Imuran 50 mg twice daily  Butler Denmark, [place], DNP 06/10/2021, 12:39 PM [facility] [address], [place], [place] [address] [phone]

## 2021-06-10 ENCOUNTER — Encounter: Payer: Self-pay | Admitting: Neurology

## 2021-06-10 ENCOUNTER — Other Ambulatory Visit: Payer: Self-pay | Admitting: Neurology

## 2021-06-10 ENCOUNTER — Ambulatory Visit (INDEPENDENT_AMBULATORY_CARE_PROVIDER_SITE_OTHER): Payer: Medicare Other | Admitting: Neurology

## 2021-06-10 VITALS — BP 102/66 | HR 82 | Ht 69.0 in | Wt 159.0 lb

## 2021-06-10 DIAGNOSIS — M3320 Polymyositis, organ involvement unspecified: Secondary | ICD-10-CM | POA: Diagnosis not present

## 2021-06-10 DIAGNOSIS — G20 Parkinson's disease: Secondary | ICD-10-CM | POA: Diagnosis not present

## 2021-06-10 DIAGNOSIS — I7300 Raynaud's syndrome without gangrene: Secondary | ICD-10-CM | POA: Insufficient documentation

## 2021-06-10 DIAGNOSIS — L719 Rosacea, unspecified: Secondary | ICD-10-CM | POA: Insufficient documentation

## 2021-06-10 DIAGNOSIS — R972 Elevated prostate specific antigen [PSA]: Secondary | ICD-10-CM | POA: Insufficient documentation

## 2021-06-10 DIAGNOSIS — M810 Age-related osteoporosis without current pathological fracture: Secondary | ICD-10-CM | POA: Insufficient documentation

## 2021-06-10 DIAGNOSIS — L709 Acne, unspecified: Secondary | ICD-10-CM | POA: Insufficient documentation

## 2021-06-10 DIAGNOSIS — B0229 Other postherpetic nervous system involvement: Secondary | ICD-10-CM | POA: Insufficient documentation

## 2021-06-10 DIAGNOSIS — M1990 Unspecified osteoarthritis, unspecified site: Secondary | ICD-10-CM | POA: Insufficient documentation

## 2021-06-10 MED ORDER — CARBIDOPA-LEVODOPA 25-100 MG PO TABS: 1.0000 | ORAL_TABLET | Freq: Three times a day (TID) | ORAL | 4 refills | Status: DC

## 2021-06-10 MED ORDER — RASAGILINE MESYLATE 1 MG PO TABS: 1.0000 mg | ORAL_TABLET | Freq: Every day | ORAL | 3 refills | Status: DC

## 2021-06-10 MED ORDER — AZATHIOPRINE 50 MG PO TABS: 50.0000 mg | ORAL_TABLET | Freq: Two times a day (BID) | ORAL | 4 refills | Status: DC

## 2021-06-10 NOTE — Patient Instructions (Signed)
Please try to increase Sinemet to 1 tablet 3 times daily to help with movement Continue other medications Make sure drinking plenty of water  Be very careful not to fall  See you back in 5 months

## 2021-06-11 NOTE — Progress Notes (Signed)
°  Please do a MOCA exam at next visit, may consider physical therapy too.

## 2021-07-08 DIAGNOSIS — M3322 Polymyositis with myopathy: Secondary | ICD-10-CM | POA: Diagnosis not present

## 2021-07-08 DIAGNOSIS — M7989 Other specified soft tissue disorders: Secondary | ICD-10-CM | POA: Diagnosis not present

## 2021-07-08 DIAGNOSIS — M810 Age-related osteoporosis without current pathological fracture: Secondary | ICD-10-CM | POA: Diagnosis not present

## 2021-07-08 DIAGNOSIS — L739 Follicular disorder, unspecified: Secondary | ICD-10-CM | POA: Diagnosis not present

## 2021-07-08 DIAGNOSIS — Z79899 Other long term (current) drug therapy: Secondary | ICD-10-CM | POA: Diagnosis not present

## 2021-07-08 DIAGNOSIS — I7300 Raynaud's syndrome without gangrene: Secondary | ICD-10-CM | POA: Diagnosis not present

## 2021-07-08 DIAGNOSIS — G20 Parkinson's disease: Secondary | ICD-10-CM | POA: Diagnosis not present

## 2021-07-08 DIAGNOSIS — I878 Other specified disorders of veins: Secondary | ICD-10-CM | POA: Diagnosis not present

## 2021-07-08 DIAGNOSIS — M1990 Unspecified osteoarthritis, unspecified site: Secondary | ICD-10-CM | POA: Diagnosis not present

## 2021-07-21 DIAGNOSIS — L821 Other seborrheic keratosis: Secondary | ICD-10-CM | POA: Diagnosis not present

## 2021-07-21 DIAGNOSIS — L578 Other skin changes due to chronic exposure to nonionizing radiation: Secondary | ICD-10-CM | POA: Diagnosis not present

## 2021-07-21 DIAGNOSIS — L570 Actinic keratosis: Secondary | ICD-10-CM | POA: Diagnosis not present

## 2021-07-22 DIAGNOSIS — M810 Age-related osteoporosis without current pathological fracture: Secondary | ICD-10-CM | POA: Diagnosis not present

## 2021-09-16 DIAGNOSIS — H6123 Impacted cerumen, bilateral: Secondary | ICD-10-CM | POA: Diagnosis not present

## 2021-09-25 ENCOUNTER — Telehealth: Payer: Self-pay

## 2021-09-25 NOTE — Telephone Encounter (Signed)
Dr. Krista Blue,  Alexander Thomas is scheduled for OT, PT, and ST re-evaluations on 10-27-21 as recommended when last treated in January 2023. Pt was last discharged from therapy due to progressive nature of diagnosis.  Pt was in agreement with this plan. If you are in agreement, please send updated orders for OT, PT, and ST via Epic or fax the order to [phone].  Thank you,  Thank you, Leta Speller, [facility] [address] [place], [place]  [address] Phone:  [phone] Fax:  [phone]

## 2021-09-30 DIAGNOSIS — G20 Parkinson's disease: Secondary | ICD-10-CM | POA: Diagnosis not present

## 2021-09-30 DIAGNOSIS — M1990 Unspecified osteoarthritis, unspecified site: Secondary | ICD-10-CM | POA: Diagnosis not present

## 2021-09-30 DIAGNOSIS — I878 Other specified disorders of veins: Secondary | ICD-10-CM | POA: Diagnosis not present

## 2021-09-30 DIAGNOSIS — M810 Age-related osteoporosis without current pathological fracture: Secondary | ICD-10-CM | POA: Diagnosis not present

## 2021-09-30 DIAGNOSIS — I7300 Raynaud's syndrome without gangrene: Secondary | ICD-10-CM | POA: Diagnosis not present

## 2021-09-30 DIAGNOSIS — Z79899 Other long term (current) drug therapy: Secondary | ICD-10-CM | POA: Diagnosis not present

## 2021-09-30 DIAGNOSIS — M7989 Other specified soft tissue disorders: Secondary | ICD-10-CM | POA: Diagnosis not present

## 2021-09-30 DIAGNOSIS — M3322 Polymyositis with myopathy: Secondary | ICD-10-CM | POA: Diagnosis not present

## 2021-10-27 ENCOUNTER — Ambulatory Visit: Payer: Medicare Other | Attending: Family Medicine | Admitting: Physical Therapy

## 2021-10-27 ENCOUNTER — Ambulatory Visit: Payer: Medicare Other | Admitting: Occupational Therapy

## 2021-10-27 ENCOUNTER — Other Ambulatory Visit: Payer: Self-pay | Admitting: *Deleted

## 2021-10-27 ENCOUNTER — Ambulatory Visit: Payer: Medicare Other

## 2021-10-27 DIAGNOSIS — G20 Parkinson's disease: Secondary | ICD-10-CM

## 2021-10-27 NOTE — Therapy (Deleted)
OUTPATIENT SPEECH LANGUAGE PATHOLOGY PARKINSON'S EVALUATION   Patient Name: Alexander Thomas MRN: [id number] DOB:[birth year], 76 y.o., male Today's Date: 10/27/2021  PCP: Janie Morning, DO REFERRING PROVIDER: Marcial Pacas, MD     Past Medical History:  Diagnosis Date   Hypercholesteremia    Migraine    Polymyositis ([place])    Raynaud disease    Tremor of right hand    Past Surgical History:  Procedure Laterality Date   CARPAL TUNNEL RELEASE Left    INGUINAL HERNIA REPAIR Bilateral    WRIST SURGERY Right    Patient Active Problem List   Diagnosis Date Noted   Acne 06/10/2021   Elevated PSA 06/10/2021   Osteoarthritis 06/10/2021   Osteoporosis 06/10/2021   Postherpetic neuralgia 06/10/2021   Raynaud's disease 06/10/2021   Rosacea 06/10/2021   Gait abnormality 08/29/2019   Midline low back pain without sciatica 08/29/2019   Mild cognitive impairment 08/29/2019   Excessive daytime sleepiness 01/24/2019   Nocturia more than twice per night 01/24/2019   Hypersomnia with sleep apnea 01/24/2019   At risk for central sleep apnea 01/24/2019   RLS (restless legs syndrome) 01/24/2019   PAF (paroxysmal atrial fibrillation) ([place]) 06/04/2015   Idiopathic Parkinson's disease ([place]) 05/07/2015   Polymyositis ([place]) 05/07/2015    ONSET DATE: PD dx 2016; 10/27/2021 (referral date)  REFERRING DIAG: G20 (ICD-10-CM) - Idiopathic Parkinson's disease   THERAPY DIAG:  No diagnosis found.  Rationale for Evaluation and Treatment Rehabilitation  SUBJECTIVE:   SUBJECTIVE STATEMENT: *** Pt accompanied by: {accompnied:27141}  PERTINENT HISTORY: Parkinson's Disease.  PMH:  hx of polymyositis, hx of orthostatic tachycardia, memory, Raynaud's disease, L CTR, R wrist fx surgery, L hip fracture 2/22 from fall, hx of L elbow fx due to fall 2/22 (surgery x2), sleep apnea, restless leg syndrome, hx of back pain, mild cognitive impairment.   PAIN:  Are you having pain? {OPRCPAIN:27236}  FALLS:  Has patient fallen in last 6 months?  {RAQTMAUQ:33354}  LIVING ENVIRONMENT: Lives with: lives with their spouse Lives in: House/apartment  PLOF:  Level of assistance: Independent with ADLs, Independent with IADLs Employment: Part-time employment Systems developer)  PATIENT GOALS ***  OBJECTIVE:  COGNITION: Overall cognitive status: {cognition:24006} Areas of impairment: {cognitive impairment:24009} Comments: ***  MOTOR SPEECH: Overall motor speech: impaired Level of impairment: Conversation Respiration: thoracic breathing and clavicular breathing Phonation: low vocal intensity Resonance: WFL Articulation: {SLParticulation:27218} Intelligibility: {SLP Intelligible:25442} Motor planning: {slpmotorspeecherrors:27220} Motor speech errors: {SLP motor speech errors:25443} Interfering components: {SLP Interfering components (MS):25444} Effective technique: {SLP effective technique (MS):25445}  ORAL MOTOR EXAMINATION Overall status: {OMESLP2:27645} Comments: ***  OBJECTIVE VOICE ASSESSMENT: Sustained "ah" maximum phonation time: *** seconds Sustained "ah" loudness average: *** dB Oral reading (passage) loudness average: *** dB Oral reading loudness range: *** dB Conversational loudness average: *** dB Conversational loudness range: *** dB Voice quality: {VQL:27192} Stimulability trials: Given SLP modeling and {frequency:26928} {level:26929} cues, loudness average increased to ***dB (range of *** to ***) at (loud "ah", word, sentence, paragraph, conversation) level.  Comments: ***  Pt does/does not report difficulty with swallowing warranting further evaluation.  PATIENT REPORTED OUTCOME MEASURES (PROM): {SLPPROM:27095}  TODAY'S TREATMENT:  ***   PATIENT EDUCATION: Education details: see above Person educated: Patient Education method: Explanation, Demonstration, and Handouts Education comprehension: verbalized understanding, returned demonstration, and needs further  education   HOME EXERCISE PROGRAM: Speak Out! Program     GOALS: Goals reviewed with patient? Yes  SHORT TERM GOALS: Target date: 11/24/2021    Pt will complete Speak Out! HEP  at least 1x/day given occasional min A over  sessions  Baseline: Goal status: INITIAL  2.  Pt will achieve targeted dB (85-90 dB) on warm up exercises with 80% accuracy given occasional min A over 2 sessions  Baseline:  Goal status: INITIAL  3.  Pt will achieve targeted dB (75-85 dB) on reading exercises with 80% accuracy given occasional min A over 2 sessions Baseline:  Goal status: INITIAL  4.  Pt will achieve targeted dB (72-78 dB) on cognitive exercises with 80% accuracy given occasional min A over 2 sessions Baseline:  Goal status: INITIAL  5.  Pt will utilize dysarthria compensations in 5-10 minute conversation to optimize vocal intensity and clarity given occasional min A over 2 sessions  Baseline:  Goal status: INITIAL  6.  *** Baseline:  Goal status: {GOALSTATUS:25110}  LONG TERM GOALS: Target date: {follow up:25551}  (Remove Blue Hyperlink)  Pt will complete Speak Out! HEP at least 1x/day (BID recommended) > 1 week  Baseline:  Goal status: INITIAL  2.  Pt will achieve targeted dB levels in demonstration of Speak Out! Lessons with 90% accuracy given rare min A over 3 sessions  Baseline:  Goal status: INITIAL  3.  Pt will utilize dysarthria compensations in 15+ minute conversation to optimize vocal intensity and clarity given rare min A over 2 sessions Baseline:  Goal status: INITIAL  4.  Pt will report improved communication effectiveness via PROM by 2 point improvement by last ST session  Baseline:  Goal status: INITIAL  5.  *** Baseline:  Goal status: {GOALSTATUS:25110}  6.  *** Baseline:  Goal status: {GOALSTATUS:25110}  ASSESSMENT:  CLINICAL IMPRESSION: Patient is a 76 y.o. male who was seen today for idiopathic Parkinson's Disease. Today, pt presents with ***  hypokinetic dysarthria.   OBJECTIVE IMPAIRMENTS  Objective impairments include attention, memory, and dysarthria. These impairments are limiting patient from household responsibilities and effectively communicating at home and in community.Factors affecting potential to achieve goals and functional outcome are medical prognosis. Patient will benefit from skilled SLP services to address above impairments and improve overall function.  REHAB POTENTIAL: Good  PLAN: SLP FREQUENCY: {rehab frequency:25116}  SLP DURATION: {rehab duration:25117}  PLANNED INTERVENTIONS: {SLP treatment/interventions:25449}    Marzetta Board, CCC-SLP 10/27/2021, 9:27 AM

## 2021-10-28 ENCOUNTER — Other Ambulatory Visit: Payer: Self-pay | Admitting: Neurology

## 2021-10-28 DIAGNOSIS — M3320 Polymyositis, organ involvement unspecified: Secondary | ICD-10-CM

## 2021-10-28 DIAGNOSIS — G20A1 Parkinson's disease without dyskinesia, without mention of fluctuations: Secondary | ICD-10-CM

## 2021-10-28 DIAGNOSIS — R269 Unspecified abnormalities of gait and mobility: Secondary | ICD-10-CM

## 2021-10-28 DIAGNOSIS — G20 Parkinson's disease: Secondary | ICD-10-CM

## 2021-10-28 NOTE — Progress Notes (Signed)
Orders Placed This Encounter  Procedures   Ambulatory referral to Physical Therapy   Ambulatory referral to Occupational Therapy   Ambulatory referral to Speech Therapy

## 2021-10-30 NOTE — Progress Notes (Signed)
PATIENT: Alexander Thomas DOB: [birth year]  REASON FOR VISIT: Follow up for Parkinson's Disease  HISTORY FROM: Patient, wife  PRIMARY NEUROLOGIST: Dr. Krista Blue  HISTORY  WALKER SITAR is a 76 years old right-handed male, accompanied by his wife, seen in refer by his primary care physician Dr. Jani Gravel for evaluation of right hand tremor, Initial evaluation was in August 2016   I have reviewed and summarized his most recent office note   He had a past medical history of hyperlipidemia, Raynaud's disease,, had left carpal tunnel release surgery, right wrist fracture surgery, polymyositis,  left deltoid muscle biopsy consistent with fiber atrophy, perivasculitis in August 2002, at its worst, he had difficulty standing up, need his wife help him dressing, he has taken methotrexate along with low-dose prednisone for many years, in 2015, he was switched to Imuran 50 mg twice a day, most recent CPK level was 83, he denies significant muscle weakness now   I also reviewed laboratory results in July 2016, normal CMP, CBC, CPK 83, mild elevated LDL 134, normal ESR 6, hepatitis panel was negative   He is a retired Company secretary, since 2014, he noticed mild right hand tremor, getting worse gradually, there was no significant gait difficulty, mild lack of facial expression, he has lack of smell, which has been a chronic issues, he denies REM sleep disorder, mild constipations.   Update February 04 2015: He was diagnosed with mild Parkinson's disease, was started on Azilect since August 2016, does not notice any symptomatic improvement either,   MRI of the brain without contrast in August 2016, mild supratentorium small vessel disease, no acute lesions.   UPDATE May 07 2015: He was started on requip xr titrating dose since October 2016, He still has mild right hand shaking, left hand is less, he is now taking Azilect 1 mg every day, requip xr 2 mg 2 tablets every night at 8pm.  He did not noticed any  significant side effect.  It does help his tremor   He denies muscle weakness or muscle pain, he had a history of inflammatory polymyositis   UPDATE July 17th 2017: He is taking Azilect 81m qday, he tolerated requip xL 429m2 tabs po qhs well, he still has tremor, he sleeps well, he lost sense of smell, he has no significant gait abnormality, he has ok appetite, he exercise regularly,  5-10 mintues sit ups at that time, he denies obsessive compulsive behavior.   Update August 10 2016: Parkinson's Disease,  He is now taking ropinirole XL 4 mg 4 tablets every night, Azilect 1 mg every morning, there was no significant fluctuation noticed, he ambulate without much difficulty, does has intermittent right hand resting tremor,   Inflammatory polymyositis Imuran 50 mg 2 tablets every day He is doing well, still has significant right hand tremor,    UPDATE November 16 2017: He feels fine, continue to pastor a smaller church, taking Requip xl 1m31mtabs qhs, azilect 1mg61mily, imuran 50mg8mabs every day, he denies significant muscle weakness, stated he can walk better, but has mild increased drooling, he denies significant memory loss,   UPDATE Feb 6th 2020: He is accompanied by his wife at today's clinical visit, missing his medication frequently, slow worsening memory loss, Today's [place] examination is 23 out of 30 no significant muscle weakness, He still passed her small church of elderly attendants   UPDATE Aug 29 2019: He has constant right hand tremor, 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,  was noted by his wife has increased gait abnormality, mild memory loss, MoCA examination is 21/30 today   He also complains of significant low back pain, stay at midline, unbearable sometimes, especially after prolonged standing or walking   Sleep study in October 2020 showed no significant obstructive sleep apnea, he has frequent nocturia, wake up 3-4 times each night, usually able to go back to sleep without much  difficulty   Update March 26, 2020 Wife concerned about his weight loss, 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   Update July 30, 2020 He fell broke his left elbow require surgery on June 02, 2020, also suffered a left pelvic fracture, now just began to weightbearing, he stayed at nursing home since then, he tried to limit his water intake worried about going to the bathroom   He was noted to have orthostatic dizziness, hypotension, today he did not have significant blood pressure drop getting up from seated position, but there was significant orthostatic tachycardia   Blood pressure sitting down 122/75/80; standing up 115/64, heart rate of 96; standing up for 1 minute 122/72, heart rate of 96; standing up to 3 minutes 128/77, heart rate of 103, is mildly symptomatic,   UPDATE December 02 2020: He is accompanied by his wife at today's visit, overall stable, he spent most time sitting down, still gives sermon once a week, chronic taking Sinemet 25/100 mg 1 tablet in the morning, because mostly sedentary, he does not complains of orthostatic dizziness getting home, today sitting down blood pressure 120/70, standing up 110/70, he did not complains of orthostatic symptoms, but apparently he has increased gait abnormality, was also undermedicated with Sinemet  Update June 10, 2021 SS: Still pastor of church, 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 Just finished PT/ST/OT, was very helpful, going back in summer. Feels like freezing, gets stuck with walking, think his way out of it, can utilize his therapy tactics. Resting tremor left hand. Uses cane, walking. Only takes Sinemet once daily. In ER last week for left hip and left knee pain. Eats well, sleeping good.  Mild drooling   Update November 04, 2021 SS: [facility] 21/30, here with his wife. Only taking Sinemet 25/100 1 tablet daily in AM, didn't increase to 3 times daily. Claims when taking higher dose had nausea or hypotension. Some visual hallucinations of floor moving, someone sitting on fireplace basket. Sleeps like a baby. Few falls in the yard, able to get back up. Using walker. Stands at [employer] for 30 min on Sunday. Starting PT/OT/ST in August. Has urinary urgency/frequency.   REVIEW OF SYSTEMS: Out of a complete 14 system review of symptoms, the patient complains only of the following symptoms, and all other reviewed systems are negative.  See HPI  ALLERGIES: No Known Allergies  HOME MEDICATIONS: Outpatient Medications Prior to Visit  Medication Sig Dispense Refill   azaTHIOprine (IMURAN) 50 MG tablet Take 1 tablet (50 mg total) by mouth in the morning and at bedtime. 180 tablet 4   carbidopa-levodopa (SINEMET IR) 25-100 MG tablet Take one tablet three times daily. 270 tablet 4   doxycycline (VIBRAMYCIN) 100 MG capsule as needed.   3   gabapentin (NEURONTIN) 300 MG capsule as needed.      magnesium hydroxide (MILK OF  MAGNESIA) 400 MG/5ML suspension Take 5 mLs by mouth as needed for mild constipation.     rasagiline (AZILECT) 1 MG TABS tablet Take 1 tablet (1 mg total) by mouth daily. 90 tablet 3   terbinafine (LAMISIL) 250 MG tablet Take 250 mg by mouth daily.     COD LIVER OIL PO Take by mouth. (Patient not taking: Reported on 11/04/2021)     No facility-administered medications prior to visit.    PAST MEDICAL HISTORY: Past Medical History:  Diagnosis Date   Hypercholesteremia    Migraine    Polymyositis ([facility])    Raynaud disease    Tremor of right hand     PAST SURGICAL HISTORY: Past Surgical History:  Procedure Laterality Date   CARPAL TUNNEL RELEASE Left    INGUINAL HERNIA REPAIR Bilateral    WRIST SURGERY Right     FAMILY HISTORY: Family History  Problem Relation Age of  Onset   Uterine cancer Mother    Healthy Father    Kidney cancer Brother    Colon cancer Brother     SOCIAL HISTORY: Social History   Socioeconomic History   Marital status: Married    Spouse name: Dollie   Number of children: 2   Years of education: PhD   Highest education level: Not on file  Occupational History   Occupation: Company secretary  Tobacco Use   Smoking status: Never   Smokeless tobacco: Never  Substance and Sexual Activity   Alcohol use: No    Alcohol/week: 0.0 standard drinks of alcohol   Drug use: No   Sexual activity: Not on file  Other Topics Concern   Not on file  Social History Narrative   Lives at home with his wife.   Right-handed.   1 cup caffeine daily.   Social Determinants of Health   Financial Resource Strain: Not on file  Food Insecurity: Not on file  Transportation Needs: Not on file  Physical Activity: Not on file  Stress: Not on file  Social Connections: Not on file  Intimate Partner Violence: Not on file   PHYSICAL EXAM  Vitals:   11/04/21 1246  BP: 108/70  Pulse: 89  Weight: 156 lb (70.8 kg)  Height: 5' 9"  (1.753 m)    Body mass index is 23.04 kg/m. Generalized: Well developed, in no acute distress  Neurological examination  Mentation: Alert oriented to time, place, history taking. Follows all commands speech and language fluent, soft voice, hoarse Cranial nerve II-XII: Pupils were equal round reactive to light. Extraocular movements were full, visual field were full on confrontational test. Facial sensation and strength were normal. Head turning and shoulder shrug  were normal and symmetric.  Moderate masking of the face is seen, mild drooling. Motor: Good strength all extremities, resting tremor left and right (R> L) upper extremity, right more than left bradykinesia Sensory: Sensory testing is intact to soft touch on all 4 extremities. No evidence of extinction is noted.  Coordination: Cerebellar testing reveals good  finger-nose-finger and heel-to-shin bilaterally.  Gait and station: Has to push off from seated position to stand, trouble initiating gait, small shuffling steps, forward leaning   DIAGNOSTIC DATA (LABS, IMAGING, TESTING) - I reviewed patient records, labs, notes, testing and imaging myself where available.  Lab Results  Component Value Date   WBC 5.8 03/26/2020   HGB 14.6 03/26/2020   HCT 44.0 03/26/2020   MCV 92 03/26/2020      Component Value Date/Time   NA 138 03/26/2020 1420  K 4.7 03/26/2020 1420   CL 98 03/26/2020 1420   CO2 30 (H) 03/26/2020 1420   GLUCOSE 87 03/26/2020 1420   BUN 14 03/26/2020 1420   CREATININE 0.83 03/26/2020 1420   CALCIUM 9.4 03/26/2020 1420   PROT 7.2 03/26/2020 1420   ALBUMIN 4.2 03/26/2020 1420   AST 13 03/26/2020 1420   ALT 8 03/26/2020 1420   ALKPHOS 76 03/26/2020 1420   BILITOT 0.2 03/26/2020 1420   GFRNONAA 87 03/26/2020 1420   GFRAA 100 03/26/2020 1420   No results found for: "CHOL", "HDL", "LDLCALC", "LDLDIRECT", "TRIG", "CHOLHDL" No results found for: "HGBA1C" No results found for: "VITAMINB12" Lab Results  Component Value Date   TSH 2.030 03/26/2020   ASSESSMENT AND PLAN 76 y.o. year old male   1.  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 Krista Blue  2.  History of polymyositis -Stable, Continue Imuran 50 mg twice daily  Butler Denmark, AGNP-C, DNP 11/04/2021, 12:55 PM [facility] [address], [place], [place] [address] [phone]

## 2021-11-03 DIAGNOSIS — B351 Tinea unguium: Secondary | ICD-10-CM | POA: Diagnosis not present

## 2021-11-03 DIAGNOSIS — C4442 Squamous cell carcinoma of skin of scalp and neck: Secondary | ICD-10-CM | POA: Diagnosis not present

## 2021-11-03 DIAGNOSIS — L0211 Cutaneous abscess of neck: Secondary | ICD-10-CM | POA: Diagnosis not present

## 2021-11-03 DIAGNOSIS — L570 Actinic keratosis: Secondary | ICD-10-CM | POA: Diagnosis not present

## 2021-11-04 ENCOUNTER — Encounter: Payer: Self-pay | Admitting: Neurology

## 2021-11-04 ENCOUNTER — Ambulatory Visit (INDEPENDENT_AMBULATORY_CARE_PROVIDER_SITE_OTHER): Payer: Medicare Other | Admitting: Neurology

## 2021-11-04 VITALS — BP 108/70 | HR 89 | Ht 69.0 in | Wt 156.0 lb

## 2021-11-04 DIAGNOSIS — G3184 Mild cognitive impairment, so stated: Secondary | ICD-10-CM

## 2021-11-04 DIAGNOSIS — G20 Parkinson's disease: Secondary | ICD-10-CM | POA: Diagnosis not present

## 2021-11-04 DIAGNOSIS — M3320 Polymyositis, organ involvement unspecified: Secondary | ICD-10-CM | POA: Diagnosis not present

## 2021-11-04 MED ORDER — RASAGILINE MESYLATE 1 MG PO TABS: 1.0000 mg | ORAL_TABLET | Freq: Every day | ORAL | 3 refills | Status: DC

## 2021-11-04 MED ORDER — CARBIDOPA-LEVODOPA 25-100 MG PO TABS: ORAL_TABLET | ORAL | 4 refills | Status: AC

## 2021-11-04 MED ORDER — AZATHIOPRINE 50 MG PO TABS: 50.0000 mg | ORAL_TABLET | Freq: Two times a day (BID) | ORAL | 4 refills | Status: AC

## 2021-11-04 NOTE — Patient Instructions (Signed)
Try to increase the Sinemet to 1 tablet 3 times daily, can start taking with food to minimize side effect, then work to take 30-60 minutes before or after meals, drink plenty of water

## 2021-11-17 DIAGNOSIS — Z125 Encounter for screening for malignant neoplasm of prostate: Secondary | ICD-10-CM | POA: Diagnosis not present

## 2021-11-17 DIAGNOSIS — Z0000 Encounter for general adult medical examination without abnormal findings: Secondary | ICD-10-CM | POA: Diagnosis not present

## 2021-11-17 DIAGNOSIS — Z8639 Personal history of other endocrine, nutritional and metabolic disease: Secondary | ICD-10-CM | POA: Diagnosis not present

## 2021-11-17 DIAGNOSIS — G20 Parkinson's disease: Secondary | ICD-10-CM | POA: Diagnosis not present

## 2021-11-17 DIAGNOSIS — M3322 Polymyositis with myopathy: Secondary | ICD-10-CM | POA: Diagnosis not present

## 2021-11-17 DIAGNOSIS — R972 Elevated prostate specific antigen [PSA]: Secondary | ICD-10-CM | POA: Diagnosis not present

## 2021-11-17 DIAGNOSIS — I7300 Raynaud's syndrome without gangrene: Secondary | ICD-10-CM | POA: Diagnosis not present

## 2021-11-17 DIAGNOSIS — R7309 Other abnormal glucose: Secondary | ICD-10-CM | POA: Diagnosis not present

## 2021-11-17 DIAGNOSIS — M810 Age-related osteoporosis without current pathological fracture: Secondary | ICD-10-CM | POA: Diagnosis not present

## 2021-11-17 DIAGNOSIS — B0229 Other postherpetic nervous system involvement: Secondary | ICD-10-CM | POA: Diagnosis not present

## 2021-11-17 DIAGNOSIS — M1990 Unspecified osteoarthritis, unspecified site: Secondary | ICD-10-CM | POA: Diagnosis not present

## 2021-11-24 NOTE — Therapy (Signed)
OUTPATIENT OCCUPATIONAL THERAPY PARKINSON'S EVALUATION  Patient Name: Alexander Thomas MRN: [id number] DOB:1945-06-27, 76 y.o., male Today's Date: 11/25/2021  PCP: Janie Morning, DO REFERRING PROVIDER: Dr. Marcial Pacas   OT End of Session - 11/25/21 1518     Visit Number 1    Number of Visits 13    Date for OT Re-Evaluation 02/23/22    Authorization Type Medicare & BCBS (BCBS will cover remaining from [facility] Medicare guidelines    Authorization - Visit Number 1    Authorization - Number of Visits 10    Progress Note Due on Visit 10    OT Start Time 1105    OT Stop Time 1145    OT Time Calculation (min) 40 min    Activity Tolerance Patient tolerated treatment well    Behavior During Therapy WFL for tasks assessed/performed             Past Medical History:  Diagnosis Date   Hypercholesteremia    Migraine    Polymyositis ([place])    Raynaud disease    Tremor of right hand    Past Surgical History:  Procedure Laterality Date   CARPAL TUNNEL RELEASE Left    INGUINAL HERNIA REPAIR Bilateral    WRIST SURGERY Right    Patient Active Problem List   Diagnosis Date Noted   Acne 06/10/2021   Elevated PSA 06/10/2021   Osteoarthritis 06/10/2021   Osteoporosis 06/10/2021   Postherpetic neuralgia 06/10/2021   Raynaud's disease 06/10/2021   Rosacea 06/10/2021   Gait abnormality 08/29/2019   Midline low back pain without sciatica 08/29/2019   Mild cognitive impairment 08/29/2019   Excessive daytime sleepiness 01/24/2019   Nocturia more than twice per night 01/24/2019   Hypersomnia with sleep apnea 01/24/2019   At risk for central sleep apnea 01/24/2019   RLS (restless legs syndrome) 01/24/2019   PAF (paroxysmal atrial fibrillation) ([place]) 06/04/2015   Idiopathic Parkinson's disease ([place]) 05/07/2015   Polymyositis ([place]) 05/07/2015    ONSET DATE: 10/27/21 (MD referral)  REFERRING DIAG: Parkinson's Disease  THERAPY DIAG:  Other symptoms and signs involving the  nervous system  Other symptoms and signs involving the musculoskeletal system  Abnormal posture  Stiffness of right shoulder, not elsewhere classified  Stiffness of left shoulder, not elsewhere classified  Other lack of coordination  Stiffness of right elbow, not elsewhere classified  Tremor  Stiffness of left elbow, not elsewhere classified  Unsteadiness on feet  Other abnormalities of gait and mobility  Attention and concentration deficit  Rationale for Evaluation and Treatment Rehabilitation  SUBJECTIVE:   SUBJECTIVE STATEMENT: Pt reports only taking sinemet 1x day in am  Pt accompanied by: self  PERTINENT HISTORY: Parkinson's Disease   PMH:  hx of polymyositis, hx of orthostatic tachycardia, memory, Raynaud's disease, L CTR, R wrist fx surgery, L hip fracture 2/22 from fall, hx of L elbow fx due to fall 2/22 (surgery x2), sleep apnea, restless leg syndrome, hx of back pain, mild cognitive impairment.  PRECAUTIONS: Fall  WEIGHT BEARING RESTRICTIONS No  PAIN:  Are you having pain? Yes: NPRS scale: 4/10 Pain location: L hip, sometimes knee Pain description: sharp-knee, dull sometimes sharp-hip Aggravating factors: unknown Relieving factors: sitting  FALLS: Has patient fallen in last 6 months? Yes. Number of falls "slipped a time or 2 in the yard"--able to to get up himself  LIVING ENVIRONMENT: Lives with: lives with their family and lives with their spouse Lives in: House/apartment Has following equipment at home: Single point cane, Environmental consultant -  2 wheeled, Shower bench, and Grab bars  PLOF: Independent, Vocation/Vocational requirements: retired, but continues preach on Sundays for [employer], and Leisure: has enjoyed Medical illustrator, gardening, carpentry  PATIENT GOALS  improve movement  OBJECTIVE:   HAND DOMINANCE: Right  ADLs:  Transfers/ambulation related to ADLs:  min difficulty Eating: uses weighted spoon, occasional fork use, wife  cutting food due to difficulty Grooming: wife performs shaving, pt able to brush teeth/comb hair UB Dressing: increased time, mod I LB Dressing: difficulty with socks so that wife assist, elastic waist, significantly incr time (wife helps sometimes for convenience), has long handled shoe horn and slip on shoes Toileting: min difficulty with sit>stand (easier with higher toilet in 1 bathroom, grab bar), incr time for hygiene/clothes management Bathing: mod I Tub Shower transfers: mod I Equipment: Transfer tub bench, Grab bars, Walk in shower, and hand-held shower head   IADLs: Shopping: goes with wife, doesn't go inside much Light housekeeping: pt drives tractor/riding mower, dusts, picking up  Meal Prep: makes breakfast some, warming things in microwave.  Wife does most of cooking and eats out a lot. Community mobility: pt reports that he hasn't driven in approx 1 year, wife drives. Medication management: wife performs/reminds pt Financial management: wife preforms most, but pt assist  Handwriting: 100% legible, Mild micrographia, and for 1 sentence, pt reports that he gets smaller as he writes  MOBILITY STATUS: Hx of falls, Freezing, Festination, difficulty with turns, start hesitation, and difficulty carrying objections with ambulation  POSTURE COMMENTS:  rounded shoulders, forward head, and flexed trunk   ACTIVITY TOLERANCE: Activity tolerance: fatigues quickly when walking  FUNCTIONAL OUTCOME MEASURES: Standing functional reach: NT due to time constraints Fastening/unfastening 3 buttons: 72.32sec Physical performance test: PPT#2 (simulated eating) 19.53sec (holds spoon at end) & PPT#4 (donning/doffing jacket): Not tested due to time constraints  COORDINATION: 9 Hole Peg test: Right: 44.31 sec; Left: 39.66 sec Box and Blocks:  Right 30 blocks, Left 23blocks Tremors: Resting, Right, Left, and R>L  UE ROM:   R shoulder flex 120* with -50* elbow ext, L shoulder flex 120* with  -55* elbow ext (decr due to old injury)  UE MMT:   Not tested  SENSATION: Not tested  MUSCLE TONE: RUE: Moderate and Rigidity and LUE: Moderate and Rigidity  COGNITION: Overall cognitive status: Impaired and hx of mild cognitive impairment per Epic   OBSERVATIONS: Bradykinesia   TODAY'S TREATMENT:  Eval completed.  Also see pt education.   PATIENT EDUCATION: Education details: Timing of medication with meals and recommendation to take PD meds 3x/day as directed, review use of PWR! Hand stretch with writing, OT eval results and POC Person educated: Patient Education method: Explanation Education comprehension: verbalized understanding   HOME EXERCISE PROGRAM: Not yet issued updated HEP  ASSESSMENT:  CLINICAL IMPRESSION: Patient is a 76 y.o. male who was seen today for occupational therapy evaluation for Parkinson's Disease.  Pt is familiar to this therapist.  Pt was last seen for occupational therapy 04/2021 and it was recommended at discharge that pt return for re-evaluation in approx. 6 months. Pt with PMH that includes  hx of polymyositis, hx of orthostatic tachycardia, memory, Raynaud's disease, L CTR, R wrist fx surgery, L hip fracture 2/22 from fall, hx of L elbow fx due to fall 2/22 (surgery x2), sleep apnea, restless leg syndrome, hx of back pain, mild cognitive impairment.  Pt presents with bradykinesia, rigidity, decr coordination, tremor, abnormal posture, decr balance for ADLs, and cognitive deficits.  Pt would  benefit from occupational therapy to address these deficits in order to improve ADL/IADL performance, improve quality of life, update PD-specific HEP, and prevent future complications.     PERFORMANCE DEFICITS in functional skills including ADLs, IADLs, coordination, dexterity, tone, ROM, FMC, GMC, mobility, balance, endurance, decreased knowledge of precautions, decreased knowledge of use of DME, and UE functional use, cognitive skills including memory, and  psychosocial skills including environmental adaptation and habits.   IMPAIRMENTS are limiting patient from ADLs, IADLs, work, and leisure.   COMORBIDITIES may have co-morbidities  that affects occupational performance. Patient will benefit from skilled OT to address above impairments and improve overall function.  MODIFICATION OR ASSISTANCE TO COMPLETE EVALUATION: Min-Moderate modification of tasks or assist with assess necessary to complete an evaluation.  OT OCCUPATIONAL PROFILE AND HISTORY: Detailed assessment: Review of records and additional review of physical, cognitive, psychosocial history related to current functional performance.  CLINICAL DECISION MAKING: Moderate - several treatment options, min-mod task modification necessary  REHAB POTENTIAL: Good  EVALUATION COMPLEXITY: Moderate     GOALS: Potential Goals reviewed with patient? Yes  SHORT TERM GOALS: Target date: 12/23/2021   Pt will be independent with updated PD-specific HEP. Goal status: INITIAL  2.  Pt will verbalize understanding of ways to decr risk of complications related to PD. Goal status: INITIAL  3.  Pt will improve RUE functional reaching and coordination for ADLs as shown by improving score on box and blocks test by at least 3 with RUE. Baseline:  30 blocks Goal status: INITIAL  4.  Pt will improve RUE functional reaching and coordination for ADLs as shown by improving score on box and blocks test by at least 3 with LUE. Baseline:  23 blocks Goal status: INITIAL  5.  Pt will demo at least 120* shoulder flex with at least -40* elbow ext with RUE for overhead reach. Baseline:  120* with -50* elbow ext Goal status: INITIAL   LONG TERM GOALS: Target date: 11/6//2023     Pt will verbalize understanding of adaptive strategies/AE to incr safety/ease with ADLs/IADLs. Goal status: INITIAL  2.  Pt will improve RUE functional reaching and coordination for ADLs as shown by improving time on 9-hole peg  test by at least 3sec with RUE. Baseline:  44.31sec Goal status: INITIAL  3.  Pt will improve bilatateral coordination and incr ease with dressing as shown by fastening/unfastening 3 buttons in less than 60sec. Baseline: 72.32sec Goal status: INITIAL  4.  Assess standing functional reach and establish goal as appropriate Goal status: INITIAL  5.  Assess PPT#4 (donning/doffing jacket) and establish goal as appropriate.   Goal status: INITIAL   PLAN: OT FREQUENCY: 1x/week at pt request  OT DURATION: 12 weeks +eval (may d/c after 8 weeks)  PLANNED INTERVENTIONS: self care/ADL training, therapeutic exercise, therapeutic activity, neuromuscular re-education, passive range of motion, gait training, balance training, functional mobility training, ultrasound, moist heat, cryotherapy, patient/family education, cognitive remediation/compensation, and DME and/or AE instructions  RECOMMENDED OTHER SERVICES: current with PT, ST  CONSULTED AND AGREED WITH PLAN OF CARE: Patient  PLAN FOR NEXT SESSION: check PPT#4 and standing functional reach and establish goal as appropriate; begin review/update of HEP   Ruchi Stoney, OTR/L 11/25/2021, 3:22 PM

## 2021-11-25 ENCOUNTER — Ambulatory Visit: Payer: Medicare Other

## 2021-11-25 ENCOUNTER — Ambulatory Visit: Payer: Medicare Other | Attending: Family Medicine | Admitting: Occupational Therapy

## 2021-11-25 ENCOUNTER — Ambulatory Visit: Payer: Medicare Other | Admitting: Physical Therapy

## 2021-11-25 ENCOUNTER — Encounter: Payer: Self-pay | Admitting: Occupational Therapy

## 2021-11-25 DIAGNOSIS — M25621 Stiffness of right elbow, not elsewhere classified: Secondary | ICD-10-CM | POA: Diagnosis not present

## 2021-11-25 DIAGNOSIS — R29818 Other symptoms and signs involving the nervous system: Secondary | ICD-10-CM | POA: Insufficient documentation

## 2021-11-25 DIAGNOSIS — R29898 Other symptoms and signs involving the musculoskeletal system: Secondary | ICD-10-CM | POA: Insufficient documentation

## 2021-11-25 DIAGNOSIS — M25622 Stiffness of left elbow, not elsewhere classified: Secondary | ICD-10-CM | POA: Diagnosis not present

## 2021-11-25 DIAGNOSIS — R278 Other lack of coordination: Secondary | ICD-10-CM | POA: Insufficient documentation

## 2021-11-25 DIAGNOSIS — R293 Abnormal posture: Secondary | ICD-10-CM | POA: Insufficient documentation

## 2021-11-25 DIAGNOSIS — R2689 Other abnormalities of gait and mobility: Secondary | ICD-10-CM

## 2021-11-25 DIAGNOSIS — R471 Dysarthria and anarthria: Secondary | ICD-10-CM | POA: Diagnosis not present

## 2021-11-25 DIAGNOSIS — R251 Tremor, unspecified: Secondary | ICD-10-CM | POA: Insufficient documentation

## 2021-11-25 DIAGNOSIS — Z9181 History of falling: Secondary | ICD-10-CM

## 2021-11-25 DIAGNOSIS — M25612 Stiffness of left shoulder, not elsewhere classified: Secondary | ICD-10-CM | POA: Diagnosis not present

## 2021-11-25 DIAGNOSIS — M25611 Stiffness of right shoulder, not elsewhere classified: Secondary | ICD-10-CM | POA: Insufficient documentation

## 2021-11-25 DIAGNOSIS — R41841 Cognitive communication deficit: Secondary | ICD-10-CM

## 2021-11-25 DIAGNOSIS — R2681 Unsteadiness on feet: Secondary | ICD-10-CM

## 2021-11-25 DIAGNOSIS — R41840 Attention and concentration deficit: Secondary | ICD-10-CM | POA: Insufficient documentation

## 2021-11-25 NOTE — Therapy (Signed)
OUTPATIENT SPEECH LANGUAGE PATHOLOGY PARKINSON'S EVALUATION   Patient Name: Alexander Thomas MRN: [id number] DOB:1945/05/13, 76 y.o., male Today's Date: 11/25/2021  PCP: Janie Morning, DO REFERRING PROVIDER: Marcial Pacas, MD    End of Session - 11/25/21 0959     Visit Number 1    Number of Visits 13    Date for SLP Re-Evaluation 02/20/22    Authorization Type Medicare    SLP Start Time 1015    SLP Stop Time  1100    SLP Time Calculation (min) 45 min    Activity Tolerance Patient tolerated treatment well             Past Medical History:  Diagnosis Date   Hypercholesteremia    Migraine    Polymyositis ([place])    Raynaud disease    Tremor of right hand    Past Surgical History:  Procedure Laterality Date   CARPAL TUNNEL RELEASE Left    INGUINAL HERNIA REPAIR Bilateral    WRIST SURGERY Right    Patient Active Problem List   Diagnosis Date Noted   Acne 06/10/2021   Elevated PSA 06/10/2021   Osteoarthritis 06/10/2021   Osteoporosis 06/10/2021   Postherpetic neuralgia 06/10/2021   Raynaud's disease 06/10/2021   Rosacea 06/10/2021   Gait abnormality 08/29/2019   Midline low back pain without sciatica 08/29/2019   Mild cognitive impairment 08/29/2019   Excessive daytime sleepiness 01/24/2019   Nocturia more than twice per night 01/24/2019   Hypersomnia with sleep apnea 01/24/2019   At risk for central sleep apnea 01/24/2019   RLS (restless legs syndrome) 01/24/2019   PAF (paroxysmal atrial fibrillation) ([place]) 06/04/2015   Idiopathic Parkinson's disease ([place]) 05/07/2015   Polymyositis ([place]) 05/07/2015    ONSET DATE: PD dx 2016; 10/27/2021 (referral date)  REFERRING DIAG: G20 (ICD-10-CM) - Idiopathic Parkinson's disease   THERAPY DIAG:  Dysarthria and anarthria  Cognitive communication deficit  Rationale for Evaluation and Treatment Rehabilitation  SUBJECTIVE:   SUBJECTIVE STATEMENT: "I'm about the same" Pt accompanied by: self  PERTINENT HISTORY: Per  most recent neurology appointment: "Again appears undermedicated, encouraged to work on increasing Sinemet 25/100 mg 1 tablet 3 times daily, can take with food initially to minimize nausea side effects, Ensure drinks plenty of water; try using hard candy for drooling. 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 Continue Azilect 1 mg daily, Requip resulted in worsening memory, MoCA 21/30 today"  PAIN:  Are you having pain? Yes: NPRS scale: 4-5/10 Pain location: knee and hip Pain description: dull ache Aggravating factors: none Relieving factors: resting  FALLS: Has patient fallen in last 6 months?  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 Reported need for increased repetition to aid recall. Reduced awareness of deficits and functional impact exhibited.   MOTOR SPEECH: Overall motor speech: impaired Level of impairment: Conversation Respiration: thoracic breathing and clavicular breathing Phonation: hoarse Resonance: WFL Articulation: Appears intact Intelligibility: Intelligibility reduced Motor planning: Appears intact Interfering components:  PD Effective technique: increased vocal intensity, over articulate, and pause  ORAL MOTOR EXAMINATION Overall status: Impaired:   Labial: Bilateral (Strength and Sensation) Comments: Intermittent drooling  exhibited  OBJECTIVE VOICE ASSESSMENT: Sustained "ah" maximum phonation time: 9 seconds Sustained "ah" loudness average: 80 dB Oral reading (passage) loudness average: 74 dB Oral reading loudness range: 72-78 dB Conversational loudness average: low 70s dB Conversational loudness range: 68-73 dB Voice quality: hoarse, strained, and vocal fatigue Stimulability trials: Given SLP modeling and usual min cues, vocal quality improved and loudness average increased to 86dB for loud "ah".  Comments: Pt able to maintain low 70s dB in conversation with intermittent increasing to persistent hoarseness exhibited in conversation. 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   Completed audio recording of patients baseline voice without cueing from SLP: No  Pt does not report difficulty with swallowing which does not warrant further evaluation.  PATIENT REPORTED OUTCOME MEASURES (PROM): V-RQOL: 15 (moderate problem=run out of air; mild problem=trouble speaking loudly or being heard in noisy situation, trouble practicing my profession, repeat self to be understood)  TODAY'S TREATMENT:  11-25-21: Discussed any possible changes in communication, cognition, and swallow function. Pt reports some minimal changes in voice and memory since last course of ST intervention, although hoarseness notably worse (conversational volume was sufficient today). SLP educated patient on recommendation to address vocal quality and possibly memory changes, in which pt agreeable. SLP assisted with ordering Speak Out! Workbook today.    PATIENT EDUCATION: Education details: see above Person educated: Patient Education method: Customer service manager Education comprehension: verbalized understanding, returned demonstration, and needs further education   HOME EXERCISE  PROGRAM: Speak Out! Program   GOALS: Goals reviewed with patient? Yes  SHORT TERM GOALS: Target date: 12/23/2021    Pt will complete Speak Out! HEP at least 1x/day given occasional min A over 2 sessions  Baseline: Goal status: INITIAL  2.  Pt will achieve targeted dB level and clear vocal quality in demonstration of Speak Out! Lessons with 80% accuracy given occasional mod A over 2 sessions Baseline:  Goal status: INITIAL  3.  Pt will utilize dysarthria compensations to optimize vocal intensity and clarity in 5-10 minute conversation given occasional mod A over 2 sessions Baseline:  Goal status: INITIAL  4. Pt will utilize cognitive compensations to aid recall of pertinent information (as needed) given occasional mod A over 2 sessions Baseline:  Goal status: INITIAL  5.  Pt will implement saliva management strategies to reduce drooling given occasional mod A over 2 sessions  Baseline:  Goal status: INITIAL   LONG TERM GOALS: Target date: 02/20/2022  Pt will complete Speak Out! HEP at least 1x/day (BID recommended) over 1 week  Baseline:  Goal status: INITIAL  2.  Pt will achieve targeted dB levels and clear vocal quality in demonstration of Speak Out! Lessons with 90% accuracy given occasional min A over 2 sessions Baseline:  Goal status: INITIAL  3.  Pt will utilize dysarthria compensations to optimize vocal intensity and clarity in 20+ minute conversation given occasional min A over 2 sessions Baseline:  Goal status: INITIAL  4.  Pt will utilize cognitive compensations to aid recall of pertinent information (as needed) given occasional min A over 2 sessions Baseline:  Goal status: INITIAL  5.  Pt will report improved communication effectiveness via PROM by 2 points at last  ST session  Baseline: V-RQOL=15 Goal status: INITIAL   ASSESSMENT:  CLINICAL IMPRESSION: Patient is a 76 y.o. male who was seen today for Parkinson's Disease. Pt participated in OP ST  intervention targeting dysarthria from August 2022 to January 2023 to address reduced volume and hoarseness. Inconsistent attendance exhibited related to scheduling, but good progress and carryover of trained techniques demonstrated and reported (benefited when wife attended sessions). Today, pt presents with low 70s dB conversational volume with intermittent increasing to persistent hoarseness. 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 Pt continues to practice sustained phonation in car to church and wife cues if volume dropping during sermon. Pt stated "sometimes I'm squeaky" if he talks early in day. Limited awareness or concern for hoarseness exhibited at this time, although, hoarseness would likely impact his speech intelligibility while preaching. Endorsed some mild memory changes but unable to provide specifics. Pt appeared to explain away deficits related to age and wife experiencing similar symptoms. Ongoing drooling exhibited and reported. Pt would benefit from skilled ST intervention to maximize communication effectiveness, optimize vocal quality, and increase functional independence.   OBJECTIVE IMPAIRMENTS  Objective impairments include memory, awareness, and dysarthria. These impairments are limiting patient from household responsibilities and effectively communicating at home and in community. Factors affecting potential to achieve goals and functional outcome are cooperation/participation level and medical prognosis. Patient will benefit from skilled SLP services to address above impairments and improve overall function.  REHAB POTENTIAL: Fair - awareness and independent carryover  PLAN: SLP FREQUENCY: 2x/week recommended but pt requested 1x/week due to transportation and limited availability  SLP DURATION: 12 weeks  PLANNED INTERVENTIONS: Language facilitation, Environmental controls, Cueing hierachy, Cognitive reorganization, Internal/external aids, Oral motor  exercises, Functional tasks, Multimodal communication approach, SLP instruction and feedback, Compensatory strategies, and Patient/family education    Marzetta Board, [place] 11/25/2021, 12:13 PM

## 2021-11-25 NOTE — Patient Instructions (Signed)
SPEAK OUT! is a structured program targeting voice in patient's with Parkinson's. This program was developed by [employer]. It is evidence based and based on principles of motor learning. The nonprofit provides free materials to patients being treated by a Speak Out! certified SLP.   www.parkinsonvoiceproject.org for more information  Learn about Parkinson's webinar: https://parkinsonvoiceproject.org/education-training/monthly-webinar/ -- highly recommend watching this webinar!!  Order your stimulus booklet: [phone] Your provider: Jessica Thomas SLP, [facility]  This book is free!! They do offer a "pay if forward" option where you can donate to the non-profit organization so they can continue serving the Parkinson's population and providing these valuable resources to patients.    

## 2021-11-25 NOTE — Therapy (Signed)
OUTPATIENT PHYSICAL THERAPY NEURO EVALUATION   Patient Name: Alexander Thomas MRN: [id number] DOB:04-26-1945, 76 y.o., male Today's Date: 11/25/2021   PCP: Janie Morning, DO REFERRING PROVIDER: Marcial Pacas, MD    PT End of Session - 11/25/21 1018     Visit Number 1    Number of Visits 9   Plus eval   Date for PT Re-Evaluation 01/27/22    Authorization Type Medicare- part A & B    PT Start Time 1015    PT Stop Time 1057    PT Time Calculation (min) 42 min    Activity Tolerance Patient tolerated treatment well    Behavior During Therapy WFL for tasks assessed/performed             Past Medical History:  Diagnosis Date   Hypercholesteremia    Migraine    Polymyositis ([place])    Raynaud disease    Tremor of right hand    Past Surgical History:  Procedure Laterality Date   CARPAL TUNNEL RELEASE Left    INGUINAL HERNIA REPAIR Bilateral    WRIST SURGERY Right    Patient Active Problem List   Diagnosis Date Noted   Acne 06/10/2021   Elevated PSA 06/10/2021   Osteoarthritis 06/10/2021   Osteoporosis 06/10/2021   Postherpetic neuralgia 06/10/2021   Raynaud's disease 06/10/2021   Rosacea 06/10/2021   Gait abnormality 08/29/2019   Midline low back pain without sciatica 08/29/2019   Mild cognitive impairment 08/29/2019   Excessive daytime sleepiness 01/24/2019   Nocturia more than twice per night 01/24/2019   Hypersomnia with sleep apnea 01/24/2019   At risk for central sleep apnea 01/24/2019   RLS (restless legs syndrome) 01/24/2019   PAF (paroxysmal atrial fibrillation) ([place]) 06/04/2015   Idiopathic Parkinson's disease ([place]) 05/07/2015   Polymyositis ([place]) 05/07/2015    ONSET DATE: 10/27/2021   REFERRING DIAG: G20 (ICD-10-CM) - Idiopathic Parkinson's disease ([place])   THERAPY DIAG:  Unsteadiness on feet  Abnormal posture  History of falling  Other abnormalities of gait and mobility  Rationale for Evaluation and Treatment Rehabilitation  SUBJECTIVE:                                                                                                                                                                                               SUBJECTIVE STATEMENT: "I think I am doing fairly well. I have a little pain in my hip and knee but I watch people walking around with these walkers and such and I think to myself, I am no worse than they are". Pt reports he is walking around his home (~80')  w/rollator several times per day for exercise. Pt has not been performing PWR moves but states he gets in the floor and stretches by himself. "People are surprised when I tell them I am getting on and off the floor by myself". Pt reports he "does not know why he cannot seem to remember his other exercises". States he uses his cane mostly inside his house and the walker for community distances.   Pt accompanied by: self  PERTINENT HISTORY:  hyperlipidemia, Raynaud's disease,, had left carpal tunnel release surgery, right wrist fracture surgery, polymyositis,  left deltoid muscle biopsy consistent with fiber atrophy, perivasculitis in August 2002  PAIN:  Are you having pain? Yes: NPRS scale: 4/10 Pain location: L hip and knee Pain description: Sharp Aggravating factors: "I do not think so" Relieving factors: "takin it easy"  PRECAUTIONS: Fall  WEIGHT BEARING RESTRICTIONS No  FALLS: Has patient fallen in last 6 months? Yes. Number of falls at least 1, slipped out in the yard and landed on elbows a few weeks ago. Pt unable to recall any other falls   LIVING ENVIRONMENT: Lives with: lives with their spouse Lives in: House/apartment Stairs: Yes: Internal: full flight, plus 4 coming in from back entrance steps; can reach both and External: 12 in front and 3-4 in back steps; can reach both Has following equipment at home: Single point cane, Walker - 2 wheeled, Environmental consultant - 4 wheeled, Wheelchair (power), Electronics engineer, and Grab bars  PLOF: Requires assistive device for  independence, Needs assistance with ADLs, and Needs assistance with homemaking  PATIENT GOALS "I wanna get to where I do not have to depend on the cane"   OBJECTIVE:   COGNITION: Overall cognitive status: Difficulty to assess due to: no family present   SENSATION: Pt reports some tingling in his R foot that started several months ago  COORDINATION: Heel to shin test: hypokinetic bilaterally  Finger to nose test: bradykinetic bilaterally w/undershooting (LUE > RUE)   POSTURE: rounded shoulders, forward head, decreased lumbar lordosis, increased thoracic kyphosis, posterior pelvic tilt, and flexed trunk Resting tremor in RUE    LOWER EXTREMITY MMT:  Tested in seated position   MMT Right Eval Left Eval  Hip flexion 4+ 4+  Hip extension    Hip abduction 4+ 4+  Hip adduction 5 5  Hip internal rotation    Hip external rotation    Knee flexion 5 5  Knee extension 5 5  Ankle dorsiflexion 5 5  Ankle plantarflexion    Ankle inversion    Ankle eversion    (Blank rows = not tested)  BED MOBILITY:  Pt denies difficulty w/bed mobility, states he gets up in the middle of the night to use urinal by himself and has handrail on bed to use   TRANSFERS: Assistive device utilized: Environmental consultant - 2 wheeled  Sit to stand: SBA Heavy reliance on BUEs and retropulsion throughout  Stand to sit: SBA min cues to fully turn w/RW prior to sitting in chair   GAIT: Gait pattern: step through pattern, decreased step length- Right, decreased step length- Left, decreased stride length, decreased hip/knee flexion- Right, decreased hip/knee flexion- Left, decreased ankle dorsiflexion- Right, decreased ankle dorsiflexion- Left, Right foot flat, Left foot flat, shuffling, trunk flexed, narrow BOS, poor foot clearance- Right, and poor foot clearance- Left Distance walked: Various clinic distances  Assistive device utilized: Walker - 2 wheeled Level of assistance: SBA and CGA Comments: Increased shuffling  w/turns. Min cues for proper AD management when  turning to sit.   FUNCTIONAL TESTs:   [facility] PT Assessment - 11/25/21 1042       Transfers   Five time sit to stand comments  25.59s w/BUE support on first two reps and no UE support on last 3. Significant retropulsion on reps 1&2      Ambulation/Gait   Gait velocity 32.8' over 15.47s w/RW = 2.12 ft/s      Balance   Balance Assessed Yes      Timed Up and Go Test   Normal TUG (seconds) 30.09   w/RW   Cognitive TUG (seconds) 52.07   w/RW. Retro counting by 1 starting at 67            TODAY'S TREATMENT:  Next Session   PATIENT EDUCATION: Education details: Eval findings, POC, comparison of objective measures today from January of this year  Person educated: Patient Education method: Explanation, Demonstration, and Verbal cues Education comprehension: verbalized understanding and needs further education   HOME EXERCISE PROGRAM: From previous bout of therapy and needs to be reviewed Seated PWR moves, Access Code: [email or web address]    GOALS: Goals reviewed with patient? Yes  SHORT TERM GOALS: Target date: 12/23/2021  Pt will perform initial HEP with min A from wife for improved strength, balance, transfers and gait.  Baseline: need to review HEP from previous bout of therapy  Goal status: INITIAL  2.  Pt will improve normal TUG to less than or equal to 25 seconds w/LRAD for improved functional mobility and decreased fall risk.  Baseline: 30.09s w/RW, significant shuffling w/turns  Goal status: INITIAL  3.  Pt will improve 5 x STS to less than or equal to 20 seconds without UE support to demonstrate improved functional strength and transfer efficiency.   Baseline: 25.59s w/UE support on reps 1&2 due to retropulsion  Goal status: INITIAL  4.  Pt will verbalize fall prevention strategies in the home and community for reduced fall risk  Baseline:  Goal status: INITIAL  5.  Floor transfer will be performed to assess safety at  home w/performing exercises on floor   Baseline:   Goal Status: INITIAL    LONG TERM GOALS: Target date: 01/20/2022  Pt will perform final HEP with min A from wife for improved strength, balance, transfers and gait.  Baseline:  Goal status: INITIAL  2.  Pt will improve cog TUG to less than or equal to 40 seconds w/LRAD for improved functional mobility and decreased fall risk.  Baseline: 52.07s Goal status: INITIAL  3.  Push and release test to be performed and goal written Baseline:  Goal status: INITIAL  4.  Pt will improve gait velocity to at least 2.5 ft/s w/LRAD for improved gait efficiency and safety  Baseline: 2.12 ft/s w/RW  Goal status: INITIAL   ASSESSMENT:  CLINICAL IMPRESSION: Patient is a 76 year old male referred to Neuro OPPT for PD. Pt's PMH is significant for: hyperlipidemia, Raynaud's disease,, had left carpal tunnel release surgery, right wrist fracture surgery, polymyositis,  left deltoid muscle biopsy consistent with fiber atrophy, perivasculitis in August 2002. The following deficits were present during the exam: decreased safety awareness, decreased gait efficiency, postural control deficits, impaired balance and decreased mobility. Based on 5x STS, gait speed and fall history, pt is an incr risk for falls. Pt would benefit from skilled PT to address these impairments and functional limitations to maximize functional mobility independence.    OBJECTIVE IMPAIRMENTS Abnormal gait, decreased activity tolerance, decreased balance, decreased cognition, decreased  coordination, decreased endurance, decreased knowledge of condition, decreased knowledge of use of DME, decreased mobility, difficulty walking, decreased safety awareness, impaired perceived functional ability, and pain.   ACTIVITY LIMITATIONS carrying, lifting, bending, sitting, standing, squatting, stairs, transfers, bed mobility, reach over head, locomotion level, and caring for others  PARTICIPATION  LIMITATIONS: meal prep, cleaning, laundry, medication management, personal finances, interpersonal relationship, driving, shopping, community activity, occupation, yard work, and church  PERSONAL FACTORS Age, Behavior pattern, Fitness, Transportation, and 1 comorbidity: polymyositis  are also affecting patient's functional outcome.   REHAB POTENTIAL: Fair due to impaired cognition, lack of family support and poor compliance   CLINICAL DECISION MAKING: Evolving/moderate complexity  EVALUATION COMPLEXITY: Moderate  PLAN: PT FREQUENCY: 1x/week  PT DURATION: 8 weeks  PLANNED INTERVENTIONS: Therapeutic exercises, Therapeutic activity, Neuromuscular re-education, Balance training, Gait training, Patient/Family education, Self Care, Stair training, DME instructions, Manual therapy, and Re-evaluation  PLAN FOR NEXT SESSION: Review previous HEP, perform push and release testing and update LTG, sit <>stands, scifit for endurance and UE/LE coordination, increased step length, postural control    Monigue Spraggins E Yovanny Coats, PT, DPT 11/25/2021, 10:57 AM

## 2021-12-08 DIAGNOSIS — C4442 Squamous cell carcinoma of skin of scalp and neck: Secondary | ICD-10-CM | POA: Diagnosis not present

## 2021-12-16 ENCOUNTER — Encounter: Payer: Self-pay | Admitting: Occupational Therapy

## 2021-12-16 ENCOUNTER — Encounter: Payer: Self-pay | Admitting: Physical Therapy

## 2021-12-16 ENCOUNTER — Ambulatory Visit: Payer: Medicare Other | Admitting: Physical Therapy

## 2021-12-16 ENCOUNTER — Ambulatory Visit: Payer: Medicare Other | Admitting: Speech Pathology

## 2021-12-16 ENCOUNTER — Ambulatory Visit: Payer: Medicare Other | Admitting: Occupational Therapy

## 2021-12-16 DIAGNOSIS — M25611 Stiffness of right shoulder, not elsewhere classified: Secondary | ICD-10-CM | POA: Diagnosis not present

## 2021-12-16 DIAGNOSIS — M25621 Stiffness of right elbow, not elsewhere classified: Secondary | ICD-10-CM

## 2021-12-16 DIAGNOSIS — R278 Other lack of coordination: Secondary | ICD-10-CM

## 2021-12-16 DIAGNOSIS — R2681 Unsteadiness on feet: Secondary | ICD-10-CM

## 2021-12-16 DIAGNOSIS — R2689 Other abnormalities of gait and mobility: Secondary | ICD-10-CM

## 2021-12-16 DIAGNOSIS — R471 Dysarthria and anarthria: Secondary | ICD-10-CM

## 2021-12-16 DIAGNOSIS — Z9181 History of falling: Secondary | ICD-10-CM

## 2021-12-16 DIAGNOSIS — R41841 Cognitive communication deficit: Secondary | ICD-10-CM

## 2021-12-16 DIAGNOSIS — R29818 Other symptoms and signs involving the nervous system: Secondary | ICD-10-CM

## 2021-12-16 DIAGNOSIS — R29898 Other symptoms and signs involving the musculoskeletal system: Secondary | ICD-10-CM | POA: Diagnosis not present

## 2021-12-16 DIAGNOSIS — R293 Abnormal posture: Secondary | ICD-10-CM

## 2021-12-16 DIAGNOSIS — M25622 Stiffness of left elbow, not elsewhere classified: Secondary | ICD-10-CM

## 2021-12-16 DIAGNOSIS — R251 Tremor, unspecified: Secondary | ICD-10-CM

## 2021-12-16 DIAGNOSIS — M25612 Stiffness of left shoulder, not elsewhere classified: Secondary | ICD-10-CM | POA: Diagnosis not present

## 2021-12-16 NOTE — Patient Instructions (Signed)
PWR! Hand Exercises   Then, start with elbows bent and hands closed:   PWR! Hands: Push hands out BIG. Elbows straight, wrists up, fingers open and spread apart BIG.   (Also can perform PWR! Hands throughout the day when you are having trouble using your hands (picking up/manipulating small objects, writing, eating, typing, sewing, buttoning, etc.).   PWR! Step: Touch index finger to thumb while keeping other fingers straight. Flick fingers out BIG (thumb out/straighten fingers). Repeat with other fingers.   With arms stretched out in front of you (elbows straight), perform the following:   PWR! Rock:  Move wrists up and down [employer]! Twist: Twist palms up and down BIG      ** Make each movement big and deliberate so that you feel the movement.   Perform at least 10 repetitions of each 1x/day.

## 2021-12-16 NOTE — Therapy (Signed)
OUTPATIENT PHYSICAL THERAPY NEURO TREATMENT   Patient Name: Alexander Thomas MRN: [id number] DOB:1945-11-09, 76 y.o., male Today's Date: 12/16/2021   PCP: Janie Morning, DO REFERRING PROVIDER: Marcial Pacas, MD    PT End of Session - 12/16/21 [phone]     Visit Number 2    Number of Visits 9   Plus eval   Date for PT Re-Evaluation 01/27/22    Authorization Type Medicare- part A & B    PT Start Time 0933    PT Stop Time 1014    PT Time Calculation (min) 41 min    Activity Tolerance Patient tolerated treatment well    Behavior During Therapy WFL for tasks assessed/performed             Past Medical History:  Diagnosis Date   Hypercholesteremia    Migraine    Polymyositis ([place])    Raynaud disease    Tremor of right hand    Past Surgical History:  Procedure Laterality Date   CARPAL TUNNEL RELEASE Left    INGUINAL HERNIA REPAIR Bilateral    WRIST SURGERY Right    Patient Active Problem List   Diagnosis Date Noted   Acne 06/10/2021   Elevated PSA 06/10/2021   Osteoarthritis 06/10/2021   Osteoporosis 06/10/2021   Postherpetic neuralgia 06/10/2021   Raynaud's disease 06/10/2021   Rosacea 06/10/2021   Gait abnormality 08/29/2019   Midline low back pain without sciatica 08/29/2019   Mild cognitive impairment 08/29/2019   Excessive daytime sleepiness 01/24/2019   Nocturia more than twice per night 01/24/2019   Hypersomnia with sleep apnea 01/24/2019   At risk for central sleep apnea 01/24/2019   RLS (restless legs syndrome) 01/24/2019   PAF (paroxysmal atrial fibrillation) ([place]) 06/04/2015   Idiopathic Parkinson's disease ([place]) 05/07/2015   Polymyositis ([place]) 05/07/2015    ONSET DATE: 10/27/2021   REFERRING DIAG: G20 (ICD-10-CM) - Idiopathic Parkinson's disease ([place])   THERAPY DIAG:  Unsteadiness on feet  Abnormal posture  History of falling  Other abnormalities of gait and mobility  Rationale for Evaluation and Treatment Rehabilitation  SUBJECTIVE:                                                                                                                                                                                               SUBJECTIVE STATEMENT: No falls since he was last here. Brought in his RW today. No new falls.   Pt accompanied by: self  PERTINENT HISTORY:  hyperlipidemia, Raynaud's disease,, had left carpal tunnel release surgery, right wrist fracture surgery, polymyositis,  left deltoid muscle biopsy consistent with fiber atrophy, perivasculitis  in August 2002  PAIN:  Are you having pain? Yes: NPRS scale: 3-4/10 Pain location: L hip and knee Pain description: Sharp Aggravating factors: "I do not think so" Relieving factors: "takin it easy"  PRECAUTIONS: Fall  PLOF: Requires assistive device for independence, Needs assistance with ADLs, and Needs assistance with homemaking  PATIENT GOALS "I wanna get to where I do not have to depend on the cane"   OBJECTIVE:    TODAY'S TREATMENT:   TRANSFERS: Assistive device utilized: Environmental consultant - 2 wheeled  Sit to stand: SBA  Stand to sit: SBA  Sit <> stands performed throughout session and from review of HEP (2 sets of 5 reps) with focus on proper technique to scoot out towards edge, bring feet back under him, and incr EFFORT when leaning forward to stand. Pt needing a quick boost of his hands to help stand up. Once in standing, cued for posture/scap retraction and extending knees (pt stands with knees flexed).   GAIT: Gait pattern: step through pattern, decreased step length- Right, decreased step length- Left, decreased stride length, decreased hip/knee flexion- Right, decreased hip/knee flexion- Left, decreased ankle dorsiflexion- Right, decreased ankle dorsiflexion- Left, Right foot flat, Left foot flat, shuffling, trunk flexed, narrow BOS, poor foot clearance- Right, and poor foot clearance- Left Distance walked: 115' x 1, plus additional distances.  Assistive device  utilized: Walker - 2 wheeled Level of assistance: SBA and CGA Comments: Increased shuffling noted w/turns. Cued for incr foot clearance. Min cues for proper AD management when turning to sit. Cued for posture, stride length and foot clearance during gait.    Pt brought in his exercise folder from home. Reviewed previous exercises below for HEP as pt had not been performing (pt has just been walking with RW at home).   Pt performs PWR! Moves in seated position:    PWR! Up for improved posture x10 reps, initial demo cues for proper technique   PWR! Rock for improved weighshifting x10 reps, cued to reach across body and for elbow extension   PWR! Step for improved step initiation 2 sets of 10 reps each side, modified and performed as a seated march. During 1st set, pt barely able to lift legs off the floor. With 2nd set cued for working at an 8/10 level of effort and to STOMP his foot on the ground, pt did much better with this.   Cues provided for technique and working up to an 8/10 level of effort for larger amplitude movement patterns at home. Pt initially reporting level of effort as a 4/10.    Access Code: [email or web address] URL: [email or web address] Date: 12/16/2021 Prepared by: Janann August  Sit to Stands 2 sets of 5 reps   Exercises - Alternating Step Backward with Support  - 1-2 x daily - 5 x weekly - 1-2 sets - 10 reps - performed with BUE support at countertop, pt with decr step length with LLE.  - Side to Side Weight Shift with Overhead Reach and Counter Support  - 1-2 x daily - 5 x weekly - 1-2 sets - 10 reps - cued for wider BOS, pt reporting no incr in shoulder pain, just feeling a good stretch.      PATIENT EDUCATION: Education details: Review of previous HEP, sit to stand training, importance of exercise.   Person educated: Patient Education method: Explanation, Demonstration, and Verbal cues Education comprehension: verbalized understanding, verbal cues  required, and needs further education   HOME EXERCISE PROGRAM: Seated PWR moves, Sit to  stands, Access Code: [email or web address]    GOALS: Goals reviewed with patient? Yes  SHORT TERM GOALS: Target date: 12/23/2021  Pt will perform initial HEP with min A from wife for improved strength, balance, transfers and gait.  Baseline: need to review HEP from previous bout of therapy  Goal status: INITIAL  2.  Pt will improve normal TUG to less than or equal to 25 seconds w/LRAD for improved functional mobility and decreased fall risk.  Baseline: 30.09s w/RW, significant shuffling w/turns  Goal status: INITIAL  3.  Pt will improve 5 x STS to less than or equal to 20 seconds without UE support to demonstrate improved functional strength and transfer efficiency.   Baseline: 25.59s w/UE support on reps 1&2 due to retropulsion  Goal status: INITIAL  4.  Pt will verbalize fall prevention strategies in the home and community for reduced fall risk  Baseline:  Goal status: INITIAL  5.  Floor transfer will be performed to assess safety at home w/performing exercises on floor   Baseline:   Goal Status: INITIAL    LONG TERM GOALS: Target date: 01/20/2022  Pt will perform final HEP with min A from wife for improved strength, balance, transfers and gait.  Baseline:  Goal status: INITIAL  2.  Pt will improve cog TUG to less than or equal to 40 seconds w/LRAD for improved functional mobility and decreased fall risk.  Baseline: 52.07s Goal status: INITIAL  3.  Push and release test to be performed and goal written Baseline:  Goal status: INITIAL  4.  Pt will improve gait velocity to at least 2.5 ft/s w/LRAD for improved gait efficiency and safety  Baseline: 2.12 ft/s w/RW  Goal status: INITIAL   ASSESSMENT:  CLINICAL IMPRESSION: Today's skilled session focused on reviewing HEP from previous bout of therapy. Pt had not been performing, just has been walking at home with RW. Pt needing cues for  larger amplitude movement patterns for seated PWR moves. Pt needs cues for sit <> stands for tucking his feet back and incr forward lean to be able to stand on the first attempt and decr retropulsion. Pt will need continued practice. Will continue to progress towards LTGs.    OBJECTIVE IMPAIRMENTS Abnormal gait, decreased activity tolerance, decreased balance, decreased cognition, decreased coordination, decreased endurance, decreased knowledge of condition, decreased knowledge of use of DME, decreased mobility, difficulty walking, decreased safety awareness, impaired perceived functional ability, and pain.   ACTIVITY LIMITATIONS carrying, lifting, bending, sitting, standing, squatting, stairs, transfers, bed mobility, reach over head, locomotion level, and caring for others  PARTICIPATION LIMITATIONS: meal prep, cleaning, laundry, medication management, personal finances, interpersonal relationship, driving, shopping, community activity, occupation, yard work, and church  PERSONAL FACTORS Age, Behavior pattern, Fitness, Transportation, and 1 comorbidity: polymyositis  are also affecting patient's functional outcome.   REHAB POTENTIAL: Fair due to impaired cognition, lack of family support and poor compliance   CLINICAL DECISION MAKING: Evolving/moderate complexity  EVALUATION COMPLEXITY: Moderate  PLAN: PT FREQUENCY: 1x/week  PT DURATION: 8 weeks  PLANNED INTERVENTIONS: Therapeutic exercises, Therapeutic activity, Neuromuscular re-education, Balance training, Gait training, Patient/Family education, Self Care, Stair training, DME instructions, Manual therapy, and Re-evaluation  PLAN FOR NEXT SESSION: Perform push and release testing and update LTG, sit <>stands, scifit for endurance and UE/LE coordination, increased step length, postural control    Kordelia Severin N Naveen Lorusso, PT, DPT 12/16/2021, 11:19 AM

## 2021-12-16 NOTE — Patient Instructions (Addendum)
Bring your phone with you to next session.   Lesson 2 in [employer]  _____  [employer] 3 in [employer]  _____  Kimberly[employer]ark 4 in [employer] workbook _____

## 2021-12-16 NOTE — Therapy (Signed)
OUTPATIENT OCCUPATIONAL THERAPY PARKINSON'S EVALUATION  Patient Name: Alexander Thomas MRN: [id number] DOB:1946-01-31, 76 y.o., male Today's Date: 12/16/2021  PCP: Janie Morning, DO REFERRING PROVIDER: Dr. Marcial Pacas   OT End of Session - 12/16/21 1111     Visit Number 2    Number of Visits 13    Date for OT Re-Evaluation 02/23/22    Authorization Type Medicare & BCBS (BCBS will cover remaining from [facility] Medicare guidelines    Authorization - Visit Number 2    Authorization - Number of Visits 10    Progress Note Due on Visit 10    OT Start Time 1107    OT Stop Time 1145    OT Time Calculation (min) 38 min    Activity Tolerance Patient tolerated treatment well    Behavior During Therapy WFL for tasks assessed/performed              Past Medical History:  Diagnosis Date   Hypercholesteremia    Migraine    Polymyositis ([place])    Raynaud disease    Tremor of right hand    Past Surgical History:  Procedure Laterality Date   CARPAL TUNNEL RELEASE Left    INGUINAL HERNIA REPAIR Bilateral    WRIST SURGERY Right    Patient Active Problem List   Diagnosis Date Noted   Acne 06/10/2021   Elevated PSA 06/10/2021   Osteoarthritis 06/10/2021   Osteoporosis 06/10/2021   Postherpetic neuralgia 06/10/2021   Raynaud's disease 06/10/2021   Rosacea 06/10/2021   Gait abnormality 08/29/2019   Midline low back pain without sciatica 08/29/2019   Mild cognitive impairment 08/29/2019   Excessive daytime sleepiness 01/24/2019   Nocturia more than twice per night 01/24/2019   Hypersomnia with sleep apnea 01/24/2019   At risk for central sleep apnea 01/24/2019   RLS (restless legs syndrome) 01/24/2019   PAF (paroxysmal atrial fibrillation) ([place]) 06/04/2015   Idiopathic Parkinson's disease ([place]) 05/07/2015   Polymyositis ([place]) 05/07/2015    ONSET DATE: 10/27/21 (MD referral)  REFERRING DIAG: Parkinson's Disease  THERAPY DIAG:  Other symptoms and signs involving the  musculoskeletal system  Abnormal posture  Unsteadiness on feet  Other symptoms and signs involving the nervous system  Stiffness of left elbow, not elsewhere classified  Tremor  Stiffness of right elbow, not elsewhere classified  Other lack of coordination  Stiffness of left shoulder, not elsewhere classified  Stiffness of right shoulder, not elsewhere classified  Other abnormalities of gait and mobility  Rationale for Evaluation and Treatment Rehabilitation  SUBJECTIVE:   SUBJECTIVE STATEMENT: Pt reports only taking sinemet 2x day.    Pt accompanied by: self  PERTINENT HISTORY: Parkinson's Disease   PMH:  hx of polymyositis, hx of orthostatic tachycardia, memory, Raynaud's disease, L CTR, R wrist fx surgery, L hip fracture 2/22 from fall, hx of L elbow fx due to fall 2/22 (surgery x2), sleep apnea, restless leg syndrome, hx of back pain, mild cognitive impairment.  PRECAUTIONS: Fall  WEIGHT BEARING RESTRICTIONS No  PAIN:  Are you having pain? Yes: NPRS scale: 4/10 Pain location: L hip, sometimes knee Pain description: sharp-knee, dull sometimes sharp-hip Aggravating factors: unknown Relieving factors: sitting  FALLS: Has patient fallen in last 6 months? Yes. Number of falls "slipped a time or 2 in the yard"--able to to get up himself  LIVING ENVIRONMENT: Lives with: lives with their family and lives with their spouse Lives in: House/apartment Has following equipment at home: Single point cane, Environmental consultant - 2 wheeled, [employer]  bench, and Grab bars  PLOF: Independent, Vocation/Vocational requirements: retired, but continues preach on Sundays for [employer], and Leisure: has enjoyed Medical illustrator, gardening, carpentry  PATIENT GOALS  improve movement  HAND DOMINANCE: Right  OBJECTIVE:     FUNCTIONAL OUTCOME MEASURES: 12/16/21: Standing functional reach: R-4", L-7"   TODAY'S TREATMENT:   Practiced sit>stand and ambulation in functional  context with min cueing for large amplitude movements  Assessed Standing Functional reach--see above   Began review of coordination HEP: flipping cards and dealing cards with thumb with each hand with min difficulty/cueing L hand and mod difficulty/cueing with R hand   PATIENT EDUCATION: Education: PWR! Hands (basic 4).  Reviewed timing of medication and importance of taking PD meds (Sinemet) 3x/day as directed.  Also recommended that pt discuss red bumps/rash on arms with PCP.  Recommended pt discuss drooling with ST and neurologist.  Pt instructed/reviewed education regarding  Person educated: Patient Education method: Explanation, Demonstration, Tactile cues, Verbal cues, and Handouts Education comprehension: verbalized understanding, returned demonstration, verbal cues required, tactile cues required, and needs further education   HOME EXERCISE PROGRAM: 12/16/21:  PWR! Hands (basic 4)  ASSESSMENT:  CLINICAL IMPRESSION: Pt responds well to cueing for large amplitude movements, but needs repetition and cueing for incr carryover.      PERFORMANCE DEFICITS in functional skills including ADLs, IADLs, coordination, dexterity, tone, ROM, FMC, GMC, mobility, balance, endurance, decreased knowledge of precautions, decreased knowledge of use of DME, and UE functional use, cognitive skills including memory, and psychosocial skills including environmental adaptation and habits.   IMPAIRMENTS are limiting patient from ADLs, IADLs, work, and leisure.   COMORBIDITIES may have co-morbidities  that affects occupational performance. Patient will benefit from skilled OT to address above impairments and improve overall function.  MODIFICATION OR ASSISTANCE TO COMPLETE EVALUATION: Min-Moderate modification of tasks or assist with assess necessary to complete an evaluation.  OT OCCUPATIONAL PROFILE AND HISTORY: Detailed assessment: Review of records and additional review of physical, cognitive,  psychosocial history related to current functional performance.  CLINICAL DECISION MAKING: Moderate - several treatment options, min-mod task modification necessary  REHAB POTENTIAL: Good  EVALUATION COMPLEXITY: Moderate    GOALS: Potential Goals reviewed with patient? Yes  SHORT TERM GOALS: Target date: 12/23/2021   Pt will be independent with updated PD-specific HEP. Goal status: INITIAL  2.  Pt will verbalize understanding of ways to decr risk of complications related to PD. Goal status: INITIAL  3.  Pt will improve RUE functional reaching and coordination for ADLs as shown by improving score on box and blocks test by at least 3 with RUE. Baseline:  30 blocks Goal status: INITIAL  4.  Pt will improve RUE functional reaching and coordination for ADLs as shown by improving score on box and blocks test by at least 3 with LUE. Baseline:  23 blocks Goal status: INITIAL  5.  Pt will demo at least 120* shoulder flex with at least -40* elbow ext with RUE for overhead reach. Baseline:  120* with -50* elbow ext Goal status: INITIAL   LONG TERM GOALS: Target date: 11/6//2023     Pt will verbalize understanding of adaptive strategies/AE to incr safety/ease with ADLs/IADLs. Goal status: INITIAL  2.  Pt will improve RUE functional reaching and coordination for ADLs as shown by improving time on 9-hole peg test by at least 3sec with RUE. Baseline:  44.31sec Goal status: INITIAL  3.  Pt will improve bilatateral coordination and incr ease with dressing as  shown by fastening/unfastening 3 buttons in less than 60sec. Baseline: 72.32sec Goal status: INITIAL  4.  Assess standing functional reach and establish goal as appropriate Goal status: INITIAL  5.  Assess PPT#4 (donning/doffing jacket) and establish goal as appropriate.   Goal status: INITIAL   PLAN: OT FREQUENCY: 1x/week at pt request  OT DURATION: 12 weeks +eval (may d/c after 8 weeks)  PLANNED INTERVENTIONS: self  care/ADL training, therapeutic exercise, therapeutic activity, neuromuscular re-education, passive range of motion, gait training, balance training, functional mobility training, ultrasound, moist heat, cryotherapy, patient/family education, cognitive remediation/compensation, and DME and/or AE instructions  RECOMMENDED OTHER SERVICES: current with PT, ST  CONSULTED AND AGREED WITH PLAN OF CARE: Patient  PLAN FOR NEXT SESSION:  check PPT#4 and establish goal as appropriate; continue reviewing coordination HEP   Karman Veney, OTR/L 12/16/2021, 11:39 AM

## 2021-12-16 NOTE — Therapy (Signed)
OUTPATIENT SPEECH LANGUAGE PATHOLOGY TREATMENT   Patient Name: Alexander Thomas MRN: [id number] DOB:[birth year], 76 y.o., male Today's Date: 12/16/2021  PCP: Janie Morning, DO REFERRING PROVIDER: Marcial Pacas, MD    End of Session - 12/16/21 1020     Visit Number 2    Number of Visits 13    Date for SLP Re-Evaluation 02/20/22    Authorization Type Medicare    SLP Start Time 1020    SLP Stop Time  1059    SLP Time Calculation (min) 39 min    Activity Tolerance Patient tolerated treatment well              Past Medical History:  Diagnosis Date   Hypercholesteremia    Migraine    Polymyositis ([place])    Raynaud disease    Tremor of right hand    Past Surgical History:  Procedure Laterality Date   CARPAL TUNNEL RELEASE Left    INGUINAL HERNIA REPAIR Bilateral    WRIST SURGERY Right    Patient Active Problem List   Diagnosis Date Noted   Acne 06/10/2021   Elevated PSA 06/10/2021   Osteoarthritis 06/10/2021   Osteoporosis 06/10/2021   Postherpetic neuralgia 06/10/2021   Raynaud's disease 06/10/2021   Rosacea 06/10/2021   Gait abnormality 08/29/2019   Midline low back pain without sciatica 08/29/2019   Mild cognitive impairment 08/29/2019   Excessive daytime sleepiness 01/24/2019   Nocturia more than twice per night 01/24/2019   Hypersomnia with sleep apnea 01/24/2019   At risk for central sleep apnea 01/24/2019   RLS (restless legs syndrome) 01/24/2019   PAF (paroxysmal atrial fibrillation) ([place]) 06/04/2015   Idiopathic Parkinson's disease ([place]) 05/07/2015   Polymyositis ([place]) 05/07/2015    ONSET DATE: PD dx 2016; 10/27/2021 (referral date)  REFERRING DIAG: G20 (ICD-10-CM) - Idiopathic Parkinson's disease   THERAPY DIAG:  Cognitive communication deficit  Dysarthria and anarthria  Rationale for Evaluation and Treatment Rehabilitation  SUBJECTIVE:   SUBJECTIVE STATEMENT: Pt reports to having ordered Speak Out workbook, has it at home  Pt accompanied  by: self   PAIN:  Are you having pain? Yes: NPRS scale: 4-5/10 Pain location: L hip Pain description: dull ache Aggravating factors: none Relieving factors: resting   OBJECTIVE:    TODAY'S TREATMENT:  12-16-21: Pt enters with sub-optimal volume and usual hoarseness. ID key word which will assist pt in using intent  Target improving vocal quality and increasing intensity through progressively difficulty speech tasks using Speak Out! program, lesson 1. ST leads pt through exercises providing usual model prior to pt execution. Usual mod-A required to achieve target dB this date. Averages this date: loud "ah" 91 dB; reading (phrases) 78 dB; cognitive speech task 74 dB. Pt's hoarseness noted to clear with use of intentional speech. Conversational sample of approx 10 minutes, pt averages 67 dB with usual max-A, to include visual and verbal cueing and recasting. Difficulty using intent when attempting to organize thoughts or recall details in story retell.   11-25-21: Discussed any possible changes in communication, cognition, and swallow function. Pt reports some minimal changes in voice and memory since last course of ST intervention, although hoarseness notably worse (conversational volume was sufficient today). SLP educated patient on recommendation to address vocal quality and possibly memory changes, in which pt agreeable. SLP assisted with ordering Speak Out! Workbook today.    PATIENT EDUCATION: Education details: see above Person educated: Patient Education method: Customer service manager Education comprehension: verbalized understanding, returned demonstration, and needs further  education   HOME EXERCISE PROGRAM: Speak Out! Program   GOALS: Goals reviewed with patient? Yes  SHORT TERM GOALS: Target date: 12/23/2021    Pt will complete Speak Out! HEP at least 1x/day given occasional min A over 2 sessions  Baseline: Goal status: IN PROGRESS  2.  Pt will achieve targeted dB  level and clear vocal quality in demonstration of Speak Out! Lessons with 80% accuracy given occasional mod A over 2 sessions Baseline:  Goal status: IN PROGRESS  3.  Pt will utilize dysarthria compensations to optimize vocal intensity and clarity in 5-10 minute conversation given occasional mod A over 2 sessions Baseline:  Goal status: IN PROGRESS  4. Pt will utilize cognitive compensations to aid recall of pertinent information (as needed) given occasional mod A over 2 sessions Baseline:  Goal status: IN PROGRESS  5.  Pt will implement saliva management strategies to reduce drooling given occasional mod A over 2 sessions  Baseline:  Goal status: IN PROGRESS   LONG TERM GOALS: Target date: 02/20/2022  Pt will complete Speak Out! HEP at least 1x/day (BID recommended) over 1 week  Baseline:  Goal status: IN PROGRESS  2.  Pt will achieve targeted dB levels and clear vocal quality in demonstration of Speak Out! Lessons with 90% accuracy given occasional min A over 2 sessions Baseline:  Goal status: IN PROGRESS  3.  Pt will utilize dysarthria compensations to optimize vocal intensity and clarity in 20+ minute conversation given occasional min A over 2 sessions Baseline:  Goal status: IN PROGRESS  4.  Pt will utilize cognitive compensations to aid recall of pertinent information (as needed) given occasional min A over 2 sessions Baseline:  Goal status: IN PROGRESS  5.  Pt will report improved communication effectiveness via PROM by 2 points at last ST session  Baseline: V-RQOL=15 Goal status: IN PROGRESS   ASSESSMENT:  CLINICAL IMPRESSION: Patient is a 76 y.o. male who was seen today for Parkinson's Disease. Pt participated in OP ST intervention targeting dysarthria from August 2022 to January 2023 to address reduced volume and hoarseness. Inconsistent attendance exhibited related to scheduling, but good progress and carryover of trained techniques demonstrated and reported  (benefited when wife attended sessions). Today, pt presents with low 70s dB conversational volume with intermittent increasing to persistent hoarseness. 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 Pt continues to practice sustained phonation in car to church and wife cues if volume dropping during sermon. Pt stated "sometimes I'm squeaky" if he talks early in day. Limited awareness or concern for hoarseness exhibited at this time, although, hoarseness would likely impact his speech intelligibility while preaching. Endorsed some mild memory changes but unable to provide specifics. Pt appeared to explain away deficits related to age and wife experiencing similar symptoms. Ongoing drooling exhibited and reported. Pt would benefit from skilled ST intervention to maximize communication effectiveness, optimize vocal quality, and increase functional independence.   OBJECTIVE IMPAIRMENTS  Objective impairments include memory, awareness, and dysarthria. These impairments are limiting patient from household responsibilities and effectively communicating at home and in community. Factors affecting potential to achieve goals and functional outcome are cooperation/participation level and medical prognosis. Patient will benefit from skilled SLP services to address above impairments and improve overall function.  REHAB POTENTIAL: Fair - awareness and independent carryover  PLAN: SLP FREQUENCY: 2x/week recommended but pt requested 1x/week due to transportation and limited availability  SLP DURATION: 12 weeks  PLANNED INTERVENTIONS: Language facilitation, Environmental controls, Cueing hierachy,  Cognitive reorganization, Internal/external aids, Oral motor exercises, Functional tasks, Multimodal communication approach, SLP instruction and feedback, Compensatory strategies, and Patient/family education    Su Monks, CCC-SLP 12/16/2021, 10:20 AM

## 2021-12-28 NOTE — Therapy (Signed)
OUTPATIENT OCCUPATIONAL THERAPY PARKINSON'S EVALUATION  Patient Name: Alexander Thomas MRN: [id number] DOB:October 21, 1945, 76 y.o., male Today's Date: 12/29/2021  PCP: Janie Morning, DO REFERRING PROVIDER: Dr. Marcial Pacas   OT End of Session - 12/29/21 1104     Visit Number 3    Number of Visits 13    Date for OT Re-Evaluation 02/23/22    Authorization Type Medicare & BCBS (BCBS will cover remaining from [facility] Medicare guidelines    Authorization - Visit Number 3    Authorization - Number of Visits 10    Progress Note Due on Visit 10    OT Start Time 1105    OT Stop Time 1145    OT Time Calculation (min) 40 min    Activity Tolerance Patient tolerated treatment well    Behavior During Therapy WFL for tasks assessed/performed               Past Medical History:  Diagnosis Date   Hypercholesteremia    Migraine    Polymyositis ([place])    Raynaud disease    Tremor of right hand    Past Surgical History:  Procedure Laterality Date   CARPAL TUNNEL RELEASE Left    INGUINAL HERNIA REPAIR Bilateral    WRIST SURGERY Right    Patient Active Problem List   Diagnosis Date Noted   Acne 06/10/2021   Elevated PSA 06/10/2021   Osteoarthritis 06/10/2021   Osteoporosis 06/10/2021   Postherpetic neuralgia 06/10/2021   Raynaud's disease 06/10/2021   Rosacea 06/10/2021   Gait abnormality 08/29/2019   Midline low back pain without sciatica 08/29/2019   Mild cognitive impairment 08/29/2019   Excessive daytime sleepiness 01/24/2019   Nocturia more than twice per night 01/24/2019   Hypersomnia with sleep apnea 01/24/2019   At risk for central sleep apnea 01/24/2019   RLS (restless legs syndrome) 01/24/2019   PAF (paroxysmal atrial fibrillation) ([place]) 06/04/2015   Idiopathic Parkinson's disease ([place]) 05/07/2015   Polymyositis ([place]) 05/07/2015    ONSET DATE: 10/27/21 (MD referral)  REFERRING DIAG: Parkinson's Disease  THERAPY DIAG:  Other symptoms and signs involving the  musculoskeletal system  Other symptoms and signs involving the nervous system  Stiffness of left elbow, not elsewhere classified  Unsteadiness on feet  Abnormal posture  Tremor  Stiffness of right elbow, not elsewhere classified  Other lack of coordination  Stiffness of left shoulder, not elsewhere classified  Stiffness of right shoulder, not elsewhere classified  Attention and concentration deficit  Rationale for Evaluation and Treatment Rehabilitation  SUBJECTIVE:   SUBJECTIVE STATEMENT: "Doing ok"     Pt accompanied by: self  PERTINENT HISTORY: Parkinson's Disease   PMH:  hx of polymyositis, hx of orthostatic tachycardia, memory, Raynaud's disease, L CTR, R wrist fx surgery, L hip fracture 2/22 from fall, hx of L elbow fx due to fall 2/22 (surgery x2), sleep apnea, restless leg syndrome, hx of back pain, mild cognitive impairment.  PRECAUTIONS: Fall  WEIGHT BEARING RESTRICTIONS No  PAIN:  Are you having pain? Yes: NPRS scale: 3/10 Pain location: L hip, sometimes knee Pain description: sharp-knee, dull sometimes sharp-hip Aggravating factors: unknown Relieving factors: sitting  FALLS: Has patient fallen in last 6 months? Yes. Number of falls "slipped a time or 2 in the yard"--able to to get up himself  LIVING ENVIRONMENT: Lives with: lives with their family and lives with their spouse Lives in: House/apartment Has following equipment at home: Single point cane, Environmental consultant - 2 wheeled, Electronics engineer, and Grab bars  PLOF: Independent, Vocation/Vocational requirements: retired, but continues preach on Sundays for [employer], and Leisure: has enjoyed Medical illustrator, gardening, carpentry  PATIENT GOALS  improve movement  HAND DOMINANCE: Right  OBJECTIVE:   FUNCTIONAL OUTCOME MEASURES: 12/16/21: Standing functional reach: R-4", L-7"   TODAY'S TREATMENT:   Organized therapy folder in new folder with prongs, created dividers for PT/OT/ST, put  checklists  in front, and removed duplicates and discussed use.    Practiced sit>stand and ambulation in functional context with min cueing for large amplitude movements  Continued review of coordination HEP:  rotating ball in each hand (each direction), Stacking coins, and then manipulating in-hand to place in coin bank, min cueing for large amplitude  Recommended walker bag/tray.     PATIENT EDUCATION: Education: Reviewed supine closed-chain shoulder flex and chest press  Person educated: Patient Education method: Explanation, Demonstration, Tactile cues, Verbal cues, and Handouts Education comprehension: verbalized understanding, returned demonstration, verbal cues required, tactile cues required, and needs further education   HOME EXERCISE PROGRAM: 12/16/21:  PWR! Hands (basic 4) 12/29/21:  Review of prior Coordination HEP and supine closed-chain shoulder flex and chest press.  Organized therapy folder, recommended walker bag/tray  ASSESSMENT:  CLINICAL IMPRESSION: Pt is progressing slowly towards goals, but needs cueing and repetition for larger amplitude movements.   PERFORMANCE DEFICITS in functional skills including ADLs, IADLs, coordination, dexterity, tone, ROM, FMC, GMC, mobility, balance, endurance, decreased knowledge of precautions, decreased knowledge of use of DME, and UE functional use, cognitive skills including memory, and psychosocial skills including environmental adaptation and habits.   IMPAIRMENTS are limiting patient from ADLs, IADLs, work, and leisure.   COMORBIDITIES may have co-morbidities  that affects occupational performance. Patient will benefit from skilled OT to address above impairments and improve overall function.  MODIFICATION OR ASSISTANCE TO COMPLETE EVALUATION: Min-Moderate modification of tasks or assist with assess necessary to complete an evaluation.  OT OCCUPATIONAL PROFILE AND HISTORY: Detailed assessment: Review of records and additional  review of physical, cognitive, psychosocial history related to current functional performance.  CLINICAL DECISION MAKING: Moderate - several treatment options, min-mod task modification necessary  REHAB POTENTIAL: Good  EVALUATION COMPLEXITY: Moderate    GOALS: Potential Goals reviewed with patient? Yes  SHORT TERM GOALS: Target date: 12/23/2021   Pt will be independent with updated PD-specific HEP. Goal status: INITIAL  2.  Pt will verbalize understanding of ways to decr risk of complications related to PD. Goal status: INITIAL  3.  Pt will improve RUE functional reaching and coordination for ADLs as shown by improving score on box and blocks test by at least 3 with RUE. Baseline:  30 blocks Goal status: INITIAL  4.  Pt will improve RUE functional reaching and coordination for ADLs as shown by improving score on box and blocks test by at least 3 with LUE. Baseline:  23 blocks Goal status: INITIAL  5.  Pt will demo at least 120* shoulder flex with at least -40* elbow ext with RUE for overhead reach. Baseline:  120* with -50* elbow ext Goal status: INITIAL   LONG TERM GOALS: Target date: 11/6//2023     Pt will verbalize understanding of adaptive strategies/AE to incr safety/ease with ADLs/IADLs. Goal status: INITIAL  2.  Pt will improve RUE functional reaching and coordination for ADLs as shown by improving time on 9-hole peg test by at least 3sec with RUE. Baseline:  44.31sec Goal status: INITIAL  3.  Pt will improve bilatateral coordination and incr ease with dressing  as shown by fastening/unfastening 3 buttons in less than 60sec. Baseline: 72.32sec Goal status: INITIAL  4.  Pt will improve standing functional reach by at least 2 inches with RUE.  Baseline:  R-4", L-7" Goal status: INITIAL  5.  Assess PPT#4 (donning/doffing jacket) and establish goal as appropriate.   Goal status: INITIAL   PLAN: OT FREQUENCY: 1x/week at pt request  OT DURATION: 12 weeks +eval  (may d/c after 8 weeks, depending on progress)  PLANNED INTERVENTIONS: self care/ADL training, therapeutic exercise, therapeutic activity, neuromuscular re-education, passive range of motion, gait training, balance training, functional mobility training, ultrasound, moist heat, cryotherapy, patient/family education, cognitive remediation/compensation, and DME and/or AE instructions  RECOMMENDED OTHER SERVICES: current with PT, ST  CONSULTED AND AGREED WITH PLAN OF CARE: Patient  PLAN FOR NEXT SESSION:  check PPT#4 and establish goal as appropriate; review PWR! Moves (sitting/supine) and ?in modified quadruped, buttons, functional reach   [facility], OTR/L 12/29/2021, 11:04 AM

## 2021-12-29 ENCOUNTER — Encounter: Payer: Self-pay | Admitting: Occupational Therapy

## 2021-12-29 ENCOUNTER — Ambulatory Visit: Payer: Medicare Other | Admitting: Occupational Therapy

## 2021-12-29 ENCOUNTER — Ambulatory Visit: Payer: Medicare Other | Attending: Family Medicine | Admitting: Physical Therapy

## 2021-12-29 ENCOUNTER — Ambulatory Visit: Payer: Medicare Other | Admitting: Speech Pathology

## 2021-12-29 DIAGNOSIS — R29818 Other symptoms and signs involving the nervous system: Secondary | ICD-10-CM

## 2021-12-29 DIAGNOSIS — R41841 Cognitive communication deficit: Secondary | ICD-10-CM | POA: Insufficient documentation

## 2021-12-29 DIAGNOSIS — M25622 Stiffness of left elbow, not elsewhere classified: Secondary | ICD-10-CM | POA: Diagnosis not present

## 2021-12-29 DIAGNOSIS — R29898 Other symptoms and signs involving the musculoskeletal system: Secondary | ICD-10-CM | POA: Diagnosis not present

## 2021-12-29 DIAGNOSIS — R278 Other lack of coordination: Secondary | ICD-10-CM

## 2021-12-29 DIAGNOSIS — R2689 Other abnormalities of gait and mobility: Secondary | ICD-10-CM | POA: Diagnosis not present

## 2021-12-29 DIAGNOSIS — R293 Abnormal posture: Secondary | ICD-10-CM | POA: Insufficient documentation

## 2021-12-29 DIAGNOSIS — R251 Tremor, unspecified: Secondary | ICD-10-CM

## 2021-12-29 DIAGNOSIS — R41840 Attention and concentration deficit: Secondary | ICD-10-CM

## 2021-12-29 DIAGNOSIS — M25621 Stiffness of right elbow, not elsewhere classified: Secondary | ICD-10-CM | POA: Insufficient documentation

## 2021-12-29 DIAGNOSIS — R471 Dysarthria and anarthria: Secondary | ICD-10-CM | POA: Insufficient documentation

## 2021-12-29 DIAGNOSIS — R2681 Unsteadiness on feet: Secondary | ICD-10-CM | POA: Insufficient documentation

## 2021-12-29 DIAGNOSIS — M25611 Stiffness of right shoulder, not elsewhere classified: Secondary | ICD-10-CM | POA: Diagnosis not present

## 2021-12-29 DIAGNOSIS — M25612 Stiffness of left shoulder, not elsewhere classified: Secondary | ICD-10-CM

## 2021-12-29 NOTE — Therapy (Signed)
OUTPATIENT PHYSICAL THERAPY NEURO TREATMENT   Patient Name: Alexander Thomas MRN: [id number] DOB:01/21/1946, 76 y.o., male Today's Date: 12/29/2021   PCP: Janie Morning, DO REFERRING PROVIDER: Marcial Pacas, MD    PT End of Session - 12/29/21 0936     Visit Number 3    Number of Visits 9   Plus eval   Date for PT Re-Evaluation 01/27/22    Authorization Type Medicare- part A & B    PT Start Time 0932    PT Stop Time 1019    PT Time Calculation (min) 47 min    Equipment Utilized During Treatment Gait belt    Activity Tolerance Patient tolerated treatment well    Behavior During Therapy WFL for tasks assessed/performed;Impulsive              Past Medical History:  Diagnosis Date   Hypercholesteremia    Migraine    Polymyositis ([place])    Raynaud disease    Tremor of right hand    Past Surgical History:  Procedure Laterality Date   CARPAL TUNNEL RELEASE Left    INGUINAL HERNIA REPAIR Bilateral    WRIST SURGERY Right    Patient Active Problem List   Diagnosis Date Noted   Acne 06/10/2021   Elevated PSA 06/10/2021   Osteoarthritis 06/10/2021   Osteoporosis 06/10/2021   Postherpetic neuralgia 06/10/2021   Raynaud's disease 06/10/2021   Rosacea 06/10/2021   Gait abnormality 08/29/2019   Midline low back pain without sciatica 08/29/2019   Mild cognitive impairment 08/29/2019   Excessive daytime sleepiness 01/24/2019   Nocturia more than twice per night 01/24/2019   Hypersomnia with sleep apnea 01/24/2019   At risk for central sleep apnea 01/24/2019   RLS (restless legs syndrome) 01/24/2019   PAF (paroxysmal atrial fibrillation) ([place]) 06/04/2015   Idiopathic Parkinson's disease ([place]) 05/07/2015   Polymyositis ([place]) 05/07/2015    ONSET DATE: 10/27/2021   REFERRING DIAG: G20 (ICD-10-CM) - Idiopathic Parkinson's disease ([place])   THERAPY DIAG:  Abnormal posture  Unsteadiness on feet  Other abnormalities of gait and mobility  Rationale for Evaluation and  Treatment Rehabilitation  SUBJECTIVE:                                                                                                                                                                                              SUBJECTIVE STATEMENT: Pt reports he is doing "better than he should". "I can do pretty much any thing I want as long as I think about it". Pt denies falls but reports about 1 near miss per week in which he loses balance laterally to  the L or R side and catches himself on the wall. Has some stitches on lateral aspect of neck on L side from a derm appointment, gets them out later this week.   Pt accompanied by: self  PERTINENT HISTORY:  hyperlipidemia, Raynaud's disease, had left carpal tunnel release surgery, right wrist fracture surgery, polymyositis,  left deltoid muscle biopsy consistent with fiber atrophy, perivasculitis in August 2002  PAIN:  Are you having pain? Yes: NPRS scale: 3/10 Pain location: L hip and knee Pain description: Sharp Aggravating factors: "I do not think so" Relieving factors: "takin it easy"  PRECAUTIONS: Fall  PLOF: Requires assistive device for independence, Needs assistance with ADLs, and Needs assistance with homemaking  PATIENT GOALS "I wanna get to where I do not have to depend on the cane"   OBJECTIVE:    TODAY'S TREATMENT:   Ther Act   STG assessment   [facility] PT Assessment - 12/29/21 0946       Transfers   Five time sit to stand comments  22.07s w/BUE support on rep 1. No retropulsion noted      Timed Up and Go Test   Normal TUG (seconds) 37.5   w/RW. Significant freezing w/turns and pt requiring min verbal cues to perform task           Lengthy discussion regarding fall prevention at home, as pt states he is still getting into floor to perform exercises. Pt states his wife has several throw rugs that he frequently slips on ("they move like crazy") and will not part with because "she likes how they look". Emphasized  how pt's safety needs to be the priority in the home and removal of throw rugs is reducing his fall risk at home, in which pt verbalized understanding. Encouraged pt to avoid walking on throw rugs if he will not remove them, in which pt stated he would try to avoid them if he can remember. Pt states he tries to use the RW at all times but frequently forgets it, requiring his wife to cue him.  Pt demonstrated floor transfer to red mat so therapist can determine safety at home. Pt reports he performs floor transfer in open space close to brick mantle at home. Pt had significant difficulty coming to stand from floor and required min A to prevent anterior LOB due to improper body mechanics. Strongly encouraged pt to not perform exercises on the floor due to safety concerns and informed pt that therapist can modify the exercises he performs on the floor to a seated position or on a bed. Pt unable to tell therapist which exercises he performs on floor, so therapist unable to provide alternatives at this time. Pt verbalized understanding that floor transfers are not safe for him to perform, but stated "I am hard-headed and if I can do it, I will"  Ther Ex  SciFit multi-peaks level 5 for 8 minutes using BUE/BLEs for neural priming for reciprocal movement, dynamic cardiovascular conditioning and increased amplitude of stepping. RPE of 3/10 following activity    GAIT: Gait pattern: step through pattern, decreased step length- Right, decreased step length- Left, decreased stride length, decreased hip/knee flexion- Right, decreased hip/knee flexion- Left, decreased ankle dorsiflexion- Right, decreased ankle dorsiflexion- Left, Right foot flat, Left foot flat, shuffling, trunk flexed, narrow BOS, poor foot clearance- Right, and poor foot clearance- Left Distance walked: Various clinic distances.  Assistive device utilized: Walker - 2 wheeled Level of assistance: SBA and CGA Comments: Increased shuffling noted  w/turns.  Cued for incr foot clearance. Min cues for proper AD management when turning to sit. Cued for posture, stride length and foot clearance during gait.    PATIENT EDUCATION: Education details: Goal assessment outcome, strongly discouraged pt to perform floor transfers at home due to safety concerns  Person educated: Patient Education method: Explanation, Demonstration, and Verbal cues Education comprehension: verbalized understanding, verbal cues required, and needs further education   HOME EXERCISE PROGRAM: Seated PWR moves, Sit to stands, Access Code: [email or web address]    GOALS: Goals reviewed with patient? Yes  SHORT TERM GOALS: Target date: 12/23/2021  Pt will perform initial HEP with min A from wife for improved strength, balance, transfers and gait.  Baseline: need to review HEP from previous bout of therapy; pt reports he does his HEP alone  Goal status: IN PROGRESS  2.  Pt will improve normal TUG to less than or equal to 25 seconds w/LRAD for improved functional mobility and decreased fall risk.  Baseline: 30.09s w/RW, significant shuffling w/turns; 37.5s w/RW and increased shuffling w/turns  Goal status: NOT MET  3.  Pt will improve 5 x STS to less than or equal to 20 seconds without UE support to demonstrate improved functional strength and transfer efficiency.   Baseline: 25.59s w/UE support on reps 1&2 due to retropulsion; 22.07s w/BUE support on first rep, no retropulsion noted  Goal status: IN PROGRESS  4.  Pt will verbalize fall prevention strategies in the home and community for reduced fall risk  Baseline:  Goal status: NOT MET  5.  Floor transfer will be performed to assess safety at home w/performing exercises on floor   Baseline:   Goal Status: MET   LONG TERM GOALS: Target date: 01/20/2022  Pt will perform final HEP with min A from wife for improved strength, balance, transfers and gait.  Baseline:  Goal status: INITIAL  2.  Pt will improve cog TUG to  less than or equal to 40 seconds w/LRAD for improved functional mobility and decreased fall risk.  Baseline: 52.07s Goal status: INITIAL  3.  Push and release test to be performed and goal written Baseline:  Goal status: INITIAL  4.  Pt will improve gait velocity to at least 2.5 ft/s w/LRAD for improved gait efficiency and safety  Baseline: 2.12 ft/s w/RW  Goal status: INITIAL   ASSESSMENT:  CLINICAL IMPRESSION: Emphasis of skilled PT session on STG assessment and pt education. Pt has met 1/5 STGs, demonstrating how he performs floor transfers at home. At this time, therapist strongly discouraging pt from getting into floor at home due to safety concerns. Pt verbalized understanding but could benefit from repeated education. Pt did improve his time on 5x STS w/no reropulsion but not to goal level. Pt performed normal TUG slower today compared to eval due to increased difficulty with turns and poor AD management. Pt requires frequent cues for safe management of RW, especially w/turns. Continue POC.    OBJECTIVE IMPAIRMENTS Abnormal gait, decreased activity tolerance, decreased balance, decreased cognition, decreased coordination, decreased endurance, decreased knowledge of condition, decreased knowledge of use of DME, decreased mobility, difficulty walking, decreased safety awareness, impaired perceived functional ability, and pain.   ACTIVITY LIMITATIONS carrying, lifting, bending, sitting, standing, squatting, stairs, transfers, bed mobility, reach over head, locomotion level, and caring for others  PARTICIPATION LIMITATIONS: meal prep, cleaning, laundry, medication management, personal finances, interpersonal relationship, driving, shopping, community activity, occupation, yard work, and church  PERSONAL FACTORS Age, Behavior pattern, Fitness, Transportation, and 1 comorbidity: polymyositis  are also affecting patient's functional outcome.   REHAB POTENTIAL: Fair due to impaired  cognition, lack of family support and poor compliance   CLINICAL DECISION MAKING: Evolving/moderate complexity  EVALUATION COMPLEXITY: Moderate  PLAN: PT FREQUENCY: 1x/week  PT DURATION: 8 weeks  PLANNED INTERVENTIONS: Therapeutic exercises, Therapeutic activity, Neuromuscular re-education, Balance training, Gait training, Patient/Family education, Self Care, Stair training, DME instructions, Manual therapy, and Re-evaluation  PLAN FOR NEXT SESSION: Perform push and release testing and update LTG, pt education regarding safety at home, turns practice, sit <>stands, scifit for endurance and UE/LE coordination, increased step length, postural control. Does pt remember the exercises he does in floor?   Cruzita Lederer Donnalyn Juran, PT, DPT 12/29/2021, 11:15 AM

## 2021-12-29 NOTE — Therapy (Signed)
OUTPATIENT SPEECH LANGUAGE PATHOLOGY TREATMENT   Patient Name: Alexander Thomas MRN: [id number] DOB:Sep 15, 1945, 76 y.o., male Today's Date: 12/29/2021  PCP: Janie Morning, DO REFERRING PROVIDER: Marcial Pacas, MD    End of Session - 12/29/21 1001     Visit Number 3    Number of Visits 13    Date for SLP Re-Evaluation 02/20/22    Authorization Type Medicare    SLP Start Time 1015    SLP Stop Time  1100    SLP Time Calculation (min) 45 min    Activity Tolerance Patient tolerated treatment well               Past Medical History:  Diagnosis Date   Hypercholesteremia    Migraine    Polymyositis ([place])    Raynaud disease    Tremor of right hand    Past Surgical History:  Procedure Laterality Date   CARPAL TUNNEL RELEASE Left    INGUINAL HERNIA REPAIR Bilateral    WRIST SURGERY Right    Patient Active Problem List   Diagnosis Date Noted   Acne 06/10/2021   Elevated PSA 06/10/2021   Osteoarthritis 06/10/2021   Osteoporosis 06/10/2021   Postherpetic neuralgia 06/10/2021   Raynaud's disease 06/10/2021   Rosacea 06/10/2021   Gait abnormality 08/29/2019   Midline low back pain without sciatica 08/29/2019   Mild cognitive impairment 08/29/2019   Excessive daytime sleepiness 01/24/2019   Nocturia more than twice per night 01/24/2019   Hypersomnia with sleep apnea 01/24/2019   At risk for central sleep apnea 01/24/2019   RLS (restless legs syndrome) 01/24/2019   PAF (paroxysmal atrial fibrillation) ([place]) 06/04/2015   Idiopathic Parkinson's disease ([place]) 05/07/2015   Polymyositis ([place]) 05/07/2015    ONSET DATE: PD dx 2016; 10/27/2021 (referral date)  REFERRING DIAG: G20 (ICD-10-CM) - Idiopathic Parkinson's disease   THERAPY DIAG:  Cognitive communication deficit  Dysarthria and anarthria  Rationale for Evaluation and Treatment Rehabilitation  SUBJECTIVE:   SUBJECTIVE STATEMENT: "I'm worn out now"  Pt accompanied by: self   PAIN:  Are you having pain?  Yes: NPRS scale: 3/10 Pain location: L hip   OBJECTIVE:    TODAY'S TREATMENT:  12-29-21: Pt reports wife less frequently requesting for repetition, pt tells SLP "I crank it up a bit." Pt has not been completing HEP, agreeable to begin implementing this date. SLP provide written instructions to aid in completion. SLP leads pt through [place] [facility], providing usual modeling prior to pt execution and usual faded to rare min-A for accuracy in completion. Averages as follows: Loud, sustained "ah": 81 dB Counting: 75 dB Reading (phrases): 76 dB Cognitive exercise: 67 dB Improved volume noted in conversation, averages 70 dB, likely 2/2 cognitive component. Hoarseness present, clears through intentional speech. Usual cues required, to include recasting, for carryover of intentional speech to side comments. As session progresses,  pt self-correcting intermittently. ID using "preacher" voice as strategy to aid in carryover of intentional speech.   12-16-21: Pt enters with sub-optimal volume and usual hoarseness. ID key word which will assist pt in using intent  Target improving vocal quality and increasing intensity through progressively difficulty speech tasks using Speak Out! program, lesson 1. ST leads pt through exercises providing usual model prior to pt execution. Usual mod-A required to achieve target dB this date. Averages this date: loud "ah" 91 dB; reading (phrases) 78 dB; cognitive speech task 74 dB. Pt's hoarseness noted to clear with use of intentional speech. Conversational sample of approx  10 minutes, pt averages 67 dB with usual max-A, to include visual and verbal cueing and recasting. Difficulty using intent when attempting to organize thoughts or recall details in story retell.   11-25-21: Discussed any possible changes in communication, cognition, and swallow function. Pt reports some minimal changes in voice and memory since last course of ST intervention, although hoarseness notably  worse (conversational volume was sufficient today). SLP educated patient on recommendation to address vocal quality and possibly memory changes, in which pt agreeable. SLP assisted with ordering Speak Out! Workbook today.    PATIENT EDUCATION: Education details: see above Person educated: Patient Education method: Customer service manager Education comprehension: verbalized understanding, returned demonstration, and needs further education   HOME EXERCISE PROGRAM: Speak Out! Program   GOALS: Goals reviewed with patient? Yes  SHORT TERM GOALS: Target date: 01/20/2022  (extended d/t scheduling issues resulting in pt only having 1 visit scheduled)  Pt will complete Speak Out! HEP at least 1x/day given occasional min A over 2 sessions  Baseline: Goal status: IN PROGRESS  2.  Pt will achieve targeted dB level and clear vocal quality in demonstration of Speak Out! Lessons with 80% accuracy given occasional mod A over 2 sessions Baseline:  Goal status: IN PROGRESS  3.  Pt will utilize dysarthria compensations to optimize vocal intensity and clarity in 5-10 minute conversation given occasional mod A over 2 sessions Baseline:  Goal status: IN PROGRESS  4. Pt will utilize cognitive compensations to aid recall of pertinent information (as needed) given occasional mod A over 2 sessions Baseline:  Goal status: IN PROGRESS  5.  Pt will implement saliva management strategies to reduce drooling given occasional mod A over 2 sessions  Baseline:  Goal status: IN PROGRESS   LONG TERM GOALS: Target date: 02/20/2022  Pt will complete Speak Out! HEP at least 1x/day (BID recommended) over 1 week  Baseline:  Goal status: IN PROGRESS  2.  Pt will achieve targeted dB levels and clear vocal quality in demonstration of Speak Out! Lessons with 90% accuracy given occasional min A over 2 sessions Baseline:  Goal status: IN PROGRESS  3.  Pt will utilize dysarthria compensations to optimize vocal  intensity and clarity in 20+ minute conversation given occasional min A over 2 sessions Baseline:  Goal status: IN PROGRESS  4.  Pt will utilize cognitive compensations to aid recall of pertinent information (as needed) given occasional min A over 2 sessions Baseline:  Goal status: IN PROGRESS  5.  Pt will report improved communication effectiveness via PROM by 2 points at last ST session  Baseline: V-RQOL=15 Goal status: IN PROGRESS   ASSESSMENT:  CLINICAL IMPRESSION: Patient is a 76 y.o. male who was seen today for Parkinson's Disease. Pt participated in OP ST intervention targeting dysarthria from August 2022 to January 2023 to address reduced volume and hoarseness. Inconsistent attendance exhibited related to scheduling, but good progress and carryover of trained techniques demonstrated and reported (benefited when wife attended sessions). Today, pt presents with low 70s dB conversational volume with intermittent increasing to persistent hoarseness. 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 Pt continues to practice sustained phonation in car to church and wife cues if volume dropping during sermon. Pt stated "sometimes I'm squeaky" if he talks early in day. Limited awareness or concern for hoarseness exhibited at this time, although, hoarseness would likely impact his speech intelligibility while preaching. Endorsed some mild memory changes but unable to provide specifics. Pt appeared to explain  away deficits related to age and wife experiencing similar symptoms. Ongoing drooling exhibited and reported. Pt would benefit from skilled ST intervention to maximize communication effectiveness, optimize vocal quality, and increase functional independence.   OBJECTIVE IMPAIRMENTS  Objective impairments include memory, awareness, and dysarthria. These impairments are limiting patient from household responsibilities and effectively communicating at home and in community. Factors  affecting potential to achieve goals and functional outcome are cooperation/participation level and medical prognosis. Patient will benefit from skilled SLP services to address above impairments and improve overall function.  REHAB POTENTIAL: Fair - awareness and independent carryover  PLAN: SLP FREQUENCY: 2x/week recommended but pt requested 1x/week due to transportation and limited availability  SLP DURATION: 12 weeks  PLANNED INTERVENTIONS: Language facilitation, Environmental controls, Cueing hierachy, Cognitive reorganization, Internal/external aids, Oral motor exercises, Functional tasks, Multimodal communication approach, SLP instruction and feedback, Compensatory strategies, and Patient/family education    Su Monks, [place] 12/29/2021, 10:18 AM

## 2021-12-29 NOTE — Patient Instructions (Addendum)
Lesson AM PM  Tuesday 3    Wednesday 4    Thursday 5    Friday 6    Saturday 7    Sunday 8    Monday 9           PROJECT your voice!  Use your PREACHER voice when talking to others!

## 2021-12-30 DIAGNOSIS — Z79899 Other long term (current) drug therapy: Secondary | ICD-10-CM | POA: Diagnosis not present

## 2021-12-30 DIAGNOSIS — R972 Elevated prostate specific antigen [PSA]: Secondary | ICD-10-CM | POA: Diagnosis not present

## 2021-12-30 DIAGNOSIS — Z8639 Personal history of other endocrine, nutritional and metabolic disease: Secondary | ICD-10-CM | POA: Diagnosis not present

## 2021-12-30 DIAGNOSIS — M3322 Polymyositis with myopathy: Secondary | ICD-10-CM | POA: Diagnosis not present

## 2021-12-30 DIAGNOSIS — M1990 Unspecified osteoarthritis, unspecified site: Secondary | ICD-10-CM | POA: Diagnosis not present

## 2021-12-30 DIAGNOSIS — G20 Parkinson's disease: Secondary | ICD-10-CM | POA: Diagnosis not present

## 2021-12-30 DIAGNOSIS — M7989 Other specified soft tissue disorders: Secondary | ICD-10-CM | POA: Diagnosis not present

## 2021-12-30 DIAGNOSIS — M810 Age-related osteoporosis without current pathological fracture: Secondary | ICD-10-CM | POA: Diagnosis not present

## 2021-12-30 DIAGNOSIS — I878 Other specified disorders of veins: Secondary | ICD-10-CM | POA: Diagnosis not present

## 2021-12-30 DIAGNOSIS — R7309 Other abnormal glucose: Secondary | ICD-10-CM | POA: Diagnosis not present

## 2021-12-30 DIAGNOSIS — Z125 Encounter for screening for malignant neoplasm of prostate: Secondary | ICD-10-CM | POA: Diagnosis not present

## 2021-12-30 DIAGNOSIS — I7300 Raynaud's syndrome without gangrene: Secondary | ICD-10-CM | POA: Diagnosis not present

## 2022-01-05 ENCOUNTER — Encounter: Payer: Medicare Other | Admitting: Speech Pathology

## 2022-01-05 ENCOUNTER — Encounter: Payer: Medicare Other | Admitting: Occupational Therapy

## 2022-01-05 ENCOUNTER — Ambulatory Visit: Payer: Medicare Other | Admitting: Physical Therapy

## 2022-01-06 ENCOUNTER — Ambulatory Visit: Payer: Medicare Other | Admitting: Physical Therapy

## 2022-01-06 ENCOUNTER — Encounter: Payer: Medicare Other | Admitting: Speech Pathology

## 2022-01-13 ENCOUNTER — Ambulatory Visit: Payer: Medicare Other | Admitting: Occupational Therapy

## 2022-01-13 ENCOUNTER — Ambulatory Visit: Payer: Medicare Other

## 2022-01-13 ENCOUNTER — Ambulatory Visit: Payer: Medicare Other | Admitting: Physical Therapy

## 2022-01-13 DIAGNOSIS — R2681 Unsteadiness on feet: Secondary | ICD-10-CM

## 2022-01-13 DIAGNOSIS — R278 Other lack of coordination: Secondary | ICD-10-CM

## 2022-01-13 DIAGNOSIS — R293 Abnormal posture: Secondary | ICD-10-CM | POA: Diagnosis not present

## 2022-01-13 DIAGNOSIS — R2689 Other abnormalities of gait and mobility: Secondary | ICD-10-CM | POA: Diagnosis not present

## 2022-01-13 DIAGNOSIS — R29818 Other symptoms and signs involving the nervous system: Secondary | ICD-10-CM | POA: Diagnosis not present

## 2022-01-13 DIAGNOSIS — R29898 Other symptoms and signs involving the musculoskeletal system: Secondary | ICD-10-CM | POA: Diagnosis not present

## 2022-01-13 NOTE — Therapy (Signed)
OUTPATIENT PHYSICAL THERAPY NEURO TREATMENT   Patient Name: Alexander Thomas MRN: [id number] DOB:Jan 01, 1946, 76 y.o., male Today's Date: 01/13/2022   PCP: Janie Morning, DO REFERRING PROVIDER: Marcial Pacas, MD    PT End of Session - 01/13/22 1452     Visit Number 4    Number of Visits 9   Plus eval   Date for PT Re-Evaluation 01/27/22    Authorization Type Medicare- part A & B    PT Start Time 1450    PT Stop Time 1528    PT Time Calculation (min) 38 min    Equipment Utilized During Treatment Gait belt    Activity Tolerance Patient tolerated treatment well    Behavior During Therapy WFL for tasks assessed/performed;Impulsive               Past Medical History:  Diagnosis Date   Hypercholesteremia    Migraine    Polymyositis ([place])    Raynaud disease    Tremor of right hand    Past Surgical History:  Procedure Laterality Date   CARPAL TUNNEL RELEASE Left    INGUINAL HERNIA REPAIR Bilateral    WRIST SURGERY Right    Patient Active Problem List   Diagnosis Date Noted   Acne 06/10/2021   Elevated PSA 06/10/2021   Osteoarthritis 06/10/2021   Osteoporosis 06/10/2021   Postherpetic neuralgia 06/10/2021   Raynaud's disease 06/10/2021   Rosacea 06/10/2021   Gait abnormality 08/29/2019   Midline low back pain without sciatica 08/29/2019   Mild cognitive impairment 08/29/2019   Excessive daytime sleepiness 01/24/2019   Nocturia more than twice per night 01/24/2019   Hypersomnia with sleep apnea 01/24/2019   At risk for central sleep apnea 01/24/2019   RLS (restless legs syndrome) 01/24/2019   PAF (paroxysmal atrial fibrillation) ([place]) 06/04/2015   Idiopathic Parkinson's disease ([place]) 05/07/2015   Polymyositis ([place]) 05/07/2015    ONSET DATE: 10/27/2021   REFERRING DIAG: G20 (ICD-10-CM) - Idiopathic Parkinson's disease ([place])   THERAPY DIAG:  Unsteadiness on feet  Abnormal posture  Other lack of coordination  Other abnormalities of gait and  mobility  Rationale for Evaluation and Treatment Rehabilitation  SUBJECTIVE:                                                                                                                                                                                              SUBJECTIVE STATEMENT: Pt reports he thinks he is "getting a bit better". Pt reports he is still getting onto the floor to do exercises. No new falls   Pt accompanied by: self  PERTINENT HISTORY:  hyperlipidemia, Raynaud's  disease, had left carpal tunnel release surgery, right wrist fracture surgery, polymyositis,  left deltoid muscle biopsy consistent with fiber atrophy, perivasculitis in August 2002  PAIN:  Are you having pain? Yes: NPRS scale: 3/10 Pain location: L hip and knee, L elbow Pain description: Sharp Aggravating factors: "I do not think so" Relieving factors: "takin it easy"  PRECAUTIONS: Fall  PLOF: Requires assistive device for independence, Needs assistance with ADLs, and Needs assistance with homemaking  PATIENT GOALS "I wanna get to where I do not have to depend on the cane"   OBJECTIVE:   TODAY'S TREATMENT:  Self-care/home management  Reiterated safety concerns from previous therapy session, as pt continues to get down onto floor to do exercises without assistance in front of a brick mantle. This therapist has informed pt multiple times that she does not recommend pt get into the floor due to the safety risks associated with it. Of note, pt required min A to perform floor transfer w/therapist last session and pt reports he is performing transfer alone at home. Pt at this time denying therapist's wishes to modify his exercises to be performed in seated position and states he will continue to get onto the floor.   Ther Ex  SciFit multi-peaks level 4 for 8 minutes using BUE/BLEs for neural priming for reciprocal movement, dynamic cardiovascular conditioning and increased amplitude of stepping. Pt required  mod A for proper stand <>sit transfer onto SciFit, as he attempted to sit on seat sideways and did not turn fully prior to attempting to sit. RPE of 5/10 following activity   NMR  In // bars for improved step length, single leg stability and stepping strategy: -Alt fwd step over 4" beam w/BUE support progressing to single UE support, x10 per side. Noted significant difficulty clearing beam w/RLE and decreased step length w/RLE.  -Lateral step over 4" beam, x15 per side w/ipsilateral UE abduction w/supination for improved lateral weight shift and upright posture. Pt able to clear beam well w/RLE in lateral direction, unilateral UE support on rail throughout. Pt demonstrated significant flexed posture throughout w/bilateral knee flexion, relying heavily on UE support on rail to maintain balance.   GAIT: Gait pattern: step through pattern, decreased step length- Right, decreased step length- Left, decreased stride length, decreased hip/knee flexion- Right, decreased hip/knee flexion- Left, decreased ankle dorsiflexion- Right, decreased ankle dorsiflexion- Left, Right foot flat, Left foot flat, shuffling, trunk flexed, narrow BOS, poor foot clearance- Right, and poor foot clearance- Left Distance walked: Various clinic distances.  Assistive device utilized: Environmental consultant - 2 wheeled Level of assistance: SBA and CGA Comments: Pt requires mod-max A for safe turns and transfers throughout session due to attempting to sit on mat/bike prior to fully turning and stepping outside of RW while turning. Pt is unresponsive to cues.    PATIENT EDUCATION: Education details: continues to strongly discouraged pt to perform floor transfers at home due to safety concerns  Person educated: Patient Education method: Explanation, Demonstration, and Verbal cues Education comprehension: verbalized understanding, verbal cues required, and needs further education   HOME EXERCISE PROGRAM: Seated PWR moves, Sit to stands, Access  Code: [email or web address]    GOALS: Goals reviewed with patient? Yes  SHORT TERM GOALS: Target date: 12/23/2021  Pt will perform initial HEP with min A from wife for improved strength, balance, transfers and gait.  Baseline: need to review HEP from previous bout of therapy; pt reports he does his HEP alone  Goal status: IN PROGRESS  2.  Pt will  improve normal TUG to less than or equal to 25 seconds w/LRAD for improved functional mobility and decreased fall risk.  Baseline: 30.09s w/RW, significant shuffling w/turns; 37.5s w/RW and increased shuffling w/turns  Goal status: NOT MET  3.  Pt will improve 5 x STS to less than or equal to 20 seconds without UE support to demonstrate improved functional strength and transfer efficiency.   Baseline: 25.59s w/UE support on reps 1&2 due to retropulsion; 22.07s w/BUE support on first rep, no retropulsion noted  Goal status: IN PROGRESS  4.  Pt will verbalize fall prevention strategies in the home and community for reduced fall risk  Baseline:  Goal status: NOT MET  5.  Floor transfer will be performed to assess safety at home w/performing exercises on floor   Baseline:   Goal Status: MET   LONG TERM GOALS: Target date: 01/20/2022  Pt will perform final HEP with min A from wife for improved strength, balance, transfers and gait.  Baseline:  Goal status: INITIAL  2.  Pt will improve cog TUG to less than or equal to 40 seconds w/LRAD for improved functional mobility and decreased fall risk.  Baseline: 52.07s Goal status: INITIAL  3.  Push and release test to be performed and goal written Baseline:  Goal status: INITIAL  4.  Pt will improve gait velocity to at least 2.5 ft/s w/LRAD for improved gait efficiency and safety  Baseline: 2.12 ft/s w/RW  Goal status: INITIAL   ASSESSMENT:  CLINICAL IMPRESSION: Emphasis of skilled PT session on pt education, LE coordination and stepping strategy. Pt continues to report performing floor transfers  at home without assistance and does not recall therapist advising against transfer. Pt reports he will continue to perform despite the safety risks associated with transfer. This therapist continues to strongly encourage pt against performing floor transfers. Pt requires mod-max cues for safe AD management with turns and transfers due to impulsive behavior and attempting to sit down prior to reaching sitting surface. Continue POC.    OBJECTIVE IMPAIRMENTS Abnormal gait, decreased activity tolerance, decreased balance, decreased cognition, decreased coordination, decreased endurance, decreased knowledge of condition, decreased knowledge of use of DME, decreased mobility, difficulty walking, decreased safety awareness, impaired perceived functional ability, and pain.   ACTIVITY LIMITATIONS carrying, lifting, bending, sitting, standing, squatting, stairs, transfers, bed mobility, reach over head, locomotion level, and caring for others  PARTICIPATION LIMITATIONS: meal prep, cleaning, laundry, medication management, personal finances, interpersonal relationship, driving, shopping, community activity, occupation, yard work, and church  PERSONAL FACTORS Age, Behavior pattern, Fitness, Transportation, and 1 comorbidity: polymyositis  are also affecting patient's functional outcome.   REHAB POTENTIAL: Fair due to impaired cognition, lack of family support and poor compliance   CLINICAL DECISION MAKING: Evolving/moderate complexity  EVALUATION COMPLEXITY: Moderate  PLAN: PT FREQUENCY: 1x/week  PT DURATION: 8 weeks  PLANNED INTERVENTIONS: Therapeutic exercises, Therapeutic activity, Neuromuscular re-education, Balance training, Gait training, Patient/Family education, Self Care, Stair training, DME instructions, Manual therapy, and Re-evaluation  PLAN FOR NEXT SESSION: Perform push and release testing and update LTG, pt education regarding safety at home, turns practice, sit <>stands, scifit for  endurance and UE/LE coordination, increased step length, postural control. Work on turns    [employer], PT, DPT 01/13/2022, 3:33 PM

## 2022-01-19 NOTE — Therapy (Signed)
OUTPATIENT SPEECH LANGUAGE PATHOLOGY TREATMENT   Patient Name: Alexander Thomas MRN: [id number] DOB:1945-10-08, 76 y.o., male Today's Date: 01/20/2022  PCP: Janie Morning, DO REFERRING PROVIDER: Marcial Pacas, MD    End of Session - 01/20/22 1110     Visit Number 4    Number of Visits 13    Date for SLP Re-Evaluation 02/20/22    Authorization Type Medicare    Progress Note Due on Visit 10    SLP Start Time 1104   pt had to use restroom   SLP Stop Time  1145    SLP Time Calculation (min) 41 min    Activity Tolerance Patient tolerated treatment well                Past Medical History:  Diagnosis Date   Hypercholesteremia    Migraine    Polymyositis ([place])    Raynaud disease    Tremor of right hand    Past Surgical History:  Procedure Laterality Date   CARPAL TUNNEL RELEASE Left    INGUINAL HERNIA REPAIR Bilateral    WRIST SURGERY Right    Patient Active Problem List   Diagnosis Date Noted   Acne 06/10/2021   Elevated PSA 06/10/2021   Osteoarthritis 06/10/2021   Osteoporosis 06/10/2021   Postherpetic neuralgia 06/10/2021   Raynaud's disease 06/10/2021   Rosacea 06/10/2021   Gait abnormality 08/29/2019   Midline low back pain without sciatica 08/29/2019   Mild cognitive impairment 08/29/2019   Excessive daytime sleepiness 01/24/2019   Nocturia more than twice per night 01/24/2019   Hypersomnia with sleep apnea 01/24/2019   At risk for central sleep apnea 01/24/2019   RLS (restless legs syndrome) 01/24/2019   PAF (paroxysmal atrial fibrillation) ([place]) 06/04/2015   Idiopathic Parkinson's disease 05/07/2015   Polymyositis ([place]) 05/07/2015    ONSET DATE: PD dx 2016; 10/27/2021 (referral date)  REFERRING DIAG: G20 (ICD-10-CM) - Idiopathic Parkinson's disease   THERAPY DIAG:  Dysarthria and anarthria  Cognitive communication deficit  Rationale for Evaluation and Treatment Rehabilitation  SUBJECTIVE:   SUBJECTIVE STATEMENT: "I think things are  getting better"   PAIN:  Are you having pain? Yes: NPRS scale: 2/10 Pain location: L hip   OBJECTIVE:    TODAY'S TREATMENT:  01-20-22: Pt enters with sub-optimal volume, averaging 65 dB, and usual hoarseness. With verbal cues, pt able to increase intensity, resulting in average of 72 dB in automatic speech tasks. With cued use of intent, hoarseness clears. Pt reports he has not done HEP since last visit. SLP leads pt through [place] 2, providing usual modeling prior to pt execution and usual faded to rare min-A for accuracy in completion. Emerging awareness of hoarseness with intermittent ability to correct. Averages as follows: Loud, sustained "ah": 90 dB Counting: 82 dB Reading (phrases): 79 dB Cognitive exercise: 75 dB Usual verbal cues, including recasting of utterances required for carryover of intentional speech through entirety of session. Re-education of HEP, provided visual aid to aid in completion of exercises per established schedule. Following, pt able to find visual aid and recall lesson to complete tomorrow with supervision-A.   12-29-21: Pt reports wife less frequently requesting for repetition, pt tells SLP "I crank it up a bit." Pt has not been completing HEP, agreeable to begin implementing this date. SLP provide written instructions to aid in completion. SLP leads pt through [place] 2, providing usual modeling prior to pt execution and usual faded to rare min-A for accuracy in completion. Averages  as follows: Loud, sustained "ah": 81 dB Counting: 75 dB Reading (phrases): 76 dB Cognitive exercise: 67 dB Improved volume noted in conversation, averages 70 dB, likely 2/2 cognitive component. Hoarseness present, clears through intentional speech. Usual cues required, to include recasting, for carryover of intentional speech to side comments. As session progresses,  pt self-correcting intermittently. ID using "preacher" voice as strategy to aid in carryover of  intentional speech.   12-16-21: Pt enters with sub-optimal volume and usual hoarseness. ID key word which will assist pt in using intent  Target improving vocal quality and increasing intensity through progressively difficulty speech tasks using Speak Out! program, lesson 1. ST leads pt through exercises providing usual model prior to pt execution. Usual mod-A required to achieve target dB this date. Averages this date: loud "ah" 91 dB; reading (phrases) 78 dB; cognitive speech task 74 dB. Pt's hoarseness noted to clear with use of intentional speech. Conversational sample of approx 10 minutes, pt averages 67 dB with usual max-A, to include visual and verbal cueing and recasting. Difficulty using intent when attempting to organize thoughts or recall details in story retell.   11-25-21: Discussed any possible changes in communication, cognition, and swallow function. Pt reports some minimal changes in voice and memory since last course of ST intervention, although hoarseness notably worse (conversational volume was sufficient today). SLP educated patient on recommendation to address vocal quality and possibly memory changes, in which pt agreeable. SLP assisted with ordering Speak Out! Workbook today.    PATIENT EDUCATION: Education details: see above Person educated: Patient Education method: Customer service manager Education comprehension: verbalized understanding, returned demonstration, and needs further education   HOME EXERCISE PROGRAM: Speak Out! Program   GOALS: Goals reviewed with patient? Yes  SHORT TERM GOALS: Target date: 01/20/2022  (extended d/t scheduling issues resulting in pt only having 1 visit scheduled)  Pt will complete Speak Out! HEP at least 1x/day given occasional min A over 2 sessions  Baseline: Goal status: NOT MET  2.  Pt will achieve targeted dB level and clear vocal quality in demonstration of Speak Out! Lessons with 80% accuracy given occasional mod A over 2  sessions Baseline:  Goal status: MET  3.  Pt will utilize dysarthria compensations to optimize vocal intensity and clarity in 5-10 minute conversation given occasional mod A over 2 sessions Baseline:  Goal status: NOT MET  4. Pt will utilize cognitive compensations to aid recall of pertinent information (as needed) given occasional mod A over 2 sessions Baseline:  Goal status: NOT MET  5.  Pt will implement saliva management strategies to reduce drooling given occasional mod A over 2 sessions  Baseline:  Goal status: MET   LONG TERM GOALS: Target date: 02/20/2022  Pt will complete Speak Out! HEP at least 1x/day (BID recommended) over 1 week  Baseline:  Goal status: IN PROGRESS  2.  Pt will achieve targeted dB levels and clear vocal quality in demonstration of Speak Out! Lessons with 90% accuracy given occasional min A over 2 sessions Baseline:  Goal status: IN PROGRESS  3.  Pt will utilize dysarthria compensations to optimize vocal intensity and clarity in 20+ minute conversation given occasional min A over 2 sessions Baseline:  Goal status: IN PROGRESS  4.  Pt will utilize cognitive compensations to aid recall of pertinent information (as needed) given occasional min A over 2 sessions Baseline:  Goal status: IN PROGRESS  5.  Pt will report improved communication effectiveness via PROM by 2 points at  last ST session  Baseline: V-RQOL=15 Goal status: IN PROGRESS   ASSESSMENT:  CLINICAL IMPRESSION: Patient is a 76 y.o. male who was seen today for Parkinson's Disease. Pt participated in OP ST intervention targeting dysarthria from August 2022 to January 2023 to address reduced volume and hoarseness. Inconsistent attendance exhibited related to scheduling, but good progress and carryover of trained techniques demonstrated and reported (benefited when wife attended sessions). Today, pt presents with low 70s dB conversational volume with intermittent increasing to persistent  hoarseness. 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 Pt continues to practice sustained phonation in car to church and wife cues if volume dropping during sermon. Pt stated "sometimes I'm squeaky" if he talks early in day. Limited awareness or concern for hoarseness exhibited at this time, although, hoarseness would likely impact his speech intelligibility while preaching. Endorsed some mild memory changes but unable to provide specifics. Pt appeared to explain away deficits related to age and wife experiencing similar symptoms. Ongoing drooling exhibited and reported. Pt would benefit from skilled ST intervention to maximize communication effectiveness, optimize vocal quality, and increase functional independence.   OBJECTIVE IMPAIRMENTS  Objective impairments include memory, awareness, and dysarthria. These impairments are limiting patient from household responsibilities and effectively communicating at home and in community. Factors affecting potential to achieve goals and functional outcome are cooperation/participation level and medical prognosis. Patient will benefit from skilled SLP services to address above impairments and improve overall function.  REHAB POTENTIAL: Fair - awareness and independent carryover  PLAN: SLP FREQUENCY: 2x/week recommended but pt requested 1x/week due to transportation and limited availability  SLP DURATION: 12 weeks  PLANNED INTERVENTIONS: Language facilitation, Environmental controls, Cueing hierachy, Cognitive reorganization, Internal/external aids, Oral motor exercises, Functional tasks, Multimodal communication approach, SLP instruction and feedback, Compensatory strategies, and Patient/family education    Su Monks, [place] 01/20/2022, 11:38 AM

## 2022-01-19 NOTE — Therapy (Signed)
OUTPATIENT OCCUPATIONAL THERAPY PARKINSON'S EVALUATION  Patient Name: Alexander Thomas MRN: [id number] DOB:12-21-1945, 76 y.o., male Today's Date: 01/20/2022  PCP: Janie Morning, DO REFERRING PROVIDER: Dr. Marcial Pacas   OT End of Session - 01/20/22 1025     Visit Number 4    Number of Visits 13    Date for OT Re-Evaluation 02/23/22    Authorization Type Medicare & [place] (BCBS will cover remaining from [facility] Medicare guidelines    Authorization - Visit Number 4    Authorization - Number of Visits 10    Progress Note Due on Visit 10    OT Start Time 1025    OT Stop Time 1103    OT Time Calculation (min) 38 min    Activity Tolerance Patient tolerated treatment well    Behavior During Therapy WFL for tasks assessed/performed                Past Medical History:  Diagnosis Date   Hypercholesteremia    Migraine    Polymyositis ([place])    Raynaud disease    Tremor of right hand    Past Surgical History:  Procedure Laterality Date   CARPAL TUNNEL RELEASE Left    INGUINAL HERNIA REPAIR Bilateral    WRIST SURGERY Right    Patient Active Problem List   Diagnosis Date Noted   Acne 06/10/2021   Elevated PSA 06/10/2021   Osteoarthritis 06/10/2021   Osteoporosis 06/10/2021   Postherpetic neuralgia 06/10/2021   Raynaud's disease 06/10/2021   Rosacea 06/10/2021   Gait abnormality 08/29/2019   Midline low back pain without sciatica 08/29/2019   Mild cognitive impairment 08/29/2019   Excessive daytime sleepiness 01/24/2019   Nocturia more than twice per night 01/24/2019   Hypersomnia with sleep apnea 01/24/2019   At risk for central sleep apnea 01/24/2019   RLS (restless legs syndrome) 01/24/2019   PAF (paroxysmal atrial fibrillation) ([place]) 06/04/2015   Idiopathic Parkinson's disease 05/07/2015   Polymyositis ([place]) 05/07/2015    ONSET DATE: 10/27/21 (MD referral)  REFERRING DIAG: Parkinson's Disease  THERAPY DIAG:  Other symptoms and signs involving the  nervous system  Stiffness of left elbow, not elsewhere classified  Tremor  Stiffness of right elbow, not elsewhere classified  Stiffness of left shoulder, not elsewhere classified  Stiffness of right shoulder, not elsewhere classified  Attention and concentration deficit  Other symptoms and signs involving the musculoskeletal system  Other lack of coordination  Abnormal posture  Unsteadiness on feet  Rationale for Evaluation and Treatment Rehabilitation  SUBJECTIVE:   SUBJECTIVE STATEMENT: "Doing good"     Pt accompanied by: self  PERTINENT HISTORY: Parkinson's Disease   PMH:  hx of polymyositis, hx of orthostatic tachycardia, memory, Raynaud's disease, L CTR, R wrist fx surgery, L hip fracture 2/22 from fall, hx of L elbow fx due to fall 2/22 (surgery x2), sleep apnea, restless leg syndrome, hx of back pain, mild cognitive impairment.  PRECAUTIONS: Fall  WEIGHT BEARING RESTRICTIONS No  PAIN:  Are you having pain? Yes: NPRS scale: 2-3/10 Pain location: L hip, sometimes knee Pain description: sharp-knee, dull sometimes sharp-hip Aggravating factors: unknown Relieving factors: sitting  FALLS: Has patient fallen in last 6 months? Yes. Number of falls "slipped a time or 2 in the yard"--able to to get up himself  LIVING ENVIRONMENT: Lives with: lives with their family and lives with their spouse Lives in: House/apartment Has following equipment at home: Single point cane, Environmental consultant - 2 wheeled, Electronics engineer, and Grab bars  PLOF: Independent, Vocation/Vocational requirements: retired, but continues preach on Sundays for [employer], and Leisure: has enjoyed Medical illustrator, gardening, carpentry  PATIENT GOALS  improve movement  HAND DOMINANCE: Right  OBJECTIVE:   TODAY'S TREATMENT:   Assessed PPT #4 and reviewed strategy for donning/doffing jacket.  Pt able to don with min-mod cueing for large amplitude and review of strategy, min A and doff with  min cueing for review/use of large amplitude movement strategy (use momentum to flip jacket over head big, doff by positioning hands low and trunk rotation).     Sitting, functional reaching laterally with trunk rotation and lateral wt. Shift and overhead with forward wt shifts with min cueing and set-up for large amplitude movements with each UE.     PATIENT EDUCATION: Education: Reviewed supine closed-chain shoulder flex and chest press and seated PWR! Up and rock (with mirror for feedback); reviewed strategy for donning/doffing jacket. Person educated: Patient Education method: Explanation, Demonstration, Tactile cues, Verbal cues, and Handouts (from previous) Education comprehension: verbalized understanding, returned demonstration, verbal cues required, tactile cues required, and needs further education   HOME EXERCISE PROGRAM: 12/16/21:  PWR! Hands (basic 4) 12/29/21:  Review of prior Coordination HEP and supine closed-chain shoulder flex and chest press.  Organized therapy folder, recommended walker bag/tray 01/20/22:  reviewed strategy for donning/doffing jacket.  ASSESSMENT:  CLINICAL IMPRESSION: Pt is progressing slowly towards goals, but needs cueing and repetition for larger amplitude movements.  Pt benefits from verbal and visual cueing.  PERFORMANCE DEFICITS in functional skills including ADLs, IADLs, coordination, dexterity, tone, ROM, FMC, GMC, mobility, balance, endurance, decreased knowledge of precautions, decreased knowledge of use of DME, and UE functional use, cognitive skills including memory, and psychosocial skills including environmental adaptation and habits.   IMPAIRMENTS are limiting patient from ADLs, IADLs, work, and leisure.   COMORBIDITIES may have co-morbidities  that affects occupational performance. Patient will benefit from skilled OT to address above impairments and improve overall function.  MODIFICATION OR ASSISTANCE TO COMPLETE EVALUATION:  Min-Moderate modification of tasks or assist with assess necessary to complete an evaluation.  OT OCCUPATIONAL PROFILE AND HISTORY: Detailed assessment: Review of records and additional review of physical, cognitive, psychosocial history related to current functional performance.  CLINICAL DECISION MAKING: Moderate - several treatment options, min-mod task modification necessary  REHAB POTENTIAL: Good  EVALUATION COMPLEXITY: Moderate    GOALS: Potential Goals reviewed with patient? Yes  SHORT TERM GOALS: Target date: 12/23/2021   Pt will be independent with updated PD-specific HEP. Goal status: IN PROGRESS  2.  Pt will verbalize understanding of ways to decr risk of complications related to PD. Goal status: INITIAL  3.  Pt will improve RUE functional reaching and coordination for ADLs as shown by improving score on box and blocks test by at least 3 with RUE. Baseline:  30 blocks Goal status: INITIAL  4.  Pt will improve RUE functional reaching and coordination for ADLs as shown by improving score on box and blocks test by at least 3 with LUE. Baseline:  23 blocks Goal status: INITIAL  5.  Pt will demo at least 120* shoulder flex with at least -40* elbow ext with RUE for overhead reach. Baseline:  120* with -50* elbow ext Goal status: INITIAL   LONG TERM GOALS: Target date: 11/6//2023     Pt will verbalize understanding of adaptive strategies/AE to incr safety/ease with ADLs/IADLs. Goal status: INITIAL  2.  Pt will improve RUE functional reaching and coordination for ADLs as  shown by improving time on 9-hole peg test by at least 3sec with RUE. Baseline:  44.31sec Goal status: INITIAL  3.  Pt will improve bilatateral coordination and incr ease with dressing as shown by fastening/unfastening 3 buttons in less than 60sec. Baseline: 72.32sec Goal status: INITIAL  4.  Pt will improve standing functional reach by at least 2 inches with RUE.  Baseline:  R-4", L-7" Goal status:  INITIAL  5.  Assess PPT#4 (donning/doffing jacket) and establish goal as appropriate.   Goal status: Assessed--defer goal.     PLAN: OT FREQUENCY: 1x/week at pt request  OT DURATION: 12 weeks +eval (may d/c after 8 weeks, depending on progress)  PLANNED INTERVENTIONS: self care/ADL training, therapeutic exercise, therapeutic activity, neuromuscular re-education, passive range of motion, gait training, balance training, functional mobility training, ultrasound, moist heat, cryotherapy, patient/family education, cognitive remediation/compensation, and DME and/or AE instructions  RECOMMENDED OTHER SERVICES: current with PT, ST  CONSULTED AND AGREED WITH PLAN OF CARE: Patient  PLAN FOR NEXT SESSION:  check STGs; review PWR! Moves in supine vs. ?PWR! Up, rock, twist in modified quadruped, buttons, dynamic functional reach   [facility], OTR/L 01/20/2022, 3:11 PM

## 2022-01-20 ENCOUNTER — Ambulatory Visit: Payer: Medicare Other | Admitting: Physical Therapy

## 2022-01-20 ENCOUNTER — Encounter: Payer: Self-pay | Admitting: Occupational Therapy

## 2022-01-20 ENCOUNTER — Ambulatory Visit: Payer: Medicare Other | Admitting: Occupational Therapy

## 2022-01-20 ENCOUNTER — Encounter: Payer: Self-pay | Admitting: Physical Therapy

## 2022-01-20 ENCOUNTER — Ambulatory Visit: Payer: Medicare Other | Attending: Family Medicine | Admitting: Speech Pathology

## 2022-01-20 DIAGNOSIS — M25611 Stiffness of right shoulder, not elsewhere classified: Secondary | ICD-10-CM | POA: Diagnosis not present

## 2022-01-20 DIAGNOSIS — R251 Tremor, unspecified: Secondary | ICD-10-CM

## 2022-01-20 DIAGNOSIS — R293 Abnormal posture: Secondary | ICD-10-CM | POA: Diagnosis not present

## 2022-01-20 DIAGNOSIS — R2681 Unsteadiness on feet: Secondary | ICD-10-CM | POA: Insufficient documentation

## 2022-01-20 DIAGNOSIS — M25622 Stiffness of left elbow, not elsewhere classified: Secondary | ICD-10-CM | POA: Insufficient documentation

## 2022-01-20 DIAGNOSIS — R278 Other lack of coordination: Secondary | ICD-10-CM | POA: Insufficient documentation

## 2022-01-20 DIAGNOSIS — R41841 Cognitive communication deficit: Secondary | ICD-10-CM | POA: Insufficient documentation

## 2022-01-20 DIAGNOSIS — M25621 Stiffness of right elbow, not elsewhere classified: Secondary | ICD-10-CM | POA: Diagnosis not present

## 2022-01-20 DIAGNOSIS — R2689 Other abnormalities of gait and mobility: Secondary | ICD-10-CM | POA: Insufficient documentation

## 2022-01-20 DIAGNOSIS — R29818 Other symptoms and signs involving the nervous system: Secondary | ICD-10-CM

## 2022-01-20 DIAGNOSIS — M25612 Stiffness of left shoulder, not elsewhere classified: Secondary | ICD-10-CM

## 2022-01-20 DIAGNOSIS — R29898 Other symptoms and signs involving the musculoskeletal system: Secondary | ICD-10-CM | POA: Diagnosis not present

## 2022-01-20 DIAGNOSIS — R41840 Attention and concentration deficit: Secondary | ICD-10-CM | POA: Diagnosis not present

## 2022-01-20 DIAGNOSIS — R471 Dysarthria and anarthria: Secondary | ICD-10-CM | POA: Diagnosis not present

## 2022-01-20 NOTE — Therapy (Signed)
OUTPATIENT PHYSICAL THERAPY NEURO TREATMENT   Patient Name: Alexander Thomas MRN: [id number] DOB:04-20-1946, 76 y.o., male Today's Date: 01/20/2022   PCP: Janie Morning, DO REFERRING PROVIDER: Marcial Pacas, MD    PT End of Session - 01/20/22 [phone]     Visit Number 5    Number of Visits 9   Plus eval   Date for PT Re-Evaluation 01/27/22    Authorization Type Medicare- part A & B    PT Start Time 0934    PT Stop Time 1014    PT Time Calculation (min) 40 min    Equipment Utilized During Treatment Gait belt    Activity Tolerance Patient tolerated treatment well    Behavior During Therapy WFL for tasks assessed/performed               Past Medical History:  Diagnosis Date   Hypercholesteremia    Migraine    Polymyositis ([place])    Raynaud disease    Tremor of right hand    Past Surgical History:  Procedure Laterality Date   CARPAL TUNNEL RELEASE Left    INGUINAL HERNIA REPAIR Bilateral    WRIST SURGERY Right    Patient Active Problem List   Diagnosis Date Noted   Acne 06/10/2021   Elevated PSA 06/10/2021   Osteoarthritis 06/10/2021   Osteoporosis 06/10/2021   Postherpetic neuralgia 06/10/2021   Raynaud's disease 06/10/2021   Rosacea 06/10/2021   Gait abnormality 08/29/2019   Midline low back pain without sciatica 08/29/2019   Mild cognitive impairment 08/29/2019   Excessive daytime sleepiness 01/24/2019   Nocturia more than twice per night 01/24/2019   Hypersomnia with sleep apnea 01/24/2019   At risk for central sleep apnea 01/24/2019   RLS (restless legs syndrome) 01/24/2019   PAF (paroxysmal atrial fibrillation) ([place]) 06/04/2015   Idiopathic Parkinson's disease 05/07/2015   Polymyositis ([place]) 05/07/2015    ONSET DATE: 10/27/2021   REFERRING DIAG: G20 (ICD-10-CM) - Idiopathic Parkinson's disease ([place])   THERAPY DIAG:  Unsteadiness on feet  Abnormal posture  Other lack of coordination  Other abnormalities of gait and mobility  Rationale for  Evaluation and Treatment Rehabilitation  SUBJECTIVE:                                                                                                                                                                                              SUBJECTIVE STATEMENT: Feels like his walking is getting better. No falls. Reports exercises are going fair, but could be going better.   Pt accompanied by: self  PERTINENT HISTORY:  hyperlipidemia, Raynaud's disease, had left carpal tunnel release surgery,  right wrist fracture surgery, polymyositis,  left deltoid muscle biopsy consistent with fiber atrophy, perivasculitis in August 2002  PAIN:  Are you having pain? Yes: NPRS scale: 2/10 Pain location: L hip and knee, L elbow Pain description: Sharp Aggravating factors: "I do not think so" Relieving factors: "takin it easy"  PRECAUTIONS: Fall  PLOF: Requires assistive device for independence, Needs assistance with ADLs, and Needs assistance with homemaking  PATIENT GOALS "I wanna get to where I do not have to depend on the cane"   OBJECTIVE:   TODAY'S TREATMENT:  Ther Ex  SciFit with BUE/BLE at gear 3.0 for 8 minutes for strengthening, ROM, activity tolerance, and reciprocal movement patterns. Pt reporting an 8/10 RPE. Cues intermittently to maintain knee extension ROM.   NMR  TUG Shuttle for sit <> stands, short distance gait, and turns, performed with a chair approx. 15' away from mat table, performed down and back x6 reps. Focused on incr forward lean when standing, incr stride length and turning with big marching vs. Small shuffled steps. Instead of cued for marching, cued pt to take as few steps as possible. Pt seemed to do better with this cue, pt starting off turn with moving bigger but then reverts back to more shorter and shuffled steps. PT needs to provide cues throughout turn for larger amplitude movement and incr foot clearance.  Standing marching with BUE support at [place] with focus on  posture, and marching legs up to red boomwhacker for incr ROM/intensity of movement, x15 reps each side, pt with more difficulty lifting up LLE.   Pt performs PWR! Moves in sitting position x 10 reps   PWR! Up for improved posture - with mirror in front of pt as visual feedback for upright posture. Verbal/tactile cues for scap retraction, opening up hands and elbow extension.  Reviewed from HEP as pt had not been consistently performing. Pt reporting effort level as a 6/10, discussed to work towards an 8/10.    With sit <> stands during session, cued for scooting out towards edge, wider BOS, and incr forward lean to stand. Needs cues for proper BUE placement (not pulling up on walker to stand) and reaching back towards mat table.   GAIT: Gait pattern: step through pattern, decreased step length- Right, decreased step length- Left, decreased stride length, decreased hip/knee flexion- Right, decreased hip/knee flexion- Left, decreased ankle dorsiflexion- Right, decreased ankle dorsiflexion- Left, Right foot flat, Left foot flat, shuffling, trunk flexed, narrow BOS, poor foot clearance- Right, and poor foot clearance- Left Distance walked: Various clinic distances.  Assistive device utilized: Walker - 2 wheeled Level of assistance: SBA and CGA Comments: Cued for gait with posture and incr stride length bilat. When turning, cued to stay inside RW and to pick up legs in a march for improved foot clearance.    PATIENT EDUCATION: Education details: turn training with RW, importance of performing HEP at home.  Person educated: Patient Education method: Explanation, Demonstration, and Verbal cues Education comprehension: verbalized understanding, verbal cues required, and needs further education   HOME EXERCISE PROGRAM: Seated PWR moves, Sit to stands, Access Code: [email or web address]    GOALS: Goals reviewed with patient? Yes  SHORT TERM GOALS: Target date: 12/23/2021  Pt will perform initial HEP with  min A from wife for improved strength, balance, transfers and gait.  Baseline: need to review HEP from previous bout of therapy; pt reports he does his HEP alone  Goal status: IN PROGRESS  2.  Pt will improve normal  TUG to less than or equal to 25 seconds w/LRAD for improved functional mobility and decreased fall risk.  Baseline: 30.09s w/RW, significant shuffling w/turns; 37.5s w/RW and increased shuffling w/turns  Goal status: NOT MET  3.  Pt will improve 5 x STS to less than or equal to 20 seconds without UE support to demonstrate improved functional strength and transfer efficiency.   Baseline: 25.59s w/UE support on reps 1&2 due to retropulsion; 22.07s w/BUE support on first rep, no retropulsion noted  Goal status: IN PROGRESS  4.  Pt will verbalize fall prevention strategies in the home and community for reduced fall risk  Baseline:  Goal status: NOT MET  5.  Floor transfer will be performed to assess safety at home w/performing exercises on floor   Baseline:   Goal Status: MET   LONG TERM GOALS: Target date: 01/20/2022 Updated goal date for 8 week POC: 01/27/22  Pt will perform final HEP with min A from wife for improved strength, balance, transfers and gait.  Baseline:  Goal status: INITIAL  2.  Pt will improve cog TUG to less than or equal to 40 seconds w/LRAD for improved functional mobility and decreased fall risk.  Baseline: 52.07s Goal status: INITIAL  3.  Push and release test to be performed and goal written Baseline:  Goal status: INITIAL  4.  Pt will improve gait velocity to at least 2.5 ft/s w/LRAD for improved gait efficiency and safety  Baseline: 2.12 ft/s w/RW  Goal status: INITIAL   ASSESSMENT:  CLINICAL IMPRESSION: Today's skilled session focused on larger amplitude movement patterns, upright posture, and sit to stand training and turning with RW. Pt continues to need cues for proper set up with sit <> Stands and incr forward lean. Worked on  turning for larger steps and incr foot clearance, pt did better with cues to try to take as few steps as possible, but still needs more practice. Pt would start off turn with larger steps, but then revert to more smaller steps. Updated pt's LTG date to 01/27/22 due to 8 week POC and STGs were checked on 12/29/21.  Will continue per POC.    OBJECTIVE IMPAIRMENTS Abnormal gait, decreased activity tolerance, decreased balance, decreased cognition, decreased coordination, decreased endurance, decreased knowledge of condition, decreased knowledge of use of DME, decreased mobility, difficulty walking, decreased safety awareness, impaired perceived functional ability, and pain.   ACTIVITY LIMITATIONS carrying, lifting, bending, sitting, standing, squatting, stairs, transfers, bed mobility, reach over head, locomotion level, and caring for others  PARTICIPATION LIMITATIONS: meal prep, cleaning, laundry, medication management, personal finances, interpersonal relationship, driving, shopping, community activity, occupation, yard work, and church  PERSONAL FACTORS Age, Behavior pattern, Fitness, Transportation, and 1 comorbidity: polymyositis  are also affecting patient's functional outcome.   REHAB POTENTIAL: Fair due to impaired cognition, lack of family support and poor compliance   CLINICAL DECISION MAKING: Evolving/moderate complexity  EVALUATION COMPLEXITY: Moderate  PLAN: PT FREQUENCY: 1x/week  PT DURATION: 8 weeks  PLANNED INTERVENTIONS: Therapeutic exercises, Therapeutic activity, Neuromuscular re-education, Balance training, Gait training, Patient/Family education, Self Care, Stair training, DME instructions, Manual therapy, and Re-evaluation  PLAN FOR NEXT SESSION: Check LTGs, willl need re-cert for remaining visits.  pt education regarding safety at home, turns practice, sit <>stands, scifit for endurance and UE/LE coordination, increased step length, postural control. Work on turns     [employer], PT, DPT 01/20/2022, 10:15 AM

## 2022-01-26 DIAGNOSIS — M810 Age-related osteoporosis without current pathological fracture: Secondary | ICD-10-CM | POA: Diagnosis not present

## 2022-01-27 ENCOUNTER — Encounter: Payer: Self-pay | Admitting: Occupational Therapy

## 2022-01-27 ENCOUNTER — Ambulatory Visit: Payer: Medicare Other | Admitting: Occupational Therapy

## 2022-01-27 ENCOUNTER — Ambulatory Visit: Payer: Medicare Other

## 2022-01-27 ENCOUNTER — Ambulatory Visit: Payer: Medicare Other | Admitting: Physical Therapy

## 2022-01-27 DIAGNOSIS — R2681 Unsteadiness on feet: Secondary | ICD-10-CM

## 2022-01-27 DIAGNOSIS — R293 Abnormal posture: Secondary | ICD-10-CM

## 2022-01-27 DIAGNOSIS — R278 Other lack of coordination: Secondary | ICD-10-CM | POA: Diagnosis not present

## 2022-01-27 DIAGNOSIS — R471 Dysarthria and anarthria: Secondary | ICD-10-CM | POA: Diagnosis not present

## 2022-01-27 DIAGNOSIS — M25621 Stiffness of right elbow, not elsewhere classified: Secondary | ICD-10-CM

## 2022-01-27 DIAGNOSIS — M25612 Stiffness of left shoulder, not elsewhere classified: Secondary | ICD-10-CM

## 2022-01-27 DIAGNOSIS — M25622 Stiffness of left elbow, not elsewhere classified: Secondary | ICD-10-CM

## 2022-01-27 DIAGNOSIS — R41841 Cognitive communication deficit: Secondary | ICD-10-CM | POA: Diagnosis not present

## 2022-01-27 DIAGNOSIS — R41840 Attention and concentration deficit: Secondary | ICD-10-CM

## 2022-01-27 DIAGNOSIS — R29818 Other symptoms and signs involving the nervous system: Secondary | ICD-10-CM | POA: Diagnosis not present

## 2022-01-27 DIAGNOSIS — R251 Tremor, unspecified: Secondary | ICD-10-CM

## 2022-01-27 DIAGNOSIS — M25611 Stiffness of right shoulder, not elsewhere classified: Secondary | ICD-10-CM

## 2022-01-27 DIAGNOSIS — R29898 Other symptoms and signs involving the musculoskeletal system: Secondary | ICD-10-CM

## 2022-01-27 NOTE — Therapy (Signed)
OUTPATIENT OCCUPATIONAL THERAPY PARKINSON'S EVALUATION  Patient Name: Alexander Thomas MRN: [id number] DOB:19-Sep-1945, 76 y.o., male Today's Date: 01/27/2022  PCP: Janie Morning, DO REFERRING PROVIDER: Dr. Marcial Pacas   OT End of Session - 01/27/22 0939     Visit Number 5    Number of Visits 13    Date for OT Re-Evaluation 02/23/22    Authorization Type Medicare & [place] (BCBS will cover remaining from [facility] Medicare guidelines    Authorization - Visit Number 5    Authorization - Number of Visits 10    Progress Note Due on Visit 10    OT Start Time [phone]    OT Stop Time 1015    OT Time Calculation (min) 38 min    Activity Tolerance Patient tolerated treatment well    Behavior During Therapy WFL for tasks assessed/performed                Past Medical History:  Diagnosis Date   Hypercholesteremia    Migraine    Polymyositis ([place])    Raynaud disease    Tremor of right hand    Past Surgical History:  Procedure Laterality Date   CARPAL TUNNEL RELEASE Left    INGUINAL HERNIA REPAIR Bilateral    WRIST SURGERY Right    Patient Active Problem List   Diagnosis Date Noted   Acne 06/10/2021   Elevated PSA 06/10/2021   Osteoarthritis 06/10/2021   Osteoporosis 06/10/2021   Postherpetic neuralgia 06/10/2021   Raynaud's disease 06/10/2021   Rosacea 06/10/2021   Gait abnormality 08/29/2019   Midline low back pain without sciatica 08/29/2019   Mild cognitive impairment 08/29/2019   Excessive daytime sleepiness 01/24/2019   Nocturia more than twice per night 01/24/2019   Hypersomnia with sleep apnea 01/24/2019   At risk for central sleep apnea 01/24/2019   RLS (restless legs syndrome) 01/24/2019   PAF (paroxysmal atrial fibrillation) ([place]) 06/04/2015   Idiopathic Parkinson's disease 05/07/2015   Polymyositis ([place]) 05/07/2015    ONSET DATE: 10/27/21 (MD referral)  REFERRING DIAG: Parkinson's Disease  THERAPY DIAG:  Other lack of coordination  Other  symptoms and signs involving the nervous system  Stiffness of left elbow, not elsewhere classified  Tremor  Stiffness of right elbow, not elsewhere classified  Stiffness of left shoulder, not elsewhere classified  Stiffness of right shoulder, not elsewhere classified  Attention and concentration deficit  Other symptoms and signs involving the musculoskeletal system  Unsteadiness on feet  Abnormal posture  Rationale for Evaluation and Treatment Rehabilitation  SUBJECTIVE:   SUBJECTIVE STATEMENT: Getting up is a little easier    Pt accompanied by: self  PERTINENT HISTORY: Parkinson's Disease   PMH:  hx of polymyositis, hx of orthostatic tachycardia, memory, Raynaud's disease, L CTR, R wrist fx surgery, L hip fracture 2/22 from fall, hx of L elbow fx due to fall 2/22 (surgery x2), sleep apnea, restless leg syndrome, hx of back pain, mild cognitive impairment.  PRECAUTIONS: Fall  WEIGHT BEARING RESTRICTIONS No  PAIN:  Are you having pain? Yes: NPRS scale: 2/10 Pain location: L hip, sometimes knee Pain description: sharp-knee, dull sometimes sharp-hip Aggravating factors: unknown Relieving factors: sitting  FALLS: Has patient fallen in last 6 months? Yes. Number of falls "slipped a time or 2 in the yard"--able to to get up himself  LIVING ENVIRONMENT: Lives with: lives with their family and lives with their spouse Lives in: House/apartment Has following equipment at home: Single point cane, Environmental consultant - 2 wheeled, Electronics engineer, and  Grab bars  PLOF: Independent, Vocation/Vocational requirements: retired, but continues preach on Sundays for [employer], and Leisure: has enjoyed Medical illustrator, gardening, carpentry  PATIENT GOALS  improve movement  HAND DOMINANCE: Right  OBJECTIVE:   TODAY'S TREATMENT:   PWR! Up, rock, reach in modified quadruped at table x10-20 each with mirror for visual cueing and min-mod verbal cueing for large amplitude.  Arm  bike x61mn level 1 for reciprocal movement with cues/target of at least 30rpms for intensity while maintaining movement amplitude/reciprocal movement.  Pt maintained 23-24rpms  Seated, closed-chain shoulder flex (floor>overhead) with mirror for visual cueing and min v.c. for large amplitude movements.  Checked STGs and discussed progress--see goal section below  Flipping cards with each hand with min cueing for large amplitude movements.     HOME EXERCISE PROGRAM: 12/16/21:  PWR! Hands (basic 4) 12/29/21:  Review of prior Coordination HEP and supine closed-chain shoulder flex and chest press.  Organized therapy folder, recommended walker bag/tray 01/20/22:  reviewed strategy for donning/doffing jacket. 01/27/22:  Reviewed ways to decr risk of complications related to PD; reviewed PWR! Up, rock, twist in modified quadruped  ASSESSMENT:  CLINICAL IMPRESSION: Pt met 4/5 STGs, but needs cueing and repetition for larger amplitude movements and HEP.  Pt benefits from verbal and visual cueing.  PERFORMANCE DEFICITS in functional skills including ADLs, IADLs, coordination, dexterity, tone, ROM, FMC, GMC, mobility, balance, endurance, decreased knowledge of precautions, decreased knowledge of use of DME, and UE functional use, cognitive skills including memory, and psychosocial skills including environmental adaptation and habits.   IMPAIRMENTS are limiting patient from ADLs, IADLs, work, and leisure.   COMORBIDITIES may have co-morbidities  that affects occupational performance. Patient will benefit from skilled OT to address above impairments and improve overall function.  MODIFICATION OR ASSISTANCE TO COMPLETE EVALUATION: Min-Moderate modification of tasks or assist with assess necessary to complete an evaluation.  OT OCCUPATIONAL PROFILE AND HISTORY: Detailed assessment: Review of records and additional review of physical, cognitive, psychosocial history related to current functional  performance.  CLINICAL DECISION MAKING: Moderate - several treatment options, min-mod task modification necessary  REHAB POTENTIAL: Good  EVALUATION COMPLEXITY: Moderate    GOALS: Potential Goals reviewed with patient? Yes  SHORT TERM GOALS: Target date: 12/23/2021, 01/22/22  Pt will be independent with updated PD-specific HEP. Goal status: IN PROGRESS  2.  Pt will verbalize understanding of ways to decr risk of complications related to PD. Goal status: MET  01/27/22 Reviewed  3.  Pt will improve RUE functional reaching and coordination for ADLs as shown by improving score on box and blocks test by at least 3 with RUE. Baseline:  30 blocks Goal status: MET  01/27/22:  33 blocks  4.  Pt will improve RUE functional reaching and coordination for ADLs as shown by improving score on box and blocks test by at least 3 with LUE. Baseline:  23 blocks Goal status: MET  01/27/22  30 blocks  5.  Pt will demo at least 120* shoulder flex with at least -40* elbow ext with RUE for overhead reach. Baseline:  120* with -50* elbow ext Goal status: MET  01/27/22:  120* with -20* elbow ext    LONG TERM GOALS: Target date: 11/6//2023     Pt will verbalize understanding of adaptive strategies/AE to incr safety/ease with ADLs/IADLs. Goal status: INITIAL  2.  Pt will improve RUE functional reaching and coordination for ADLs as shown by improving time on 9-hole peg test by at least 3sec  with RUE. Baseline:  44.31sec Goal status: INITIAL  3.  Pt will improve bilatateral coordination and incr ease with dressing as shown by fastening/unfastening 3 buttons in less than 60sec. Baseline: 72.32sec Goal status: INITIAL  4.  Pt will improve standing functional reach by at least 2 inches with RUE.  Baseline:  R-4", L-7" Goal status: INITIAL  5.  Assess PPT#4 (donning/doffing jacket) and establish goal as appropriate.   Goal status: Assessed--defer goal.     PLAN: OT FREQUENCY: 1x/week at pt  request  OT DURATION: 12 weeks +eval (may d/c after 8 weeks, depending on progress)  PLANNED INTERVENTIONS: self care/ADL training, therapeutic exercise, therapeutic activity, neuromuscular re-education, passive range of motion, gait training, balance training, functional mobility training, ultrasound, moist heat, cryotherapy, patient/family education, cognitive remediation/compensation, and DME and/or AE instructions  RECOMMENDED OTHER SERVICES: current with PT, ST  CONSULTED AND AGREED WITH PLAN OF CARE: Patient  PLAN FOR NEXT SESSION:  review PWR! Moves in supine, buttons, dynamic functional reach, work towards remaining goals   [facility], OTR/L 01/27/2022, 12:27 PM

## 2022-01-27 NOTE — Therapy (Signed)
OUTPATIENT PHYSICAL THERAPY NEURO TREATMENT- RECERTIFICATION   Patient Name: Alexander Thomas MRN: [id number] DOB:03-23-1946, 76 y.o., male Today's Date: 01/27/2022   PCP: Janie Morning, DO REFERRING PROVIDER: Marcial Pacas, MD    PT End of Session - 01/27/22 1021     Visit Number 6    Number of Visits 9   Plus eval   Date for PT Re-Evaluation 82/50/03   Recert   Authorization Type Medicare- part A & B    PT Start Time 1019   Handoff w/OT   PT Stop Time 1059    PT Time Calculation (min) 40 min    Equipment Utilized During Treatment Gait belt    Activity Tolerance Patient tolerated treatment well    Behavior During Therapy WFL for tasks assessed/performed                Past Medical History:  Diagnosis Date   Hypercholesteremia    Migraine    Polymyositis ([place])    Raynaud disease    Tremor of right hand    Past Surgical History:  Procedure Laterality Date   CARPAL TUNNEL RELEASE Left    INGUINAL HERNIA REPAIR Bilateral    WRIST SURGERY Right    Patient Active Problem List   Diagnosis Date Noted   Acne 06/10/2021   Elevated PSA 06/10/2021   Osteoarthritis 06/10/2021   Osteoporosis 06/10/2021   Postherpetic neuralgia 06/10/2021   Raynaud's disease 06/10/2021   Rosacea 06/10/2021   Gait abnormality 08/29/2019   Midline low back pain without sciatica 08/29/2019   Mild cognitive impairment 08/29/2019   Excessive daytime sleepiness 01/24/2019   Nocturia more than twice per night 01/24/2019   Hypersomnia with sleep apnea 01/24/2019   At risk for central sleep apnea 01/24/2019   RLS (restless legs syndrome) 01/24/2019   PAF (paroxysmal atrial fibrillation) ([place]) 06/04/2015   Idiopathic Parkinson's disease 05/07/2015   Polymyositis ([place]) 05/07/2015    ONSET DATE: 10/27/2021   REFERRING DIAG: G20 (ICD-10-CM) - Idiopathic Parkinson's disease ([place])   THERAPY DIAG:  Unsteadiness on feet  Abnormal posture  Other lack of coordination  Rationale for  Evaluation and Treatment Rehabilitation  SUBJECTIVE:                                                                                                                                                                                              SUBJECTIVE STATEMENT: States his exercises are going "slowly". States he has no falls "yet". No new changes   Pt accompanied by: self  PERTINENT HISTORY:  hyperlipidemia, Raynaud's disease, had left carpal tunnel release surgery, right wrist fracture surgery,  polymyositis,  left deltoid muscle biopsy consistent with fiber atrophy, perivasculitis in August 2002  PAIN:  Are you having pain? Yes: NPRS scale: 2/10 Pain location: L hip and knee, L elbow Pain description: Sharp Aggravating factors: "I do not think so" Relieving factors: "takin it easy"  PRECAUTIONS: Fall  PLOF: Requires assistive device for independence, Needs assistance with ADLs, and Needs assistance with homemaking  PATIENT GOALS "I wanna get to where I do not have to depend on the cane"   OBJECTIVE:   TODAY'S TREATMENT:  Ther Ex  SciFit multi-peaks level 3 for 8 minutes using BUE/BLEs for neural priming for reciprocal movement, dynamic cardiovascular warmup and increased amplitude of stepping. RPE of 5/10 following activity. Cues intermittently to maintain knee extension ROM.   Ther Act  LTG Assessment   Primed cog TUG by performing 3 normal TUGs in order for pt to understand instructions. Noted difficulty w/initiation of gait and turns   [facility] PT Assessment - 01/27/22 1042       Ambulation/Gait   Gait velocity 32.8' over 19.03s = 1.57f/s w/RW      Timed Up and Go Test   Cognitive TUG (seconds) 68.35   60.31s, 74.81s, 69.94s w/RW, counting retro by 1s           NMR Added to HEP (see bolded below) fr improved step clearance/length and sequencing during turns. Pt required min-mod multimodal cues initially to perform but was able to demonstrate well. Noted decreased  step clearance w/RLE throughout.    GAIT: Gait pattern: step through pattern, decreased step length- Right, decreased step length- Left, decreased stride length, decreased hip/knee flexion- Right, decreased hip/knee flexion- Left, decreased ankle dorsiflexion- Right, decreased ankle dorsiflexion- Left, Right foot flat, Left foot flat, shuffling, trunk flexed, narrow BOS, poor foot clearance- Right, and poor foot clearance- Left Distance walked: Various clinic distances.  Assistive device utilized: Walker - 2 wheeled Level of assistance: SBA and CGA Comments: Cued for gait with posture and incr stride length bilat. When turning, cued to stay inside RW and to pick up legs in a march for improved foot clearance.    PATIENT EDUCATION: Education details: Goal assessment, additions to HEP  Person educated: Patient Education method: EConsulting civil engineer Demonstration, Verbal cues, and Handouts Education comprehension: verbalized understanding, verbal cues required, and needs further education   HOME EXERCISE PROGRAM: Seated PWR moves, Sit to stands Access Code: [email or web address] URL: [email or web address] Date: 01/27/2022 Prepared by: JMickie BailPlaster  Exercises - Alternating Step Backward with Support  - 1-2 x daily - 5 x weekly - 1-2 sets - 10 reps - Side to Side Weight Shift with Overhead Reach and Counter Support  - 1-2 x daily - 5 x weekly - 1-2 sets - 10 reps - Standing Quarter Turn with Counter Support  - 1 x daily - 7 x weekly - 3 sets - 10 reps  GOALS: Goals reviewed with patient? Yes  SHORT TERM GOALS: Target date: 12/23/2021  Pt will perform initial HEP with min A from wife for improved strength, balance, transfers and gait.  Baseline: need to review HEP from previous bout of therapy; pt reports he does his HEP alone  Goal status: IN PROGRESS  2.  Pt will improve normal TUG to less than or equal to 25 seconds w/LRAD for improved functional mobility and decreased fall  risk.  Baseline: 30.09s w/RW, significant shuffling w/turns; 37.5s w/RW and increased shuffling w/turns  Goal status: NOT MET  3.  Pt will improve 5 x STS  to less than or equal to 20 seconds without UE support to demonstrate improved functional strength and transfer efficiency.   Baseline: 25.59s w/UE support on reps 1&2 due to retropulsion; 22.07s w/BUE support on first rep, no retropulsion noted  Goal status: IN PROGRESS  4.  Pt will verbalize fall prevention strategies in the home and community for reduced fall risk  Baseline:  Goal status: NOT MET  5.  Floor transfer will be performed to assess safety at home w/performing exercises on floor   Baseline:   Goal Status: MET   LONG TERM GOALS: Target date: 01/20/2022 Updated goal date for 8 week POC: 01/27/22  Pt will perform final HEP with min A from wife for improved strength, balance, transfers and gait.  Baseline:  Goal status: IN PROGRESS  2.  Pt will improve cog TUG to less than or equal to 40 seconds w/LRAD for improved functional mobility and decreased fall risk.  Baseline: 52.07s; 68.35s on 10/10  Goal status: NOT MET  3.  Push and release test to be performed and goal written Baseline:  Goal status: NOT MET  4.  Pt will improve gait velocity to at least 2.5 ft/s w/LRAD for improved gait efficiency and safety  Baseline: 2.12 ft/s w/RW; 1.72 ft/s w/RW Goal status: NOT MET   UPDATED LONG TERM GOALS DUE TO EXTENDED POC:  Target date: 02/17/2022  Pt will perform final HEP with min A from wife for improved strength, balance, transfers and gait. Baseline:  Goal status: INITIAL  2.  Pt will improve cog TUG to less than or equal to 50 seconds w/LRAD for improved functional mobility and decreased fall risk. Baseline: 68.35s on 10/10 Goal status: INITIAL  3.   Pt will improve gait velocity to at least 2.5 ft/s w/LRAD for improved gait efficiency and safety Baseline: 2.12 ft/s w/RW; 1.72 ft/s w/RW Goal status:  INITIAL   ASSESSMENT:  CLINICAL IMPRESSION: Emphasis of skilled PT session on LTG assessment and turns practice. Pt has not met any LTGs this date, goal date extended due to delay in scheduling in POC. Pt continues to have significant difficulty w/turns and initiating gait when coming to stand. Pt's gait speed overall reduced today compared to eval, contributing to his decline in TUG score as well. Will send recert today to finish out POC and continue working on gait kinematics, speed and turns w/RW. Continue POC.    OBJECTIVE IMPAIRMENTS Abnormal gait, decreased activity tolerance, decreased balance, decreased cognition, decreased coordination, decreased endurance, decreased knowledge of condition, decreased knowledge of use of DME, decreased mobility, difficulty walking, decreased safety awareness, impaired perceived functional ability, and pain.   ACTIVITY LIMITATIONS carrying, lifting, bending, sitting, standing, squatting, stairs, transfers, bed mobility, reach over head, locomotion level, and caring for others  PARTICIPATION LIMITATIONS: meal prep, cleaning, laundry, medication management, personal finances, interpersonal relationship, driving, shopping, community activity, occupation, yard work, and church  PERSONAL FACTORS Age, Behavior pattern, Fitness, Transportation, and 1 comorbidity: polymyositis  are also affecting patient's functional outcome.   REHAB POTENTIAL: Fair due to impaired cognition, lack of family support and poor compliance   CLINICAL DECISION MAKING: Evolving/moderate complexity  EVALUATION COMPLEXITY: Moderate  PLAN: PT FREQUENCY: 1x/week  PT DURATION: 8 weeks  PLANNED INTERVENTIONS: Therapeutic exercises, Therapeutic activity, Neuromuscular re-education, Balance training, Gait training, Patient/Family education, Self Care, Stair training, DME instructions, Manual therapy, and Re-evaluation  PLAN FOR NEXT SESSION:  pt education regarding safety at home,  turns practice, sit <>stands, scifit for endurance and UE/LE  coordination, increased step length, postural control. Work on turns and gait speed    [employer], PT, DPT 01/27/2022, 11:57 AM

## 2022-02-03 ENCOUNTER — Encounter: Payer: Self-pay | Admitting: Physical Therapy

## 2022-02-03 ENCOUNTER — Ambulatory Visit: Payer: Medicare Other | Admitting: Occupational Therapy

## 2022-02-03 ENCOUNTER — Ambulatory Visit: Payer: Medicare Other | Admitting: Speech Pathology

## 2022-02-03 ENCOUNTER — Ambulatory Visit: Payer: Medicare Other | Admitting: Physical Therapy

## 2022-02-03 ENCOUNTER — Encounter: Payer: Self-pay | Admitting: Occupational Therapy

## 2022-02-03 DIAGNOSIS — R293 Abnormal posture: Secondary | ICD-10-CM | POA: Diagnosis not present

## 2022-02-03 DIAGNOSIS — R2681 Unsteadiness on feet: Secondary | ICD-10-CM

## 2022-02-03 DIAGNOSIS — R29818 Other symptoms and signs involving the nervous system: Secondary | ICD-10-CM | POA: Diagnosis not present

## 2022-02-03 DIAGNOSIS — R41840 Attention and concentration deficit: Secondary | ICD-10-CM

## 2022-02-03 DIAGNOSIS — R278 Other lack of coordination: Secondary | ICD-10-CM

## 2022-02-03 DIAGNOSIS — R471 Dysarthria and anarthria: Secondary | ICD-10-CM

## 2022-02-03 DIAGNOSIS — M25622 Stiffness of left elbow, not elsewhere classified: Secondary | ICD-10-CM

## 2022-02-03 DIAGNOSIS — M25621 Stiffness of right elbow, not elsewhere classified: Secondary | ICD-10-CM

## 2022-02-03 DIAGNOSIS — R251 Tremor, unspecified: Secondary | ICD-10-CM

## 2022-02-03 DIAGNOSIS — M25612 Stiffness of left shoulder, not elsewhere classified: Secondary | ICD-10-CM

## 2022-02-03 DIAGNOSIS — R41841 Cognitive communication deficit: Secondary | ICD-10-CM | POA: Diagnosis not present

## 2022-02-03 DIAGNOSIS — R29898 Other symptoms and signs involving the musculoskeletal system: Secondary | ICD-10-CM

## 2022-02-03 DIAGNOSIS — M25611 Stiffness of right shoulder, not elsewhere classified: Secondary | ICD-10-CM

## 2022-02-03 NOTE — Therapy (Signed)
OUTPATIENT OCCUPATIONAL THERAPY PARKINSON'S TREATMENT  Patient Name: Alexander Thomas MRN: [id number] DOB:[birth year], 76 y.o., male Today's Date: 02/03/2022  PCP: Janie Morning, DO REFERRING PROVIDER: Dr. Marcial Pacas   OT End of Session - 02/03/22 1033     Visit Number 6    Number of Visits 13    Date for OT Re-Evaluation 02/23/22    Authorization Type Medicare & [place] (BCBS will cover remaining from [facility] Medicare guidelines    Authorization - Visit Number 6    Authorization - Number of Visits 10    Progress Note Due on Visit 10    OT Start Time 1022    OT Stop Time 1100    OT Time Calculation (min) 38 min    Activity Tolerance Patient tolerated treatment well    Behavior During Therapy WFL for tasks assessed/performed                Past Medical History:  Diagnosis Date   Hypercholesteremia    Migraine    Polymyositis ([place])    Raynaud disease    Tremor of right hand    Past Surgical History:  Procedure Laterality Date   CARPAL TUNNEL RELEASE Left    INGUINAL HERNIA REPAIR Bilateral    WRIST SURGERY Right    Patient Active Problem List   Diagnosis Date Noted   Acne 06/10/2021   Elevated PSA 06/10/2021   Osteoarthritis 06/10/2021   Osteoporosis 06/10/2021   Postherpetic neuralgia 06/10/2021   Raynaud's disease 06/10/2021   Rosacea 06/10/2021   Gait abnormality 08/29/2019   Midline low back pain without sciatica 08/29/2019   Mild cognitive impairment 08/29/2019   Excessive daytime sleepiness 01/24/2019   Nocturia more than twice per night 01/24/2019   Hypersomnia with sleep apnea 01/24/2019   At risk for central sleep apnea 01/24/2019   RLS (restless legs syndrome) 01/24/2019   PAF (paroxysmal atrial fibrillation) ([place]) 06/04/2015   Idiopathic Parkinson's disease 05/07/2015   Polymyositis ([place]) 05/07/2015    ONSET DATE: 10/27/21 (MD referral)  REFERRING DIAG: Parkinson's Disease  THERAPY DIAG:  Other lack of coordination  Other  symptoms and signs involving the nervous system  Stiffness of left elbow, not elsewhere classified  Tremor  Stiffness of right elbow, not elsewhere classified  Stiffness of left shoulder, not elsewhere classified  Stiffness of right shoulder, not elsewhere classified  Attention and concentration deficit  Other symptoms and signs involving the musculoskeletal system  Unsteadiness on feet  Abnormal posture  Rationale for Evaluation and Treatment Rehabilitation  SUBJECTIVE:   SUBJECTIVE STATEMENT: I'm moving better   Pt accompanied by: self  PERTINENT HISTORY: Parkinson's Disease   PMH:  hx of polymyositis, hx of orthostatic tachycardia, memory, Raynaud's disease, L CTR, R wrist fx surgery, L hip fracture 2/22 from fall, hx of L elbow fx due to fall 2/22 (surgery x2), sleep apnea, restless leg syndrome, hx of back pain, mild cognitive impairment.  PRECAUTIONS: Fall  WEIGHT BEARING RESTRICTIONS No  PAIN:  Are you having pain? Yes: NPRS scale: 2/10 Pain location: L hip, sometimes knee Pain description: sharp-knee, dull sometimes sharp-hip Aggravating factors: unknown Relieving factors: sitting  FALLS: Has patient fallen in last 6 months? Yes. Number of falls "slipped a time or 2 in the yard"--able to to get up himself  LIVING ENVIRONMENT: Lives with: lives with their family and lives with their spouse Lives in: House/apartment Has following equipment at home: Single point cane, Environmental consultant - 2 wheeled, Electronics engineer, and Grab bars  PLOF:  Independent, Vocation/Vocational requirements: retired, but continues preach on Sundays for [employer], and Leisure: has enjoyed Medical illustrator, gardening, carpentry  PATIENT GOALS  improve movement  HAND DOMINANCE: Right  OBJECTIVE:   TODAY'S TREATMENT:   Practiced fastening/unfastening buttons along tabletop with min cueing for use of PWR! Hands/strategies.    Arm bike x53mn level 1 for reciprocal movement with  min cues/target of at least 30rpms for intensity while maintaining movement amplitude/reciprocal movement.  Pt maintained 19-23rpms  Practiced supine>sitting with use of large amplitude movement strategies with min cueing and good success.      PATIENT EDUCATION: Education details: Supine PWR! Moves (basic 4), supine closed-chain shoulder flex  Person educated: Patient Education method: Explanation, Demonstration, Tactile cues, and Verbal cues--min-mod cueing for large amplitude movements Education comprehension: verbalized understanding, returned demonstration, and verbal cues required    [place] 12/16/21:  PWR! Hands (basic 4) 12/29/21:  Review of prior Coordination HEP and supine closed-chain shoulder flex and chest press.  Organized therapy folder, recommended walker bag/tray 01/20/22:  reviewed strategy for donning/doffing jacket. 01/27/22:  Reviewed ways to decr risk of complications related to PD; reviewed PWR! Up, rock, twist in modified quadruped 02/03/22:  Reviewed supine PWR! Moves (basic 4)   ASSESSMENT:  CLINICAL IMPRESSION: Pt continues to progress slowly towards goals with improving amplitude.  However, pt benefits from verbal, tactile, and visual cueing with repetition for incr carryover.  PERFORMANCE DEFICITS in functional skills including ADLs, IADLs, coordination, dexterity, tone, ROM, FMC, GMC, mobility, balance, endurance, decreased knowledge of precautions, decreased knowledge of use of DME, and UE functional use, cognitive skills including memory, and psychosocial skills including environmental adaptation and habits.   IMPAIRMENTS are limiting patient from ADLs, IADLs, work, and leisure.   COMORBIDITIES may have co-morbidities  that affects occupational performance. Patient will benefit from skilled OT to address above impairments and improve overall function.  MODIFICATION OR ASSISTANCE TO COMPLETE EVALUATION: Min-Moderate modification of tasks or  assist with assess necessary to complete an evaluation.  OT OCCUPATIONAL PROFILE AND HISTORY: Detailed assessment: Review of records and additional review of physical, cognitive, psychosocial history related to current functional performance.  CLINICAL DECISION MAKING: Moderate - several treatment options, min-mod task modification necessary  REHAB POTENTIAL: Good  EVALUATION COMPLEXITY: Moderate    GOALS: Potential Goals reviewed with patient? Yes  SHORT TERM GOALS: Target date: 12/23/2021, 01/22/22  Pt will be independent with updated PD-specific HEP. Goal status: IN PROGRESS  2.  Pt will verbalize understanding of ways to decr risk of complications related to PD. Goal status: MET  01/27/22 Reviewed  3.  Pt will improve RUE functional reaching and coordination for ADLs as shown by improving score on box and blocks test by at least 3 with RUE. Baseline:  30 blocks Goal status: MET  01/27/22:  33 blocks  4.  Pt will improve RUE functional reaching and coordination for ADLs as shown by improving score on box and blocks test by at least 3 with LUE. Baseline:  23 blocks Goal status: MET  01/27/22  30 blocks  5.  Pt will demo at least 120* shoulder flex with at least -40* elbow ext with RUE for overhead reach. Baseline:  120* with -50* elbow ext Goal status: MET  01/27/22:  120* with -20* elbow ext    LONG TERM GOALS: Target date: 11/6//2023     Pt will verbalize understanding of adaptive strategies/AE to incr safety/ease with ADLs/IADLs. Goal status: IN PROGRESS  2.  Pt  will improve RUE functional reaching and coordination for ADLs as shown by improving time on 9-hole peg test by at least 3sec with RUE. Baseline:  44.31sec Goal status: INITIAL  3.  Pt will improve bilatateral coordination and incr ease with dressing as shown by fastening/unfastening 3 buttons in less than 60sec. Baseline: 72.32sec Goal status: INITIAL  4.  Pt will improve standing functional reach by at  least 2 inches with RUE.  Baseline:  R-4", L-7" Goal status: INITIAL  5.  Assess PPT#4 (donning/doffing jacket) and establish goal as appropriate.   Goal status: Assessed--defer goal (pt able to demo strategies after review).     PLAN: OT FREQUENCY: 1x/week at pt request  OT DURATION: 12 weeks +eval (may d/c after 8 weeks, depending on progress)  PLANNED INTERVENTIONS: self care/ADL training, therapeutic exercise, therapeutic activity, neuromuscular re-education, passive range of motion, gait training, balance training, functional mobility training, ultrasound, moist heat, cryotherapy, patient/family education, cognitive remediation/compensation, and DME and/or AE instructions  RECOMMENDED OTHER SERVICES: current with PT, ST  CONSULTED AND AGREED WITH PLAN OF CARE: Patient  PLAN FOR NEXT SESSION:  reinforce PWR! Moves in supine, dynamic functional reach, reinforce ADL strategies   Emeli Goguen, OTR/L 02/03/2022, 10:58 AM

## 2022-02-03 NOTE — Therapy (Signed)
OUTPATIENT PHYSICAL THERAPY NEURO TREATMENT   Patient Name: Alexander Thomas MRN: [id number] DOB:04/12/1946, 76 y.o., male Today's Date: 02/03/2022   PCP: Janie Morning, DO REFERRING PROVIDER: Marcial Pacas, MD    PT End of Session - 02/03/22 [phone]     Visit Number 7    Number of Visits 9   Plus eval   Date for PT Re-Evaluation 11/19/21   Recert   Authorization Type Medicare- part A & B    PT Start Time 3612   PT running late   PT Stop Time 1015    PT Time Calculation (min) 40 min    Equipment Utilized During Treatment Gait belt    Activity Tolerance Patient tolerated treatment well    Behavior During Therapy WFL for tasks assessed/performed                Past Medical History:  Diagnosis Date   Hypercholesteremia    Migraine    Polymyositis ([place])    Raynaud disease    Tremor of right hand    Past Surgical History:  Procedure Laterality Date   CARPAL TUNNEL RELEASE Left    INGUINAL HERNIA REPAIR Bilateral    WRIST SURGERY Right    Patient Active Problem List   Diagnosis Date Noted   Acne 06/10/2021   Elevated PSA 06/10/2021   Osteoarthritis 06/10/2021   Osteoporosis 06/10/2021   Postherpetic neuralgia 06/10/2021   Raynaud's disease 06/10/2021   Rosacea 06/10/2021   Gait abnormality 08/29/2019   Midline low back pain without sciatica 08/29/2019   Mild cognitive impairment 08/29/2019   Excessive daytime sleepiness 01/24/2019   Nocturia more than twice per night 01/24/2019   Hypersomnia with sleep apnea 01/24/2019   At risk for central sleep apnea 01/24/2019   RLS (restless legs syndrome) 01/24/2019   PAF (paroxysmal atrial fibrillation) ([place]) 06/04/2015   Idiopathic Parkinson's disease 05/07/2015   Polymyositis ([place]) 05/07/2015    ONSET DATE: 10/27/2021   REFERRING DIAG: G20 (ICD-10-CM) - Idiopathic Parkinson's disease ([place])   THERAPY DIAG:  Other lack of coordination  Other symptoms and signs involving the nervous system  Unsteadiness on  feet  Abnormal posture  Rationale for Evaluation and Treatment Rehabilitation  SUBJECTIVE:                                                                                                                                                                                              SUBJECTIVE STATEMENT: Working on some exercises. No falls. Feels like his hip and knee are getting better. Has tried the turning exercise at home.   Pt accompanied by: self  PERTINENT HISTORY:  hyperlipidemia, Raynaud's disease, had left carpal tunnel release surgery, right wrist fracture surgery, polymyositis,  left deltoid muscle biopsy consistent with fiber atrophy, perivasculitis in August 2002  PAIN:  Are you having pain? Yes: NPRS scale: 2/10 Pain location: L hip and knee, L elbow Pain description: Sharp Aggravating factors: "I do not think so" Relieving factors: "takin it easy"  PRECAUTIONS: Fall  PLOF: Requires assistive device for independence, Needs assistance with ADLs, and Needs assistance with homemaking  PATIENT GOALS "I wanna get to where I do not have to depend on the cane"   OBJECTIVE:   TODAY'S TREATMENT:  Ther Ex  SciFit multi-peaks level 3 > 2 (due to pt's movements getting smaller) for 8 minutes using BUE/BLEs for ROM, reciprocal movement, dynamic cardiovascular warmup and increased amplitude of stepping. Cued to work towards a 7/10 level of effort level. Cues intermittently to maintain knee extension ROM. Pt reporting effort level as a 6-7/10 afterwards.    NMR Reviewed quarter turn exercise given as HEP last session, pt initially going to demo to therapist how he performed at home and performing with very small steps when turning and having a narrow BOS. Pt reporting effort level as only as 2-3/10, instead performed with single UE only and cues to take a big step out laterally (to 3 or 9:00 like faces on a clock) with larger amplitude movements to help initiate a turn, performed x12  reps each side. Discussed for safety at home to just perform stepping one foot out at this time. When going to turn in // bars, pt needing verbal cues to use this turning technique to initiate a turn, pt initially taking a smaller step and unable to fully turn. Cued pt to reset back in midline and try again with larger amplitude with pt then able to perform.  Forward and then right into posterior step and weight shift to colorful floor dots with BUE support for weight shift/stepping strategy x15 reps each side.    GAIT: Gait pattern: step through pattern, decreased step length- Right, decreased step length- Left, decreased stride length, decreased hip/knee flexion- Right, decreased hip/knee flexion- Left, decreased ankle dorsiflexion- Right, decreased ankle dorsiflexion- Left, Right foot flat, Left foot flat, shuffling, trunk flexed, narrow BOS, poor foot clearance- Right, and poor foot clearance- Left Distance walked: 230' x 1, plus additional distances.  Assistive device utilized: Environmental consultant - 2 wheeled Level of assistance: SBA and CGA Comments: Cued for gait with posture (looking ahead in front of him) and incr stride length bilat. When turning, cued to stay inside RW and to pick up legs in a march for improved foot clearance. Trialed x2 laps with use of blue tband at bottom of RW for visual cue to step towards for incr stride length. Pt did not respond well to this and no change in pt's stride length with use of visual cue.   Provided pt with new tennis balls for RW.    PATIENT EDUCATION: Education details: Reviewed turns addition to HEP. Importance of large amplitude movements in everyday life.  Person educated: Patient Education method: Explanation, Demonstration, and Verbal cues Education comprehension: verbalized understanding, verbal cues required, and needs further education   HOME EXERCISE PROGRAM: Seated PWR moves, Sit to stands Access Code: [email or web address] URL:  [email or web address] Date: 01/27/2022 Prepared by: Mickie Bail Plaster  Exercises - Alternating Step Backward with Support  - 1-2 x daily - 5 x weekly - 1-2 sets - 10 reps - Side to Side Weight Shift  with Overhead Reach and Counter Support  - 1-2 x daily - 5 x weekly - 1-2 sets - 10 reps - Standing Quarter Turn with Counter Support  - 1 x daily - 7 x weekly - 3 sets - 10 reps  GOALS: Goals reviewed with patient? Yes  SHORT TERM GOALS: Target date: 12/23/2021  Pt will perform initial HEP with min A from wife for improved strength, balance, transfers and gait.  Baseline: need to review HEP from previous bout of therapy; pt reports he does his HEP alone  Goal status: IN PROGRESS  2.  Pt will improve normal TUG to less than or equal to 25 seconds w/LRAD for improved functional mobility and decreased fall risk.  Baseline: 30.09s w/RW, significant shuffling w/turns; 37.5s w/RW and increased shuffling w/turns  Goal status: NOT MET  3.  Pt will improve 5 x STS to less than or equal to 20 seconds without UE support to demonstrate improved functional strength and transfer efficiency.   Baseline: 25.59s w/UE support on reps 1&2 due to retropulsion; 22.07s w/BUE support on first rep, no retropulsion noted  Goal status: IN PROGRESS  4.  Pt will verbalize fall prevention strategies in the home and community for reduced fall risk  Baseline:  Goal status: NOT MET  5.  Floor transfer will be performed to assess safety at home w/performing exercises on floor   Baseline:   Goal Status: MET   UPDATED LONG TERM GOALS DUE TO EXTENDED POC:  Target date: 02/17/2022  Pt will perform final HEP with min A from wife for improved strength, balance, transfers and gait. Baseline:  Goal status: INITIAL  2.  Pt will improve cog TUG to less than or equal to 50 seconds w/LRAD for improved functional mobility and decreased fall risk. Baseline: 68.35s on 10/10 Goal status: INITIAL  3.   Pt will  improve gait velocity to at least 2.5 ft/s w/LRAD for improved gait efficiency and safety Baseline: 2.12 ft/s w/RW; 1.72 ft/s w/RW Goal status: INITIAL   ASSESSMENT:  CLINICAL IMPRESSION: Reviewed quarter turn exercise to HEP with pt performing with both legs and performing with small steps and very narrow BOS despite cues for larger amplitude. Changed it for now to just perform with single leg to initiate a big step out laterally to initiate a turn (to 3 or 9:00) like faces on a clock. Pt seemed to do better with just one step, but does need cues throughout for larger amplitude. Trialed use of blue tband at bottom of RW to see if it helped incr stride length as a visual cue, no change noted so discontinued band. Will continue to progress towards LTGs.    OBJECTIVE IMPAIRMENTS Abnormal gait, decreased activity tolerance, decreased balance, decreased cognition, decreased coordination, decreased endurance, decreased knowledge of condition, decreased knowledge of use of DME, decreased mobility, difficulty walking, decreased safety awareness, impaired perceived functional ability, and pain.   ACTIVITY LIMITATIONS carrying, lifting, bending, sitting, standing, squatting, stairs, transfers, bed mobility, reach over head, locomotion level, and caring for others  PARTICIPATION LIMITATIONS: meal prep, cleaning, laundry, medication management, personal finances, interpersonal relationship, driving, shopping, community activity, occupation, yard work, and church  PERSONAL FACTORS Age, Behavior pattern, Fitness, Transportation, and 1 comorbidity: polymyositis  are also affecting patient's functional outcome.   REHAB POTENTIAL: Fair due to impaired cognition, lack of family support and poor compliance   CLINICAL DECISION MAKING: Evolving/moderate complexity  EVALUATION COMPLEXITY: Moderate  PLAN: PT FREQUENCY: 1x/week  PT DURATION: 8 weeks  PLANNED INTERVENTIONS: Therapeutic exercises, Therapeutic  activity, Neuromuscular re-education, Balance training, Gait training, Patient/Family education, Self Care, Stair training, DME instructions, Manual therapy, and Re-evaluation  PLAN FOR NEXT SESSION:  continue pt education regarding safety at home, turns practice and gait speed , sit <>stands, scifit for endurance and UE/LE coordination, increased step length, postural control.    Arliss Journey, PT, DPT 02/03/2022, 10:16 AM

## 2022-02-03 NOTE — Therapy (Signed)
OUTPATIENT SPEECH LANGUAGE PATHOLOGY TREATMENT   Patient Name: Alexander Thomas MRN: [id number] DOB:06/08/1945, 76 y.o., male Today's Date: 02/03/2022  PCP: Janie Morning, DO REFERRING PROVIDER: Marcial Pacas, MD    End of Session - 02/03/22 1105     Visit Number 5    Number of Visits 13    Date for SLP Re-Evaluation 02/20/22    Authorization Type Medicare    Progress Note Due on Visit 10    SLP Start Time 1102    SLP Stop Time  1140    SLP Time Calculation (min) 38 min    Activity Tolerance Patient tolerated treatment well                 Past Medical History:  Diagnosis Date   Hypercholesteremia    Migraine    Polymyositis ([place])    Raynaud disease    Tremor of right hand    Past Surgical History:  Procedure Laterality Date   CARPAL TUNNEL RELEASE Left    INGUINAL HERNIA REPAIR Bilateral    WRIST SURGERY Right    Patient Active Problem List   Diagnosis Date Noted   Acne 06/10/2021   Elevated PSA 06/10/2021   Osteoarthritis 06/10/2021   Osteoporosis 06/10/2021   Postherpetic neuralgia 06/10/2021   Raynaud's disease 06/10/2021   Rosacea 06/10/2021   Gait abnormality 08/29/2019   Midline low back pain without sciatica 08/29/2019   Mild cognitive impairment 08/29/2019   Excessive daytime sleepiness 01/24/2019   Nocturia more than twice per night 01/24/2019   Hypersomnia with sleep apnea 01/24/2019   At risk for central sleep apnea 01/24/2019   RLS (restless legs syndrome) 01/24/2019   PAF (paroxysmal atrial fibrillation) ([place]) 06/04/2015   Idiopathic Parkinson's disease 05/07/2015   Polymyositis ([place]) 05/07/2015    ONSET DATE: PD dx 2016; 10/27/2021 (referral date)  REFERRING DIAG: G20 (ICD-10-CM) - Idiopathic Parkinson's disease   THERAPY DIAG:  Dysarthria and anarthria  Cognitive communication deficit  Rationale for Evaluation and Treatment Rehabilitation  SUBJECTIVE:   SUBJECTIVE STATEMENT: "Probably not" re: HEP, tells SLP he's been  doing other things such as walking dog and cleaning out garage   PAIN:  Are you having pain?No, reports did have hip pain but has resolved   OBJECTIVE:    TODAY'S TREATMENT:  02-03-22: Pt reports to feeling like voice and speech are within functional limits for his needs. Is not completing HEP. Reports to doing vocal warm up prior to Sunday services with wife providing feedback. SLP educates on reduced volume but also requirement for pt to have personal buy in for speech therapy to be efficacious. Discussion on optimizing outcomes by pt determining goal Pt reports concern re: saliva management with increased drooling observed. SLP provides education on saliva management strategies. Cued intentional swallow every 60 seconds initiated with use of visual timer to aid in completion of intentional swallows, over 15 minute period, with no anterior loss of saliva observed throughout structured practice. Education on additional strategy of chewing gum may be beneficial. Targeted use of intent to alleviate hoarseness during loud, sustained "ah" with max-A, achieved improved vocal quality in 50% of trials. Introduced straw phonation exercises to aid in reduction of hoarseness which may be related to vocal fold atrophy related to normal aging. Max-A with usual demonstration to complete sustained vowel phonation with biofeedback of bubbles in 70% of trials. Following intermittent clearing of hoarseness with use of intent and change in pitch. Unable to determine if hoarseness is resultant of hypokinetic  dysarthria of other etiology d/t no imaging having been completed and inconsistent clearing with use of cued strategies.   01-20-22: Pt enters with sub-optimal volume, averaging 65 dB, and usual hoarseness. With verbal cues, pt able to increase intensity, resulting in average of 72 dB in automatic speech tasks. With cued use of intent, hoarseness clears. Pt reports he has not done HEP since last visit. SLP leads pt  through [place] [facility], providing usual modeling prior to pt execution and usual faded to rare min-A for accuracy in completion. Emerging awareness of hoarseness with intermittent ability to correct. Averages as follows: Loud, sustained "ah": 90 dB Counting: 82 dB Reading (phrases): 79 dB Cognitive exercise: 75 dB Usual verbal cues, including recasting of utterances required for carryover of intentional speech through entirety of session. Re-education of HEP, provided visual aid to aid in completion of exercises per established schedule. Following, pt able to find visual aid and recall lesson to complete tomorrow with supervision-A.   12-29-21: Pt reports wife less frequently requesting for repetition, pt tells SLP "I crank it up a bit." Pt has not been completing HEP, agreeable to begin implementing this date. SLP provide written instructions to aid in completion. SLP leads pt through [place] [facility], providing usual modeling prior to pt execution and usual faded to rare min-A for accuracy in completion. Averages as follows: Loud, sustained "ah": 81 dB Counting: 75 dB Reading (phrases): 76 dB Cognitive exercise: 67 dB Improved volume noted in conversation, averages 70 dB, likely 2/2 cognitive component. Hoarseness present, clears through intentional speech. Usual cues required, to include recasting, for carryover of intentional speech to side comments. As session progresses,  pt self-correcting intermittently. ID using "preacher" voice as strategy to aid in carryover of intentional speech.   12-16-21: Pt enters with sub-optimal volume and usual hoarseness. ID key word which will assist pt in using intent  Target improving vocal quality and increasing intensity through progressively difficulty speech tasks using Speak Out! program, lesson 1. ST leads pt through exercises providing usual model prior to pt execution. Usual mod-A required to achieve target dB this date. Averages this date: loud "ah"  91 dB; reading (phrases) 78 dB; cognitive speech task 74 dB. Pt's hoarseness noted to clear with use of intentional speech. Conversational sample of approx 10 minutes, pt averages 67 dB with usual max-A, to include visual and verbal cueing and recasting. Difficulty using intent when attempting to organize thoughts or recall details in story retell.   11-25-21: Discussed any possible changes in communication, cognition, and swallow function. Pt reports some minimal changes in voice and memory since last course of ST intervention, although hoarseness notably worse (conversational volume was sufficient today). SLP educated patient on recommendation to address vocal quality and possibly memory changes, in which pt agreeable. SLP assisted with ordering Speak Out! Workbook today.    PATIENT EDUCATION: Education details: see above Person educated: Patient Education method: Customer service manager Education comprehension: verbalized understanding, returned demonstration, and needs further education   HOME EXERCISE PROGRAM: Speak Out! Program   GOALS: Goals reviewed with patient? Yes  SHORT TERM GOALS: Target date: 01/20/2022  (extended d/t scheduling issues resulting in pt only having 1 visit scheduled)  Pt will complete Speak Out! HEP at least 1x/day given occasional min A over 2 sessions  Baseline: Goal status: NOT MET  2.  Pt will achieve targeted dB level and clear vocal quality in demonstration of Speak Out! Lessons with 80% accuracy given occasional mod  A over 2 sessions Baseline:  Goal status: MET  3.  Pt will utilize dysarthria compensations to optimize vocal intensity and clarity in 5-10 minute conversation given occasional mod A over 2 sessions Baseline:  Goal status: NOT MET  4. Pt will utilize cognitive compensations to aid recall of pertinent information (as needed) given occasional mod A over 2 sessions Baseline:  Goal status: NOT MET  5.  Pt will implement saliva  management strategies to reduce drooling given occasional mod A over 2 sessions  Baseline:  Goal status: MET   LONG TERM GOALS: Target date: 02/20/2022  Pt will complete Speak Out! HEP at least 1x/day (BID recommended) over 1 week  Baseline:  Goal status: IN PROGRESS  2.  Pt will achieve targeted dB levels and clear vocal quality in demonstration of Speak Out! Lessons with 90% accuracy given occasional min A over 2 sessions Baseline:  Goal status: IN PROGRESS  3.  Pt will utilize dysarthria compensations to optimize vocal intensity and clarity in 20+ minute conversation given occasional min A over 2 sessions Baseline:  Goal status: IN PROGRESS  4.  Pt will utilize cognitive compensations to aid recall of pertinent information (as needed) given occasional min A over 2 sessions Baseline:  Goal status: IN PROGRESS  5.  Pt will report improved communication effectiveness via PROM by 2 points at last ST session  Baseline: V-RQOL=15 Goal status: IN PROGRESS   ASSESSMENT:  CLINICAL IMPRESSION: Patient is a 76 y.o. male who was seen today for Parkinson's Disease. Pt participated in OP ST intervention targeting dysarthria from August 2022 to January 2023 to address reduced volume and hoarseness. Inconsistent attendance exhibited related to scheduling, but good progress and carryover of trained techniques demonstrated and reported (benefited when wife attended sessions). Today, pt presents with low 70s dB conversational volume with intermittent increasing to persistent hoarseness. 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 Pt continues to practice sustained phonation in car to church and wife cues if volume dropping during sermon. Pt stated "sometimes I'm squeaky" if he talks early in day. Limited awareness or concern for hoarseness exhibited at this time, although, hoarseness would likely impact his speech intelligibility while preaching. Endorsed some mild memory changes but  unable to provide specifics. Pt appeared to explain away deficits related to age and wife experiencing similar symptoms. Ongoing drooling exhibited and reported. Pt would benefit from skilled ST intervention to maximize communication effectiveness, optimize vocal quality, and increase functional independence.   OBJECTIVE IMPAIRMENTS  Objective impairments include memory, awareness, and dysarthria. These impairments are limiting patient from household responsibilities and effectively communicating at home and in community. Factors affecting potential to achieve goals and functional outcome are cooperation/participation level and medical prognosis. Patient will benefit from skilled SLP services to address above impairments and improve overall function.  REHAB POTENTIAL: Fair - awareness and independent carryover  PLAN: SLP FREQUENCY: 2x/week recommended but pt requested 1x/week due to transportation and limited availability  SLP DURATION: 12 weeks  PLANNED INTERVENTIONS: Language facilitation, Environmental controls, Cueing hierachy, Cognitive reorganization, Internal/external aids, Oral motor exercises, Functional tasks, Multimodal communication approach, SLP instruction and feedback, Compensatory strategies, and Patient/family education    Su Monks, [place] 02/03/2022, 11:05 AM

## 2022-02-03 NOTE — Patient Instructions (Addendum)
It is important that you practice if you want your voice and speech to sound better, with improved consistency.  Daily Practice:  Straw phonation: put a straw into a cup of water. Hold out "oooo" into straw until bubbles blow as long as you can with good breath support. Do this x10 times.  Sustained and loud "ahhhhh" with good breath support and good vocal quality x10 - remember you have to be INTENTIONAL (use your preacher voice)  Do a new speak out lesson every day-- from your workbook    Drooling:  You need to swallow more often! Practice swallowing on the minute for 10 minutes to train yourself to know how often you should be swallowing.  Try chewing gum, sometimes this helps you with swallowing your spit   Swallow before you speak.

## 2022-02-10 ENCOUNTER — Ambulatory Visit: Payer: Medicare Other | Admitting: Occupational Therapy

## 2022-02-10 ENCOUNTER — Ambulatory Visit: Payer: Medicare Other | Admitting: Physical Therapy

## 2022-02-10 ENCOUNTER — Ambulatory Visit: Payer: Medicare Other

## 2022-02-10 DIAGNOSIS — R278 Other lack of coordination: Secondary | ICD-10-CM

## 2022-02-10 DIAGNOSIS — R293 Abnormal posture: Secondary | ICD-10-CM

## 2022-02-10 DIAGNOSIS — R41841 Cognitive communication deficit: Secondary | ICD-10-CM

## 2022-02-10 DIAGNOSIS — R29818 Other symptoms and signs involving the nervous system: Secondary | ICD-10-CM

## 2022-02-10 DIAGNOSIS — M25612 Stiffness of left shoulder, not elsewhere classified: Secondary | ICD-10-CM

## 2022-02-10 DIAGNOSIS — R471 Dysarthria and anarthria: Secondary | ICD-10-CM

## 2022-02-10 DIAGNOSIS — M25611 Stiffness of right shoulder, not elsewhere classified: Secondary | ICD-10-CM

## 2022-02-10 DIAGNOSIS — M25622 Stiffness of left elbow, not elsewhere classified: Secondary | ICD-10-CM

## 2022-02-10 DIAGNOSIS — R251 Tremor, unspecified: Secondary | ICD-10-CM

## 2022-02-10 DIAGNOSIS — M25621 Stiffness of right elbow, not elsewhere classified: Secondary | ICD-10-CM

## 2022-02-10 DIAGNOSIS — R2681 Unsteadiness on feet: Secondary | ICD-10-CM

## 2022-02-10 DIAGNOSIS — R41840 Attention and concentration deficit: Secondary | ICD-10-CM

## 2022-02-10 DIAGNOSIS — R29898 Other symptoms and signs involving the musculoskeletal system: Secondary | ICD-10-CM

## 2022-02-10 NOTE — Therapy (Signed)
OUTPATIENT OCCUPATIONAL THERAPY PARKINSON'S TREATMENT  Patient Name: Alexander Thomas MRN: [id number] DOB:10/02/1945, 76 y.o., male Today's Date: 02/10/2022  PCP: Dana Collins, DO REFERRING PROVIDER: Dr. Yijun Yan   OT End of Session - 02/10/22 1216     Visit Number 7    Number of Visits 13    Date for OT Re-Evaluation 02/23/22    Authorization Type Medicare & BCBS (BCBS will cover remaining from Medicare)--follow Medicare guidelines    Authorization - Visit Number 7    Authorization - Number of Visits 10    Progress Note Due on Visit 10    OT Start Time 0934    OT Stop Time 1015    OT Time Calculation (min) 41 min                 Past Medical History:  Diagnosis Date   Hypercholesteremia    Migraine    Polymyositis (HCC)    Raynaud disease    Tremor of right hand    Past Surgical History:  Procedure Laterality Date   CARPAL TUNNEL RELEASE Left    INGUINAL HERNIA REPAIR Bilateral    WRIST SURGERY Right    Patient Active Problem List   Diagnosis Date Noted   Acne 06/10/2021   Elevated PSA 06/10/2021   Osteoarthritis 06/10/2021   Osteoporosis 06/10/2021   Postherpetic neuralgia 06/10/2021   Raynaud's disease 06/10/2021   Rosacea 06/10/2021   Gait abnormality 08/29/2019   Midline low back pain without sciatica 08/29/2019   Mild cognitive impairment 08/29/2019   Excessive daytime sleepiness 01/24/2019   Nocturia more than twice per night 01/24/2019   Hypersomnia with sleep apnea 01/24/2019   At risk for central sleep apnea 01/24/2019   RLS (restless legs syndrome) 01/24/2019   PAF (paroxysmal atrial fibrillation) (HCC) 06/04/2015   Idiopathic Parkinson's disease 05/07/2015   Polymyositis (HCC) 05/07/2015    ONSET DATE: 10/27/21 (MD referral)  REFERRING DIAG: Parkinson's Disease  THERAPY DIAG:  Other lack of coordination  Unsteadiness on feet  Abnormal posture  Other symptoms and signs involving the nervous system  Stiffness of left elbow,  not elsewhere classified  Tremor  Stiffness of right elbow, not elsewhere classified  Stiffness of left shoulder, not elsewhere classified  Stiffness of right shoulder, not elsewhere classified  Attention and concentration deficit  Other symptoms and signs involving the musculoskeletal system  Rationale for Evaluation and Treatment Rehabilitation  SUBJECTIVE:   SUBJECTIVE STATEMENT: Pt reports his elbow bothers him sometimes following old surgery  Pt accompanied by: self  PERTINENT HISTORY: Parkinson's Disease   PMH:  hx of polymyositis, hx of orthostatic tachycardia, memory, Raynaud's disease, L CTR, R wrist fx surgery, L hip fracture 2/22 from fall, hx of L elbow fx due to fall 2/22 (surgery x2), sleep apnea, restless leg syndrome, hx of back pain, mild cognitive impairment.  PRECAUTIONS: Fall  WEIGHT BEARING RESTRICTIONS No  PAIN:  Are you having pain? Yes: NPRS scale: 2/10 Pain location: L hip, sometimes elbow Pain description: sharp-knee, dull sometimes sharp-hip Aggravating factors: unknown Relieving factors: sitting  FALLS: Has patient fallen in last 6 months? Yes. 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 Moves supine basic 4 with hip lifts broken down separate from stepping, significant inreased time required and rest breaks, mod v.c  and demonstration. PATIENT EDUCATION: Education details: Supine PWR! Moves (basic 4), how to incorporate PWR! Moves into bed mobility Person educated: Patient Education method: Explanation, Demonstration, Tactile cues, and Verbal cues--min-mod cueing for large amplitude movements Education comprehension: verbalized understanding, returned demonstration, and verbal cues required    HOME EXERCISE PROGRAM: 12/16/21:  PWR! Hands (basic 4) 12/29/21:  Review of prior Coordination HEP and supine closed-chain shoulder flex and chest press.  Organized therapy folder, recommended walker bag/tray 01/20/22:  reviewed strategy for donning/doffing jacket. 01/27/22:  Reviewed ways to decr risk of complications related to PD; reviewed PWR! Up, rock, twist in modified quadruped 02/03/22:  Reviewed supine PWR! Moves (basic 4)   ASSESSMENT:  CLINICAL IMPRESSION: Pt continues to progress slowly towards goals with improving amplitude. Pt will benefit from reinforcement of big movement strategies with ADLS.  PERFORMANCE DEFICITS in functional skills including ADLs, IADLs, coordination, dexterity, tone, ROM, FMC, GMC, mobility, balance, endurance, decreased knowledge of precautions, decreased knowledge of use of DME, and UE functional use, cognitive skills including memory, and psychosocial skills including environmental adaptation and habits.   IMPAIRMENTS are limiting patient from ADLs, IADLs, work, and leisure.   COMORBIDITIES may have co-morbidities  that affects occupational performance. Patient will benefit from skilled OT to address above impairments and improve overall function.  MODIFICATION OR ASSISTANCE TO COMPLETE EVALUATION: Min-Moderate modification of tasks or assist with assess necessary to complete an evaluation.  OT OCCUPATIONAL PROFILE AND HISTORY: Detailed assessment: Review of records and additional review of physical, cognitive, psychosocial history related to current functional  performance.  CLINICAL DECISION MAKING: Moderate - several treatment options, min-mod task modification necessary  REHAB POTENTIAL: Good  EVALUATION COMPLEXITY: Moderate    GOALS: Potential Goals reviewed with patient? Yes  SHORT TERM GOALS: Target date: 12/23/2021, 01/22/22  Pt will be independent with updated PD-specific HEP. Goal status: IN PROGRESS  2.  Pt will verbalize understanding of ways to decr risk of complications related to PD. Goal status: MET  01/27/22 Reviewed  3.  Pt will improve RUE functional reaching and coordination for ADLs as shown by improving score on box and blocks test by at least 3 with RUE. Baseline:  30 blocks Goal status: MET  01/27/22:  33 blocks  4.  Pt will improve RUE functional reaching and coordination for ADLs as shown by improving score on box and blocks test by at least 3 with LUE. Baseline:  23 blocks Goal status: MET  01/27/22  30 blocks  5.  Pt will demo at least 120* shoulder flex with at least -40* elbow ext with RUE for overhead reach. Baseline:  120* with -50* elbow ext Goal status: MET  01/27/22:  120* with -20* elbow ext    LONG TERM GOALS: Target date: 11/6//2023     Pt will verbalize understanding of adaptive strategies/AE to incr safety/ease with ADLs/IADLs. Goal status: IN PROGRESS  2.  Pt will improve RUE functional reaching and coordination for ADLs as shown by improving time on 9-hole peg test by at least 3sec with RUE. Baseline:  44.31sec Goal status: INITIAL  3.  Pt will   improve bilatateral coordination and incr ease with dressing as shown by fastening/unfastening 3 buttons in less than 60sec. Baseline: 72.32sec Goal status: INITIAL  4.  Pt will improve standing functional reach by at least 2 inches with RUE.  Baseline:  R-4", L-7" Goal status: INITIAL  5.  Assess PPT#4 (donning/doffing jacket) and establish goal as appropriate.   Goal status: Assessed--defer goal (pt able to demo strategies after review).      PLAN: OT FREQUENCY: 1x/week at pt request  OT DURATION: 12 weeks +eval (may d/c after 8 weeks, depending on progress)  PLANNED INTERVENTIONS: self care/ADL training, therapeutic exercise, therapeutic activity, neuromuscular re-education, passive range of motion, gait training, balance training, functional mobility training, ultrasound, moist heat, cryotherapy, patient/family education, cognitive remediation/compensation, and DME and/or AE instructions  RECOMMENDED OTHER SERVICES: current with PT, ST  CONSULTED AND AGREED WITH PLAN OF CARE: Patient  PLAN FOR NEXT SESSION:  dynamic functional reach, reinforce ADL strategies   ,, OTR/L 02/10/2022, 12:17 PM 

## 2022-02-10 NOTE — Therapy (Signed)
OUTPATIENT SPEECH LANGUAGE PATHOLOGY TREATMENT   Patient Name: Alexander Thomas MRN: [id number] DOB:1945/11/11, 76 y.o., male Today's Date: 02/10/2022  PCP: Janie Morning, DO REFERRING PROVIDER: Marcial Pacas, MD    End of Session - 02/10/22 1151     Visit Number 6    Number of Visits 13    Date for SLP Re-Evaluation 02/20/22    SLP Start Time 1103    SLP Stop Time  1145    SLP Time Calculation (min) 42 min    Activity Tolerance Patient tolerated treatment well                  Past Medical History:  Diagnosis Date   Hypercholesteremia    Migraine    Polymyositis ([place])    Raynaud disease    Tremor of right hand    Past Surgical History:  Procedure Laterality Date   CARPAL TUNNEL RELEASE Left    INGUINAL HERNIA REPAIR Bilateral    WRIST SURGERY Right    Patient Active Problem List   Diagnosis Date Noted   Acne 06/10/2021   Elevated PSA 06/10/2021   Osteoarthritis 06/10/2021   Osteoporosis 06/10/2021   Postherpetic neuralgia 06/10/2021   Raynaud's disease 06/10/2021   Rosacea 06/10/2021   Gait abnormality 08/29/2019   Midline low back pain without sciatica 08/29/2019   Mild cognitive impairment 08/29/2019   Excessive daytime sleepiness 01/24/2019   Nocturia more than twice per night 01/24/2019   Hypersomnia with sleep apnea 01/24/2019   At risk for central sleep apnea 01/24/2019   RLS (restless legs syndrome) 01/24/2019   PAF (paroxysmal atrial fibrillation) ([place]) 06/04/2015   Idiopathic Parkinson's disease 05/07/2015   Polymyositis ([place]) 05/07/2015    ONSET DATE: PD dx 2016; 10/27/2021 (referral date)  REFERRING DIAG: G20 (ICD-10-CM) - Idiopathic Parkinson's disease   THERAPY DIAG:  Cognitive communication deficit  Dysarthria and anarthria  Rationale for Evaluation and Treatment Rehabilitation  SUBJECTIVE:   SUBJECTIVE STATEMENT: "Memory isn't going as well as, I have trouble remembering numbers. I have worked on [employer] some  recently. I am enjoying it."  PAIN:  Are you having pain?No, reports did have hip pain but has resolved   OBJECTIVE:    TODAY'S TREATMENT:  02-12-22: Pt reports completing HEP Speak Out 2-3x a week. SLP provided education to complete once a day if possible and make it apart of his daily routine. SLP leads pt through [place] 5, providing usual modeling prior to pt execution and usual faded to rare min-A for accuracy in completion. Pt demonstrated awareness of moderate hoarseness with intermittent ability to correct. Averages as follows: Loud, sustained "ah": 83 dB Counting: 80 dB Reading (phrases): 78 dB  Cognitive exercise: 74 dB Pt required mod A fade to min A verbal and visualization strategies to comprehend cognitive exercises. SLP educated pt on chunking strategies to aid in auditory comprehension and recall during cognitive exercise. SLP provided instruction to continue HEP in Speak out exercises each day.   02-03-22: Pt reports to feeling like voice and speech are within functional limits for his needs. Is not completing HEP. Reports to doing vocal warm up prior to Sunday services with wife providing feedback. SLP educates on reduced volume but also requirement for pt to have personal buy in for speech therapy to be efficacious. Discussion on optimizing outcomes by pt determining goal Pt reports concern re: saliva management with increased drooling observed. SLP provides education on saliva management strategies. Cued intentional swallow every 60  seconds initiated with use of visual timer to aid in completion of intentional swallows, over 15 minute period, with no anterior loss of saliva observed throughout structured practice. Education on additional strategy of chewing gum may be beneficial. Targeted use of intent to alleviate hoarseness during loud, sustained "ah" with max-A, achieved improved vocal quality in 50% of trials. Introduced straw phonation exercises to aid in reduction  of hoarseness which may be related to vocal fold atrophy related to normal aging. Max-A with usual demonstration to complete sustained vowel phonation with biofeedback of bubbles in 70% of trials. Following intermittent clearing of hoarseness with use of intent and change in pitch. Unable to determine if hoarseness is resultant of hypokinetic dysarthria of other etiology d/t no imaging having been completed and inconsistent clearing with use of cued strategies.   01-20-22: Pt enters with sub-optimal volume, averaging 65 dB, and usual hoarseness. With verbal cues, pt able to increase intensity, resulting in average of 72 dB in automatic speech tasks. With cued use of intent, hoarseness clears. Pt reports he has not done HEP since last visit. SLP leads pt through [place] 2, providing usual modeling prior to pt execution and usual faded to rare min-A for accuracy in completion. Emerging awareness of hoarseness with intermittent ability to correct. Averages as follows: Loud, sustained "ah": 90 dB Counting: 82 dB Reading (phrases): 79 dB Cognitive exercise: 75 dB Usual verbal cues, including recasting of utterances required for carryover of intentional speech through entirety of session. Re-education of HEP, provided visual aid to aid in completion of exercises per established schedule. Following, pt able to find visual aid and recall lesson to complete tomorrow with supervision-A.   12-29-21: Pt reports wife less frequently requesting for repetition, pt tells SLP "I crank it up a bit." Pt has not been completing HEP, agreeable to begin implementing this date. SLP provide written instructions to aid in completion. SLP leads pt through [place] 2, providing usual modeling prior to pt execution and usual faded to rare min-A for accuracy in completion. Averages as follows: Loud, sustained "ah": 81 dB Counting: 75 dB Reading (phrases): 76 dB Cognitive exercise: 67 dB Improved volume noted in  conversation, averages 70 dB, likely 2/2 cognitive component. Hoarseness present, clears through intentional speech. Usual cues required, to include recasting, for carryover of intentional speech to side comments. As session progresses,  pt self-correcting intermittently. ID using "preacher" voice as strategy to aid in carryover of intentional speech.   12-16-21: Pt enters with sub-optimal volume and usual hoarseness. ID key word which will assist pt in using intent  Target improving vocal quality and increasing intensity through progressively difficulty speech tasks using Speak Out! program, lesson 1. ST leads pt through exercises providing usual model prior to pt execution. Usual mod-A required to achieve target dB this date. Averages this date: loud "ah" 91 dB; reading (phrases) 78 dB; cognitive speech task 74 dB. Pt's hoarseness noted to clear with use of intentional speech. Conversational sample of approx 10 minutes, pt averages 67 dB with usual max-A, to include visual and verbal cueing and recasting. Difficulty using intent when attempting to organize thoughts or recall details in story retell.   11-25-21: Discussed any possible changes in communication, cognition, and swallow function. Pt reports some minimal changes in voice and memory since last course of ST intervention, although hoarseness notably worse (conversational volume was sufficient today). SLP educated patient on recommendation to address vocal quality and possibly memory changes, in which pt  agreeable. SLP assisted with ordering Speak Out! Workbook today.    PATIENT EDUCATION: Education details: see above Person educated: Patient Education method: Customer service manager Education comprehension: verbalized understanding, returned demonstration, and needs further education   HOME EXERCISE PROGRAM: Speak Out! Program   GOALS: Goals reviewed with patient? Yes  SHORT TERM GOALS: Target date: 01/20/2022  (extended d/t  scheduling issues resulting in pt only having 1 visit scheduled)  Pt will complete Speak Out! HEP at least 1x/day given occasional min A over 2 sessions  Baseline: Goal status: NOT MET  2.  Pt will achieve targeted dB level and clear vocal quality in demonstration of Speak Out! Lessons with 80% accuracy given occasional mod A over 2 sessions Baseline:  Goal status: MET  3.  Pt will utilize dysarthria compensations to optimize vocal intensity and clarity in 5-10 minute conversation given occasional mod A over 2 sessions Baseline:  Goal status: NOT MET  4. Pt will utilize cognitive compensations to aid recall of pertinent information (as needed) given occasional mod A over 2 sessions Baseline:  Goal status: NOT MET  5.  Pt will implement saliva management strategies to reduce drooling given occasional mod A over 2 sessions  Baseline:  Goal status: MET   LONG TERM GOALS: Target date: 02/20/2022  Pt will complete Speak Out! HEP at least 1x/day (BID recommended) over 1 week  Baseline:  Goal status: IN PROGRESS  2.  Pt will achieve targeted dB levels and clear vocal quality in demonstration of Speak Out! Lessons with 90% accuracy given occasional min A over 2 sessions Baseline:  Goal status: IN PROGRESS  3.  Pt will utilize dysarthria compensations to optimize vocal intensity and clarity in 20+ minute conversation given occasional min A over 2 sessions Baseline:  Goal status: IN PROGRESS  4.  Pt will utilize cognitive compensations to aid recall of pertinent information (as needed) given occasional min A over 2 sessions Baseline:  Goal status: IN PROGRESS  5.  Pt will report improved communication effectiveness via PROM by 2 points at last ST session  Baseline: V-RQOL=15 Goal status: IN PROGRESS   ASSESSMENT:  CLINICAL IMPRESSION: Patient is a 76 y.o. male who was seen today for Parkinson's Disease. Pt participated in OP ST intervention targeting dysarthria from August 2022  to January 2023 to address reduced volume and hoarseness. Inconsistent attendance exhibited related to scheduling, but good progress and carryover of trained techniques demonstrated and reported (benefited when wife attended sessions). Today, pt presents with low 70s dB conversational volume with intermittent increasing to persistent hoarseness. 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 Pt continues to practice sustained phonation in car to church and wife cues if volume dropping during sermon. Pt stated "sometimes I'm squeaky" if he talks early in day. Limited awareness or concern for hoarseness exhibited at this time, although, hoarseness would likely impact his speech intelligibility while preaching. Endorsed some mild memory changes but unable to provide specifics. Pt appeared to explain away deficits related to age and wife experiencing similar symptoms. Ongoing drooling exhibited and reported. Pt would benefit from skilled ST intervention to maximize communication effectiveness, optimize vocal quality, and increase functional independence.   OBJECTIVE IMPAIRMENTS  Objective impairments include memory, awareness, and dysarthria. These impairments are limiting patient from household responsibilities and effectively communicating at home and in community. Factors affecting potential to achieve goals and functional outcome are cooperation/participation level and medical prognosis. Patient will benefit from skilled SLP services to address above  impairments and improve overall function.  REHAB POTENTIAL: Fair - awareness and independent carryover  PLAN: SLP FREQUENCY: 2x/week recommended but pt requested 1x/week due to transportation and limited availability  SLP DURATION: 12 weeks  PLANNED INTERVENTIONS: Language facilitation, Environmental controls, Cueing hierachy, Cognitive reorganization, Internal/external aids, Oral motor exercises, Functional tasks, Multimodal communication  approach, SLP instruction and feedback, Compensatory strategies, and Patient/family education    [place], [place] 02/10/2022, 11:55 AM

## 2022-02-10 NOTE — Therapy (Signed)
OUTPATIENT PHYSICAL THERAPY NEURO TREATMENT   Patient Name: Alexander Thomas MRN: [id number] DOB:Sep 13, 1945, 76 y.o., male Today's Date: 02/10/2022   PCP: Janie Morning, DO REFERRING PROVIDER: Marcial Pacas, MD    PT End of Session - 02/10/22 1016     Visit Number 8    Number of Visits 9   Plus eval   Date for PT Re-Evaluation 03/49/17   Recert   Authorization Type Medicare- part A & B    PT Start Time 1015    PT Stop Time 1100    PT Time Calculation (min) 45 min    Equipment Utilized During Treatment Gait belt    Activity Tolerance Patient tolerated treatment well    Behavior During Therapy WFL for tasks assessed/performed                 Past Medical History:  Diagnosis Date   Hypercholesteremia    Migraine    Polymyositis ([place])    Raynaud disease    Tremor of right hand    Past Surgical History:  Procedure Laterality Date   CARPAL TUNNEL RELEASE Left    INGUINAL HERNIA REPAIR Bilateral    WRIST SURGERY Right    Patient Active Problem List   Diagnosis Date Noted   Acne 06/10/2021   Elevated PSA 06/10/2021   Osteoarthritis 06/10/2021   Osteoporosis 06/10/2021   Postherpetic neuralgia 06/10/2021   Raynaud's disease 06/10/2021   Rosacea 06/10/2021   Gait abnormality 08/29/2019   Midline low back pain without sciatica 08/29/2019   Mild cognitive impairment 08/29/2019   Excessive daytime sleepiness 01/24/2019   Nocturia more than twice per night 01/24/2019   Hypersomnia with sleep apnea 01/24/2019   At risk for central sleep apnea 01/24/2019   RLS (restless legs syndrome) 01/24/2019   PAF (paroxysmal atrial fibrillation) ([place]) 06/04/2015   Idiopathic Parkinson's disease 05/07/2015   Polymyositis ([place]) 05/07/2015    ONSET DATE: 10/27/2021   REFERRING DIAG: G20 (ICD-10-CM) - Idiopathic Parkinson's disease ([place])   THERAPY DIAG:  Other lack of coordination  Unsteadiness on feet  Abnormal posture  Rationale for Evaluation and Treatment  Rehabilitation  SUBJECTIVE:                                                                                                                                                                                              SUBJECTIVE STATEMENT: Pt states he feels like his hip is getting better. No falls. Reports he is able to go up a few steps without his walker now.   Pt accompanied by: self  PERTINENT HISTORY:  hyperlipidemia, Raynaud's disease, had left  carpal tunnel release surgery, right wrist fracture surgery, polymyositis,  left deltoid muscle biopsy consistent with fiber atrophy, perivasculitis in August 2002  PAIN:  Are you having pain? Yes: NPRS scale: 2/10 Pain location: L hip and knee Pain description: Sharp Aggravating factors: "I do not think so" Relieving factors: "takin it easy"  PRECAUTIONS: Fall  PLOF: Requires assistive device for independence, Needs assistance with ADLs, and Needs assistance with homemaking  PATIENT GOALS "I wanna get to where I do not have to depend on the cane"   OBJECTIVE:   TODAY'S TREATMENT:  Ther Ex  SciFit level 2 for 10 minutes using BUE/BLEs for ROM, reciprocal movement, dynamic cardiovascular warmup and increased amplitude of stepping. Pt requiring max cues for safe turn to sit on SciFit seat, as he attempted to push RW away from himself and turn without using RW.Cued to work towards a 7/10 level of effort level and to maintain knee extension ROM. Pt reporting effort level as a 9/10 afterwards.    NMR Reviewed seated PWR Up (given by certified PWR therapist in previous session) for improved posture and big arm movement, x10 reps w/concurrent visual and verbal cues provided for big movement, open arms and to maintain 6-7/10 RPE  Seated march overs using 2# DB for improved step length/clearance, hip flexor strength and LE coordination. Cued pt to stomp his foot with each rep to promote power, but pt unable to do so. Pt performed x10 per side,  min cues to minimize compensation strategy of moving foot around DB rather than over it.  Modified Thomas stretch on L side only, x8 minutes. Pt performed sit <>supine independently but required min A to scoot to edge of mat in supine position due to poor hip clearance and trunk management. Pt very tight in hip flexor and adductor but tolerated stretch well with no increase in pain.    GAIT: Gait pattern: step through pattern, decreased step length- Right, decreased step length- Left, decreased stride length, decreased hip/knee flexion- Right, decreased hip/knee flexion- Left, decreased ankle dorsiflexion- Right, decreased ankle dorsiflexion- Left, Right foot flat, Left foot flat, shuffling, trunk flexed, narrow BOS, poor foot clearance- Right, and poor foot clearance- Left Distance walked: Various clinic distances  Assistive device utilized: Walker - 2 wheeled Level of assistance: SBA and CGA Comments: Cued for gait with posture (looking ahead in front of him) and incr stride length bilat. When turning, cued to stay inside RW and to pick up legs in a march for improved foot clearance. Also cued to keep RW close to him w/turns, as he tends to push RW away     PATIENT EDUCATION: Education details: Continue HEP at 6-7/10 RPE Person educated: Patient Education method: Consulting civil engineer, Media planner, and Verbal cues Education comprehension: verbalized understanding, verbal cues required, and needs further education   HOME EXERCISE PROGRAM: Seated PWR moves, Sit to stands Access Code: [email or web address] URL: [email or web address] Date: 01/27/2022 Prepared by: Mickie Bail Torion Hulgan  Exercises - Alternating Step Backward with Support  - 1-2 x daily - 5 x weekly - 1-2 sets - 10 reps - Side to Side Weight Shift with Overhead Reach and Counter Support  - 1-2 x daily - 5 x weekly - 1-2 sets - 10 reps - Standing Quarter Turn with Counter Support  - 1 x daily - 7 x weekly - 3 sets - 10 reps - Modified  Thomas Stretch  - 1 x daily - 7 x weekly - 3 sets - 45-60 second hold  GOALS: Goals reviewed  with patient? Yes  SHORT TERM GOALS: Target date: 12/23/2021  Pt will perform initial HEP with min A from wife for improved strength, balance, transfers and gait.  Baseline: need to review HEP from previous bout of therapy; pt reports he does his HEP alone  Goal status: IN PROGRESS  2.  Pt will improve normal TUG to less than or equal to 25 seconds w/LRAD for improved functional mobility and decreased fall risk.  Baseline: 30.09s w/RW, significant shuffling w/turns; 37.5s w/RW and increased shuffling w/turns  Goal status: NOT MET  3.  Pt will improve 5 x STS to less than or equal to 20 seconds without UE support to demonstrate improved functional strength and transfer efficiency.   Baseline: 25.59s w/UE support on reps 1&2 due to retropulsion; 22.07s w/BUE support on first rep, no retropulsion noted  Goal status: IN PROGRESS  4.  Pt will verbalize fall prevention strategies in the home and community for reduced fall risk  Baseline:  Goal status: NOT MET  5.  Floor transfer will be performed to assess safety at home w/performing exercises on floor   Baseline:   Goal Status: MET   UPDATED LONG TERM GOALS DUE TO EXTENDED POC:  Target date: 02/17/2022  Pt will perform final HEP with min A from wife for improved strength, balance, transfers and gait. Baseline:  Goal status: INITIAL  2.  Pt will improve cog TUG to less than or equal to 50 seconds w/LRAD for improved functional mobility and decreased fall risk. Baseline: 68.35s on 10/10 Goal status: INITIAL  3.   Pt will improve gait velocity to at least 2.5 ft/s w/LRAD for improved gait efficiency and safety Baseline: 2.12 ft/s w/RW; 1.72 ft/s w/RW Goal status: INITIAL   ASSESSMENT:  CLINICAL IMPRESSION: Emphasis of skilled PT session on improved step clearance/length, LE coordination, proper turns and hip flexor stretch on L side. Pt  requires cues for proper foot and RW placement with turns but did respond well to cues during session, but poor carryover between sessions. Pt continues to be limited by L hip pain and noted significant hip flexor and adductor weakness. Continue POC.    OBJECTIVE IMPAIRMENTS Abnormal gait, decreased activity tolerance, decreased balance, decreased cognition, decreased coordination, decreased endurance, decreased knowledge of condition, decreased knowledge of use of DME, decreased mobility, difficulty walking, decreased safety awareness, impaired perceived functional ability, and pain.   ACTIVITY LIMITATIONS carrying, lifting, bending, sitting, standing, squatting, stairs, transfers, bed mobility, reach over head, locomotion level, and caring for others  PARTICIPATION LIMITATIONS: meal prep, cleaning, laundry, medication management, personal finances, interpersonal relationship, driving, shopping, community activity, occupation, yard work, and church  PERSONAL FACTORS Age, Behavior pattern, Fitness, Transportation, and 1 comorbidity: polymyositis  are also affecting patient's functional outcome.   REHAB POTENTIAL: Fair due to impaired cognition, lack of family support and poor compliance   CLINICAL DECISION MAKING: Evolving/moderate complexity  EVALUATION COMPLEXITY: Moderate  PLAN: PT FREQUENCY: 1x/week  PT DURATION: 8 weeks  PLANNED INTERVENTIONS: Therapeutic exercises, Therapeutic activity, Neuromuscular re-education, Balance training, Gait training, Patient/Family education, Self Care, Stair training, DME instructions, Manual therapy, and Re-evaluation  PLAN FOR NEXT SESSION:  check goals and DC? continue pt education regarding safety at home, turns practice and gait speed , sit <>stands, scifit for endurance and UE/LE coordination, increased step length, postural control.    Cruzita Lederer Mel Tadros, PT, DPT 02/10/2022, 11:02 AM

## 2022-02-17 ENCOUNTER — Ambulatory Visit: Payer: Medicare Other | Admitting: Occupational Therapy

## 2022-02-17 ENCOUNTER — Ambulatory Visit: Payer: Medicare Other | Admitting: Physical Therapy

## 2022-02-17 ENCOUNTER — Ambulatory Visit: Payer: Medicare Other

## 2022-02-17 ENCOUNTER — Encounter: Payer: Self-pay | Admitting: Physical Therapy

## 2022-02-17 DIAGNOSIS — R2681 Unsteadiness on feet: Secondary | ICD-10-CM

## 2022-02-17 DIAGNOSIS — R293 Abnormal posture: Secondary | ICD-10-CM | POA: Diagnosis not present

## 2022-02-17 DIAGNOSIS — R278 Other lack of coordination: Secondary | ICD-10-CM

## 2022-02-17 DIAGNOSIS — R29818 Other symptoms and signs involving the nervous system: Secondary | ICD-10-CM | POA: Diagnosis not present

## 2022-02-17 DIAGNOSIS — R471 Dysarthria and anarthria: Secondary | ICD-10-CM

## 2022-02-17 DIAGNOSIS — M25612 Stiffness of left shoulder, not elsewhere classified: Secondary | ICD-10-CM

## 2022-02-17 DIAGNOSIS — M25622 Stiffness of left elbow, not elsewhere classified: Secondary | ICD-10-CM

## 2022-02-17 DIAGNOSIS — M25621 Stiffness of right elbow, not elsewhere classified: Secondary | ICD-10-CM

## 2022-02-17 DIAGNOSIS — R41841 Cognitive communication deficit: Secondary | ICD-10-CM | POA: Diagnosis not present

## 2022-02-17 NOTE — Therapy (Addendum)
OUTPATIENT OCCUPATIONAL THERAPY PARKINSON'S TREATMENT  Patient Name: Alexander Thomas MRN: [id number] DOB:1945/11/22, 76 y.o., male Today's Date: 02/17/2022  PCP: Janie Morning, DO REFERRING PROVIDER: Dr. Marcial Pacas  OCCUPATIONAL THERAPY DISCHARGE SUMMARY    Current functional level related to goals / functional outcomes: Pt made good overall progress towards goals. Pt met all short term goals and he met 3/5 long term goals.   Remaining deficits: Bradykinesia, decreased balance, decreased functional mobility, decreased coordination, rigidity, decreased ROM   Education / Equipment: Pt was educated in: HEP, adapted strategies for ADLS and ways to prevent future complications. Pt verbalized understanding of all education.  Patient agrees to discharge. Patient goals were partially met. Patient is being discharged due to being pleased with the current functional level..      OT End of Session - 02/17/22 0937     Visit Number 8    Number of Visits 13    Date for OT Re-Evaluation 02/23/22    Authorization Type Medicare & BCBS (BCBS will cover remaining from [facility] Medicare guidelines    Authorization - Visit Number 8    Authorization - Number of Visits 10    Progress Note Due on Visit 10    OT Start Time [id number]    OT Stop Time 1014    OT Time Calculation (min) 38 min    Activity Tolerance Patient tolerated treatment well    Behavior During Therapy WFL for tasks assessed/performed                 Past Medical History:  Diagnosis Date   Hypercholesteremia    Migraine    Polymyositis ([place])    Raynaud disease    Tremor of right hand    Past Surgical History:  Procedure Laterality Date   CARPAL TUNNEL RELEASE Left    INGUINAL HERNIA REPAIR Bilateral    WRIST SURGERY Right    Patient Active Problem List   Diagnosis Date Noted   Acne 06/10/2021   Elevated PSA 06/10/2021   Osteoarthritis 06/10/2021   Osteoporosis 06/10/2021   Postherpetic neuralgia  06/10/2021   Raynaud's disease 06/10/2021   Rosacea 06/10/2021   Gait abnormality 08/29/2019   Midline low back pain without sciatica 08/29/2019   Mild cognitive impairment 08/29/2019   Excessive daytime sleepiness 01/24/2019   Nocturia more than twice per night 01/24/2019   Hypersomnia with sleep apnea 01/24/2019   At risk for central sleep apnea 01/24/2019   RLS (restless legs syndrome) 01/24/2019   PAF (paroxysmal atrial fibrillation) ([place]) 06/04/2015   Idiopathic Parkinson's disease 05/07/2015   Polymyositis ([place]) 05/07/2015    ONSET DATE: 10/27/21 (MD referral)  REFERRING DIAG: Parkinson's Disease  THERAPY DIAG:  Other lack of coordination  Unsteadiness on feet  Abnormal posture  Other symptoms and signs involving the nervous system  Stiffness of left elbow, not elsewhere classified  Stiffness of right elbow, not elsewhere classified  Stiffness of left shoulder, not elsewhere classified  Rationale for Evaluation and Treatment Rehabilitation  SUBJECTIVE:   SUBJECTIVE STATEMENT: Pt reports his elbow bothers him sometimes following old surgery, pt agrees with d/c  Pt accompanied by: self  PERTINENT HISTORY: Parkinson's Disease   PMH:  hx of polymyositis, hx of orthostatic tachycardia, memory, Raynaud's disease, L CTR, R wrist fx surgery, L hip fracture 2/22 from fall, hx of L elbow fx due to fall 2/22 (surgery x2), sleep apnea, restless leg syndrome, hx of back pain, mild cognitive impairment.  PRECAUTIONS: Fall  WEIGHT BEARING RESTRICTIONS No  PAIN:  Are you having pain? Yes: NPRS scale: 2/10 Pain location: L hip, sometimes elbow Pain description: sharp-knee, dull sometimes sharp-hip Aggravating factors: unknown Relieving factors: sitting  FALLS: Has patient fallen in last 6 months? Yes. Number of falls "slipped a time or 2 in the yard"--able to to get up himself  LIVING ENVIRONMENT: Lives with: lives with their family and lives with their spouse Lives  in: House/apartment Has following equipment at home: Single point cane, Environmental consultant - 2 wheeled, Electronics engineer, and Grab bars  PLOF: Independent, Vocation/Vocational requirements: retired, but continues preach on Sundays for [employer], and Leisure: has enjoyed Medical illustrator, gardening, carpentry  PATIENT GOALS  improve movement  HAND DOMINANCE: Right  OBJECTIVE:   TODAY'S TREATMENT:  Therapist checked progress towards goals and discussed plans for d/c. PWR! Up modified quadraped, PWR! Hands for PWR! Up, rock and twist  PATIENT EDUCATION: Education details: progress towards goals and plans for d/c, return PD evals in 6 mons Person educated: Patient Education method: Explanation,  Education comprehension: verbalized understanding,    HOME EXERCISE PROGRAM: 12/16/21:  PWR! Hands (basic 4) 12/29/21:  Review of prior Coordination HEP and supine closed-chain shoulder flex and chest press.  Organized therapy folder, recommended walker bag/tray 01/20/22:  reviewed strategy for donning/doffing jacket. 01/27/22:  Reviewed ways to decr risk of complications related to PD; reviewed PWR! Up, rock, twist in modified quadruped 02/03/22:  Reviewed supine PWR! Moves (basic 4)   ASSESSMENT:  CLINICAL IMPRESSION: Pt made good overall progress, he agrees with d/c.  PERFORMANCE DEFICITS in functional skills including ADLs, IADLs, coordination, dexterity, tone, ROM, FMC, GMC, mobility, balance, endurance, decreased knowledge of precautions, decreased knowledge of use of DME, and UE functional use, cognitive skills including memory, and psychosocial skills including environmental adaptation and habits.   IMPAIRMENTS are limiting patient from ADLs, IADLs, work, and leisure.   COMORBIDITIES may have co-morbidities  that affects occupational performance. Patient will benefit from skilled OT to address above impairments and improve overall function.  MODIFICATION OR ASSISTANCE TO COMPLETE  EVALUATION: Min-Moderate modification of tasks or assist with assess necessary to complete an evaluation.  OT OCCUPATIONAL PROFILE AND HISTORY: Detailed assessment: Review of records and additional review of physical, cognitive, psychosocial history related to current functional performance.  CLINICAL DECISION MAKING: Moderate - several treatment options, min-mod task modification necessary  REHAB POTENTIAL: Good  EVALUATION COMPLEXITY: Moderate    GOALS: Potential Goals reviewed with patient? Yes  SHORT TERM GOALS: Target date: 12/23/2021, 01/22/22  Pt will be independent with updated PD-specific HEP. Goal status: met  2.  Pt will verbalize understanding of ways to decr risk of complications related to PD. Goal status: MET  01/27/22 Reviewed  3.  Pt will improve RUE functional reaching and coordination for ADLs as shown by improving score on box and blocks test by at least 3 with RUE. Baseline:  30 blocks Goal status: MET  01/27/22:  33 blocks  4.  Pt will improve RUE functional reaching and coordination for ADLs as shown by improving score on box and blocks test by at least 3 with LUE. Baseline:  23 blocks Goal status: MET  01/27/22  30 blocks  5.  Pt will demo at least 120* shoulder flex with at least -40* elbow ext with RUE for overhead reach. Baseline:  120* with -50* elbow ext Goal status: MET  01/27/22:  120* with -20* elbow ext    LONG TERM GOALS: Target date: 11/6//2023     Pt  will verbalize understanding of adaptive strategies/AE to incr safety/ease with ADLs/IADLs. Goal status: met, pt verbalizes understanding  2.  Pt will improve RUE functional reaching and coordination for ADLs as shown by improving time on 9-hole peg test by at least 3sec with RUE. Baseline:  44.31sec Goal status: met  R 39.69,  L 47.88  3.  Pt will improve bilatateral coordination and incr ease with dressing as shown by fastening/unfastening 3 buttons in less than 60sec. Baseline:  72.32sec Goal status: improved but not quite met, 60.88  4.  Pt will improve standing functional reach by at least 2 inches with RUE.  Baseline:  R-4", L-7" Goal status: R 6.5, L 9" Met  5.  Assess PPT#4 (donning/doffing jacket) and establish goal as appropriate.   Goal status: Assessed--defer goal (pt able to demo strategies after review).     PLAN: OT FREQUENCY: 1x/week at pt request  OT DURATION: 12 weeks +eval (may d/c after 8 weeks, depending on progress)  PLANNED INTERVENTIONS: self care/ADL training, therapeutic exercise, therapeutic activity, neuromuscular re-education, passive range of motion, gait training, balance training, functional mobility training, ultrasound, moist heat, cryotherapy, patient/family education, cognitive remediation/compensation, and DME and/or AE instructions  RECOMMENDED OTHER SERVICES: current with PT, ST  CONSULTED AND AGREED WITH PLAN OF CARE: Patient  PLAN FOR NEXT SESSION: d/c OT   Arlisa Leclere, OTR/L 02/17/2022, 9:38 AM

## 2022-02-17 NOTE — Therapy (Signed)
OUTPATIENT PHYSICAL THERAPY NEURO TREATMENT/DISCHARGE SUMMARY   Patient Name: Alexander Thomas MRN: [id number] DOB:12-11-1945, 76 y.o., male Today's Date: 02/17/2022   PCP: Janie Morning, DO REFERRING PROVIDER: Marcial Pacas, MD    PT End of Session - 02/17/22 1020     Visit Number 9    Number of Visits 9   Plus eval   Date for PT Re-Evaluation 63/87/56   Recert   Authorization Type Medicare- part A & B    PT Start Time 1019    PT Stop Time 1059    PT Time Calculation (min) 40 min    Equipment Utilized During Treatment Gait belt    Activity Tolerance Patient tolerated treatment well    Behavior During Therapy WFL for tasks assessed/performed                 Past Medical History:  Diagnosis Date   Hypercholesteremia    Migraine    Polymyositis ([place])    Raynaud disease    Tremor of right hand    Past Surgical History:  Procedure Laterality Date   CARPAL TUNNEL RELEASE Left    INGUINAL HERNIA REPAIR Bilateral    WRIST SURGERY Right    Patient Active Problem List   Diagnosis Date Noted   Acne 06/10/2021   Elevated PSA 06/10/2021   Osteoarthritis 06/10/2021   Osteoporosis 06/10/2021   Postherpetic neuralgia 06/10/2021   Raynaud's disease 06/10/2021   Rosacea 06/10/2021   Gait abnormality 08/29/2019   Midline low back pain without sciatica 08/29/2019   Mild cognitive impairment 08/29/2019   Excessive daytime sleepiness 01/24/2019   Nocturia more than twice per night 01/24/2019   Hypersomnia with sleep apnea 01/24/2019   At risk for central sleep apnea 01/24/2019   RLS (restless legs syndrome) 01/24/2019   PAF (paroxysmal atrial fibrillation) ([place]) 06/04/2015   Idiopathic Parkinson's disease 05/07/2015   Polymyositis ([place]) 05/07/2015    ONSET DATE: 10/27/2021   REFERRING DIAG: G20 (ICD-10-CM) - Idiopathic Parkinson's disease ([place])   THERAPY DIAG:  Other lack of coordination  Unsteadiness on feet  Abnormal posture  Other symptoms and signs  involving the nervous system  Rationale for Evaluation and Treatment Rehabilitation  SUBJECTIVE:                                                                                                                                                                                              SUBJECTIVE STATEMENT: Pt reports that he has gotten better with therapy. No falls.   Pt accompanied by: self  PERTINENT HISTORY:  hyperlipidemia, Raynaud's disease, had left carpal tunnel release surgery, right  wrist fracture surgery, polymyositis,  left deltoid muscle biopsy consistent with fiber atrophy, perivasculitis in August 2002  PAIN:  Are you having pain? Yes: NPRS scale: 2/10 Pain location: L hip and knee, L elbow Pain description: Sharp Aggravating factors: "I do not think so" Relieving factors: "takin it easy"  PRECAUTIONS: Fall  PLOF: Requires assistive device for independence, Needs assistance with ADLs, and Needs assistance with homemaking  PATIENT GOALS "I wanna get to where I do not have to depend on the cane"   OBJECTIVE:   TODAY'S TREATMENT:  Therapeutic Activity Reviewed turning technique and purpose of incr foot clearance/big movements when turning. Due to incr time on cog TUG, educated on importance of making sure that when pt is walking, that pt is only focusing on the task of walking, due to impaired ability to dual task and pt with freezing/decr stride length with cog task.  Scheduling return PD evals in 6 months  Reviewed finalized HEP and importance of working up to an 7-8/10 level of effort with each exercise for larger amplitude movement patterns. Pt brought in his exercise folder and PT helped to organize pt's PT section. Educated on importance of exercise with PD and continuing to move.   Seated PWR moves, Sit to stands  Access Code: [email or web address] URL: [email or web address] Date: 01/27/2022 Prepared by: Mickie Bail Plaster  Exercises - Alternating Step  Backward with Support  - 1-2 x daily - 5 x weekly - 1-2 sets - 10 reps - Side to Side Weight Shift with Overhead Reach and Counter Support  - 1-2 x daily - 5 x weekly - 1-2 sets - 10 reps - Standing Quarter Turn with Counter Support  - 1 x daily - 7 x weekly - 3 sets - 10 reps - Modified Thomas Stretch  - 1 x daily - 7 x weekly - 3 sets - 45-60 second hold -Seated march with cues for incr stomping feet    Goal Assessment: Gait speed with RW: 17.28 seconds = 1.89 ft/sec  Cog TUG with RW: 1 minute 42 seconds counting backwards by 3s (freezing when turning), 1 minute 36 seconds counting backwards by 1s (freezing when turning)      GAIT: Gait pattern: step through pattern, decreased step length- Right, decreased step length- Left, decreased stride length, decreased hip/knee flexion- Right, decreased hip/knee flexion- Left, decreased ankle dorsiflexion- Right, decreased ankle dorsiflexion- Left, Right foot flat, Left foot flat, shuffling, trunk flexed, narrow BOS, poor foot clearance- Right, and poor foot clearance- Left Distance walked: Various clinic distances  Assistive device utilized: Walker - 2 wheeled Level of assistance: SBA and CGA Comments: Cued for gait with posture (looking ahead in front of him) and incr stride length bilat. When turning during session, cued to stay inside RW and to pick up legs in a BIG march for improved foot clearance.    PHYSICAL THERAPY DISCHARGE SUMMARY  Visits from Start of Care: 9  Current functional level related to goals / functional outcomes: See LTGs/Clinical Assessment Statement    Remaining deficits: Bradykinesia, gait abnormalities, impaired timing/coordination of gait, postural abnormalities, impaired balance, decr functional strength, impaired cognition.    Education / Equipment: HEP, fall prevention, community resources, techniques for gait/turning and freezing.    Patient agrees to discharge. Patient goals were not met. Patient is being  discharged due to maximized rehab potential.  Pt to be scheduled for PD evals in 6 months.    PATIENT EDUCATION: Education details: Continue HEP at 6-7/10 RPE Person educated: Patient  Education method: Explanation, Demonstration, and Verbal cues Education comprehension: verbalized understanding, verbal cues required, and needs further education   HOME EXERCISE PROGRAM: Seated PWR moves, Sit to stands Access Code: [email or web address] URL: [email or web address] Date: 01/27/2022 Prepared by: Mickie Bail Plaster  Exercises - Alternating Step Backward with Support  - 1-2 x daily - 5 x weekly - 1-2 sets - 10 reps - Side to Side Weight Shift with Overhead Reach and Counter Support  - 1-2 x daily - 5 x weekly - 1-2 sets - 10 reps - Standing Quarter Turn with Counter Support  - 1 x daily - 7 x weekly - 3 sets - 10 reps - Modified Thomas Stretch  - 1 x daily - 7 x weekly - 3 sets - 45-60 second hold  GOALS: Goals reviewed with patient? Yes  SHORT TERM GOALS: Target date: 12/23/2021  Pt will perform initial HEP with min A from wife for improved strength, balance, transfers and gait.  Baseline: need to review HEP from previous bout of therapy; pt reports he does his HEP alone  Goal status: IN PROGRESS  2.  Pt will improve normal TUG to less than or equal to 25 seconds w/LRAD for improved functional mobility and decreased fall risk.  Baseline: 30.09s w/RW, significant shuffling w/turns; 37.5s w/RW and increased shuffling w/turns  Goal status: NOT MET  3.  Pt will improve 5 x STS to less than or equal to 20 seconds without UE support to demonstrate improved functional strength and transfer efficiency.   Baseline: 25.59s w/UE support on reps 1&2 due to retropulsion; 22.07s w/BUE support on first rep, no retropulsion noted  Goal status: IN PROGRESS  4.  Pt will verbalize fall prevention strategies in the home and community for reduced fall risk  Baseline:  Goal status: NOT MET  5.  Floor  transfer will be performed to assess safety at home w/performing exercises on floor   Baseline:   Goal Status: MET   UPDATED LONG TERM GOALS DUE TO EXTENDED POC:  Target date: 02/17/2022  Pt will perform final HEP with min A from wife for improved strength, balance, transfers and gait. Baseline: reviewed pt's HEP,and pt reports performing some exercises at home. Pt's wife did not come to sessions to review  Goal status: PARTIALLY MET  2.  Pt will improve cog TUG to less than or equal to 50 seconds w/LRAD for improved functional mobility and decreased fall risk. Baseline: 68.35s on 10/10; 1 minute 36 seconds on 02/17/22  Goal status: NOT MET  3.   Pt will improve gait velocity to at least 2.5 ft/s w/LRAD for improved gait efficiency and safety Baseline: 2.12 ft/s w/RW; 1.72 ft/s w/RW; 17.28 seconds = 1.89 ft/sec on 02/17/22 with RW Goal status: NOT MET   ASSESSMENT:  CLINICAL IMPRESSION: Today's skilled session focused on assessing LTGs for D/C.  Pt partially met LTG #1 - pt has been performing his PD specific HEP intermittently, but has been working on his walking program with his RW at home. Pt did not meet LTG #2 and #3. Pt performing cog TUG with RW in 1 minute and 36 seconds with pt with freezing when turning. Educated on making sure that pt pays attention only to the task of walking when able due to significant difficulty with dual tasking. Pt's gait speed with RW improved to 1.89 ft/sec, but not quite to goal level (previously 1.72 ft/sec). Pt with decr carryover from session to session due to family/pt's spouse not attending PT. Pt  needs reminders each session for proper sit <> stand technique and turning. Reviewed pt's HEP today with pt needing cues for proper technique and larger amplitude movements. Pt is pleased with his progress in therapy and reports feeling like he is moving better and is in agreement to D/C at this time. Will have pt scheduled for return PD evals for all 3  disciplines in 6 months.    OBJECTIVE IMPAIRMENTS Abnormal gait, decreased activity tolerance, decreased balance, decreased cognition, decreased coordination, decreased endurance, decreased knowledge of condition, decreased knowledge of use of DME, decreased mobility, difficulty walking, decreased safety awareness, impaired perceived functional ability, and pain.   ACTIVITY LIMITATIONS carrying, lifting, bending, sitting, standing, squatting, stairs, transfers, bed mobility, reach over head, locomotion level, and caring for others  PARTICIPATION LIMITATIONS: meal prep, cleaning, laundry, medication management, personal finances, interpersonal relationship, driving, shopping, community activity, occupation, yard work, and church  PERSONAL FACTORS Age, Behavior pattern, Fitness, Transportation, and 1 comorbidity: polymyositis  are also affecting patient's functional outcome.   REHAB POTENTIAL: Fair due to impaired cognition, lack of family support and poor compliance   CLINICAL DECISION MAKING: Evolving/moderate complexity  EVALUATION COMPLEXITY: Moderate  PLAN: PT FREQUENCY: 1x/week  PT DURATION: 8 weeks  PLANNED INTERVENTIONS: Therapeutic exercises, Therapeutic activity, Neuromuscular re-education, Balance training, Gait training, Patient/Family education, Self Care, Stair training, DME instructions, Manual therapy, and Re-evaluation  PLAN FOR NEXT SESSION:  D/C, return PD evals in 6 months    Arliss Journey, PT, DPT 02/17/2022, 11:30 AM

## 2022-02-17 NOTE — Therapy (Signed)
OUTPATIENT SPEECH LANGUAGE PATHOLOGY TREATMENT   Patient Name: Alexander Thomas MRN: [id number] DOB:11-27-1945, 76 y.o., male Today's Date: 02/17/2022  PCP: Janie Morning, DO REFERRING PROVIDER: Marcial Pacas, MD    End of Session - 02/17/22 1157     Visit Number 7    Number of Visits 13    Date for SLP Re-Evaluation 02/20/22    Authorization Type Medicare    SLP Start Time 1105    SLP Stop Time  1150    SLP Time Calculation (min) 45 min    Activity Tolerance Patient tolerated treatment well                   Past Medical History:  Diagnosis Date   Hypercholesteremia    Migraine    Polymyositis ([place])    Raynaud disease    Tremor of right hand    Past Surgical History:  Procedure Laterality Date   CARPAL TUNNEL RELEASE Left    INGUINAL HERNIA REPAIR Bilateral    WRIST SURGERY Right    Patient Active Problem List   Diagnosis Date Noted   Acne 06/10/2021   Elevated PSA 06/10/2021   Osteoarthritis 06/10/2021   Osteoporosis 06/10/2021   Postherpetic neuralgia 06/10/2021   Raynaud's disease 06/10/2021   Rosacea 06/10/2021   Gait abnormality 08/29/2019   Midline low back pain without sciatica 08/29/2019   Mild cognitive impairment 08/29/2019   Excessive daytime sleepiness 01/24/2019   Nocturia more than twice per night 01/24/2019   Hypersomnia with sleep apnea 01/24/2019   At risk for central sleep apnea 01/24/2019   RLS (restless legs syndrome) 01/24/2019   PAF (paroxysmal atrial fibrillation) ([place]) 06/04/2015   Idiopathic Parkinson's disease 05/07/2015   Polymyositis ([place]) 05/07/2015    ONSET DATE: PD dx 2016; 10/27/2021 (referral date)  REFERRING DIAG: G20 (ICD-10-CM) - Idiopathic Parkinson's disease   THERAPY DIAG:  Dysarthria and anarthria  Rationale for Evaluation and Treatment Rehabilitation  SUBJECTIVE:   SUBJECTIVE STATEMENT: "Hip and elbow are better. I am talking louder so my wife can hear me." PAIN:  Are you having pain?No, reports  did have hip pain but has resolved   OBJECTIVE:    TODAY'S TREATMENT:  02-17-22:  Pt scored 13 (good) on V-RQOL, 2 point improvement from evaluation. Pt reports increased independence in awareness of reduced vocal intensity and can adjust his when needed volume. Pt could not recall which chapter was on in the Speak Out book and reports completing 2-3x a week. SLP provided education to complete once a day, education on reminder systems, and to record the date on each chapter when completed to aid I'm recall. SLP provided handouts on memory and vocal strength strategies/exercises. SLP facilitated lesson 8 in Speak out program Pt required min A verbal cues for awareness of hoarse vocal quality with ability to intermittently correct. Averages as follows: Loud, sustained "ah": 84 dB Counting: 82 dB Reading (phrases): 77 dB  Cognitive exercise: 74 dB Education was completed.   02-10-22: Pt reports completing HEP Speak Out 2-3x a week. SLP provided education to complete once a day if possible and make it apart of his daily routine. SLP leads pt through [place] 5, providing usual modeling prior to pt execution and usual faded to rare min-A for accuracy in completion. Pt demonstrated awareness of moderate hoarseness with intermittent ability to correct. Averages as follows: Loud, sustained "ah": 83 dB Counting: 80 dB Reading (phrases): 78 dB  Cognitive exercise: 74 dB Pt required mod A  fade to min A verbal and visualization strategies to comprehend cognitive exercises. SLP educated pt on chunking strategies to aid in auditory comprehension and recall during cognitive exercise. SLP provided instruction to continue HEP in Speak out exercises each day.   02-03-22: Pt reports to feeling like voice and speech are within functional limits for his needs. Is not completing HEP. Reports to doing vocal warm up prior to Sunday services with wife providing feedback. SLP educates on reduced volume but also  requirement for pt to have personal buy in for speech therapy to be efficacious. Discussion on optimizing outcomes by pt determining goal Pt reports concern re: saliva management with increased drooling observed. SLP provides education on saliva management strategies. Cued intentional swallow every 60 seconds initiated with use of visual timer to aid in completion of intentional swallows, over 15 minute period, with no anterior loss of saliva observed throughout structured practice. Education on additional strategy of chewing gum may be beneficial. Targeted use of intent to alleviate hoarseness during loud, sustained "ah" with max-A, achieved improved vocal quality in 50% of trials. Introduced straw phonation exercises to aid in reduction of hoarseness which may be related to vocal fold atrophy related to normal aging. Max-A with usual demonstration to complete sustained vowel phonation with biofeedback of bubbles in 70% of trials. Following intermittent clearing of hoarseness with use of intent and change in pitch. Unable to determine if hoarseness is resultant of hypokinetic dysarthria of other etiology d/t no imaging having been completed and inconsistent clearing with use of cued strategies.   01-20-22: Pt enters with sub-optimal volume, averaging 65 dB, and usual hoarseness. With verbal cues, pt able to increase intensity, resulting in average of 72 dB in automatic speech tasks. With cued use of intent, hoarseness clears. Pt reports he has not done HEP since last visit. SLP leads pt through [place] 2, providing usual modeling prior to pt execution and usual faded to rare min-A for accuracy in completion. Emerging awareness of hoarseness with intermittent ability to correct. Averages as follows: Loud, sustained "ah": 90 dB Counting: 82 dB Reading (phrases): 79 dB Cognitive exercise: 75 dB Usual verbal cues, including recasting of utterances required for carryover of intentional speech through  entirety of session. Re-education of HEP, provided visual aid to aid in completion of exercises per established schedule. Following, pt able to find visual aid and recall lesson to complete tomorrow with supervision-A.   12-29-21: Pt reports wife less frequently requesting for repetition, pt tells SLP "I crank it up a bit." Pt has not been completing HEP, agreeable to begin implementing this date. SLP provide written instructions to aid in completion. SLP leads pt through [place] 2, providing usual modeling prior to pt execution and usual faded to rare min-A for accuracy in completion. Averages as follows: Loud, sustained "ah": 81 dB Counting: 75 dB Reading (phrases): 76 dB Cognitive exercise: 67 dB Improved volume noted in conversation, averages 70 dB, likely 2/2 cognitive component. Hoarseness present, clears through intentional speech. Usual cues required, to include recasting, for carryover of intentional speech to side comments. As session progresses,  pt self-correcting intermittently. ID using "preacher" voice as strategy to aid in carryover of intentional speech.   12-16-21: Pt enters with sub-optimal volume and usual hoarseness. ID key word which will assist pt in using intent  Target improving vocal quality and increasing intensity through progressively difficulty speech tasks using Speak Out! program, lesson 1. ST leads pt through exercises providing usual model  prior to pt execution. Usual mod-A required to achieve target dB this date. Averages this date: loud "ah" 91 dB; reading (phrases) 78 dB; cognitive speech task 74 dB. Pt's hoarseness noted to clear with use of intentional speech. Conversational sample of approx 10 minutes, pt averages 67 dB with usual max-A, to include visual and verbal cueing and recasting. Difficulty using intent when attempting to organize thoughts or recall details in story retell.   11-25-21: Discussed any possible changes in communication, cognition, and  swallow function. Pt reports some minimal changes in voice and memory since last course of ST intervention, although hoarseness notably worse (conversational volume was sufficient today). SLP educated patient on recommendation to address vocal quality and possibly memory changes, in which pt agreeable. SLP assisted with ordering Speak Out! Workbook today.    PATIENT EDUCATION: Education details: see above Person educated: Patient Education method: Customer service manager Education comprehension: verbalized understanding, returned demonstration, and needs further education   HOME EXERCISE PROGRAM: Speak Out! Program   GOALS: Goals reviewed with patient? Yes  SHORT TERM GOALS: Target date: 01/20/2022  (extended d/t scheduling issues resulting in pt only having 1 visit scheduled)  Pt will complete Speak Out! HEP at least 1x/day given occasional min A over 2 sessions  Baseline: Goal status: NOT MET  2.  Pt will achieve targeted dB level and clear vocal quality in demonstration of Speak Out! Lessons with 80% accuracy given occasional mod A over 2 sessions Baseline:  Goal status: MET  3.  Pt will utilize dysarthria compensations to optimize vocal intensity and clarity in 5-10 minute conversation given occasional mod A over 2 sessions Baseline:  Goal status: NOT MET  4. Pt will utilize cognitive compensations to aid recall of pertinent information (as needed) given occasional mod A over 2 sessions Baseline:  Goal status: NOT MET  5.  Pt will implement saliva management strategies to reduce drooling given occasional mod A over 2 sessions  Baseline:  Goal status: MET   LONG TERM GOALS: Target date: 02/20/2022  Pt will complete Speak Out! HEP at least 1x/day (BID recommended) over 1 week  Baseline:  Goal status: NOT MET   2.  Pt will achieve targeted dB levels and clear vocal quality in demonstration of Speak Out! Lessons with 90% accuracy given occasional min A over 2  sessions Baseline:  Goal status: NOT MET   3.  Pt will utilize dysarthria compensations to optimize vocal intensity and clarity in 20+ minute conversation given occasional min A over 2 sessions Baseline:  Goal status: NOT MET   4.  Pt will utilize cognitive compensations to aid recall of pertinent information (as needed) given occasional min A over 2 sessions Baseline:  Goal status: NOT MET   5.  Pt will report improved communication effectiveness via PROM by 2 points at last ST session  Baseline: V-RQOL=15 Goal status: MET    ASSESSMENT:  CLINICAL IMPRESSION: Patient is a 76 y.o. male who was seen today for Parkinson's Disease. Pt participated in OP ST intervention targeting dysarthria from August 2022 to January 2023 to address reduced volume and hoarseness. Inconsistent attendance exhibited related to scheduling, but good progress and carryover of trained techniques demonstrated and reported (benefited when wife attended sessions). Today, pt presents with low 70s dB conversational volume with intermittent increasing to persistent hoarseness. 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 Pt continues to practice sustained phonation in car to church and wife cues if volume dropping during sermon.  Pt stated "sometimes I'm squeaky" if he talks early in day. Limited awareness or concern for hoarseness exhibited at this time, although, hoarseness would likely impact his speech intelligibility while preaching. Endorsed some mild memory changes but unable to provide specifics. Pt appeared to explain away deficits related to age and wife experiencing similar symptoms. Ongoing drooling exhibited and reported.  Pt demonstrated overall progress from mod to min A verbal cues for vocal intensity and awareness of hoarse vocal quality with ability to correct intermittently. Pt required mod-min A verbal cues for recall strategies and demonstrated improvement in salvia management with  supervision A verbal cues. Pt was not able to met all goals due to minimal carryover. Pt would continue to benefit from skilled ST intervention to maximize communication effectiveness, optimize vocal quality, and increase functional independence.   OBJECTIVE IMPAIRMENTS  Objective impairments include memory, awareness, and dysarthria. These impairments are limiting patient from household responsibilities and effectively communicating at home and in community. Factors affecting potential to achieve goals and functional outcome are cooperation/participation level and medical prognosis. Patient will benefit from skilled SLP services to address above impairments and improve overall function.  REHAB POTENTIAL: Fair - awareness and independent carryover    PLAN: SLP FREQUENCY: 2x/week recommended but pt requested 1x/week due to transportation and limited availability  SLP DURATION: 12 weeks  PLANNED INTERVENTIONS: Language facilitation, Environmental controls, Cueing hierachy, Cognitive reorganization, Internal/external aids, Oral motor exercises, Functional tasks, Multimodal communication approach, SLP instruction and feedback, Compensatory strategies, and Patient/family education  SPEECH THERAPY DISCHARGE SUMMARY  Visits from Start of Care: 7  Current functional level related to goals / functional outcomes: Pt requires min-mod A verbal cues for recall and speech strategies. Pt was completing HEP 2-3x per week instead of x1 per day as recommended. Pt demonstrated improvement in awareness of reduced vocal intensity and hoarseness in vocal quality.    Remaining deficits: Hoarse vocal quality and reduced volume in conversation.    Education / Equipment: Education has been completed including Nurse, children's, as well as continuing Speak Out program.  Patient agrees to discharge. Patient goals were partially met. Patient is being discharged due to maximized rehab potential.  .    [place], [place] 02/17/2022, 12:31 PM

## 2022-03-02 DIAGNOSIS — L700 Acne vulgaris: Secondary | ICD-10-CM | POA: Diagnosis not present

## 2022-03-02 DIAGNOSIS — C4442 Squamous cell carcinoma of skin of scalp and neck: Secondary | ICD-10-CM | POA: Diagnosis not present

## 2022-04-06 ENCOUNTER — Ambulatory Visit (INDEPENDENT_AMBULATORY_CARE_PROVIDER_SITE_OTHER): Payer: Medicare Other | Admitting: Neurology

## 2022-04-06 ENCOUNTER — Encounter: Payer: Self-pay | Admitting: Neurology

## 2022-04-06 VITALS — BP 118/75 | HR 91 | Ht 69.0 in | Wt 157.5 lb

## 2022-04-06 DIAGNOSIS — M3320 Polymyositis, organ involvement unspecified: Secondary | ICD-10-CM | POA: Diagnosis not present

## 2022-04-06 DIAGNOSIS — G3184 Mild cognitive impairment, so stated: Secondary | ICD-10-CM

## 2022-04-06 DIAGNOSIS — R269 Unspecified abnormalities of gait and mobility: Secondary | ICD-10-CM | POA: Diagnosis not present

## 2022-04-06 DIAGNOSIS — G20A1 Parkinson's disease without dyskinesia, without mention of fluctuations: Secondary | ICD-10-CM | POA: Diagnosis not present

## 2022-04-06 MED ORDER — RASAGILINE MESYLATE 1 MG PO TABS: 1.0000 mg | ORAL_TABLET | Freq: Every day | ORAL | 3 refills | Status: AC

## 2022-04-06 NOTE — Progress Notes (Signed)
ASSESSMENT AND PLAN 76 y.o. year old male  Idiopathic Parkinson's disease  He continues to decline slowly, has increased difficulty, but only remember taking Sinemet 1 tablet daily, today he was found to be under medicated, I have suggested him Sinemet 25/100 mg 3 times a day at 7, noon, 5 PM  Continue Azilect 1 mg daily  Could not tolerate dopamine agonist Requip in the past, complains worsening memory loss,  History of polymyositis  Doing well, there was no muscle weakness noted, decrease Imuran to 50 mg daily  Repeat lab   History postural orthostatic tachycardia:  Overall mild improvement, today I did not see any significant orthostatic blood pressure changes,  Emphasized importance of increased water intake Hypersialorrhea  DIAGNOSTIC DATA (LABS, IMAGING, TESTING) - I reviewed patient records, labs, notes, testing and imaging myself where available. Laboratory evaluation in December 2021, normal CBC,  hemoglobin of 14.6, CMP, creat 0.83, TSH 2.03.    HISTORY OF PRESENT ILLNESS: Alexander Thomas is a 76 years old right-handed male, accompanied by his wife, seen in refer by his primary care physician Dr. Jani Gravel for evaluation of right hand tremor, Initial evaluation was in August 2016   I have reviewed and summarized his most recent office note   He had a past medical history of hyperlipidemia, Raynaud's disease,, had left carpal tunnel release surgery, right wrist fracture surgery, polymyositis,  left deltoid muscle biopsy consistent with fiber atrophy, perivasculitis in August 2002, at its worst, he had difficulty standing up, need his wife help him dressing, he has taken methotrexate along with low-dose prednisone for many years, in 2015, he was switched to Imuran 50 mg twice a day, most recent CPK level was 83, he denies significant muscle weakness now   I also reviewed laboratory results in July 2016, normal CMP, CBC, CPK 83, mild elevated LDL 134, normal ESR 6, hepatitis  panel was negative   He is a retired Company secretary, since 2014, he noticed mild right hand tremor, getting worse gradually, there was no significant gait difficulty, mild lack of facial expression, he has lack of smell, which has been a chronic issues, he denies REM sleep disorder, mild constipations.   Update February 04 2015: He was diagnosed with mild Parkinson's disease, was started on Azilect since August 2016, does not notice any symptomatic improvement either,   MRI of the brain without contrast in August 2016, mild supratentorium small vessel disease, no acute lesions.   UPDATE May 07 2015: He was started on requip xr titrating dose since October 2016, He still has mild right hand shaking, left hand is less, he is now taking Azilect 1 mg every day, requip xr 2 mg 2 tablets every night at 8pm.  He did not noticed any significant side effect.  It does help his tremor   He denies muscle weakness or muscle pain, he had a history of inflammatory polymyositis   UPDATE July 17th 2017: He is taking Azilect 28m qday, he tolerated requip xL 479m2 tabs po qhs well, he still has tremor, he sleeps well, he lost sense of smell, he has no significant gait abnormality, he has ok appetite, he exercise regularly,  5-10 mintues sit ups at that time, he denies obsessive compulsive behavior.   Update August 10 2016: Parkinson's Disease,  He is now taking ropinirole XL 4 mg 4 tablets every night, Azilect 1 mg every morning, there was no significant fluctuation noticed, he ambulate without much difficulty, does has intermittent right  hand resting tremor,   Inflammatory polymyositis Imuran 50 mg 2 tablets every day He is doing well, still has significant right hand tremor,    UPDATE November 16 2017: He feels fine, continue to pastor a smaller church, taking Requip xl 45m4 tabs qhs, azilect 143mdaily, imuran 5015m tabs every day, he denies significant muscle weakness, stated he can walk better, but has mild  increased drooling, he denies significant memory loss,   UPDATE Feb 6th 2020: He is accompanied by his wife at today's clinical visit, missing his medication frequently, slow worsening memory loss, Today's Moca examination is 23 out of 30 no significant muscle weakness, He still passed her small church of elderly attendants  UPDATE Aug 29 2019: He has constant right hand tremor, 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, was noted by his wife has increased gait abnormality, mild memory loss, MoCA examination is 21/30 today  He also complains of significant low back pain, stay at midline, unbearable sometimes, especially after prolonged standing or walking  Sleep study in October 2020 showed no significant obstructive sleep apnea, he has frequent nocturia, wake up 3-4 times each night, usually able to go back to sleep without much difficulty  Update March 26, 2020 Wife concerned about his weight loss, 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  Update July 30, 2020 He fell broke his left elbow require surgery on June 02, 2020, also suffered a left pelvic fracture, now just began to weightbearing, he stayed at nursing home since then, he tried to limit his water intake worried about going to the bathroom  He was noted to have orthostatic dizziness, hypotension, today he did not have significant blood pressure drop getting up from seated position, but there was significant orthostatic tachycardia  Blood pressure sitting down 122/75/80; standing up 115/64, heart rate of 96; standing up for 1 minute 122/72, heart rate of 96; standing up to 3 minutes 128/77, heart rate of 103, is mildly symptomatic,  UPDATE December 02 2020: He is accompanied by his wife at today's visit, overall stable, he spent most  time sitting down, still gives sermon once a week, chronic taking Sinemet 25/100 mg 1 tablet in the morning, because mostly sedentary, he does not complains of orthostatic dizziness getting home, today sitting down blood pressure 120/70, standing up 110/70, he did not complains of orthostatic symptoms, but apparently he has increased gait abnormality, was also undermedicated with Sinemet  UPDATE Apr 06 2022: He is accompanied by his wife at today's clinical visit, spending most of time alone at home, wife will retire soon, he will also finish deliver his last 2 sermon, retire in January 2024 He has slow decline in functional status, worsening gait abnormality, drooling, memory loss, he only takes Sinemet 25/100 mg every morning, most sedentary, was not sure the medicine is helping him,  He sleeps well, excessive sleepiness during the day, has okay appetite, denies muscle weakness, he has urinary urgency, frequency, with frequent constipations   PHYSICAL EXAM  Vitals:   07/30/20 1224  BP: 119/73  Pulse: 79   There is no height or weight on file to calculate BMI.  PHYSICAL EXAMNIATION:  Gen: NAD, conversant, well nourised, well groomed       NEUROLOGICAL EXAM:  MENTAL STATUS: Speech/Cognition: Microphonias, wet soft voice  MMSE -  Mini Mental State Exam 03/26/2020  Orientation to time 5  Orientation to Place 5  Registration 3  Attention/ Calculation 5  Recall 2  Language- name 2 objects 2  Language- repeat 1  Language- follow 3 step command 3  Language- read & follow direction 1  Write a sentence 1  Copy design 1  Total score 29   CRANIAL NERVES: CN II: Visual fields are full to confrontation.  Pupils are round equal and briskly reactive to light. CN III, IV, VI: extraocular movement are normal. No ptosis. CN V: Facial sensation is intact to light touch. CN VII: Face is symmetric with normal eye closure and smile. CN VIII: Hearing is normal to casual conversation CN IX, X:  Palate elevates symmetrically. Phonation is normal. CN XI: Head turning and shoulder shrug are intact    MOTOR: Bilateral hand resting tremor, left more than right rigidity, bradykinesia,  REFLEXES: Reflexes are 1  and symmetric at the biceps, triceps, knees and ankles. Plantar responses are flexor.  SENSORY: Intact to light touch, pinprick, positional and vibratory sensation at fingers and toes.  COORDINATION: There is no trunk or limb ataxia.    GAIT/STANCE: He needs push-up to get up from seated position, small shuffling gait,camptocormia.  REVIEW OF SYSTEMS: Out of a complete 14 system review of symptoms, the patient complains only of the following symptoms, and all other reviewed systems are negative.  Tremor, apnea  ALLERGIES: No Known Allergies  HOME MEDICATIONS: Outpatient Medications Prior to Visit  Medication Sig Dispense Refill   azaTHIOprine (IMURAN) 50 MG tablet Take 1 tablet (50 mg total) by mouth in the morning and at bedtime. 180 tablet 4   carbidopa-levodopa (SINEMET IR) 25-100 MG tablet Take one tablet three times daily. 270 tablet 4   COD LIVER OIL PO Take by mouth.     doxycycline (VIBRAMYCIN) 100 MG capsule as needed.   3   gabapentin (NEURONTIN) 300 MG capsule as needed.      magnesium hydroxide (MILK OF MAGNESIA) 400 MG/5ML suspension Take 5 mLs by mouth as needed for mild constipation.     rasagiline (AZILECT) 1 MG TABS tablet Take 1 tablet (1 mg total) by mouth daily. 90 tablet 3   terbinafine (LAMISIL) 250 MG tablet Take 250 mg by mouth daily.     No facility-administered medications prior to visit.    PAST MEDICAL HISTORY: Past Medical History:  Diagnosis Date   Hypercholesteremia    Migraine    Polymyositis (HCC)    Raynaud disease    Tremor of right hand     PAST SURGICAL HISTORY: Past Surgical History:  Procedure Laterality Date   CARPAL TUNNEL RELEASE Left    INGUINAL HERNIA REPAIR Bilateral    WRIST SURGERY Right     FAMILY  HISTORY: Family History  Problem Relation Age of Onset   Uterine cancer Mother    Healthy Father    Kidney cancer Brother    Colon cancer Brother     SOCIAL HISTORY: Social History   Socioeconomic History   Marital status: Married    Spouse name: Dollie   Number of children: 2   Years of education: PhD   Highest education level: Not on file  Occupational History   Occupation: Company secretary  Tobacco Use   Smoking status: Never   Smokeless tobacco: Never  Substance and Sexual Activity   Alcohol use: No    Alcohol/week: 0.0 standard drinks of alcohol   Drug use: No   Sexual  activity: Not on file  Other Topics Concern   Not on file  Social History Narrative   Lives at home with his wife.   Right-handed.   1 cup caffeine daily.   Social Determinants of Health   Financial Resource Strain: Not on file  Food Insecurity: Not on file  Transportation Needs: Not on file  Physical Activity: Not on file  Stress: Not on file  Social Connections: Not on file  Intimate Partner Violence: Not on file       Marcial Pacas, M.D. Ph.D.  [facility] [place], [place] [address] Phone: [phone] Fax:      [phone]

## 2022-04-07 LAB — CBC WITH DIFFERENTIAL/PLATELET
Basophils Absolute: 0 10*3/uL (ref 0.0–0.2)
Basos: 0 %
EOS (ABSOLUTE): 0.1 10*3/uL (ref 0.0–0.4)
Eos: 1 %
Hematocrit: 41.3 % (ref 37.5–51.0)
Hemoglobin: 13.9 g/dL (ref 13.0–17.7)
Immature Grans (Abs): 0 10*3/uL (ref 0.0–0.1)
Immature Granulocytes: 0 %
Lymphocytes Absolute: 1.5 10*3/uL (ref 0.7–3.1)
Lymphs: 21 %
MCH: 30.7 pg (ref 26.6–33.0)
MCHC: 33.7 g/dL (ref 31.5–35.7)
MCV: 91 fL (ref 79–97)
Monocytes Absolute: 0.7 10*3/uL (ref 0.1–0.9)
Monocytes: 10 %
Neutrophils Absolute: 4.9 10*3/uL (ref 1.4–7.0)
Neutrophils: 68 %
Platelets: 215 10*3/uL (ref 150–450)
RBC: 4.53 x10E6/uL (ref 4.14–5.80)
RDW: 13.2 % (ref 11.6–15.4)
WBC: 7.2 10*3/uL (ref 3.4–10.8)

## 2022-04-07 LAB — COMPREHENSIVE METABOLIC PANEL
ALT: 10 IU/L (ref 0–44)
AST: 15 IU/L (ref 0–40)
Albumin/Globulin Ratio: 1.4 (ref 1.2–2.2)
Albumin: 4 g/dL (ref 3.8–4.8)
Alkaline Phosphatase: 77 IU/L (ref 44–121)
BUN/Creatinine Ratio: 25 — ABNORMAL HIGH (ref 10–24)
BUN: 21 mg/dL (ref 8–27)
Bilirubin Total: 0.2 mg/dL (ref 0.0–1.2)
CO2: 27 mmol/L (ref 20–29)
Calcium: 9.2 mg/dL (ref 8.6–10.2)
Chloride: 103 mmol/L (ref 96–106)
Creatinine, Ser: 0.85 mg/dL (ref 0.76–1.27)
Globulin, Total: 2.8 g/dL (ref 1.5–4.5)
Glucose: 86 mg/dL (ref 70–99)
Potassium: 4.8 mmol/L (ref 3.5–5.2)
Sodium: 142 mmol/L (ref 134–144)
Total Protein: 6.8 g/dL (ref 6.0–8.5)
eGFR: 90 mL/min/{1.73_m2} (ref >59)

## 2022-04-07 LAB — TSH: TSH: 1.53 u[IU]/mL (ref 0.450–4.500)

## 2022-04-07 LAB — CK: Total CK: 92 U/L (ref 41–331)

## 2022-04-09 ENCOUNTER — Telehealth: Payer: Self-pay

## 2022-04-09 NOTE — Telephone Encounter (Signed)
Pt returned phone, inform pt of lab results. Pt verbalized understand.

## 2022-04-09 NOTE — Telephone Encounter (Signed)
-----   Message from Marcial Pacas, MD sent at 04/07/2022  8:48 AM EST ----- Please call patient, laboratory evaluation showed no significant abnormality, please continue with the plan as discussed during office visit.

## 2022-04-09 NOTE — Telephone Encounter (Signed)
PHONE STAFF CAN RELAY  Contacted pt, LVM rq CB   Please advise pt labs showed no significant abnormality, everything was normal. Please continue with the plan as discussed during office visit.

## 2022-06-01 DIAGNOSIS — Z79899 Other long term (current) drug therapy: Secondary | ICD-10-CM | POA: Diagnosis not present

## 2022-06-01 DIAGNOSIS — M1990 Unspecified osteoarthritis, unspecified site: Secondary | ICD-10-CM | POA: Diagnosis not present

## 2022-06-01 DIAGNOSIS — J849 Interstitial pulmonary disease, unspecified: Secondary | ICD-10-CM | POA: Diagnosis not present

## 2022-06-01 DIAGNOSIS — M3322 Polymyositis with myopathy: Secondary | ICD-10-CM | POA: Diagnosis not present

## 2022-06-01 DIAGNOSIS — M7989 Other specified soft tissue disorders: Secondary | ICD-10-CM | POA: Diagnosis not present

## 2022-06-01 DIAGNOSIS — M810 Age-related osteoporosis without current pathological fracture: Secondary | ICD-10-CM | POA: Diagnosis not present

## 2022-06-01 DIAGNOSIS — I7300 Raynaud's syndrome without gangrene: Secondary | ICD-10-CM | POA: Diagnosis not present

## 2022-06-01 DIAGNOSIS — I878 Other specified disorders of veins: Secondary | ICD-10-CM | POA: Diagnosis not present

## 2022-07-03 DIAGNOSIS — Z0000 Encounter for general adult medical examination without abnormal findings: Secondary | ICD-10-CM | POA: Diagnosis not present

## 2022-07-03 DIAGNOSIS — M3320 Polymyositis, organ involvement unspecified: Secondary | ICD-10-CM | POA: Diagnosis not present

## 2022-07-03 DIAGNOSIS — Z6824 Body mass index (BMI) 24.0-24.9, adult: Secondary | ICD-10-CM | POA: Diagnosis not present

## 2022-07-03 DIAGNOSIS — G20A1 Parkinson's disease without dyskinesia, without mention of fluctuations: Secondary | ICD-10-CM | POA: Diagnosis not present

## 2022-07-03 DIAGNOSIS — D84821 Immunodeficiency due to drugs: Secondary | ICD-10-CM | POA: Diagnosis not present

## 2022-07-03 DIAGNOSIS — M810 Age-related osteoporosis without current pathological fracture: Secondary | ICD-10-CM | POA: Diagnosis not present

## 2022-07-03 DIAGNOSIS — R972 Elevated prostate specific antigen [PSA]: Secondary | ICD-10-CM | POA: Diagnosis not present

## 2022-07-03 DIAGNOSIS — M25559 Pain in unspecified hip: Secondary | ICD-10-CM | POA: Diagnosis not present

## 2022-07-03 DIAGNOSIS — Z79899 Other long term (current) drug therapy: Secondary | ICD-10-CM | POA: Diagnosis not present

## 2022-07-14 DIAGNOSIS — H04123 Dry eye syndrome of bilateral lacrimal glands: Secondary | ICD-10-CM | POA: Diagnosis not present

## 2022-07-14 DIAGNOSIS — H25813 Combined forms of age-related cataract, bilateral: Secondary | ICD-10-CM | POA: Diagnosis not present

## 2022-07-23 DIAGNOSIS — R7303 Prediabetes: Secondary | ICD-10-CM | POA: Diagnosis not present

## 2022-07-23 DIAGNOSIS — M3320 Polymyositis, organ involvement unspecified: Secondary | ICD-10-CM | POA: Diagnosis not present

## 2022-07-23 DIAGNOSIS — Z0000 Encounter for general adult medical examination without abnormal findings: Secondary | ICD-10-CM | POA: Diagnosis not present

## 2022-07-23 DIAGNOSIS — I4891 Unspecified atrial fibrillation: Secondary | ICD-10-CM | POA: Diagnosis not present

## 2022-07-23 DIAGNOSIS — D6869 Other thrombophilia: Secondary | ICD-10-CM | POA: Diagnosis not present

## 2022-07-23 DIAGNOSIS — N400 Enlarged prostate without lower urinary tract symptoms: Secondary | ICD-10-CM | POA: Diagnosis not present

## 2022-07-23 DIAGNOSIS — I878 Other specified disorders of veins: Secondary | ICD-10-CM | POA: Diagnosis not present

## 2022-07-23 DIAGNOSIS — I739 Peripheral vascular disease, unspecified: Secondary | ICD-10-CM | POA: Diagnosis not present

## 2022-07-23 DIAGNOSIS — G20A1 Parkinson's disease without dyskinesia, without mention of fluctuations: Secondary | ICD-10-CM | POA: Diagnosis not present

## 2022-07-23 DIAGNOSIS — Z0001 Encounter for general adult medical examination with abnormal findings: Secondary | ICD-10-CM | POA: Diagnosis not present

## 2022-08-03 DIAGNOSIS — M810 Age-related osteoporosis without current pathological fracture: Secondary | ICD-10-CM | POA: Diagnosis not present

## 2022-08-18 DIAGNOSIS — G20C Parkinsonism, unspecified: Secondary | ICD-10-CM | POA: Diagnosis not present

## 2022-09-15 ENCOUNTER — Ambulatory Visit: Payer: Medicare Other | Admitting: Physical Therapy

## 2022-09-15 ENCOUNTER — Ambulatory Visit: Payer: Medicare Other | Admitting: Occupational Therapy

## 2022-09-15 ENCOUNTER — Ambulatory Visit: Payer: Medicare Other | Admitting: Speech Pathology

## 2022-09-22 DIAGNOSIS — G20A1 Parkinson's disease without dyskinesia, without mention of fluctuations: Secondary | ICD-10-CM | POA: Diagnosis not present

## 2022-09-22 DIAGNOSIS — M3320 Polymyositis, organ involvement unspecified: Secondary | ICD-10-CM | POA: Diagnosis not present

## 2022-09-22 DIAGNOSIS — D84821 Immunodeficiency due to drugs: Secondary | ICD-10-CM | POA: Diagnosis not present

## 2022-09-22 DIAGNOSIS — Z79899 Other long term (current) drug therapy: Secondary | ICD-10-CM | POA: Diagnosis not present

## 2022-09-22 DIAGNOSIS — Z1330 Encounter for screening examination for mental health and behavioral disorders, unspecified: Secondary | ICD-10-CM | POA: Diagnosis not present

## 2022-09-23 DIAGNOSIS — D84821 Immunodeficiency due to drugs: Secondary | ICD-10-CM | POA: Diagnosis not present

## 2022-09-23 DIAGNOSIS — M3320 Polymyositis, organ involvement unspecified: Secondary | ICD-10-CM | POA: Diagnosis not present

## 2022-09-23 DIAGNOSIS — Z79899 Other long term (current) drug therapy: Secondary | ICD-10-CM | POA: Diagnosis not present

## 2022-09-23 DIAGNOSIS — G20A1 Parkinson's disease without dyskinesia, without mention of fluctuations: Secondary | ICD-10-CM | POA: Diagnosis not present

## 2022-10-07 ENCOUNTER — Ambulatory Visit: Payer: Medicare Other | Admitting: Neurology

## 2022-10-07 NOTE — Progress Notes (Deleted)
Patient: Alexander Thomas Date of Birth: 1946-04-17  Reason for Visit: Follow up History from: Patient Primary Neurologist: Terrace Arabia   ASSESSMENT AND PLAN 77 y.o. year old male   1.  Parkinson's disease 2.  Hyper sialorrhea 3.  History of polymyositis  HISTORY  Alexander Thomas is a 77 years old right-handed male, accompanied by his wife, seen in refer by his primary care physician Dr. Pearson Grippe for evaluation of right hand tremor, Initial evaluation was in August 2016   I have reviewed and summarized his most recent office note   He had a past medical history of hyperlipidemia, Raynaud's disease,, had left carpal tunnel release surgery, right wrist fracture surgery, polymyositis,  left deltoid muscle biopsy consistent with fiber atrophy, perivasculitis in August 2002, at its worst, he had difficulty standing up, need his wife help him dressing, he has taken methotrexate along with low-dose prednisone for many years, in 2015, he was switched to Imuran 50 mg twice a day, most recent CPK level was 83, he denies significant muscle weakness now   I also reviewed laboratory results in July 2016, normal CMP, CBC, CPK 83, mild elevated LDL 134, normal ESR 6, hepatitis panel was negative   He is a retired Optician, dispensing, since 2014, he noticed mild right hand tremor, getting worse gradually, there was no significant gait difficulty, mild lack of facial expression, he has lack of smell, which has been a chronic issues, he denies REM sleep disorder, mild constipations.   Update February 04 2015: He was diagnosed with mild Parkinson's disease, was started on Azilect since August 2016, does not notice any symptomatic improvement either,   MRI of the brain without contrast in August 2016, mild supratentorium small vessel disease, no acute lesions.   UPDATE May 07 2015: He was started on requip xr titrating dose since October 2016, He still has mild right hand shaking, left hand is less, he is now taking  Azilect 1 mg every day, requip xr 2 mg 2 tablets every night at 8pm.  He did not noticed any significant side effect.  It does help his tremor   He denies muscle weakness or muscle pain, he had a history of inflammatory polymyositis   UPDATE July 17th 2017: He is taking Azilect 1mg  qday, he tolerated requip xL 4mg  2 tabs po qhs well, he still has tremor, he sleeps well, he lost sense of smell, he has no significant gait abnormality, he has ok appetite, he exercise regularly,  5-10 mintues sit ups at that time, he denies obsessive compulsive behavior.   Update August 10 2016: Parkinson's Disease,  He is now taking ropinirole XL 4 mg 4 tablets every night, Azilect 1 mg every morning, there was no significant fluctuation noticed, he ambulate without much difficulty, does has intermittent right hand resting tremor,   Inflammatory polymyositis Imuran 50 mg 2 tablets every day He is doing well, still has significant right hand tremor,    UPDATE November 16 2017: He feels fine, continue to pastor a smaller church, taking Requip xl 61m 4 tabs qhs, azilect 1mg  daily, imuran 50mg  2 tabs every day, he denies significant muscle weakness, stated he can walk better, but has mild increased drooling, he denies significant memory loss,   UPDATE Feb 6th 2020: He is accompanied by his wife at today's clinical visit, missing his medication frequently, slow worsening memory loss, Today's Moca examination is 23 out of 30 no significant muscle weakness, He still passed her small  church of elderly attendants   UPDATE Aug 29 2019: He has constant right hand tremor, 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, was noted by his wife has increased gait abnormality, mild memory loss, MoCA examination is 21/30 today   He also complains of significant low back pain, stay at midline, unbearable sometimes, especially after prolonged standing or walking   Sleep study in October 2020 showed no significant obstructive sleep apnea, he has  frequent nocturia, wake up 3-4 times each night, usually able to go back to sleep without much difficulty   Update March 26, 2020 Wife concerned about his weight loss, he 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   Update July 30, 2020 He fell broke his left elbow require surgery on June 02, 2020, also suffered a left pelvic fracture, now just began to weightbearing, he stayed at nursing home since then, he tried to limit his water intake worried about going to the bathroom   He was noted to have orthostatic dizziness, hypotension, today he did not have significant blood pressure drop getting up from seated position, but there was significant orthostatic tachycardia   Blood pressure sitting down 122/75/80; standing up 115/64, heart rate of 96; standing up for 1 minute 122/72, heart rate of 96; standing up to 3 minutes 128/77, heart rate of 103, is mildly symptomatic,   UPDATE December 02 2020: He is accompanied by his wife at today's visit, overall stable, he spent most time sitting down, still gives sermon once a week, chronic taking Sinemet 25/100 mg 1 tablet in the morning, because mostly sedentary, he does not complains of orthostatic dizziness getting home, today sitting down blood pressure 120/70, standing up 110/70, he did not complains of orthostatic symptoms, but apparently he has increased gait abnormality, was also undermedicated with Sinemet   Update June 10, 2021 SS: Still pastor of church, 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 Just finished PT/ST/OT, was very helpful, going back in summer. Feels like freezing, gets stuck with walking, think his way out of it, can utilize his therapy tactics. Resting tremor left hand. Uses cane, walking. Only takes  Sinemet once daily. In ER last week for left hip and left knee pain. Eats well, sleeping good. Mild drooling    Update November 04, 2021 SS: [facility] 21/30, here with his wife. Only taking Sinemet 25/100 1 tablet daily in AM, didn't increase to 3 times daily. Claims when taking higher dose had nausea or hypotension. Some visual hallucinations of floor moving, someone sitting on fireplace basket. Sleeps like a baby. Few falls in the yard, able to get back up. Using walker. Stands at [employer] for 30 min on Sunday. Starting PT/OT/ST in August. Has urinary urgency/frequency.  Dr. 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 Terrace Arabia reduce Imuran to 50 mg daily, CBC, CMP, TSH, CK were normal.  REVIEW OF SYSTEMS: Out of a complete 14 system review of symptoms, the patient complains only of the following symptoms, and all other reviewed systems are negative.  See HPI  ALLERGIES: No Known Allergies  HOME MEDICATIONS: Outpatient Medications Prior to Visit  Medication Sig Dispense Refill   azaTHIOprine (IMURAN) 50 MG tablet Take 1 tablet (50 mg total) by mouth in the morning and at bedtime. (Patient taking differently: Take 50 mg by mouth daily.) 180 tablet 4   carbidopa-levodopa (SINEMET IR) 25-100 MG tablet Take one tablet three times daily. 270 tablet 4   COD LIVER OIL PO Take by mouth.     doxycycline (VIBRAMYCIN) 100 MG capsule as needed.   3    gabapentin (NEURONTIN) 300 MG capsule as needed.      magnesium hydroxide (MILK OF MAGNESIA) 400 MG/5ML suspension Take 5 mLs by mouth as needed for mild constipation.     rasagiline (AZILECT) 1 MG TABS tablet Take 1 tablet (1 mg total) by mouth daily. 90 tablet 3   No facility-administered medications prior to visit.    PAST MEDICAL HISTORY: Past Medical History:  Diagnosis Date   Hypercholesteremia    Migraine    Polymyositis (HCC)    Raynaud disease    Tremor of right hand     PAST SURGICAL HISTORY: Past Surgical History:  Procedure Laterality Date   CARPAL TUNNEL RELEASE Left    INGUINAL HERNIA REPAIR Bilateral    WRIST SURGERY Right     FAMILY HISTORY: Family History  Problem Relation Age of Onset   Uterine cancer Mother    Healthy Father    Kidney cancer Brother    Colon cancer Brother     SOCIAL HISTORY: Social History   Socioeconomic History   Marital status: Married    Spouse name: Dollie   Number of children: 2   Years of education: PhD   Highest education level: Not on file  Occupational History   Occupation: Optician, dispensing  Tobacco Use   Smoking status: Never   Smokeless tobacco: Never  Substance and Sexual Activity   Alcohol use: No    Alcohol/week: 0.0 standard drinks of alcohol   Drug use: No   Sexual activity: Not on file  Other Topics Concern   Not on file  Social History Narrative   Lives at home with his wife.   Right-handed.   1 cup caffeine daily.   Social Determinants of Health   Financial Resource Strain: Not on file  Food Insecurity: Not on file  Transportation Needs: Not on file  Physical Activity: Not on file  Stress: Not on file  Social Connections: Not on file  Intimate Partner Violence: Not on file    PHYSICAL EXAM  There were no vitals filed for this visit. There is no height or weight on file to calculate BMI.  Generalized: Well developed, in no acute distress  Neurological examination  Mentation: Alert oriented to  time, place, history taking. Follows all commands speech and language fluent Cranial nerve II-XII: Pupils were equal round reactive to light. Extraocular movements were full, visual field were full on confrontational test. Facial sensation and strength were normal. Uvula tongue midline. Head turning and shoulder shrug  were normal and symmetric. Motor: The motor testing reveals 5 over 5 strength of all 4 extremities. Good symmetric motor tone is noted throughout.  Sensory: Sensory  testing is intact to soft touch on all 4 extremities. No evidence of extinction is noted.  Coordination: Cerebellar testing reveals good finger-nose-finger and heel-to-shin bilaterally.  Gait and station: Gait is normal. Tandem gait is normal. Romberg is negative. No drift is seen.  Reflexes: Deep tendon reflexes are symmetric and normal bilaterally.   DIAGNOSTIC DATA (LABS, IMAGING, TESTING) - I reviewed patient records, labs, notes, testing and imaging myself where available.  Lab Results  Component Value Date   WBC 7.2 04/06/2022   HGB 13.9 04/06/2022   HCT 41.3 04/06/2022   MCV 91 04/06/2022   PLT 215 04/06/2022      Component Value Date/Time   NA 142 04/06/2022 1230   K 4.8 04/06/2022 1230   CL 103 04/06/2022 1230   CO2 27 04/06/2022 1230   GLUCOSE 86 04/06/2022 1230   BUN 21 04/06/2022 1230   CREATININE 0.85 04/06/2022 1230   CALCIUM 9.2 04/06/2022 1230   PROT 6.8 04/06/2022 1230   ALBUMIN 4.0 04/06/2022 1230   AST 15 04/06/2022 1230   ALT 10 04/06/2022 1230   ALKPHOS 77 04/06/2022 1230   BILITOT 0.2 04/06/2022 1230   GFRNONAA 87 03/26/2020 1420   GFRAA 100 03/26/2020 1420   No results found for: "CHOL", "HDL", "LDLCALC", "LDLDIRECT", "TRIG", "CHOLHDL" No results found for: "HGBA1C" No results found for: "VITAMINB12" Lab Results  Component Value Date   TSH 1.530 04/06/2022    Margie Ege, AGNP-C, DNP 10/07/2022, 5:51 AM [facility] [address] [place], [place] [address] [phone]

## 2022-10-21 DIAGNOSIS — D485 Neoplasm of uncertain behavior of skin: Secondary | ICD-10-CM | POA: Diagnosis not present

## 2022-10-21 DIAGNOSIS — L538 Other specified erythematous conditions: Secondary | ICD-10-CM | POA: Diagnosis not present

## 2022-10-21 DIAGNOSIS — C44319 Basal cell carcinoma of skin of other parts of face: Secondary | ICD-10-CM | POA: Diagnosis not present

## 2022-10-21 DIAGNOSIS — L821 Other seborrheic keratosis: Secondary | ICD-10-CM | POA: Diagnosis not present

## 2022-10-21 DIAGNOSIS — R238 Other skin changes: Secondary | ICD-10-CM | POA: Diagnosis not present

## 2022-10-21 DIAGNOSIS — L718 Other rosacea: Secondary | ICD-10-CM | POA: Diagnosis not present

## 2022-10-21 DIAGNOSIS — Z7189 Other specified counseling: Secondary | ICD-10-CM | POA: Diagnosis not present

## 2022-10-21 DIAGNOSIS — L02223 Furuncle of chest wall: Secondary | ICD-10-CM | POA: Diagnosis not present

## 2022-10-21 DIAGNOSIS — L02222 Furuncle of back [any part, except buttock]: Secondary | ICD-10-CM | POA: Diagnosis not present

## 2022-10-21 DIAGNOSIS — B078 Other viral warts: Secondary | ICD-10-CM | POA: Diagnosis not present

## 2022-10-21 DIAGNOSIS — L814 Other melanin hyperpigmentation: Secondary | ICD-10-CM | POA: Diagnosis not present

## 2022-10-21 DIAGNOSIS — L570 Actinic keratosis: Secondary | ICD-10-CM | POA: Diagnosis not present

## 2022-11-19 DIAGNOSIS — C44319 Basal cell carcinoma of skin of other parts of face: Secondary | ICD-10-CM | POA: Diagnosis not present

## 2022-11-19 DIAGNOSIS — L905 Scar conditions and fibrosis of skin: Secondary | ICD-10-CM | POA: Diagnosis not present

## 2022-11-23 DIAGNOSIS — R531 Weakness: Secondary | ICD-10-CM | POA: Diagnosis not present

## 2022-11-23 DIAGNOSIS — D510 Vitamin B12 deficiency anemia due to intrinsic factor deficiency: Secondary | ICD-10-CM | POA: Diagnosis not present

## 2022-11-23 DIAGNOSIS — G20C Parkinsonism, unspecified: Secondary | ICD-10-CM | POA: Diagnosis not present

## 2022-11-23 DIAGNOSIS — E02 Subclinical iodine-deficiency hypothyroidism: Secondary | ICD-10-CM | POA: Diagnosis not present

## 2022-12-30 DIAGNOSIS — Z136 Encounter for screening for cardiovascular disorders: Secondary | ICD-10-CM | POA: Diagnosis not present

## 2022-12-30 DIAGNOSIS — M3320 Polymyositis, organ involvement unspecified: Secondary | ICD-10-CM | POA: Diagnosis not present

## 2022-12-30 DIAGNOSIS — E559 Vitamin D deficiency, unspecified: Secondary | ICD-10-CM | POA: Diagnosis not present

## 2022-12-30 DIAGNOSIS — Z6825 Body mass index (BMI) 25.0-25.9, adult: Secondary | ICD-10-CM | POA: Diagnosis not present

## 2022-12-30 DIAGNOSIS — G20A1 Parkinson's disease without dyskinesia, without mention of fluctuations: Secondary | ICD-10-CM | POA: Diagnosis not present

## 2022-12-30 DIAGNOSIS — Z79899 Other long term (current) drug therapy: Secondary | ICD-10-CM | POA: Diagnosis not present

## 2022-12-30 DIAGNOSIS — Z125 Encounter for screening for malignant neoplasm of prostate: Secondary | ICD-10-CM | POA: Diagnosis not present

## 2022-12-30 DIAGNOSIS — E663 Overweight: Secondary | ICD-10-CM | POA: Diagnosis not present

## 2022-12-31 DIAGNOSIS — Z79899 Other long term (current) drug therapy: Secondary | ICD-10-CM | POA: Diagnosis not present

## 2022-12-31 DIAGNOSIS — Z136 Encounter for screening for cardiovascular disorders: Secondary | ICD-10-CM | POA: Diagnosis not present

## 2022-12-31 DIAGNOSIS — E559 Vitamin D deficiency, unspecified: Secondary | ICD-10-CM | POA: Diagnosis not present

## 2022-12-31 DIAGNOSIS — Z125 Encounter for screening for malignant neoplasm of prostate: Secondary | ICD-10-CM | POA: Diagnosis not present

## 2023-01-21 DIAGNOSIS — L089 Local infection of the skin and subcutaneous tissue, unspecified: Secondary | ICD-10-CM | POA: Diagnosis not present

## 2023-01-21 DIAGNOSIS — Z85828 Personal history of other malignant neoplasm of skin: Secondary | ICD-10-CM | POA: Diagnosis not present

## 2023-01-21 DIAGNOSIS — Z7189 Other specified counseling: Secondary | ICD-10-CM | POA: Diagnosis not present

## 2023-01-21 DIAGNOSIS — L570 Actinic keratosis: Secondary | ICD-10-CM | POA: Diagnosis not present

## 2023-01-21 DIAGNOSIS — L0292 Furuncle, unspecified: Secondary | ICD-10-CM | POA: Diagnosis not present

## 2023-01-26 DIAGNOSIS — D84821 Immunodeficiency due to drugs: Secondary | ICD-10-CM | POA: Diagnosis not present

## 2023-01-26 DIAGNOSIS — G20A1 Parkinson's disease without dyskinesia, without mention of fluctuations: Secondary | ICD-10-CM | POA: Diagnosis not present

## 2023-01-26 DIAGNOSIS — Z79899 Other long term (current) drug therapy: Secondary | ICD-10-CM | POA: Diagnosis not present

## 2023-01-26 DIAGNOSIS — M3320 Polymyositis, organ involvement unspecified: Secondary | ICD-10-CM | POA: Diagnosis not present

## 2023-01-27 DIAGNOSIS — D84821 Immunodeficiency due to drugs: Secondary | ICD-10-CM | POA: Diagnosis not present

## 2023-01-27 DIAGNOSIS — Z79899 Other long term (current) drug therapy: Secondary | ICD-10-CM | POA: Diagnosis not present

## 2023-01-27 DIAGNOSIS — M3320 Polymyositis, organ involvement unspecified: Secondary | ICD-10-CM | POA: Diagnosis not present

## 2023-02-05 DIAGNOSIS — J984 Other disorders of lung: Secondary | ICD-10-CM | POA: Diagnosis not present

## 2023-02-06 DIAGNOSIS — R918 Other nonspecific abnormal finding of lung field: Secondary | ICD-10-CM | POA: Diagnosis not present

## 2023-02-11 DIAGNOSIS — M810 Age-related osteoporosis without current pathological fracture: Secondary | ICD-10-CM | POA: Diagnosis not present

## 2023-02-15 DIAGNOSIS — L02424 Furuncle of left upper limb: Secondary | ICD-10-CM | POA: Diagnosis not present

## 2023-02-15 DIAGNOSIS — L02222 Furuncle of back [any part, except buttock]: Secondary | ICD-10-CM | POA: Diagnosis not present

## 2023-02-15 DIAGNOSIS — L0292 Furuncle, unspecified: Secondary | ICD-10-CM | POA: Diagnosis not present

## 2023-02-15 DIAGNOSIS — L570 Actinic keratosis: Secondary | ICD-10-CM | POA: Diagnosis not present

## 2023-02-15 DIAGNOSIS — L02423 Furuncle of right upper limb: Secondary | ICD-10-CM | POA: Diagnosis not present

## 2023-02-15 DIAGNOSIS — L821 Other seborrheic keratosis: Secondary | ICD-10-CM | POA: Diagnosis not present

## 2023-02-15 DIAGNOSIS — L02223 Furuncle of chest wall: Secondary | ICD-10-CM | POA: Diagnosis not present

## 2023-03-01 DIAGNOSIS — J849 Interstitial pulmonary disease, unspecified: Secondary | ICD-10-CM | POA: Diagnosis not present

## 2023-03-22 DIAGNOSIS — L570 Actinic keratosis: Secondary | ICD-10-CM | POA: Diagnosis not present

## 2023-03-22 DIAGNOSIS — L02821 Furuncle of head [any part, except face]: Secondary | ICD-10-CM | POA: Diagnosis not present

## 2023-03-22 DIAGNOSIS — L0212 Furuncle of neck: Secondary | ICD-10-CM | POA: Diagnosis not present

## 2023-03-31 DIAGNOSIS — G20C Parkinsonism, unspecified: Secondary | ICD-10-CM | POA: Diagnosis not present

## 2023-04-05 DIAGNOSIS — E041 Nontoxic single thyroid nodule: Secondary | ICD-10-CM | POA: Diagnosis not present

## 2023-04-06 DIAGNOSIS — K5900 Constipation, unspecified: Secondary | ICD-10-CM | POA: Diagnosis not present

## 2023-04-06 DIAGNOSIS — Z85828 Personal history of other malignant neoplasm of skin: Secondary | ICD-10-CM | POA: Diagnosis not present

## 2023-04-06 DIAGNOSIS — F02A0 Dementia in other diseases classified elsewhere, mild, without behavioral disturbance, psychotic disturbance, mood disturbance, and anxiety: Secondary | ICD-10-CM | POA: Diagnosis not present

## 2023-04-06 DIAGNOSIS — M3320 Polymyositis, organ involvement unspecified: Secondary | ICD-10-CM | POA: Diagnosis not present

## 2023-04-06 DIAGNOSIS — G20A1 Parkinson's disease without dyskinesia, without mention of fluctuations: Secondary | ICD-10-CM | POA: Diagnosis not present

## 2023-04-11 DIAGNOSIS — M3320 Polymyositis, organ involvement unspecified: Secondary | ICD-10-CM | POA: Diagnosis not present

## 2023-04-11 DIAGNOSIS — G20A1 Parkinson's disease without dyskinesia, without mention of fluctuations: Secondary | ICD-10-CM | POA: Diagnosis not present

## 2023-04-11 DIAGNOSIS — K5900 Constipation, unspecified: Secondary | ICD-10-CM | POA: Diagnosis not present

## 2023-04-11 DIAGNOSIS — Z85828 Personal history of other malignant neoplasm of skin: Secondary | ICD-10-CM | POA: Diagnosis not present

## 2023-04-11 DIAGNOSIS — F02A0 Dementia in other diseases classified elsewhere, mild, without behavioral disturbance, psychotic disturbance, mood disturbance, and anxiety: Secondary | ICD-10-CM | POA: Diagnosis not present

## 2023-04-19 DIAGNOSIS — F02A0 Dementia in other diseases classified elsewhere, mild, without behavioral disturbance, psychotic disturbance, mood disturbance, and anxiety: Secondary | ICD-10-CM | POA: Diagnosis not present

## 2023-04-19 DIAGNOSIS — K5900 Constipation, unspecified: Secondary | ICD-10-CM | POA: Diagnosis not present

## 2023-04-19 DIAGNOSIS — Z85828 Personal history of other malignant neoplasm of skin: Secondary | ICD-10-CM | POA: Diagnosis not present

## 2023-04-19 DIAGNOSIS — G20A1 Parkinson's disease without dyskinesia, without mention of fluctuations: Secondary | ICD-10-CM | POA: Diagnosis not present

## 2023-04-19 DIAGNOSIS — M3320 Polymyositis, organ involvement unspecified: Secondary | ICD-10-CM | POA: Diagnosis not present

## 2023-04-26 DIAGNOSIS — G20A1 Parkinson's disease without dyskinesia, without mention of fluctuations: Secondary | ICD-10-CM | POA: Diagnosis not present

## 2023-04-26 DIAGNOSIS — F02A0 Dementia in other diseases classified elsewhere, mild, without behavioral disturbance, psychotic disturbance, mood disturbance, and anxiety: Secondary | ICD-10-CM | POA: Diagnosis not present

## 2023-04-26 DIAGNOSIS — K5900 Constipation, unspecified: Secondary | ICD-10-CM | POA: Diagnosis not present

## 2023-04-26 DIAGNOSIS — M3320 Polymyositis, organ involvement unspecified: Secondary | ICD-10-CM | POA: Diagnosis not present

## 2023-04-26 DIAGNOSIS — Z85828 Personal history of other malignant neoplasm of skin: Secondary | ICD-10-CM | POA: Diagnosis not present

## 2023-05-05 DIAGNOSIS — K5900 Constipation, unspecified: Secondary | ICD-10-CM | POA: Diagnosis not present

## 2023-05-05 DIAGNOSIS — Z85828 Personal history of other malignant neoplasm of skin: Secondary | ICD-10-CM | POA: Diagnosis not present

## 2023-05-05 DIAGNOSIS — F02A0 Dementia in other diseases classified elsewhere, mild, without behavioral disturbance, psychotic disturbance, mood disturbance, and anxiety: Secondary | ICD-10-CM | POA: Diagnosis not present

## 2023-05-05 DIAGNOSIS — G20A1 Parkinson's disease without dyskinesia, without mention of fluctuations: Secondary | ICD-10-CM | POA: Diagnosis not present

## 2023-05-05 DIAGNOSIS — M3320 Polymyositis, organ involvement unspecified: Secondary | ICD-10-CM | POA: Diagnosis not present

## 2023-05-06 DIAGNOSIS — Z85828 Personal history of other malignant neoplasm of skin: Secondary | ICD-10-CM | POA: Diagnosis not present

## 2023-05-06 DIAGNOSIS — G20A1 Parkinson's disease without dyskinesia, without mention of fluctuations: Secondary | ICD-10-CM | POA: Diagnosis not present

## 2023-05-06 DIAGNOSIS — K5900 Constipation, unspecified: Secondary | ICD-10-CM | POA: Diagnosis not present

## 2023-05-06 DIAGNOSIS — M3320 Polymyositis, organ involvement unspecified: Secondary | ICD-10-CM | POA: Diagnosis not present

## 2023-05-06 DIAGNOSIS — F02A0 Dementia in other diseases classified elsewhere, mild, without behavioral disturbance, psychotic disturbance, mood disturbance, and anxiety: Secondary | ICD-10-CM | POA: Diagnosis not present

## 2023-05-10 DIAGNOSIS — K5900 Constipation, unspecified: Secondary | ICD-10-CM | POA: Diagnosis not present

## 2023-05-10 DIAGNOSIS — F02A0 Dementia in other diseases classified elsewhere, mild, without behavioral disturbance, psychotic disturbance, mood disturbance, and anxiety: Secondary | ICD-10-CM | POA: Diagnosis not present

## 2023-05-10 DIAGNOSIS — G20A1 Parkinson's disease without dyskinesia, without mention of fluctuations: Secondary | ICD-10-CM | POA: Diagnosis not present

## 2023-05-10 DIAGNOSIS — M3320 Polymyositis, organ involvement unspecified: Secondary | ICD-10-CM | POA: Diagnosis not present

## 2023-05-10 DIAGNOSIS — Z85828 Personal history of other malignant neoplasm of skin: Secondary | ICD-10-CM | POA: Diagnosis not present

## 2023-05-11 DIAGNOSIS — M3320 Polymyositis, organ involvement unspecified: Secondary | ICD-10-CM | POA: Diagnosis not present

## 2023-05-11 DIAGNOSIS — G20A1 Parkinson's disease without dyskinesia, without mention of fluctuations: Secondary | ICD-10-CM | POA: Diagnosis not present

## 2023-05-11 DIAGNOSIS — F02A0 Dementia in other diseases classified elsewhere, mild, without behavioral disturbance, psychotic disturbance, mood disturbance, and anxiety: Secondary | ICD-10-CM | POA: Diagnosis not present

## 2023-05-11 DIAGNOSIS — K5900 Constipation, unspecified: Secondary | ICD-10-CM | POA: Diagnosis not present

## 2023-05-11 DIAGNOSIS — Z85828 Personal history of other malignant neoplasm of skin: Secondary | ICD-10-CM | POA: Diagnosis not present

## 2023-05-18 DIAGNOSIS — K5900 Constipation, unspecified: Secondary | ICD-10-CM | POA: Diagnosis not present

## 2023-05-18 DIAGNOSIS — Z85828 Personal history of other malignant neoplasm of skin: Secondary | ICD-10-CM | POA: Diagnosis not present

## 2023-05-18 DIAGNOSIS — M3320 Polymyositis, organ involvement unspecified: Secondary | ICD-10-CM | POA: Diagnosis not present

## 2023-05-18 DIAGNOSIS — F02A0 Dementia in other diseases classified elsewhere, mild, without behavioral disturbance, psychotic disturbance, mood disturbance, and anxiety: Secondary | ICD-10-CM | POA: Diagnosis not present

## 2023-05-18 DIAGNOSIS — G20A1 Parkinson's disease without dyskinesia, without mention of fluctuations: Secondary | ICD-10-CM | POA: Diagnosis not present

## 2023-05-19 DIAGNOSIS — F02A0 Dementia in other diseases classified elsewhere, mild, without behavioral disturbance, psychotic disturbance, mood disturbance, and anxiety: Secondary | ICD-10-CM | POA: Diagnosis not present

## 2023-05-19 DIAGNOSIS — Z85828 Personal history of other malignant neoplasm of skin: Secondary | ICD-10-CM | POA: Diagnosis not present

## 2023-05-19 DIAGNOSIS — K5900 Constipation, unspecified: Secondary | ICD-10-CM | POA: Diagnosis not present

## 2023-05-19 DIAGNOSIS — G20A1 Parkinson's disease without dyskinesia, without mention of fluctuations: Secondary | ICD-10-CM | POA: Diagnosis not present

## 2023-05-19 DIAGNOSIS — M3320 Polymyositis, organ involvement unspecified: Secondary | ICD-10-CM | POA: Diagnosis not present

## 2023-05-20 DIAGNOSIS — G20C Parkinsonism, unspecified: Secondary | ICD-10-CM | POA: Diagnosis not present

## 2023-05-24 DIAGNOSIS — K5900 Constipation, unspecified: Secondary | ICD-10-CM | POA: Diagnosis not present

## 2023-05-24 DIAGNOSIS — G20A1 Parkinson's disease without dyskinesia, without mention of fluctuations: Secondary | ICD-10-CM | POA: Diagnosis not present

## 2023-05-24 DIAGNOSIS — M3320 Polymyositis, organ involvement unspecified: Secondary | ICD-10-CM | POA: Diagnosis not present

## 2023-05-24 DIAGNOSIS — Z85828 Personal history of other malignant neoplasm of skin: Secondary | ICD-10-CM | POA: Diagnosis not present

## 2023-05-24 DIAGNOSIS — F02A0 Dementia in other diseases classified elsewhere, mild, without behavioral disturbance, psychotic disturbance, mood disturbance, and anxiety: Secondary | ICD-10-CM | POA: Diagnosis not present

## 2023-05-31 DIAGNOSIS — Z79899 Other long term (current) drug therapy: Secondary | ICD-10-CM | POA: Diagnosis not present

## 2023-05-31 DIAGNOSIS — E559 Vitamin D deficiency, unspecified: Secondary | ICD-10-CM | POA: Diagnosis not present

## 2023-05-31 DIAGNOSIS — R5383 Other fatigue: Secondary | ICD-10-CM | POA: Diagnosis not present

## 2023-05-31 DIAGNOSIS — M810 Age-related osteoporosis without current pathological fracture: Secondary | ICD-10-CM | POA: Diagnosis not present

## 2023-05-31 DIAGNOSIS — M150 Primary generalized (osteo)arthritis: Secondary | ICD-10-CM | POA: Diagnosis not present

## 2023-05-31 DIAGNOSIS — I7300 Raynaud's syndrome without gangrene: Secondary | ICD-10-CM | POA: Diagnosis not present

## 2023-05-31 DIAGNOSIS — M064 Inflammatory polyarthropathy: Secondary | ICD-10-CM | POA: Diagnosis not present

## 2023-05-31 DIAGNOSIS — M3322 Polymyositis with myopathy: Secondary | ICD-10-CM | POA: Diagnosis not present

## 2023-05-31 DIAGNOSIS — M791 Myalgia, unspecified site: Secondary | ICD-10-CM | POA: Diagnosis not present

## 2023-06-01 DIAGNOSIS — F02A0 Dementia in other diseases classified elsewhere, mild, without behavioral disturbance, psychotic disturbance, mood disturbance, and anxiety: Secondary | ICD-10-CM | POA: Diagnosis not present

## 2023-06-01 DIAGNOSIS — K5900 Constipation, unspecified: Secondary | ICD-10-CM | POA: Diagnosis not present

## 2023-06-01 DIAGNOSIS — R059 Cough, unspecified: Secondary | ICD-10-CM | POA: Diagnosis not present

## 2023-06-01 DIAGNOSIS — G20A1 Parkinson's disease without dyskinesia, without mention of fluctuations: Secondary | ICD-10-CM | POA: Diagnosis not present

## 2023-06-01 DIAGNOSIS — J849 Interstitial pulmonary disease, unspecified: Secondary | ICD-10-CM | POA: Diagnosis not present

## 2023-06-01 DIAGNOSIS — M3320 Polymyositis, organ involvement unspecified: Secondary | ICD-10-CM | POA: Diagnosis not present

## 2023-06-01 DIAGNOSIS — Z85828 Personal history of other malignant neoplasm of skin: Secondary | ICD-10-CM | POA: Diagnosis not present

## 2023-06-03 DIAGNOSIS — M3320 Polymyositis, organ involvement unspecified: Secondary | ICD-10-CM | POA: Diagnosis not present

## 2023-06-03 DIAGNOSIS — F02A0 Dementia in other diseases classified elsewhere, mild, without behavioral disturbance, psychotic disturbance, mood disturbance, and anxiety: Secondary | ICD-10-CM | POA: Diagnosis not present

## 2023-06-03 DIAGNOSIS — K5900 Constipation, unspecified: Secondary | ICD-10-CM | POA: Diagnosis not present

## 2023-06-03 DIAGNOSIS — G20A1 Parkinson's disease without dyskinesia, without mention of fluctuations: Secondary | ICD-10-CM | POA: Diagnosis not present

## 2023-06-03 DIAGNOSIS — Z85828 Personal history of other malignant neoplasm of skin: Secondary | ICD-10-CM | POA: Diagnosis not present

## 2023-06-05 DIAGNOSIS — K5900 Constipation, unspecified: Secondary | ICD-10-CM | POA: Diagnosis not present

## 2023-06-05 DIAGNOSIS — Z85828 Personal history of other malignant neoplasm of skin: Secondary | ICD-10-CM | POA: Diagnosis not present

## 2023-06-05 DIAGNOSIS — G20A1 Parkinson's disease without dyskinesia, without mention of fluctuations: Secondary | ICD-10-CM | POA: Diagnosis not present

## 2023-06-05 DIAGNOSIS — F02A0 Dementia in other diseases classified elsewhere, mild, without behavioral disturbance, psychotic disturbance, mood disturbance, and anxiety: Secondary | ICD-10-CM | POA: Diagnosis not present

## 2023-06-05 DIAGNOSIS — M3320 Polymyositis, organ involvement unspecified: Secondary | ICD-10-CM | POA: Diagnosis not present

## 2023-06-09 DIAGNOSIS — M3322 Polymyositis with myopathy: Secondary | ICD-10-CM | POA: Diagnosis not present

## 2023-06-09 DIAGNOSIS — I4891 Unspecified atrial fibrillation: Secondary | ICD-10-CM | POA: Diagnosis not present

## 2023-06-09 DIAGNOSIS — K5900 Constipation, unspecified: Secondary | ICD-10-CM | POA: Diagnosis not present

## 2023-06-09 DIAGNOSIS — E663 Overweight: Secondary | ICD-10-CM | POA: Diagnosis not present

## 2023-06-09 DIAGNOSIS — Z85828 Personal history of other malignant neoplasm of skin: Secondary | ICD-10-CM | POA: Diagnosis not present

## 2023-06-09 DIAGNOSIS — Z79899 Other long term (current) drug therapy: Secondary | ICD-10-CM | POA: Diagnosis not present

## 2023-06-09 DIAGNOSIS — J479 Bronchiectasis, uncomplicated: Secondary | ICD-10-CM | POA: Diagnosis not present

## 2023-06-09 DIAGNOSIS — G20A1 Parkinson's disease without dyskinesia, without mention of fluctuations: Secondary | ICD-10-CM | POA: Diagnosis not present

## 2023-06-09 DIAGNOSIS — Z1159 Encounter for screening for other viral diseases: Secondary | ICD-10-CM | POA: Diagnosis not present

## 2023-06-09 DIAGNOSIS — Z23 Encounter for immunization: Secondary | ICD-10-CM | POA: Diagnosis not present

## 2023-06-09 DIAGNOSIS — M3320 Polymyositis, organ involvement unspecified: Secondary | ICD-10-CM | POA: Diagnosis not present

## 2023-06-09 DIAGNOSIS — Z0000 Encounter for general adult medical examination without abnormal findings: Secondary | ICD-10-CM | POA: Diagnosis not present

## 2023-06-09 DIAGNOSIS — L819 Disorder of pigmentation, unspecified: Secondary | ICD-10-CM | POA: Diagnosis not present

## 2023-06-09 DIAGNOSIS — Z0001 Encounter for general adult medical examination with abnormal findings: Secondary | ICD-10-CM | POA: Diagnosis not present

## 2023-06-09 DIAGNOSIS — Z7189 Other specified counseling: Secondary | ICD-10-CM | POA: Diagnosis not present

## 2023-06-09 DIAGNOSIS — F02A0 Dementia in other diseases classified elsewhere, mild, without behavioral disturbance, psychotic disturbance, mood disturbance, and anxiety: Secondary | ICD-10-CM | POA: Diagnosis not present

## 2023-06-10 DIAGNOSIS — K5900 Constipation, unspecified: Secondary | ICD-10-CM | POA: Diagnosis not present

## 2023-06-10 DIAGNOSIS — F02A0 Dementia in other diseases classified elsewhere, mild, without behavioral disturbance, psychotic disturbance, mood disturbance, and anxiety: Secondary | ICD-10-CM | POA: Diagnosis not present

## 2023-06-10 DIAGNOSIS — Z85828 Personal history of other malignant neoplasm of skin: Secondary | ICD-10-CM | POA: Diagnosis not present

## 2023-06-10 DIAGNOSIS — M3320 Polymyositis, organ involvement unspecified: Secondary | ICD-10-CM | POA: Diagnosis not present

## 2023-06-10 DIAGNOSIS — G20A1 Parkinson's disease without dyskinesia, without mention of fluctuations: Secondary | ICD-10-CM | POA: Diagnosis not present

## 2023-06-15 DIAGNOSIS — K5900 Constipation, unspecified: Secondary | ICD-10-CM | POA: Diagnosis not present

## 2023-06-15 DIAGNOSIS — F02A0 Dementia in other diseases classified elsewhere, mild, without behavioral disturbance, psychotic disturbance, mood disturbance, and anxiety: Secondary | ICD-10-CM | POA: Diagnosis not present

## 2023-06-15 DIAGNOSIS — G20A1 Parkinson's disease without dyskinesia, without mention of fluctuations: Secondary | ICD-10-CM | POA: Diagnosis not present

## 2023-06-15 DIAGNOSIS — M3320 Polymyositis, organ involvement unspecified: Secondary | ICD-10-CM | POA: Diagnosis not present

## 2023-06-15 DIAGNOSIS — Z85828 Personal history of other malignant neoplasm of skin: Secondary | ICD-10-CM | POA: Diagnosis not present

## 2023-06-18 DIAGNOSIS — M3320 Polymyositis, organ involvement unspecified: Secondary | ICD-10-CM | POA: Diagnosis not present

## 2023-06-18 DIAGNOSIS — F02A0 Dementia in other diseases classified elsewhere, mild, without behavioral disturbance, psychotic disturbance, mood disturbance, and anxiety: Secondary | ICD-10-CM | POA: Diagnosis not present

## 2023-06-18 DIAGNOSIS — K5900 Constipation, unspecified: Secondary | ICD-10-CM | POA: Diagnosis not present

## 2023-06-18 DIAGNOSIS — G20A1 Parkinson's disease without dyskinesia, without mention of fluctuations: Secondary | ICD-10-CM | POA: Diagnosis not present

## 2023-06-18 DIAGNOSIS — Z85828 Personal history of other malignant neoplasm of skin: Secondary | ICD-10-CM | POA: Diagnosis not present

## 2023-06-21 DIAGNOSIS — Z85828 Personal history of other malignant neoplasm of skin: Secondary | ICD-10-CM | POA: Diagnosis not present

## 2023-06-21 DIAGNOSIS — F02A0 Dementia in other diseases classified elsewhere, mild, without behavioral disturbance, psychotic disturbance, mood disturbance, and anxiety: Secondary | ICD-10-CM | POA: Diagnosis not present

## 2023-06-21 DIAGNOSIS — K5900 Constipation, unspecified: Secondary | ICD-10-CM | POA: Diagnosis not present

## 2023-06-21 DIAGNOSIS — G20A1 Parkinson's disease without dyskinesia, without mention of fluctuations: Secondary | ICD-10-CM | POA: Diagnosis not present

## 2023-06-21 DIAGNOSIS — M3320 Polymyositis, organ involvement unspecified: Secondary | ICD-10-CM | POA: Diagnosis not present

## 2023-07-01 DIAGNOSIS — F02A0 Dementia in other diseases classified elsewhere, mild, without behavioral disturbance, psychotic disturbance, mood disturbance, and anxiety: Secondary | ICD-10-CM | POA: Diagnosis not present

## 2023-07-01 DIAGNOSIS — M3320 Polymyositis, organ involvement unspecified: Secondary | ICD-10-CM | POA: Diagnosis not present

## 2023-07-01 DIAGNOSIS — Z85828 Personal history of other malignant neoplasm of skin: Secondary | ICD-10-CM | POA: Diagnosis not present

## 2023-07-01 DIAGNOSIS — G20A1 Parkinson's disease without dyskinesia, without mention of fluctuations: Secondary | ICD-10-CM | POA: Diagnosis not present

## 2023-07-01 DIAGNOSIS — K5900 Constipation, unspecified: Secondary | ICD-10-CM | POA: Diagnosis not present

## 2023-07-05 DIAGNOSIS — G20C Parkinsonism, unspecified: Secondary | ICD-10-CM | POA: Diagnosis not present

## 2023-07-05 DIAGNOSIS — I951 Orthostatic hypotension: Secondary | ICD-10-CM | POA: Diagnosis not present

## 2023-07-05 DIAGNOSIS — Z85828 Personal history of other malignant neoplasm of skin: Secondary | ICD-10-CM | POA: Diagnosis not present

## 2023-07-05 DIAGNOSIS — K5900 Constipation, unspecified: Secondary | ICD-10-CM | POA: Diagnosis not present

## 2023-07-05 DIAGNOSIS — F02A0 Dementia in other diseases classified elsewhere, mild, without behavioral disturbance, psychotic disturbance, mood disturbance, and anxiety: Secondary | ICD-10-CM | POA: Diagnosis not present

## 2023-07-05 DIAGNOSIS — M3320 Polymyositis, organ involvement unspecified: Secondary | ICD-10-CM | POA: Diagnosis not present

## 2023-07-05 DIAGNOSIS — G20A1 Parkinson's disease without dyskinesia, without mention of fluctuations: Secondary | ICD-10-CM | POA: Diagnosis not present

## 2023-07-07 DIAGNOSIS — N400 Enlarged prostate without lower urinary tract symptoms: Secondary | ICD-10-CM | POA: Diagnosis not present

## 2023-07-07 DIAGNOSIS — L739 Follicular disorder, unspecified: Secondary | ICD-10-CM | POA: Diagnosis not present

## 2023-07-07 DIAGNOSIS — G20A1 Parkinson's disease without dyskinesia, without mention of fluctuations: Secondary | ICD-10-CM | POA: Diagnosis not present

## 2023-07-07 DIAGNOSIS — I959 Hypotension, unspecified: Secondary | ICD-10-CM | POA: Diagnosis not present

## 2023-07-07 DIAGNOSIS — R55 Syncope and collapse: Secondary | ICD-10-CM | POA: Diagnosis not present

## 2023-07-07 DIAGNOSIS — I509 Heart failure, unspecified: Secondary | ICD-10-CM | POA: Diagnosis not present

## 2023-07-07 DIAGNOSIS — E663 Overweight: Secondary | ICD-10-CM | POA: Diagnosis not present

## 2023-07-07 DIAGNOSIS — Z6827 Body mass index (BMI) 27.0-27.9, adult: Secondary | ICD-10-CM | POA: Diagnosis not present

## 2023-07-08 DIAGNOSIS — Z85828 Personal history of other malignant neoplasm of skin: Secondary | ICD-10-CM | POA: Diagnosis not present

## 2023-07-08 DIAGNOSIS — F02A0 Dementia in other diseases classified elsewhere, mild, without behavioral disturbance, psychotic disturbance, mood disturbance, and anxiety: Secondary | ICD-10-CM | POA: Diagnosis not present

## 2023-07-08 DIAGNOSIS — G20A1 Parkinson's disease without dyskinesia, without mention of fluctuations: Secondary | ICD-10-CM | POA: Diagnosis not present

## 2023-07-08 DIAGNOSIS — K5900 Constipation, unspecified: Secondary | ICD-10-CM | POA: Diagnosis not present

## 2023-07-08 DIAGNOSIS — M3320 Polymyositis, organ involvement unspecified: Secondary | ICD-10-CM | POA: Diagnosis not present

## 2023-07-13 DIAGNOSIS — K5900 Constipation, unspecified: Secondary | ICD-10-CM | POA: Diagnosis not present

## 2023-07-13 DIAGNOSIS — J849 Interstitial pulmonary disease, unspecified: Secondary | ICD-10-CM | POA: Diagnosis not present

## 2023-07-13 DIAGNOSIS — Z85828 Personal history of other malignant neoplasm of skin: Secondary | ICD-10-CM | POA: Diagnosis not present

## 2023-07-13 DIAGNOSIS — F02A0 Dementia in other diseases classified elsewhere, mild, without behavioral disturbance, psychotic disturbance, mood disturbance, and anxiety: Secondary | ICD-10-CM | POA: Diagnosis not present

## 2023-07-13 DIAGNOSIS — M3320 Polymyositis, organ involvement unspecified: Secondary | ICD-10-CM | POA: Diagnosis not present

## 2023-07-13 DIAGNOSIS — G20A1 Parkinson's disease without dyskinesia, without mention of fluctuations: Secondary | ICD-10-CM | POA: Diagnosis not present

## 2023-07-13 DIAGNOSIS — J479 Bronchiectasis, uncomplicated: Secondary | ICD-10-CM | POA: Diagnosis not present

## 2023-07-21 DIAGNOSIS — G20A1 Parkinson's disease without dyskinesia, without mention of fluctuations: Secondary | ICD-10-CM | POA: Diagnosis not present

## 2023-07-21 DIAGNOSIS — F02A0 Dementia in other diseases classified elsewhere, mild, without behavioral disturbance, psychotic disturbance, mood disturbance, and anxiety: Secondary | ICD-10-CM | POA: Diagnosis not present

## 2023-07-21 DIAGNOSIS — K5900 Constipation, unspecified: Secondary | ICD-10-CM | POA: Diagnosis not present

## 2023-07-21 DIAGNOSIS — M3320 Polymyositis, organ involvement unspecified: Secondary | ICD-10-CM | POA: Diagnosis not present

## 2023-07-21 DIAGNOSIS — Z85828 Personal history of other malignant neoplasm of skin: Secondary | ICD-10-CM | POA: Diagnosis not present

## 2023-07-22 DIAGNOSIS — G20A1 Parkinson's disease without dyskinesia, without mention of fluctuations: Secondary | ICD-10-CM | POA: Diagnosis not present

## 2023-07-22 DIAGNOSIS — Z85828 Personal history of other malignant neoplasm of skin: Secondary | ICD-10-CM | POA: Diagnosis not present

## 2023-07-22 DIAGNOSIS — M3320 Polymyositis, organ involvement unspecified: Secondary | ICD-10-CM | POA: Diagnosis not present

## 2023-07-22 DIAGNOSIS — K5900 Constipation, unspecified: Secondary | ICD-10-CM | POA: Diagnosis not present

## 2023-07-22 DIAGNOSIS — F02A0 Dementia in other diseases classified elsewhere, mild, without behavioral disturbance, psychotic disturbance, mood disturbance, and anxiety: Secondary | ICD-10-CM | POA: Diagnosis not present

## 2023-07-23 DIAGNOSIS — J479 Bronchiectasis, uncomplicated: Secondary | ICD-10-CM | POA: Diagnosis not present

## 2023-07-23 DIAGNOSIS — J849 Interstitial pulmonary disease, unspecified: Secondary | ICD-10-CM | POA: Diagnosis not present

## 2023-07-28 DIAGNOSIS — K5900 Constipation, unspecified: Secondary | ICD-10-CM | POA: Diagnosis not present

## 2023-07-28 DIAGNOSIS — Z85828 Personal history of other malignant neoplasm of skin: Secondary | ICD-10-CM | POA: Diagnosis not present

## 2023-07-28 DIAGNOSIS — F02A0 Dementia in other diseases classified elsewhere, mild, without behavioral disturbance, psychotic disturbance, mood disturbance, and anxiety: Secondary | ICD-10-CM | POA: Diagnosis not present

## 2023-07-28 DIAGNOSIS — G20A1 Parkinson's disease without dyskinesia, without mention of fluctuations: Secondary | ICD-10-CM | POA: Diagnosis not present

## 2023-07-28 DIAGNOSIS — M3320 Polymyositis, organ involvement unspecified: Secondary | ICD-10-CM | POA: Diagnosis not present

## 2023-07-29 DIAGNOSIS — G20A1 Parkinson's disease without dyskinesia, without mention of fluctuations: Secondary | ICD-10-CM | POA: Diagnosis not present

## 2023-07-29 DIAGNOSIS — M3320 Polymyositis, organ involvement unspecified: Secondary | ICD-10-CM | POA: Diagnosis not present

## 2023-07-29 DIAGNOSIS — F02A0 Dementia in other diseases classified elsewhere, mild, without behavioral disturbance, psychotic disturbance, mood disturbance, and anxiety: Secondary | ICD-10-CM | POA: Diagnosis not present

## 2023-07-29 DIAGNOSIS — K5900 Constipation, unspecified: Secondary | ICD-10-CM | POA: Diagnosis not present

## 2023-07-29 DIAGNOSIS — Z85828 Personal history of other malignant neoplasm of skin: Secondary | ICD-10-CM | POA: Diagnosis not present

## 2023-08-02 DIAGNOSIS — G20A2 Parkinson's disease without dyskinesia, with fluctuations: Secondary | ICD-10-CM | POA: Diagnosis not present

## 2023-08-04 DIAGNOSIS — G20A2 Parkinson's disease without dyskinesia, with fluctuations: Secondary | ICD-10-CM | POA: Diagnosis not present

## 2023-08-09 DIAGNOSIS — G20A2 Parkinson's disease without dyskinesia, with fluctuations: Secondary | ICD-10-CM | POA: Diagnosis not present

## 2023-08-11 DIAGNOSIS — I3139 Other pericardial effusion (noninflammatory): Secondary | ICD-10-CM | POA: Diagnosis not present

## 2023-08-11 DIAGNOSIS — R222 Localized swelling, mass and lump, trunk: Secondary | ICD-10-CM | POA: Diagnosis not present

## 2023-08-11 DIAGNOSIS — J479 Bronchiectasis, uncomplicated: Secondary | ICD-10-CM | POA: Diagnosis not present

## 2023-08-16 DIAGNOSIS — G20A2 Parkinson's disease without dyskinesia, with fluctuations: Secondary | ICD-10-CM | POA: Diagnosis not present

## 2023-08-17 DIAGNOSIS — R42 Dizziness and giddiness: Secondary | ICD-10-CM | POA: Diagnosis not present

## 2023-08-17 DIAGNOSIS — G9389 Other specified disorders of brain: Secondary | ICD-10-CM | POA: Diagnosis not present

## 2023-08-17 DIAGNOSIS — I517 Cardiomegaly: Secondary | ICD-10-CM | POA: Diagnosis not present

## 2023-08-17 DIAGNOSIS — N390 Urinary tract infection, site not specified: Secondary | ICD-10-CM | POA: Diagnosis not present

## 2023-08-17 DIAGNOSIS — Z743 Need for continuous supervision: Secondary | ICD-10-CM | POA: Diagnosis not present

## 2023-08-17 DIAGNOSIS — Z79899 Other long term (current) drug therapy: Secondary | ICD-10-CM | POA: Diagnosis not present

## 2023-08-17 DIAGNOSIS — I771 Stricture of artery: Secondary | ICD-10-CM | POA: Diagnosis not present

## 2023-08-17 DIAGNOSIS — R55 Syncope and collapse: Secondary | ICD-10-CM | POA: Diagnosis not present

## 2023-08-17 DIAGNOSIS — G20C Parkinsonism, unspecified: Secondary | ICD-10-CM | POA: Diagnosis not present

## 2023-08-18 DIAGNOSIS — G20A2 Parkinson's disease without dyskinesia, with fluctuations: Secondary | ICD-10-CM | POA: Diagnosis not present

## 2023-08-21 DIAGNOSIS — J479 Bronchiectasis, uncomplicated: Secondary | ICD-10-CM | POA: Diagnosis not present

## 2023-08-21 DIAGNOSIS — J849 Interstitial pulmonary disease, unspecified: Secondary | ICD-10-CM | POA: Diagnosis not present

## 2023-08-23 DIAGNOSIS — G20A2 Parkinson's disease without dyskinesia, with fluctuations: Secondary | ICD-10-CM | POA: Diagnosis not present

## 2023-08-25 DIAGNOSIS — J8417 Interstitial lung disease with progressive fibrotic phenotype in diseases classified elsewhere: Secondary | ICD-10-CM | POA: Diagnosis not present

## 2023-08-25 DIAGNOSIS — M3322 Polymyositis with myopathy: Secondary | ICD-10-CM | POA: Diagnosis not present

## 2023-08-25 DIAGNOSIS — M150 Primary generalized (osteo)arthritis: Secondary | ICD-10-CM | POA: Diagnosis not present

## 2023-08-25 DIAGNOSIS — M3219 Other organ or system involvement in systemic lupus erythematosus: Secondary | ICD-10-CM | POA: Diagnosis not present

## 2023-08-25 DIAGNOSIS — E559 Vitamin D deficiency, unspecified: Secondary | ICD-10-CM | POA: Diagnosis not present

## 2023-08-25 DIAGNOSIS — Z79899 Other long term (current) drug therapy: Secondary | ICD-10-CM | POA: Diagnosis not present

## 2023-08-25 DIAGNOSIS — M810 Age-related osteoporosis without current pathological fracture: Secondary | ICD-10-CM | POA: Diagnosis not present

## 2023-08-25 DIAGNOSIS — G20A2 Parkinson's disease without dyskinesia, with fluctuations: Secondary | ICD-10-CM | POA: Diagnosis not present

## 2023-08-25 DIAGNOSIS — R5383 Other fatigue: Secondary | ICD-10-CM | POA: Diagnosis not present

## 2023-08-25 DIAGNOSIS — I7300 Raynaud's syndrome without gangrene: Secondary | ICD-10-CM | POA: Diagnosis not present

## 2023-08-25 DIAGNOSIS — N189 Chronic kidney disease, unspecified: Secondary | ICD-10-CM | POA: Diagnosis not present

## 2023-08-30 DIAGNOSIS — N401 Enlarged prostate with lower urinary tract symptoms: Secondary | ICD-10-CM | POA: Diagnosis not present

## 2023-08-30 DIAGNOSIS — G20A2 Parkinson's disease without dyskinesia, with fluctuations: Secondary | ICD-10-CM | POA: Diagnosis not present

## 2023-08-30 DIAGNOSIS — I10 Essential (primary) hypertension: Secondary | ICD-10-CM | POA: Diagnosis not present

## 2023-08-30 DIAGNOSIS — R351 Nocturia: Secondary | ICD-10-CM | POA: Diagnosis not present

## 2023-08-30 DIAGNOSIS — M3320 Polymyositis, organ involvement unspecified: Secondary | ICD-10-CM | POA: Diagnosis not present

## 2023-09-01 DIAGNOSIS — G20A2 Parkinson's disease without dyskinesia, with fluctuations: Secondary | ICD-10-CM | POA: Diagnosis not present

## 2023-09-03 DIAGNOSIS — J849 Interstitial pulmonary disease, unspecified: Secondary | ICD-10-CM | POA: Diagnosis not present

## 2023-09-08 DIAGNOSIS — G20A2 Parkinson's disease without dyskinesia, with fluctuations: Secondary | ICD-10-CM | POA: Diagnosis not present

## 2023-09-09 DIAGNOSIS — G20A2 Parkinson's disease without dyskinesia, with fluctuations: Secondary | ICD-10-CM | POA: Diagnosis not present

## 2023-09-15 DIAGNOSIS — G20A2 Parkinson's disease without dyskinesia, with fluctuations: Secondary | ICD-10-CM | POA: Diagnosis not present

## 2023-09-20 DIAGNOSIS — L570 Actinic keratosis: Secondary | ICD-10-CM | POA: Diagnosis not present

## 2023-09-20 DIAGNOSIS — L739 Follicular disorder, unspecified: Secondary | ICD-10-CM | POA: Diagnosis not present

## 2023-09-20 DIAGNOSIS — D485 Neoplasm of uncertain behavior of skin: Secondary | ICD-10-CM | POA: Diagnosis not present

## 2023-09-20 DIAGNOSIS — L723 Sebaceous cyst: Secondary | ICD-10-CM | POA: Diagnosis not present

## 2023-09-20 DIAGNOSIS — G20A2 Parkinson's disease without dyskinesia, with fluctuations: Secondary | ICD-10-CM | POA: Diagnosis not present

## 2023-09-21 DIAGNOSIS — J849 Interstitial pulmonary disease, unspecified: Secondary | ICD-10-CM | POA: Diagnosis not present

## 2023-09-21 DIAGNOSIS — D485 Neoplasm of uncertain behavior of skin: Secondary | ICD-10-CM | POA: Diagnosis not present

## 2023-09-21 DIAGNOSIS — M25522 Pain in left elbow: Secondary | ICD-10-CM | POA: Diagnosis not present

## 2023-09-21 DIAGNOSIS — J479 Bronchiectasis, uncomplicated: Secondary | ICD-10-CM | POA: Diagnosis not present

## 2023-09-22 DIAGNOSIS — G20A2 Parkinson's disease without dyskinesia, with fluctuations: Secondary | ICD-10-CM | POA: Diagnosis not present

## 2023-09-23 DIAGNOSIS — R3914 Feeling of incomplete bladder emptying: Secondary | ICD-10-CM | POA: Diagnosis not present

## 2023-09-23 DIAGNOSIS — N3941 Urge incontinence: Secondary | ICD-10-CM | POA: Diagnosis not present

## 2023-09-23 DIAGNOSIS — R972 Elevated prostate specific antigen [PSA]: Secondary | ICD-10-CM | POA: Diagnosis not present

## 2023-09-27 DIAGNOSIS — G20A2 Parkinson's disease without dyskinesia, with fluctuations: Secondary | ICD-10-CM | POA: Diagnosis not present

## 2023-09-29 DIAGNOSIS — G20A2 Parkinson's disease without dyskinesia, with fluctuations: Secondary | ICD-10-CM | POA: Diagnosis not present

## 2023-10-04 DIAGNOSIS — G20A2 Parkinson's disease without dyskinesia, with fluctuations: Secondary | ICD-10-CM | POA: Diagnosis not present

## 2023-10-06 DIAGNOSIS — G20A2 Parkinson's disease without dyskinesia, with fluctuations: Secondary | ICD-10-CM | POA: Diagnosis not present

## 2023-10-11 DIAGNOSIS — G20A2 Parkinson's disease without dyskinesia, with fluctuations: Secondary | ICD-10-CM | POA: Diagnosis not present

## 2023-10-13 DIAGNOSIS — G20A2 Parkinson's disease without dyskinesia, with fluctuations: Secondary | ICD-10-CM | POA: Diagnosis not present

## 2023-10-18 DIAGNOSIS — G20A2 Parkinson's disease without dyskinesia, with fluctuations: Secondary | ICD-10-CM | POA: Diagnosis not present

## 2023-10-20 DIAGNOSIS — G20A2 Parkinson's disease without dyskinesia, with fluctuations: Secondary | ICD-10-CM | POA: Diagnosis not present

## 2023-10-22 DIAGNOSIS — J849 Interstitial pulmonary disease, unspecified: Secondary | ICD-10-CM | POA: Diagnosis not present

## 2023-10-22 DIAGNOSIS — J479 Bronchiectasis, uncomplicated: Secondary | ICD-10-CM | POA: Diagnosis not present

## 2023-10-25 DIAGNOSIS — G20A2 Parkinson's disease without dyskinesia, with fluctuations: Secondary | ICD-10-CM | POA: Diagnosis not present

## 2023-10-27 DIAGNOSIS — G20A2 Parkinson's disease without dyskinesia, with fluctuations: Secondary | ICD-10-CM | POA: Diagnosis not present

## 2023-10-28 DIAGNOSIS — L739 Follicular disorder, unspecified: Secondary | ICD-10-CM | POA: Diagnosis not present

## 2023-11-01 DIAGNOSIS — G20A2 Parkinson's disease without dyskinesia, with fluctuations: Secondary | ICD-10-CM | POA: Diagnosis not present

## 2023-11-03 DIAGNOSIS — G20C Parkinsonism, unspecified: Secondary | ICD-10-CM | POA: Diagnosis not present

## 2023-11-04 DIAGNOSIS — R300 Dysuria: Secondary | ICD-10-CM | POA: Diagnosis not present

## 2023-11-04 DIAGNOSIS — N390 Urinary tract infection, site not specified: Secondary | ICD-10-CM | POA: Diagnosis not present

## 2023-11-09 DIAGNOSIS — L739 Follicular disorder, unspecified: Secondary | ICD-10-CM | POA: Diagnosis not present

## 2023-11-21 DIAGNOSIS — R21 Rash and other nonspecific skin eruption: Secondary | ICD-10-CM | POA: Diagnosis not present

## 2023-11-21 DIAGNOSIS — N400 Enlarged prostate without lower urinary tract symptoms: Secondary | ICD-10-CM | POA: Diagnosis not present

## 2023-11-21 DIAGNOSIS — I4891 Unspecified atrial fibrillation: Secondary | ICD-10-CM | POA: Diagnosis not present

## 2023-11-21 DIAGNOSIS — G20A1 Parkinson's disease without dyskinesia, without mention of fluctuations: Secondary | ICD-10-CM | POA: Diagnosis not present

## 2023-11-21 DIAGNOSIS — N136 Pyonephrosis: Secondary | ICD-10-CM | POA: Diagnosis not present

## 2023-11-21 DIAGNOSIS — D649 Anemia, unspecified: Secondary | ICD-10-CM | POA: Diagnosis not present

## 2023-11-21 DIAGNOSIS — E250 Congenital adrenogenital disorders associated with enzyme deficiency: Secondary | ICD-10-CM | POA: Diagnosis not present

## 2023-11-21 DIAGNOSIS — G9340 Encephalopathy, unspecified: Secondary | ICD-10-CM | POA: Diagnosis not present

## 2023-11-21 DIAGNOSIS — J9811 Atelectasis: Secondary | ICD-10-CM | POA: Diagnosis not present

## 2023-11-21 DIAGNOSIS — R1312 Dysphagia, oropharyngeal phase: Secondary | ICD-10-CM | POA: Diagnosis not present

## 2023-11-21 DIAGNOSIS — R4182 Altered mental status, unspecified: Secondary | ICD-10-CM | POA: Diagnosis not present

## 2023-11-21 DIAGNOSIS — J479 Bronchiectasis, uncomplicated: Secondary | ICD-10-CM | POA: Diagnosis not present

## 2023-11-21 DIAGNOSIS — U071 COVID-19: Secondary | ICD-10-CM | POA: Diagnosis not present

## 2023-11-21 DIAGNOSIS — N281 Cyst of kidney, acquired: Secondary | ICD-10-CM | POA: Diagnosis not present

## 2023-11-21 DIAGNOSIS — R10811 Right upper quadrant abdominal tenderness: Secondary | ICD-10-CM | POA: Diagnosis not present

## 2023-11-21 DIAGNOSIS — M069 Rheumatoid arthritis, unspecified: Secondary | ICD-10-CM | POA: Diagnosis not present

## 2023-11-21 DIAGNOSIS — I672 Cerebral atherosclerosis: Secondary | ICD-10-CM | POA: Diagnosis not present

## 2023-11-21 DIAGNOSIS — Z7409 Other reduced mobility: Secondary | ICD-10-CM | POA: Diagnosis not present

## 2023-11-21 DIAGNOSIS — I21A1 Myocardial infarction type 2: Secondary | ICD-10-CM | POA: Diagnosis not present

## 2023-11-21 DIAGNOSIS — N200 Calculus of kidney: Secondary | ICD-10-CM | POA: Diagnosis not present

## 2023-11-21 DIAGNOSIS — I700 Atherosclerosis of aorta: Secondary | ICD-10-CM | POA: Diagnosis not present

## 2023-11-21 DIAGNOSIS — R069 Unspecified abnormalities of breathing: Secondary | ICD-10-CM | POA: Diagnosis not present

## 2023-11-21 DIAGNOSIS — I2699 Other pulmonary embolism without acute cor pulmonale: Secondary | ICD-10-CM | POA: Diagnosis not present

## 2023-11-21 DIAGNOSIS — N201 Calculus of ureter: Secondary | ICD-10-CM | POA: Diagnosis not present

## 2023-11-21 DIAGNOSIS — F0390 Unspecified dementia without behavioral disturbance: Secondary | ICD-10-CM | POA: Diagnosis not present

## 2023-11-21 DIAGNOSIS — Z7901 Long term (current) use of anticoagulants: Secondary | ICD-10-CM | POA: Diagnosis not present

## 2023-11-21 DIAGNOSIS — R0602 Shortness of breath: Secondary | ICD-10-CM | POA: Diagnosis not present

## 2023-11-21 DIAGNOSIS — R41841 Cognitive communication deficit: Secondary | ICD-10-CM | POA: Diagnosis not present

## 2023-11-21 DIAGNOSIS — G9341 Metabolic encephalopathy: Secondary | ICD-10-CM | POA: Diagnosis not present

## 2023-11-21 DIAGNOSIS — D84821 Immunodeficiency due to drugs: Secondary | ICD-10-CM | POA: Diagnosis not present

## 2023-11-21 DIAGNOSIS — R748 Abnormal levels of other serum enzymes: Secondary | ICD-10-CM | POA: Diagnosis not present

## 2023-11-21 DIAGNOSIS — R079 Chest pain, unspecified: Secondary | ICD-10-CM | POA: Diagnosis not present

## 2023-11-21 DIAGNOSIS — R0902 Hypoxemia: Secondary | ICD-10-CM | POA: Diagnosis not present

## 2023-11-21 DIAGNOSIS — R404 Transient alteration of awareness: Secondary | ICD-10-CM | POA: Diagnosis not present

## 2023-11-21 DIAGNOSIS — N1330 Unspecified hydronephrosis: Secondary | ICD-10-CM | POA: Diagnosis not present

## 2023-11-21 DIAGNOSIS — F0282 Dementia in other diseases classified elsewhere, unspecified severity, with psychotic disturbance: Secondary | ICD-10-CM | POA: Diagnosis not present

## 2023-11-21 DIAGNOSIS — E7220 Disorder of urea cycle metabolism, unspecified: Secondary | ICD-10-CM | POA: Diagnosis not present

## 2023-11-21 DIAGNOSIS — E876 Hypokalemia: Secondary | ICD-10-CM | POA: Diagnosis not present

## 2023-11-21 DIAGNOSIS — M6281 Muscle weakness (generalized): Secondary | ICD-10-CM | POA: Diagnosis not present

## 2023-11-21 DIAGNOSIS — E46 Unspecified protein-calorie malnutrition: Secondary | ICD-10-CM | POA: Diagnosis not present

## 2023-11-21 DIAGNOSIS — J849 Interstitial pulmonary disease, unspecified: Secondary | ICD-10-CM | POA: Diagnosis not present

## 2023-11-21 DIAGNOSIS — J1282 Pneumonia due to coronavirus disease 2019: Secondary | ICD-10-CM | POA: Diagnosis not present

## 2023-11-21 DIAGNOSIS — D696 Thrombocytopenia, unspecified: Secondary | ICD-10-CM | POA: Diagnosis not present

## 2023-11-21 DIAGNOSIS — R7989 Other specified abnormal findings of blood chemistry: Secondary | ICD-10-CM | POA: Diagnosis not present

## 2023-11-22 DIAGNOSIS — J479 Bronchiectasis, uncomplicated: Secondary | ICD-10-CM | POA: Diagnosis not present

## 2023-11-22 DIAGNOSIS — J849 Interstitial pulmonary disease, unspecified: Secondary | ICD-10-CM | POA: Diagnosis not present

## 2023-11-25 DIAGNOSIS — Z7409 Other reduced mobility: Secondary | ICD-10-CM | POA: Diagnosis not present

## 2023-11-25 DIAGNOSIS — Z79899 Other long term (current) drug therapy: Secondary | ICD-10-CM | POA: Diagnosis not present

## 2023-11-25 DIAGNOSIS — N201 Calculus of ureter: Secondary | ICD-10-CM | POA: Diagnosis not present

## 2023-11-25 DIAGNOSIS — J849 Interstitial pulmonary disease, unspecified: Secondary | ICD-10-CM | POA: Diagnosis not present

## 2023-11-25 DIAGNOSIS — R41841 Cognitive communication deficit: Secondary | ICD-10-CM | POA: Diagnosis not present

## 2023-11-25 DIAGNOSIS — I2782 Chronic pulmonary embolism: Secondary | ICD-10-CM | POA: Diagnosis not present

## 2023-11-25 DIAGNOSIS — R069 Unspecified abnormalities of breathing: Secondary | ICD-10-CM | POA: Diagnosis not present

## 2023-11-25 DIAGNOSIS — F0390 Unspecified dementia without behavioral disturbance: Secondary | ICD-10-CM | POA: Diagnosis not present

## 2023-11-25 DIAGNOSIS — R270 Ataxia, unspecified: Secondary | ICD-10-CM | POA: Diagnosis not present

## 2023-11-25 DIAGNOSIS — E876 Hypokalemia: Secondary | ICD-10-CM | POA: Diagnosis not present

## 2023-11-25 DIAGNOSIS — I951 Orthostatic hypotension: Secondary | ICD-10-CM | POA: Diagnosis not present

## 2023-11-25 DIAGNOSIS — J479 Bronchiectasis, uncomplicated: Secondary | ICD-10-CM | POA: Diagnosis not present

## 2023-11-25 DIAGNOSIS — E250 Congenital adrenogenital disorders associated with enzyme deficiency: Secondary | ICD-10-CM | POA: Diagnosis not present

## 2023-11-25 DIAGNOSIS — U071 COVID-19: Secondary | ICD-10-CM | POA: Diagnosis not present

## 2023-11-25 DIAGNOSIS — E46 Unspecified protein-calorie malnutrition: Secondary | ICD-10-CM | POA: Diagnosis not present

## 2023-11-25 DIAGNOSIS — L739 Follicular disorder, unspecified: Secondary | ICD-10-CM | POA: Diagnosis not present

## 2023-11-25 DIAGNOSIS — R7989 Other specified abnormal findings of blood chemistry: Secondary | ICD-10-CM | POA: Diagnosis not present

## 2023-11-25 DIAGNOSIS — J1282 Pneumonia due to coronavirus disease 2019: Secondary | ICD-10-CM | POA: Diagnosis not present

## 2023-11-25 DIAGNOSIS — I2699 Other pulmonary embolism without acute cor pulmonale: Secondary | ICD-10-CM | POA: Diagnosis not present

## 2023-11-25 DIAGNOSIS — R634 Abnormal weight loss: Secondary | ICD-10-CM | POA: Diagnosis not present

## 2023-11-25 DIAGNOSIS — Z7901 Long term (current) use of anticoagulants: Secondary | ICD-10-CM | POA: Diagnosis not present

## 2023-11-25 DIAGNOSIS — Z9189 Other specified personal risk factors, not elsewhere classified: Secondary | ICD-10-CM | POA: Diagnosis not present

## 2023-11-25 DIAGNOSIS — Z87440 Personal history of urinary (tract) infections: Secondary | ICD-10-CM | POA: Diagnosis not present

## 2023-11-25 DIAGNOSIS — Z8701 Personal history of pneumonia (recurrent): Secondary | ICD-10-CM | POA: Diagnosis not present

## 2023-11-25 DIAGNOSIS — N200 Calculus of kidney: Secondary | ICD-10-CM | POA: Diagnosis not present

## 2023-11-25 DIAGNOSIS — Z993 Dependence on wheelchair: Secondary | ICD-10-CM | POA: Diagnosis not present

## 2023-11-25 DIAGNOSIS — T50905D Adverse effect of unspecified drugs, medicaments and biological substances, subsequent encounter: Secondary | ICD-10-CM | POA: Diagnosis not present

## 2023-11-25 DIAGNOSIS — I482 Chronic atrial fibrillation, unspecified: Secondary | ICD-10-CM | POA: Diagnosis not present

## 2023-11-25 DIAGNOSIS — N1330 Unspecified hydronephrosis: Secondary | ICD-10-CM | POA: Diagnosis not present

## 2023-11-25 DIAGNOSIS — N12 Tubulo-interstitial nephritis, not specified as acute or chronic: Secondary | ICD-10-CM | POA: Diagnosis not present

## 2023-11-25 DIAGNOSIS — D84821 Immunodeficiency due to drugs: Secondary | ICD-10-CM | POA: Diagnosis not present

## 2023-11-25 DIAGNOSIS — R748 Abnormal levels of other serum enzymes: Secondary | ICD-10-CM | POA: Diagnosis not present

## 2023-11-25 DIAGNOSIS — R1312 Dysphagia, oropharyngeal phase: Secondary | ICD-10-CM | POA: Diagnosis not present

## 2023-11-25 DIAGNOSIS — M069 Rheumatoid arthritis, unspecified: Secondary | ICD-10-CM | POA: Diagnosis not present

## 2023-11-25 DIAGNOSIS — Z86711 Personal history of pulmonary embolism: Secondary | ICD-10-CM | POA: Diagnosis not present

## 2023-11-25 DIAGNOSIS — N400 Enlarged prostate without lower urinary tract symptoms: Secondary | ICD-10-CM | POA: Diagnosis not present

## 2023-11-25 DIAGNOSIS — M6281 Muscle weakness (generalized): Secondary | ICD-10-CM | POA: Diagnosis not present

## 2023-11-25 DIAGNOSIS — Z6823 Body mass index (BMI) 23.0-23.9, adult: Secondary | ICD-10-CM | POA: Diagnosis not present

## 2023-11-25 DIAGNOSIS — R21 Rash and other nonspecific skin eruption: Secondary | ICD-10-CM | POA: Diagnosis not present

## 2023-11-25 DIAGNOSIS — F0280 Dementia in other diseases classified elsewhere without behavioral disturbance: Secondary | ICD-10-CM | POA: Diagnosis not present

## 2023-11-25 DIAGNOSIS — R5381 Other malaise: Secondary | ICD-10-CM | POA: Diagnosis not present

## 2023-11-25 DIAGNOSIS — E7220 Disorder of urea cycle metabolism, unspecified: Secondary | ICD-10-CM | POA: Diagnosis not present

## 2023-11-25 DIAGNOSIS — G20A1 Parkinson's disease without dyskinesia, without mention of fluctuations: Secondary | ICD-10-CM | POA: Diagnosis not present

## 2023-11-26 DIAGNOSIS — Z87440 Personal history of urinary (tract) infections: Secondary | ICD-10-CM | POA: Diagnosis not present

## 2023-11-26 DIAGNOSIS — F0280 Dementia in other diseases classified elsewhere without behavioral disturbance: Secondary | ICD-10-CM | POA: Diagnosis not present

## 2023-11-26 DIAGNOSIS — D84821 Immunodeficiency due to drugs: Secondary | ICD-10-CM | POA: Diagnosis not present

## 2023-11-26 DIAGNOSIS — U071 COVID-19: Secondary | ICD-10-CM | POA: Diagnosis not present

## 2023-11-26 DIAGNOSIS — G20A1 Parkinson's disease without dyskinesia, without mention of fluctuations: Secondary | ICD-10-CM | POA: Diagnosis not present

## 2023-11-26 DIAGNOSIS — J1282 Pneumonia due to coronavirus disease 2019: Secondary | ICD-10-CM | POA: Diagnosis not present

## 2023-11-26 DIAGNOSIS — N200 Calculus of kidney: Secondary | ICD-10-CM | POA: Diagnosis not present

## 2023-11-26 DIAGNOSIS — M6281 Muscle weakness (generalized): Secondary | ICD-10-CM | POA: Diagnosis not present

## 2023-11-26 DIAGNOSIS — Z9189 Other specified personal risk factors, not elsewhere classified: Secondary | ICD-10-CM | POA: Diagnosis not present

## 2023-11-26 DIAGNOSIS — I2782 Chronic pulmonary embolism: Secondary | ICD-10-CM | POA: Diagnosis not present

## 2023-11-26 DIAGNOSIS — M069 Rheumatoid arthritis, unspecified: Secondary | ICD-10-CM | POA: Diagnosis not present

## 2023-11-26 DIAGNOSIS — N1330 Unspecified hydronephrosis: Secondary | ICD-10-CM | POA: Diagnosis not present

## 2023-11-30 DIAGNOSIS — Z6823 Body mass index (BMI) 23.0-23.9, adult: Secondary | ICD-10-CM | POA: Diagnosis not present

## 2023-11-30 DIAGNOSIS — E46 Unspecified protein-calorie malnutrition: Secondary | ICD-10-CM | POA: Diagnosis not present

## 2023-11-30 DIAGNOSIS — Z8701 Personal history of pneumonia (recurrent): Secondary | ICD-10-CM | POA: Diagnosis not present

## 2023-11-30 DIAGNOSIS — Z7901 Long term (current) use of anticoagulants: Secondary | ICD-10-CM | POA: Diagnosis not present

## 2023-11-30 DIAGNOSIS — R270 Ataxia, unspecified: Secondary | ICD-10-CM | POA: Diagnosis not present

## 2023-11-30 DIAGNOSIS — U071 COVID-19: Secondary | ICD-10-CM | POA: Diagnosis not present

## 2023-11-30 DIAGNOSIS — R5381 Other malaise: Secondary | ICD-10-CM | POA: Diagnosis not present

## 2023-11-30 DIAGNOSIS — N12 Tubulo-interstitial nephritis, not specified as acute or chronic: Secondary | ICD-10-CM | POA: Diagnosis not present

## 2023-11-30 DIAGNOSIS — Z86711 Personal history of pulmonary embolism: Secondary | ICD-10-CM | POA: Diagnosis not present

## 2023-11-30 DIAGNOSIS — Z993 Dependence on wheelchair: Secondary | ICD-10-CM | POA: Diagnosis not present

## 2023-11-30 DIAGNOSIS — I482 Chronic atrial fibrillation, unspecified: Secondary | ICD-10-CM | POA: Diagnosis not present

## 2023-11-30 DIAGNOSIS — G20A1 Parkinson's disease without dyskinesia, without mention of fluctuations: Secondary | ICD-10-CM | POA: Diagnosis not present

## 2023-12-01 DIAGNOSIS — G20A1 Parkinson's disease without dyskinesia, without mention of fluctuations: Secondary | ICD-10-CM | POA: Diagnosis not present

## 2023-12-01 DIAGNOSIS — F0280 Dementia in other diseases classified elsewhere without behavioral disturbance: Secondary | ICD-10-CM | POA: Diagnosis not present

## 2023-12-01 DIAGNOSIS — I482 Chronic atrial fibrillation, unspecified: Secondary | ICD-10-CM | POA: Diagnosis not present

## 2023-12-01 DIAGNOSIS — R270 Ataxia, unspecified: Secondary | ICD-10-CM | POA: Diagnosis not present

## 2023-12-03 DIAGNOSIS — I951 Orthostatic hypotension: Secondary | ICD-10-CM | POA: Diagnosis not present

## 2023-12-03 DIAGNOSIS — N400 Enlarged prostate without lower urinary tract symptoms: Secondary | ICD-10-CM | POA: Diagnosis not present

## 2023-12-03 DIAGNOSIS — G20A1 Parkinson's disease without dyskinesia, without mention of fluctuations: Secondary | ICD-10-CM | POA: Diagnosis not present

## 2023-12-06 DIAGNOSIS — M069 Rheumatoid arthritis, unspecified: Secondary | ICD-10-CM | POA: Diagnosis not present

## 2023-12-06 DIAGNOSIS — I951 Orthostatic hypotension: Secondary | ICD-10-CM | POA: Diagnosis not present

## 2023-12-06 DIAGNOSIS — G20A1 Parkinson's disease without dyskinesia, without mention of fluctuations: Secondary | ICD-10-CM | POA: Diagnosis not present

## 2023-12-08 DIAGNOSIS — I2782 Chronic pulmonary embolism: Secondary | ICD-10-CM | POA: Diagnosis not present

## 2023-12-08 DIAGNOSIS — G20A1 Parkinson's disease without dyskinesia, without mention of fluctuations: Secondary | ICD-10-CM | POA: Diagnosis not present

## 2023-12-08 DIAGNOSIS — I951 Orthostatic hypotension: Secondary | ICD-10-CM | POA: Diagnosis not present

## 2023-12-15 DIAGNOSIS — G20A1 Parkinson's disease without dyskinesia, without mention of fluctuations: Secondary | ICD-10-CM | POA: Diagnosis not present

## 2023-12-15 DIAGNOSIS — N400 Enlarged prostate without lower urinary tract symptoms: Secondary | ICD-10-CM | POA: Diagnosis not present

## 2023-12-15 DIAGNOSIS — F0280 Dementia in other diseases classified elsewhere without behavioral disturbance: Secondary | ICD-10-CM | POA: Diagnosis not present

## 2023-12-21 DIAGNOSIS — L739 Follicular disorder, unspecified: Secondary | ICD-10-CM | POA: Diagnosis not present

## 2023-12-23 DIAGNOSIS — J479 Bronchiectasis, uncomplicated: Secondary | ICD-10-CM | POA: Diagnosis not present

## 2023-12-23 DIAGNOSIS — J849 Interstitial pulmonary disease, unspecified: Secondary | ICD-10-CM | POA: Diagnosis not present

## 2023-12-28 DIAGNOSIS — I2782 Chronic pulmonary embolism: Secondary | ICD-10-CM | POA: Diagnosis not present

## 2023-12-28 DIAGNOSIS — T50905D Adverse effect of unspecified drugs, medicaments and biological substances, subsequent encounter: Secondary | ICD-10-CM | POA: Diagnosis not present

## 2023-12-28 DIAGNOSIS — Z7901 Long term (current) use of anticoagulants: Secondary | ICD-10-CM | POA: Diagnosis not present

## 2023-12-28 DIAGNOSIS — L739 Follicular disorder, unspecified: Secondary | ICD-10-CM | POA: Diagnosis not present

## 2023-12-28 DIAGNOSIS — D84821 Immunodeficiency due to drugs: Secondary | ICD-10-CM | POA: Diagnosis not present

## 2023-12-28 DIAGNOSIS — M069 Rheumatoid arthritis, unspecified: Secondary | ICD-10-CM | POA: Diagnosis not present

## 2023-12-31 DIAGNOSIS — I951 Orthostatic hypotension: Secondary | ICD-10-CM | POA: Diagnosis not present

## 2023-12-31 DIAGNOSIS — G20A1 Parkinson's disease without dyskinesia, without mention of fluctuations: Secondary | ICD-10-CM | POA: Diagnosis not present

## 2023-12-31 DIAGNOSIS — R634 Abnormal weight loss: Secondary | ICD-10-CM | POA: Diagnosis not present

## 2024-01-04 DIAGNOSIS — R634 Abnormal weight loss: Secondary | ICD-10-CM | POA: Diagnosis not present

## 2024-01-04 DIAGNOSIS — G20A1 Parkinson's disease without dyskinesia, without mention of fluctuations: Secondary | ICD-10-CM | POA: Diagnosis not present

## 2024-01-04 DIAGNOSIS — N400 Enlarged prostate without lower urinary tract symptoms: Secondary | ICD-10-CM | POA: Diagnosis not present

## 2024-01-06 DIAGNOSIS — F0390 Unspecified dementia without behavioral disturbance: Secondary | ICD-10-CM | POA: Diagnosis not present

## 2024-01-06 DIAGNOSIS — L739 Follicular disorder, unspecified: Secondary | ICD-10-CM | POA: Diagnosis not present

## 2024-01-06 DIAGNOSIS — Z7901 Long term (current) use of anticoagulants: Secondary | ICD-10-CM | POA: Diagnosis not present

## 2024-01-06 DIAGNOSIS — I2782 Chronic pulmonary embolism: Secondary | ICD-10-CM | POA: Diagnosis not present

## 2024-01-10 DIAGNOSIS — F0280 Dementia in other diseases classified elsewhere without behavioral disturbance: Secondary | ICD-10-CM | POA: Diagnosis not present

## 2024-01-10 DIAGNOSIS — M069 Rheumatoid arthritis, unspecified: Secondary | ICD-10-CM | POA: Diagnosis not present

## 2024-01-10 DIAGNOSIS — Z7901 Long term (current) use of anticoagulants: Secondary | ICD-10-CM | POA: Diagnosis not present

## 2024-01-10 DIAGNOSIS — L739 Follicular disorder, unspecified: Secondary | ICD-10-CM | POA: Diagnosis not present

## 2024-01-10 DIAGNOSIS — I2782 Chronic pulmonary embolism: Secondary | ICD-10-CM | POA: Diagnosis not present

## 2024-01-10 DIAGNOSIS — N400 Enlarged prostate without lower urinary tract symptoms: Secondary | ICD-10-CM | POA: Diagnosis not present

## 2024-01-10 DIAGNOSIS — G20A1 Parkinson's disease without dyskinesia, without mention of fluctuations: Secondary | ICD-10-CM | POA: Diagnosis not present

## 2024-01-10 DIAGNOSIS — I951 Orthostatic hypotension: Secondary | ICD-10-CM | POA: Diagnosis not present

## 2024-01-19 DIAGNOSIS — M62552 Muscle wasting and atrophy, not elsewhere classified, left thigh: Secondary | ICD-10-CM | POA: Diagnosis not present

## 2024-01-19 DIAGNOSIS — M6259 Muscle wasting and atrophy, not elsewhere classified, multiple sites: Secondary | ICD-10-CM | POA: Diagnosis not present

## 2024-01-19 DIAGNOSIS — R2681 Unsteadiness on feet: Secondary | ICD-10-CM | POA: Diagnosis not present

## 2024-01-19 DIAGNOSIS — M62551 Muscle wasting and atrophy, not elsewhere classified, right thigh: Secondary | ICD-10-CM | POA: Diagnosis not present

## 2024-01-19 DIAGNOSIS — R278 Other lack of coordination: Secondary | ICD-10-CM | POA: Diagnosis not present

## 2024-01-20 DIAGNOSIS — I480 Paroxysmal atrial fibrillation: Secondary | ICD-10-CM | POA: Diagnosis present

## 2024-01-20 DIAGNOSIS — E8729 Other acidosis: Secondary | ICD-10-CM | POA: Diagnosis present

## 2024-01-20 DIAGNOSIS — J189 Pneumonia, unspecified organism: Secondary | ICD-10-CM | POA: Diagnosis not present

## 2024-01-20 DIAGNOSIS — R1312 Dysphagia, oropharyngeal phase: Secondary | ICD-10-CM | POA: Diagnosis not present

## 2024-01-20 DIAGNOSIS — M3320 Polymyositis, organ involvement unspecified: Secondary | ICD-10-CM | POA: Diagnosis present

## 2024-01-20 DIAGNOSIS — R4189 Other symptoms and signs involving cognitive functions and awareness: Secondary | ICD-10-CM | POA: Diagnosis not present

## 2024-01-20 DIAGNOSIS — E860 Dehydration: Secondary | ICD-10-CM | POA: Diagnosis present

## 2024-01-20 DIAGNOSIS — F0280 Dementia in other diseases classified elsewhere without behavioral disturbance: Secondary | ICD-10-CM | POA: Diagnosis present

## 2024-01-20 DIAGNOSIS — I951 Orthostatic hypotension: Secondary | ICD-10-CM | POA: Diagnosis present

## 2024-01-20 DIAGNOSIS — I493 Ventricular premature depolarization: Secondary | ICD-10-CM | POA: Diagnosis present

## 2024-01-20 DIAGNOSIS — R4182 Altered mental status, unspecified: Secondary | ICD-10-CM | POA: Diagnosis not present

## 2024-01-20 DIAGNOSIS — R918 Other nonspecific abnormal finding of lung field: Secondary | ICD-10-CM | POA: Diagnosis not present

## 2024-01-20 DIAGNOSIS — J690 Pneumonitis due to inhalation of food and vomit: Secondary | ICD-10-CM | POA: Diagnosis not present

## 2024-01-20 DIAGNOSIS — R4020 Unspecified coma: Secondary | ICD-10-CM | POA: Diagnosis not present

## 2024-01-20 DIAGNOSIS — G928 Other toxic encephalopathy: Secondary | ICD-10-CM | POA: Diagnosis present

## 2024-01-20 DIAGNOSIS — I5189 Other ill-defined heart diseases: Secondary | ICD-10-CM | POA: Diagnosis not present

## 2024-01-20 DIAGNOSIS — G20A1 Parkinson's disease without dyskinesia, without mention of fluctuations: Secondary | ICD-10-CM | POA: Diagnosis not present

## 2024-01-20 DIAGNOSIS — R1310 Dysphagia, unspecified: Secondary | ICD-10-CM | POA: Diagnosis not present

## 2024-01-20 DIAGNOSIS — E876 Hypokalemia: Secondary | ICD-10-CM | POA: Diagnosis not present

## 2024-01-20 DIAGNOSIS — Z7901 Long term (current) use of anticoagulants: Secondary | ICD-10-CM | POA: Diagnosis not present

## 2024-01-20 DIAGNOSIS — R55 Syncope and collapse: Secondary | ICD-10-CM | POA: Diagnosis not present

## 2024-01-20 DIAGNOSIS — I959 Hypotension, unspecified: Secondary | ICD-10-CM | POA: Diagnosis not present

## 2024-01-28 DIAGNOSIS — G928 Other toxic encephalopathy: Secondary | ICD-10-CM | POA: Diagnosis not present

## 2024-01-28 DIAGNOSIS — G20C Parkinsonism, unspecified: Secondary | ICD-10-CM | POA: Diagnosis not present

## 2024-01-28 DIAGNOSIS — R531 Weakness: Secondary | ICD-10-CM | POA: Diagnosis not present

## 2024-01-28 DIAGNOSIS — R2689 Other abnormalities of gait and mobility: Secondary | ICD-10-CM | POA: Diagnosis not present

## 2024-01-28 DIAGNOSIS — I480 Paroxysmal atrial fibrillation: Secondary | ICD-10-CM | POA: Diagnosis not present

## 2024-01-28 DIAGNOSIS — Z7901 Long term (current) use of anticoagulants: Secondary | ICD-10-CM | POA: Diagnosis not present

## 2024-01-28 DIAGNOSIS — N401 Enlarged prostate with lower urinary tract symptoms: Secondary | ICD-10-CM | POA: Diagnosis not present

## 2024-01-28 DIAGNOSIS — M3320 Polymyositis, organ involvement unspecified: Secondary | ICD-10-CM | POA: Diagnosis not present

## 2024-01-28 DIAGNOSIS — Z20822 Contact with and (suspected) exposure to covid-19: Secondary | ICD-10-CM | POA: Diagnosis not present

## 2024-01-28 DIAGNOSIS — G4700 Insomnia, unspecified: Secondary | ICD-10-CM | POA: Diagnosis not present

## 2024-01-28 DIAGNOSIS — I10 Essential (primary) hypertension: Secondary | ICD-10-CM | POA: Diagnosis not present

## 2024-01-28 DIAGNOSIS — R471 Dysarthria and anarthria: Secondary | ICD-10-CM | POA: Diagnosis not present

## 2024-01-28 DIAGNOSIS — R1312 Dysphagia, oropharyngeal phase: Secondary | ICD-10-CM | POA: Diagnosis not present

## 2024-01-28 DIAGNOSIS — Z515 Encounter for palliative care: Secondary | ICD-10-CM | POA: Diagnosis not present

## 2024-01-28 DIAGNOSIS — G20A1 Parkinson's disease without dyskinesia, without mention of fluctuations: Secondary | ICD-10-CM | POA: Diagnosis not present

## 2024-01-28 DIAGNOSIS — R4189 Other symptoms and signs involving cognitive functions and awareness: Secondary | ICD-10-CM | POA: Diagnosis not present

## 2024-01-28 DIAGNOSIS — R509 Fever, unspecified: Secondary | ICD-10-CM | POA: Diagnosis not present

## 2024-01-28 DIAGNOSIS — L03114 Cellulitis of left upper limb: Secondary | ICD-10-CM | POA: Diagnosis not present

## 2024-01-28 DIAGNOSIS — J690 Pneumonitis due to inhalation of food and vomit: Secondary | ICD-10-CM | POA: Diagnosis not present

## 2024-01-28 DIAGNOSIS — I9589 Other hypotension: Secondary | ICD-10-CM | POA: Diagnosis not present

## 2024-01-28 DIAGNOSIS — R55 Syncope and collapse: Secondary | ICD-10-CM | POA: Diagnosis not present

## 2024-01-28 DIAGNOSIS — E876 Hypokalemia: Secondary | ICD-10-CM | POA: Diagnosis not present

## 2024-01-28 DIAGNOSIS — R410 Disorientation, unspecified: Secondary | ICD-10-CM | POA: Diagnosis not present

## 2024-01-28 DIAGNOSIS — Z23 Encounter for immunization: Secondary | ICD-10-CM | POA: Diagnosis not present

## 2024-01-28 DIAGNOSIS — I951 Orthostatic hypotension: Secondary | ICD-10-CM | POA: Diagnosis not present

## 2024-02-23 DIAGNOSIS — J849 Interstitial pulmonary disease, unspecified: Secondary | ICD-10-CM | POA: Diagnosis not present

## 2024-02-23 DIAGNOSIS — J479 Bronchiectasis, uncomplicated: Secondary | ICD-10-CM | POA: Diagnosis not present

## 2024-02-25 DIAGNOSIS — R278 Other lack of coordination: Secondary | ICD-10-CM | POA: Diagnosis not present

## 2024-02-25 DIAGNOSIS — M62552 Muscle wasting and atrophy, not elsewhere classified, left thigh: Secondary | ICD-10-CM | POA: Diagnosis not present

## 2024-02-25 DIAGNOSIS — M62551 Muscle wasting and atrophy, not elsewhere classified, right thigh: Secondary | ICD-10-CM | POA: Diagnosis not present

## 2024-02-25 DIAGNOSIS — R2681 Unsteadiness on feet: Secondary | ICD-10-CM | POA: Diagnosis not present

## 2024-02-25 DIAGNOSIS — M6259 Muscle wasting and atrophy, not elsewhere classified, multiple sites: Secondary | ICD-10-CM | POA: Diagnosis not present

## 2024-02-28 DIAGNOSIS — R41841 Cognitive communication deficit: Secondary | ICD-10-CM | POA: Diagnosis not present

## 2024-02-28 DIAGNOSIS — G20A1 Parkinson's disease without dyskinesia, without mention of fluctuations: Secondary | ICD-10-CM | POA: Diagnosis not present

## 2024-02-28 DIAGNOSIS — R1310 Dysphagia, unspecified: Secondary | ICD-10-CM | POA: Diagnosis not present

## 2024-02-28 DIAGNOSIS — R1312 Dysphagia, oropharyngeal phase: Secondary | ICD-10-CM | POA: Diagnosis not present

## 2024-02-29 DIAGNOSIS — G20A1 Parkinson's disease without dyskinesia, without mention of fluctuations: Secondary | ICD-10-CM | POA: Diagnosis not present

## 2024-02-29 DIAGNOSIS — M6281 Muscle weakness (generalized): Secondary | ICD-10-CM | POA: Diagnosis not present

## 2024-03-01 DIAGNOSIS — G20A1 Parkinson's disease without dyskinesia, without mention of fluctuations: Secondary | ICD-10-CM | POA: Diagnosis not present

## 2024-03-01 DIAGNOSIS — M6281 Muscle weakness (generalized): Secondary | ICD-10-CM | POA: Diagnosis not present

## 2024-03-02 DIAGNOSIS — I7300 Raynaud's syndrome without gangrene: Secondary | ICD-10-CM | POA: Diagnosis not present

## 2024-03-02 DIAGNOSIS — M810 Age-related osteoporosis without current pathological fracture: Secondary | ICD-10-CM | POA: Diagnosis not present

## 2024-03-02 DIAGNOSIS — M150 Primary generalized (osteo)arthritis: Secondary | ICD-10-CM | POA: Diagnosis not present

## 2024-03-02 DIAGNOSIS — E559 Vitamin D deficiency, unspecified: Secondary | ICD-10-CM | POA: Diagnosis not present

## 2024-03-02 DIAGNOSIS — G20A1 Parkinson's disease without dyskinesia, without mention of fluctuations: Secondary | ICD-10-CM | POA: Diagnosis not present

## 2024-03-02 DIAGNOSIS — M3322 Polymyositis with myopathy: Secondary | ICD-10-CM | POA: Diagnosis not present

## 2024-03-02 DIAGNOSIS — J8417 Interstitial lung disease with progressive fibrotic phenotype in diseases classified elsewhere: Secondary | ICD-10-CM | POA: Diagnosis not present

## 2024-03-02 DIAGNOSIS — M6281 Muscle weakness (generalized): Secondary | ICD-10-CM | POA: Diagnosis not present

## 2024-03-02 DIAGNOSIS — Z79899 Other long term (current) drug therapy: Secondary | ICD-10-CM | POA: Diagnosis not present

## 2024-03-02 DIAGNOSIS — N189 Chronic kidney disease, unspecified: Secondary | ICD-10-CM | POA: Diagnosis not present

## 2024-03-02 DIAGNOSIS — M3219 Other organ or system involvement in systemic lupus erythematosus: Secondary | ICD-10-CM | POA: Diagnosis not present

## 2024-03-03 DIAGNOSIS — G20A1 Parkinson's disease without dyskinesia, without mention of fluctuations: Secondary | ICD-10-CM | POA: Diagnosis not present

## 2024-03-03 DIAGNOSIS — R1310 Dysphagia, unspecified: Secondary | ICD-10-CM | POA: Diagnosis not present

## 2024-03-03 DIAGNOSIS — R1312 Dysphagia, oropharyngeal phase: Secondary | ICD-10-CM | POA: Diagnosis not present

## 2024-03-03 DIAGNOSIS — R41841 Cognitive communication deficit: Secondary | ICD-10-CM | POA: Diagnosis not present

## 2024-03-06 DIAGNOSIS — M6259 Muscle wasting and atrophy, not elsewhere classified, multiple sites: Secondary | ICD-10-CM | POA: Diagnosis not present

## 2024-03-06 DIAGNOSIS — M62552 Muscle wasting and atrophy, not elsewhere classified, left thigh: Secondary | ICD-10-CM | POA: Diagnosis not present

## 2024-03-06 DIAGNOSIS — R1310 Dysphagia, unspecified: Secondary | ICD-10-CM | POA: Diagnosis not present

## 2024-03-06 DIAGNOSIS — M62551 Muscle wasting and atrophy, not elsewhere classified, right thigh: Secondary | ICD-10-CM | POA: Diagnosis not present

## 2024-03-06 DIAGNOSIS — R41841 Cognitive communication deficit: Secondary | ICD-10-CM | POA: Diagnosis not present

## 2024-03-06 DIAGNOSIS — G20A1 Parkinson's disease without dyskinesia, without mention of fluctuations: Secondary | ICD-10-CM | POA: Diagnosis not present

## 2024-03-06 DIAGNOSIS — R278 Other lack of coordination: Secondary | ICD-10-CM | POA: Diagnosis not present

## 2024-03-06 DIAGNOSIS — R2681 Unsteadiness on feet: Secondary | ICD-10-CM | POA: Diagnosis not present

## 2024-03-06 DIAGNOSIS — R1312 Dysphagia, oropharyngeal phase: Secondary | ICD-10-CM | POA: Diagnosis not present

## 2024-03-07 DIAGNOSIS — M62552 Muscle wasting and atrophy, not elsewhere classified, left thigh: Secondary | ICD-10-CM | POA: Diagnosis not present

## 2024-03-07 DIAGNOSIS — R278 Other lack of coordination: Secondary | ICD-10-CM | POA: Diagnosis not present

## 2024-03-07 DIAGNOSIS — R2681 Unsteadiness on feet: Secondary | ICD-10-CM | POA: Diagnosis not present

## 2024-03-07 DIAGNOSIS — M62551 Muscle wasting and atrophy, not elsewhere classified, right thigh: Secondary | ICD-10-CM | POA: Diagnosis not present

## 2024-03-07 DIAGNOSIS — M6259 Muscle wasting and atrophy, not elsewhere classified, multiple sites: Secondary | ICD-10-CM | POA: Diagnosis not present

## 2024-03-07 DIAGNOSIS — G20C Parkinsonism, unspecified: Secondary | ICD-10-CM | POA: Diagnosis not present

## 2024-03-07 DIAGNOSIS — M6281 Muscle weakness (generalized): Secondary | ICD-10-CM | POA: Diagnosis not present

## 2024-03-07 DIAGNOSIS — G20A1 Parkinson's disease without dyskinesia, without mention of fluctuations: Secondary | ICD-10-CM | POA: Diagnosis not present

## 2024-03-08 DIAGNOSIS — M6281 Muscle weakness (generalized): Secondary | ICD-10-CM | POA: Diagnosis not present

## 2024-03-08 DIAGNOSIS — M62552 Muscle wasting and atrophy, not elsewhere classified, left thigh: Secondary | ICD-10-CM | POA: Diagnosis not present

## 2024-03-08 DIAGNOSIS — G20A1 Parkinson's disease without dyskinesia, without mention of fluctuations: Secondary | ICD-10-CM | POA: Diagnosis not present

## 2024-03-08 DIAGNOSIS — R2681 Unsteadiness on feet: Secondary | ICD-10-CM | POA: Diagnosis not present

## 2024-03-08 DIAGNOSIS — M6259 Muscle wasting and atrophy, not elsewhere classified, multiple sites: Secondary | ICD-10-CM | POA: Diagnosis not present

## 2024-03-08 DIAGNOSIS — M62551 Muscle wasting and atrophy, not elsewhere classified, right thigh: Secondary | ICD-10-CM | POA: Diagnosis not present

## 2024-03-08 DIAGNOSIS — R278 Other lack of coordination: Secondary | ICD-10-CM | POA: Diagnosis not present

## 2024-03-09 DIAGNOSIS — R2681 Unsteadiness on feet: Secondary | ICD-10-CM | POA: Diagnosis not present

## 2024-03-09 DIAGNOSIS — M6259 Muscle wasting and atrophy, not elsewhere classified, multiple sites: Secondary | ICD-10-CM | POA: Diagnosis not present

## 2024-03-09 DIAGNOSIS — M62552 Muscle wasting and atrophy, not elsewhere classified, left thigh: Secondary | ICD-10-CM | POA: Diagnosis not present

## 2024-03-09 DIAGNOSIS — M62551 Muscle wasting and atrophy, not elsewhere classified, right thigh: Secondary | ICD-10-CM | POA: Diagnosis not present

## 2024-03-09 DIAGNOSIS — R278 Other lack of coordination: Secondary | ICD-10-CM | POA: Diagnosis not present

## 2024-03-10 DIAGNOSIS — M6259 Muscle wasting and atrophy, not elsewhere classified, multiple sites: Secondary | ICD-10-CM | POA: Diagnosis not present

## 2024-03-10 DIAGNOSIS — G20A1 Parkinson's disease without dyskinesia, without mention of fluctuations: Secondary | ICD-10-CM | POA: Diagnosis not present

## 2024-03-10 DIAGNOSIS — R1312 Dysphagia, oropharyngeal phase: Secondary | ICD-10-CM | POA: Diagnosis not present

## 2024-03-10 DIAGNOSIS — R1310 Dysphagia, unspecified: Secondary | ICD-10-CM | POA: Diagnosis not present

## 2024-03-10 DIAGNOSIS — M6281 Muscle weakness (generalized): Secondary | ICD-10-CM | POA: Diagnosis not present

## 2024-03-10 DIAGNOSIS — R41841 Cognitive communication deficit: Secondary | ICD-10-CM | POA: Diagnosis not present

## 2024-03-10 DIAGNOSIS — M62551 Muscle wasting and atrophy, not elsewhere classified, right thigh: Secondary | ICD-10-CM | POA: Diagnosis not present

## 2024-03-10 DIAGNOSIS — M62552 Muscle wasting and atrophy, not elsewhere classified, left thigh: Secondary | ICD-10-CM | POA: Diagnosis not present

## 2024-03-10 DIAGNOSIS — R2681 Unsteadiness on feet: Secondary | ICD-10-CM | POA: Diagnosis not present

## 2024-03-10 DIAGNOSIS — R278 Other lack of coordination: Secondary | ICD-10-CM | POA: Diagnosis not present

## 2024-03-13 DIAGNOSIS — R278 Other lack of coordination: Secondary | ICD-10-CM | POA: Diagnosis not present

## 2024-03-13 DIAGNOSIS — N400 Enlarged prostate without lower urinary tract symptoms: Secondary | ICD-10-CM | POA: Diagnosis not present

## 2024-03-13 DIAGNOSIS — G20A1 Parkinson's disease without dyskinesia, without mention of fluctuations: Secondary | ICD-10-CM | POA: Diagnosis not present

## 2024-03-13 DIAGNOSIS — I951 Orthostatic hypotension: Secondary | ICD-10-CM | POA: Diagnosis not present

## 2024-03-13 DIAGNOSIS — R2681 Unsteadiness on feet: Secondary | ICD-10-CM | POA: Diagnosis not present

## 2024-03-13 DIAGNOSIS — K219 Gastro-esophageal reflux disease without esophagitis: Secondary | ICD-10-CM | POA: Diagnosis not present

## 2024-03-13 DIAGNOSIS — M6259 Muscle wasting and atrophy, not elsewhere classified, multiple sites: Secondary | ICD-10-CM | POA: Diagnosis not present

## 2024-03-13 DIAGNOSIS — I7300 Raynaud's syndrome without gangrene: Secondary | ICD-10-CM | POA: Diagnosis not present

## 2024-03-13 DIAGNOSIS — M62551 Muscle wasting and atrophy, not elsewhere classified, right thigh: Secondary | ICD-10-CM | POA: Diagnosis not present

## 2024-03-13 DIAGNOSIS — R41841 Cognitive communication deficit: Secondary | ICD-10-CM | POA: Diagnosis not present

## 2024-03-13 DIAGNOSIS — R1312 Dysphagia, oropharyngeal phase: Secondary | ICD-10-CM | POA: Diagnosis not present

## 2024-03-13 DIAGNOSIS — R1310 Dysphagia, unspecified: Secondary | ICD-10-CM | POA: Diagnosis not present

## 2024-03-13 DIAGNOSIS — M62552 Muscle wasting and atrophy, not elsewhere classified, left thigh: Secondary | ICD-10-CM | POA: Diagnosis not present

## 2024-03-13 DIAGNOSIS — E785 Hyperlipidemia, unspecified: Secondary | ICD-10-CM | POA: Diagnosis not present

## 2024-03-13 DIAGNOSIS — M1990 Unspecified osteoarthritis, unspecified site: Secondary | ICD-10-CM | POA: Diagnosis not present

## 2024-03-13 DIAGNOSIS — M6281 Muscle weakness (generalized): Secondary | ICD-10-CM | POA: Diagnosis not present

## 2024-03-13 DIAGNOSIS — I480 Paroxysmal atrial fibrillation: Secondary | ICD-10-CM | POA: Diagnosis not present

## 2024-03-15 DIAGNOSIS — R1310 Dysphagia, unspecified: Secondary | ICD-10-CM | POA: Diagnosis not present

## 2024-03-15 DIAGNOSIS — G20A1 Parkinson's disease without dyskinesia, without mention of fluctuations: Secondary | ICD-10-CM | POA: Diagnosis not present

## 2024-03-15 DIAGNOSIS — I951 Orthostatic hypotension: Secondary | ICD-10-CM | POA: Diagnosis not present

## 2024-03-15 DIAGNOSIS — M1990 Unspecified osteoarthritis, unspecified site: Secondary | ICD-10-CM | POA: Diagnosis not present

## 2024-03-15 DIAGNOSIS — K219 Gastro-esophageal reflux disease without esophagitis: Secondary | ICD-10-CM | POA: Diagnosis not present

## 2024-03-15 DIAGNOSIS — I480 Paroxysmal atrial fibrillation: Secondary | ICD-10-CM | POA: Diagnosis not present

## 2024-03-17 DIAGNOSIS — K219 Gastro-esophageal reflux disease without esophagitis: Secondary | ICD-10-CM | POA: Diagnosis not present

## 2024-03-17 DIAGNOSIS — G20A1 Parkinson's disease without dyskinesia, without mention of fluctuations: Secondary | ICD-10-CM | POA: Diagnosis not present

## 2024-03-17 DIAGNOSIS — R1310 Dysphagia, unspecified: Secondary | ICD-10-CM | POA: Diagnosis not present

## 2024-03-17 DIAGNOSIS — I951 Orthostatic hypotension: Secondary | ICD-10-CM | POA: Diagnosis not present

## 2024-03-17 DIAGNOSIS — I480 Paroxysmal atrial fibrillation: Secondary | ICD-10-CM | POA: Diagnosis not present

## 2024-03-17 DIAGNOSIS — M1990 Unspecified osteoarthritis, unspecified site: Secondary | ICD-10-CM | POA: Diagnosis not present

## 2024-03-20 DIAGNOSIS — K219 Gastro-esophageal reflux disease without esophagitis: Secondary | ICD-10-CM | POA: Diagnosis not present

## 2024-03-20 DIAGNOSIS — R1310 Dysphagia, unspecified: Secondary | ICD-10-CM | POA: Diagnosis not present

## 2024-03-20 DIAGNOSIS — G20A1 Parkinson's disease without dyskinesia, without mention of fluctuations: Secondary | ICD-10-CM | POA: Diagnosis not present

## 2024-03-20 DIAGNOSIS — I951 Orthostatic hypotension: Secondary | ICD-10-CM | POA: Diagnosis not present

## 2024-03-20 DIAGNOSIS — I7300 Raynaud's syndrome without gangrene: Secondary | ICD-10-CM | POA: Diagnosis not present

## 2024-03-20 DIAGNOSIS — M1990 Unspecified osteoarthritis, unspecified site: Secondary | ICD-10-CM | POA: Diagnosis not present

## 2024-03-20 DIAGNOSIS — N400 Enlarged prostate without lower urinary tract symptoms: Secondary | ICD-10-CM | POA: Diagnosis not present

## 2024-03-20 DIAGNOSIS — E785 Hyperlipidemia, unspecified: Secondary | ICD-10-CM | POA: Diagnosis not present

## 2024-03-20 DIAGNOSIS — I480 Paroxysmal atrial fibrillation: Secondary | ICD-10-CM | POA: Diagnosis not present

## 2024-03-21 DIAGNOSIS — I480 Paroxysmal atrial fibrillation: Secondary | ICD-10-CM | POA: Diagnosis not present

## 2024-03-21 DIAGNOSIS — M1990 Unspecified osteoarthritis, unspecified site: Secondary | ICD-10-CM | POA: Diagnosis not present

## 2024-03-21 DIAGNOSIS — R1310 Dysphagia, unspecified: Secondary | ICD-10-CM | POA: Diagnosis not present

## 2024-03-21 DIAGNOSIS — G20A1 Parkinson's disease without dyskinesia, without mention of fluctuations: Secondary | ICD-10-CM | POA: Diagnosis not present

## 2024-03-21 DIAGNOSIS — K219 Gastro-esophageal reflux disease without esophagitis: Secondary | ICD-10-CM | POA: Diagnosis not present

## 2024-03-21 DIAGNOSIS — I951 Orthostatic hypotension: Secondary | ICD-10-CM | POA: Diagnosis not present

## 2024-03-22 DIAGNOSIS — G20A1 Parkinson's disease without dyskinesia, without mention of fluctuations: Secondary | ICD-10-CM | POA: Diagnosis not present

## 2024-03-22 DIAGNOSIS — M1990 Unspecified osteoarthritis, unspecified site: Secondary | ICD-10-CM | POA: Diagnosis not present

## 2024-03-22 DIAGNOSIS — I951 Orthostatic hypotension: Secondary | ICD-10-CM | POA: Diagnosis not present

## 2024-03-22 DIAGNOSIS — R1310 Dysphagia, unspecified: Secondary | ICD-10-CM | POA: Diagnosis not present

## 2024-03-22 DIAGNOSIS — I480 Paroxysmal atrial fibrillation: Secondary | ICD-10-CM | POA: Diagnosis not present

## 2024-03-22 DIAGNOSIS — K219 Gastro-esophageal reflux disease without esophagitis: Secondary | ICD-10-CM | POA: Diagnosis not present

## 2024-03-23 DIAGNOSIS — I480 Paroxysmal atrial fibrillation: Secondary | ICD-10-CM | POA: Diagnosis not present

## 2024-03-23 DIAGNOSIS — M1990 Unspecified osteoarthritis, unspecified site: Secondary | ICD-10-CM | POA: Diagnosis not present

## 2024-03-23 DIAGNOSIS — R1310 Dysphagia, unspecified: Secondary | ICD-10-CM | POA: Diagnosis not present

## 2024-03-23 DIAGNOSIS — K219 Gastro-esophageal reflux disease without esophagitis: Secondary | ICD-10-CM | POA: Diagnosis not present

## 2024-03-23 DIAGNOSIS — I951 Orthostatic hypotension: Secondary | ICD-10-CM | POA: Diagnosis not present

## 2024-03-23 DIAGNOSIS — G20A1 Parkinson's disease without dyskinesia, without mention of fluctuations: Secondary | ICD-10-CM | POA: Diagnosis not present

## 2024-03-25 DIAGNOSIS — I951 Orthostatic hypotension: Secondary | ICD-10-CM | POA: Diagnosis not present

## 2024-03-25 DIAGNOSIS — K219 Gastro-esophageal reflux disease without esophagitis: Secondary | ICD-10-CM | POA: Diagnosis not present

## 2024-03-25 DIAGNOSIS — G20A1 Parkinson's disease without dyskinesia, without mention of fluctuations: Secondary | ICD-10-CM | POA: Diagnosis not present

## 2024-03-25 DIAGNOSIS — I480 Paroxysmal atrial fibrillation: Secondary | ICD-10-CM | POA: Diagnosis not present

## 2024-03-25 DIAGNOSIS — M1990 Unspecified osteoarthritis, unspecified site: Secondary | ICD-10-CM | POA: Diagnosis not present

## 2024-03-25 DIAGNOSIS — R1310 Dysphagia, unspecified: Secondary | ICD-10-CM | POA: Diagnosis not present

## 2024-03-26 DIAGNOSIS — R1310 Dysphagia, unspecified: Secondary | ICD-10-CM | POA: Diagnosis not present

## 2024-03-26 DIAGNOSIS — M1990 Unspecified osteoarthritis, unspecified site: Secondary | ICD-10-CM | POA: Diagnosis not present

## 2024-03-26 DIAGNOSIS — I480 Paroxysmal atrial fibrillation: Secondary | ICD-10-CM | POA: Diagnosis not present

## 2024-03-26 DIAGNOSIS — R509 Fever, unspecified: Secondary | ICD-10-CM | POA: Diagnosis not present

## 2024-03-26 DIAGNOSIS — K219 Gastro-esophageal reflux disease without esophagitis: Secondary | ICD-10-CM | POA: Diagnosis not present

## 2024-03-26 DIAGNOSIS — G20A1 Parkinson's disease without dyskinesia, without mention of fluctuations: Secondary | ICD-10-CM | POA: Diagnosis not present

## 2024-03-26 DIAGNOSIS — R296 Repeated falls: Secondary | ICD-10-CM | POA: Diagnosis not present

## 2024-03-26 DIAGNOSIS — I951 Orthostatic hypotension: Secondary | ICD-10-CM | POA: Diagnosis not present

## 2024-03-26 DIAGNOSIS — Z043 Encounter for examination and observation following other accident: Secondary | ICD-10-CM | POA: Diagnosis not present

## 2024-03-27 DIAGNOSIS — M1990 Unspecified osteoarthritis, unspecified site: Secondary | ICD-10-CM | POA: Diagnosis not present

## 2024-03-27 DIAGNOSIS — I951 Orthostatic hypotension: Secondary | ICD-10-CM | POA: Diagnosis not present

## 2024-03-27 DIAGNOSIS — K219 Gastro-esophageal reflux disease without esophagitis: Secondary | ICD-10-CM | POA: Diagnosis not present

## 2024-03-27 DIAGNOSIS — R1310 Dysphagia, unspecified: Secondary | ICD-10-CM | POA: Diagnosis not present

## 2024-03-27 DIAGNOSIS — I480 Paroxysmal atrial fibrillation: Secondary | ICD-10-CM | POA: Diagnosis not present

## 2024-03-27 DIAGNOSIS — G20A1 Parkinson's disease without dyskinesia, without mention of fluctuations: Secondary | ICD-10-CM | POA: Diagnosis not present

## 2024-03-28 DIAGNOSIS — E876 Hypokalemia: Secondary | ICD-10-CM | POA: Diagnosis not present

## 2024-03-28 DIAGNOSIS — R4182 Altered mental status, unspecified: Secondary | ICD-10-CM | POA: Diagnosis not present

## 2024-03-28 DIAGNOSIS — R55 Syncope and collapse: Secondary | ICD-10-CM | POA: Diagnosis not present

## 2024-03-28 DIAGNOSIS — R930 Abnormal findings on diagnostic imaging of skull and head, not elsewhere classified: Secondary | ICD-10-CM | POA: Diagnosis not present

## 2024-03-28 DIAGNOSIS — J9811 Atelectasis: Secondary | ICD-10-CM | POA: Diagnosis not present

## 2024-03-28 DIAGNOSIS — I4891 Unspecified atrial fibrillation: Secondary | ICD-10-CM | POA: Diagnosis not present

## 2024-03-28 DIAGNOSIS — I959 Hypotension, unspecified: Secondary | ICD-10-CM | POA: Diagnosis not present

## 2024-03-29 DIAGNOSIS — T17908A Unspecified foreign body in respiratory tract, part unspecified causing other injury, initial encounter: Secondary | ICD-10-CM | POA: Diagnosis not present

## 2024-03-29 DIAGNOSIS — Z7189 Other specified counseling: Secondary | ICD-10-CM | POA: Diagnosis not present

## 2024-03-29 DIAGNOSIS — Z515 Encounter for palliative care: Secondary | ICD-10-CM | POA: Diagnosis not present

## 2024-03-29 DIAGNOSIS — S52022A Displaced fracture of olecranon process without intraarticular extension of left ulna, initial encounter for closed fracture: Secondary | ICD-10-CM | POA: Diagnosis not present

## 2024-03-29 DIAGNOSIS — G20A1 Parkinson's disease without dyskinesia, without mention of fluctuations: Secondary | ICD-10-CM | POA: Diagnosis not present

## 2024-03-29 DIAGNOSIS — R55 Syncope and collapse: Secondary | ICD-10-CM | POA: Diagnosis not present

## 2024-03-29 DIAGNOSIS — I951 Orthostatic hypotension: Secondary | ICD-10-CM | POA: Diagnosis not present

## 2024-03-30 DIAGNOSIS — K7200 Acute and subacute hepatic failure without coma: Secondary | ICD-10-CM | POA: Diagnosis not present

## 2024-03-30 DIAGNOSIS — Z515 Encounter for palliative care: Secondary | ICD-10-CM | POA: Diagnosis not present

## 2024-03-30 DIAGNOSIS — R55 Syncope and collapse: Secondary | ICD-10-CM | POA: Diagnosis not present

## 2024-03-30 DIAGNOSIS — I951 Orthostatic hypotension: Secondary | ICD-10-CM | POA: Diagnosis not present

## 2024-03-30 DIAGNOSIS — R918 Other nonspecific abnormal finding of lung field: Secondary | ICD-10-CM | POA: Diagnosis not present

## 2024-03-30 DIAGNOSIS — I480 Paroxysmal atrial fibrillation: Secondary | ICD-10-CM | POA: Diagnosis not present

## 2024-03-30 DIAGNOSIS — G20A1 Parkinson's disease without dyskinesia, without mention of fluctuations: Secondary | ICD-10-CM | POA: Diagnosis not present

## 2024-03-30 DIAGNOSIS — Z66 Do not resuscitate: Secondary | ICD-10-CM | POA: Diagnosis not present

## 2024-03-31 DIAGNOSIS — R55 Syncope and collapse: Secondary | ICD-10-CM | POA: Diagnosis not present

## 2024-04-01 DIAGNOSIS — R55 Syncope and collapse: Secondary | ICD-10-CM | POA: Diagnosis not present
